# Patient Record
Sex: Female | Born: 1975 | Hispanic: Yes | Smoking: Current every day smoker
Health system: Southern US, Community
[De-identification: ages and names within clinical notes are randomized; demographics above are authoritative.]

## PROBLEM LIST (undated history)

## (undated) ENCOUNTER — Emergency Department (HOSPITAL_COMMUNITY): Admission: EM

## (undated) DIAGNOSIS — H409 Unspecified glaucoma: Secondary | ICD-10-CM

## (undated) DIAGNOSIS — F32A Depression, unspecified: Secondary | ICD-10-CM

## (undated) DIAGNOSIS — I10 Essential (primary) hypertension: Secondary | ICD-10-CM

## (undated) DIAGNOSIS — Z87442 Personal history of urinary calculi: Secondary | ICD-10-CM

## (undated) DIAGNOSIS — F319 Bipolar disorder, unspecified: Secondary | ICD-10-CM

## (undated) DIAGNOSIS — F419 Anxiety disorder, unspecified: Secondary | ICD-10-CM

## (undated) DIAGNOSIS — G43909 Migraine, unspecified, not intractable, without status migrainosus: Secondary | ICD-10-CM

## (undated) DIAGNOSIS — K219 Gastro-esophageal reflux disease without esophagitis: Secondary | ICD-10-CM

## (undated) DIAGNOSIS — R011 Cardiac murmur, unspecified: Secondary | ICD-10-CM

## (undated) DIAGNOSIS — F209 Schizophrenia, unspecified: Secondary | ICD-10-CM

## (undated) DIAGNOSIS — J302 Other seasonal allergic rhinitis: Secondary | ICD-10-CM

## (undated) HISTORY — DX: Essential (primary) hypertension: I10

## (undated) HISTORY — DX: Gastro-esophageal reflux disease without esophagitis: K21.9

## (undated) HISTORY — DX: Unspecified glaucoma: H40.9

## (undated) HISTORY — PX: TUBAL LIGATION: SHX77

## (undated) HISTORY — PX: WISDOM TOOTH EXTRACTION: SHX21

## (undated) HISTORY — DX: Depression, unspecified: F32.A

## (undated) HISTORY — PX: OTHER SURGICAL HISTORY: SHX169

## (undated) HISTORY — DX: Other seasonal allergic rhinitis: J30.2

---

## 1998-03-05 DIAGNOSIS — Z5189 Encounter for other specified aftercare: Secondary | ICD-10-CM

## 1998-03-05 HISTORY — DX: Encounter for other specified aftercare: Z51.89

## 2004-03-05 HISTORY — PX: WRIST SURGERY: SHX841

## 2005-09-27 ENCOUNTER — Ambulatory Visit: Payer: Self-pay | Admitting: Unknown Physician Specialty

## 2005-10-29 ENCOUNTER — Ambulatory Visit: Payer: Self-pay | Admitting: Emergency Medicine

## 2005-10-29 ENCOUNTER — Emergency Department: Payer: Self-pay | Admitting: Emergency Medicine

## 2006-05-15 ENCOUNTER — Emergency Department: Payer: Self-pay | Admitting: Emergency Medicine

## 2006-08-01 ENCOUNTER — Emergency Department: Payer: Self-pay | Admitting: Emergency Medicine

## 2007-03-03 ENCOUNTER — Other Ambulatory Visit: Payer: Self-pay

## 2007-03-03 ENCOUNTER — Inpatient Hospital Stay: Payer: Self-pay | Admitting: Internal Medicine

## 2007-03-04 ENCOUNTER — Inpatient Hospital Stay: Payer: Self-pay | Admitting: Unknown Physician Specialty

## 2007-09-27 ENCOUNTER — Emergency Department: Payer: Self-pay | Admitting: Emergency Medicine

## 2007-11-04 ENCOUNTER — Emergency Department: Payer: Self-pay | Admitting: Emergency Medicine

## 2007-11-24 ENCOUNTER — Emergency Department (HOSPITAL_COMMUNITY): Admission: EM | Admit: 2007-11-24 | Discharge: 2007-11-24 | Payer: Self-pay | Admitting: Emergency Medicine

## 2008-11-09 ENCOUNTER — Emergency Department: Payer: Self-pay | Admitting: Emergency Medicine

## 2010-06-03 ENCOUNTER — Emergency Department (HOSPITAL_COMMUNITY)
Admission: EM | Admit: 2010-06-03 | Discharge: 2010-06-03 | Discharge status: Against Medical Advice | Payer: Medicaid Other | Attending: Emergency Medicine | Admitting: Emergency Medicine

## 2010-06-03 DIAGNOSIS — IMO0001 Reserved for inherently not codable concepts without codable children: Secondary | ICD-10-CM | POA: Insufficient documentation

## 2010-06-03 DIAGNOSIS — R509 Fever, unspecified: Secondary | ICD-10-CM | POA: Insufficient documentation

## 2011-04-11 ENCOUNTER — Emergency Department: Payer: Self-pay | Admitting: Unknown Physician Specialty

## 2012-05-14 ENCOUNTER — Encounter (HOSPITAL_COMMUNITY): Payer: Self-pay

## 2012-05-14 ENCOUNTER — Emergency Department (HOSPITAL_COMMUNITY)
Admission: EM | Admit: 2012-05-14 | Discharge: 2012-05-14 | Disposition: A | Discharge status: Home / Self Care | Payer: Medicaid Other | Attending: Emergency Medicine | Admitting: Emergency Medicine

## 2012-05-14 DIAGNOSIS — Z8679 Personal history of other diseases of the circulatory system: Secondary | ICD-10-CM | POA: Insufficient documentation

## 2012-05-14 DIAGNOSIS — H53149 Visual discomfort, unspecified: Secondary | ICD-10-CM | POA: Insufficient documentation

## 2012-05-14 DIAGNOSIS — R51 Headache: Secondary | ICD-10-CM | POA: Insufficient documentation

## 2012-05-14 DIAGNOSIS — R110 Nausea: Secondary | ICD-10-CM | POA: Insufficient documentation

## 2012-05-14 DIAGNOSIS — Z8659 Personal history of other mental and behavioral disorders: Secondary | ICD-10-CM | POA: Insufficient documentation

## 2012-05-14 DIAGNOSIS — F17200 Nicotine dependence, unspecified, uncomplicated: Secondary | ICD-10-CM | POA: Insufficient documentation

## 2012-05-14 DIAGNOSIS — R519 Headache, unspecified: Secondary | ICD-10-CM

## 2012-05-14 HISTORY — DX: Schizophrenia, unspecified: F20.9

## 2012-05-14 HISTORY — DX: Anxiety disorder, unspecified: F41.9

## 2012-05-14 HISTORY — DX: Bipolar disorder, unspecified: F31.9

## 2012-05-14 HISTORY — DX: Migraine, unspecified, not intractable, without status migrainosus: G43.909

## 2012-05-14 MED ORDER — KETOROLAC TROMETHAMINE 30 MG/ML IJ SOLN
30.0000 mg | Freq: Once | INTRAMUSCULAR | Status: AC
  Administered 2012-05-14: 30 mg via INTRAMUSCULAR
  Filled 2012-05-14: qty 1

## 2012-05-14 MED ORDER — ONDANSETRON 8 MG PO TBDP
8.0000 mg | ORAL_TABLET | Freq: Once | ORAL | Status: AC
  Administered 2012-05-14: 8 mg via ORAL
  Filled 2012-05-14: qty 1

## 2012-05-14 MED ORDER — SUMATRIPTAN SUCCINATE 100 MG PO TABS: ORAL_TABLET | ORAL | Status: DC

## 2012-05-14 MED ORDER — OXYCODONE-ACETAMINOPHEN 5-325 MG PO TABS
1.0000 | ORAL_TABLET | Freq: Once | ORAL | Status: AC
Start: 2012-05-14 — End: 2012-05-14
  Administered 2012-05-14: 1 via ORAL
  Filled 2012-05-14: qty 1

## 2012-05-14 NOTE — ED Notes (Signed)
Pt alert & oriented x4, stable gait. Patient given discharge instructions, paperwork & prescription(s). Patient  instructed to stop at the registration desk to finish any additional paperwork. Patient verbalized understanding. Pt left department w/ no further questions. 

## 2012-05-14 NOTE — ED Provider Notes (Signed)
History     CSN: [id number]  Arrival date & time 05/14/12  1639   First MD Initiated Contact with Patient 05/14/12 1719      Chief Complaint  Patient presents with  . Headache    (Consider location/radiation/quality/duration/timing/severity/associated sxs/prior Treatment)  Patient is a 37 y.o. female presenting with headaches. The history is provided by the patient.  Headache Quality:  Stabbing Radiates to:  Does not radiate Severity currently:  8/10 Severity at highest:  8/10 Onset quality:  Gradual Timing:  Constant Progression:  Unchanged Chronicity:  New Similar to prior headaches: yes   Relieved by:  Nothing Worsened by:  Light and sound Ineffective treatments:  Aspirin Associated symptoms: nausea and photophobia   Associated symptoms: no abdominal pain, no congestion, no dizziness, no near-syncope, no neck pain and no sinus pressure    she has a history of migraine headaches and has had Imitrex in the past but doesn't have insurance now so has no medication. The headache started 3 days ago. The headache is located on the right side of the head around the right eye.    Past Medical History  Diagnosis Date  . Migraines   . Bipolar 1 disorder   . Anxiety   . Schizophrenia     Past Surgical History  Procedure Laterality Date  . Arm surgery    . Tubal ligation      No family history on file.  History  Substance Use Topics  . Smoking status: Current Every Day Smoker  . Smokeless tobacco: Not on file  . Alcohol Use: No    OB History   Grav Para Term Preterm Abortions TAB SAB Ect Mult Living                  Review of Systems  HENT: Negative for congestion, neck pain and sinus pressure.   Eyes: Positive for photophobia and visual disturbance.  Respiratory: Negative for shortness of breath.   Cardiovascular: Negative for chest pain, palpitations and near-syncope.  Gastrointestinal: Positive for nausea. Negative for abdominal pain.  Neurological:  Positive for headaches. Negative for dizziness, speech difficulty and light-headedness.    Allergies  Wellbutrin and Zoloft  Home Medications  No current outpatient prescriptions on file.  BP 117/70  Pulse 75  Temp(Src) 98.1 F (36.7 C) (Oral)  Resp 18  Ht 5\' 3"  (1.6 m)  Wt 167 lb 2 oz (75.807 kg)  BMI 29.61 kg/m2  SpO2 98%  LMP 05/05/2012  Physical Exam  Vitals reviewed. Constitutional: She is oriented to person, place, and time. She appears well-developed and well-nourished. No distress.  Patient sitting in a chair in exam room watching TV and talking with her son.  Appears comfortable.  HENT:  Head: Normocephalic and atraumatic.    Right Ear: Tympanic membrane normal.  Left Ear: Tympanic membrane normal.  Nose: Nose normal.  Mouth/Throat: Uvula is midline, oropharynx is clear and moist and mucous membranes are normal.  Headache over right eye  Eyes: Conjunctivae and EOM are normal. Pupils are equal, round, and reactive to light.  Neck: Normal range of motion. Neck supple.  Cardiovascular: Normal rate and regular rhythm.   Pulmonary/Chest: Effort normal and breath sounds normal.  Musculoskeletal: Normal range of motion.  Neurological: She is alert and oriented to person, place, and time. She has normal strength and normal reflexes. No cranial nerve deficit or sensory deficit. She displays a negative Romberg sign. Coordination and gait normal.  Skin: Skin is warm and dry.  Psychiatric: She has a normal mood and affect. Her behavior is normal. Judgment and thought content normal.   Assessment: 37 y.o. female with headache  Plan:  Percocet 5/325 mg PO   Zofran 8 mg ODT  Patient felt some better after medication headache decreased to 5/10. Will give Toradol 30 mg. IM     ED Course  Procedures  MDM 19:20 reevaluation after Toradol patient feeling much better. Headache 1/10 and she states she is ready to go home.  She would like Rx for her Imitrex. Since the patient  states this is similar to other headaches and she has no neurological deficits I will d/c her home with Rx for Imitrex tablets and encourage her to follow up with a primary care doctor. I have discussed findings with the patient and need for follow up.   [facility] Orlene Och, [place] 05/14/12 1925

## 2012-05-14 NOTE — ED Notes (Signed)
Pt c/o migraine headache and nausea x 3 days.  Reports history of migraines.

## 2012-05-14 NOTE — ED Provider Notes (Signed)
Medical screening examination/treatment/procedure(s) were performed by non-physician practitioner and as supervising physician I was immediately available for consultation/collaboration. Devoria Albe, MD, Armando Gang    Ward Givens, MD 05/14/12 [phone]

## 2012-06-27 DIAGNOSIS — R51 Headache: Secondary | ICD-10-CM | POA: Insufficient documentation

## 2012-06-27 DIAGNOSIS — F411 Generalized anxiety disorder: Secondary | ICD-10-CM | POA: Insufficient documentation

## 2012-06-27 DIAGNOSIS — F319 Bipolar disorder, unspecified: Secondary | ICD-10-CM | POA: Insufficient documentation

## 2012-06-27 DIAGNOSIS — F17200 Nicotine dependence, unspecified, uncomplicated: Secondary | ICD-10-CM | POA: Insufficient documentation

## 2012-06-27 DIAGNOSIS — F209 Schizophrenia, unspecified: Secondary | ICD-10-CM | POA: Insufficient documentation

## 2012-06-27 DIAGNOSIS — R112 Nausea with vomiting, unspecified: Secondary | ICD-10-CM | POA: Insufficient documentation

## 2012-06-27 DIAGNOSIS — Z79899 Other long term (current) drug therapy: Secondary | ICD-10-CM | POA: Insufficient documentation

## 2012-06-28 ENCOUNTER — Encounter (HOSPITAL_COMMUNITY): Payer: Self-pay

## 2012-06-28 ENCOUNTER — Emergency Department (HOSPITAL_COMMUNITY)
Admission: EM | Admit: 2012-06-28 | Discharge: 2012-06-28 | Disposition: A | Discharge status: Home / Self Care | Payer: Medicaid Other | Attending: Emergency Medicine | Admitting: Emergency Medicine

## 2012-06-28 DIAGNOSIS — R51 Headache: Secondary | ICD-10-CM

## 2012-06-28 MED ORDER — ONDANSETRON 8 MG PO TBDP
8.0000 mg | ORAL_TABLET | Freq: Once | ORAL | Status: AC
  Administered 2012-06-28: 8 mg via ORAL
  Filled 2012-06-28: qty 1

## 2012-06-28 MED ORDER — KETOROLAC TROMETHAMINE 60 MG/2ML IM SOLN
60.0000 mg | Freq: Once | INTRAMUSCULAR | Status: AC
  Administered 2012-06-28: 60 mg via INTRAMUSCULAR
  Filled 2012-06-28: qty 2

## 2012-06-28 MED ORDER — OXYCODONE-ACETAMINOPHEN 5-325 MG PO TABS
1.0000 | ORAL_TABLET | Freq: Once | ORAL | Status: AC
  Administered 2012-06-28: 1 via ORAL
  Filled 2012-06-28: qty 1

## 2012-06-28 MED ORDER — KETOROLAC TROMETHAMINE 30 MG/ML IJ SOLN: 60.0000 mg | Freq: Once | INTRAMUSCULAR | Status: DC

## 2012-06-28 NOTE — ED Notes (Signed)
Seen here last month for migraine and rx w/imitrex. Pt took same for recurrent migraine yesterday and had allergic rx to it. Has not taken any other meds since. Here now c/o of usual migraine headache

## 2012-06-28 NOTE — ED Provider Notes (Signed)
History     CSN: [id number]  Arrival date & time 06/27/12  2356   First MD Initiated Contact with Patient 06/28/12 0205      Chief Complaint  Patient presents with  . Migraine    (Consider location/radiation/quality/duration/timing/severity/associated sxs/prior treatment) HPI Stacy Wolf is a 37 y.o. female who presents to the Emergency Department complaining of headache that she has had intermittently for a month and usually treats with imitrex. She was given a prescription for imitrex and took it yesterday. It made her feel like she couldn't breathe. She has another headache now to the right side of her head and behind her right eye. It is characteristic of her headaches. She is nauseated. She denies fever, chills, difficulty swallowing or talking, stiff neck, chest pain, shortness of breath.   Past Medical History  Diagnosis Date  . Migraines   . Bipolar 1 disorder   . Anxiety   . Schizophrenia     Past Surgical History  Procedure Laterality Date  . Arm surgery    . Tubal ligation      No family history on file.  History  Substance Use Topics  . Smoking status: Current Every Day Smoker  . Smokeless tobacco: Not on file  . Alcohol Use: No    OB History   Grav Para Term Preterm Abortions TAB SAB Ect Mult Living                  Review of Systems  Constitutional: Negative for fever.       10 Systems reviewed and are negative for acute change except as noted in the HPI.  HENT: Negative for congestion.   Eyes: Negative for discharge and redness.  Respiratory: Negative for cough and shortness of breath.   Cardiovascular: Negative for chest pain.  Gastrointestinal: Positive for nausea and vomiting. Negative for abdominal pain.  Musculoskeletal: Negative for back pain.  Skin: Negative for rash.  Neurological: Positive for headaches. Negative for syncope and numbness.  Psychiatric/Behavioral:       No behavior change.    Allergies  Imitrex; Wellbutrin; and  Zoloft  Home Medications   Current Outpatient Rx  Name  Route  Sig  Dispense  Refill  . FLUoxetine (PROZAC) 10 MG capsule   Oral   Take 20 mg by mouth daily.          . SUMAtriptan (IMITREX) 100 MG tablet      Take one tablet at onset of migraine. May repeat in 2 hours if needed.   10 tablet   0   . QUEtiapine (SEROQUEL) 100 MG tablet   Oral   Take 100 mg by mouth 2 (two) times daily.           BP 108/70  Pulse 58  Temp(Src) 97 F (36.1 C) (Oral)  Resp 16  Ht 5\' 3"  (1.6 m)  Wt 165 lb (74.844 kg)  BMI 29.24 kg/m2  SpO2 100%  LMP 06/25/2012  Physical Exam  Nursing note and vitals reviewed. Constitutional: She appears well-developed and well-nourished.  Awake, alert, nontoxic appearance.  HENT:  Head: Normocephalic and atraumatic.  Right Ear: External ear normal.  Left Ear: External ear normal.  Mouth/Throat: Oropharynx is clear and moist.  Eyes: EOM are normal. Pupils are equal, round, and reactive to light.  Neck: Normal range of motion. Neck supple.  Cardiovascular: Normal rate and intact distal pulses.   Pulmonary/Chest: Effort normal and breath sounds normal. She exhibits no tenderness.  Abdominal:  Soft. Bowel sounds are normal. There is no tenderness. There is no rebound.  Musculoskeletal: She exhibits no tenderness.  Baseline ROM, no obvious new focal weakness.  Neurological:  Mental status and motor strength appears baseline for patient and situation.  Skin: No rash noted.  Psychiatric: She has a normal mood and affect.    ED Course  Procedures (including critical care time)    1. Headache    Medications  oxyCODONE-acetaminophen (PERCOCET/ROXICET) 5-325 MG per tablet 1 tablet (1 tablet Oral Given 06/28/12 0218)  ondansetron (ZOFRAN-ODT) disintegrating tablet 8 mg (8 mg Oral Given 06/28/12 0217)  ketorolac (TORADOL) injection 60 mg (60 mg Intramuscular Given 06/28/12 0218)     MDM  Patient here with headache and nausea. Given percocet,  zofran, and toradol with improvement. Patient discharged home. Pt stable in ED with no significant deterioration in condition.The patient appears reasonably screened and/or stabilized for discharge and I doubt any other medical condition or other [employer] [facility] requiring further screening, evaluation, or treatment in the ED at this time prior to discharge.  MDM Reviewed: nursing note and vitals           Nicoletta Dress. Colon Branch, MD 06/28/12 [phone]

## 2013-05-26 ENCOUNTER — Emergency Department (HOSPITAL_COMMUNITY)
Admission: EM | Admit: 2013-05-26 | Discharge: 2013-05-26 | Disposition: A | Discharge status: Home / Self Care | Payer: Medicare Other | Attending: Emergency Medicine | Admitting: Emergency Medicine

## 2013-05-26 ENCOUNTER — Encounter (HOSPITAL_COMMUNITY): Payer: Self-pay | Admitting: Emergency Medicine

## 2013-05-26 DIAGNOSIS — F319 Bipolar disorder, unspecified: Secondary | ICD-10-CM | POA: Insufficient documentation

## 2013-05-26 DIAGNOSIS — Y9389 Activity, other specified: Secondary | ICD-10-CM | POA: Insufficient documentation

## 2013-05-26 DIAGNOSIS — X500XXA Overexertion from strenuous movement or load, initial encounter: Secondary | ICD-10-CM | POA: Insufficient documentation

## 2013-05-26 DIAGNOSIS — F411 Generalized anxiety disorder: Secondary | ICD-10-CM | POA: Insufficient documentation

## 2013-05-26 DIAGNOSIS — S39012A Strain of muscle, fascia and tendon of lower back, initial encounter: Secondary | ICD-10-CM

## 2013-05-26 DIAGNOSIS — F17200 Nicotine dependence, unspecified, uncomplicated: Secondary | ICD-10-CM | POA: Insufficient documentation

## 2013-05-26 DIAGNOSIS — S336XXA Sprain of sacroiliac joint, initial encounter: Secondary | ICD-10-CM | POA: Insufficient documentation

## 2013-05-26 DIAGNOSIS — Y929 Unspecified place or not applicable: Secondary | ICD-10-CM | POA: Insufficient documentation

## 2013-05-26 DIAGNOSIS — X503XXA Overexertion from repetitive movements, initial encounter: Secondary | ICD-10-CM | POA: Insufficient documentation

## 2013-05-26 DIAGNOSIS — G43909 Migraine, unspecified, not intractable, without status migrainosus: Secondary | ICD-10-CM | POA: Insufficient documentation

## 2013-05-26 DIAGNOSIS — Z8659 Personal history of other mental and behavioral disorders: Secondary | ICD-10-CM | POA: Insufficient documentation

## 2013-05-26 DIAGNOSIS — Z79899 Other long term (current) drug therapy: Secondary | ICD-10-CM | POA: Insufficient documentation

## 2013-05-26 MED ORDER — METHOCARBAMOL 500 MG PO TABS
1000.0000 mg | ORAL_TABLET | Freq: Once | ORAL | Status: AC
  Administered 2013-05-26: 1000 mg via ORAL
  Filled 2013-05-26: qty 2

## 2013-05-26 MED ORDER — HYDROCODONE-ACETAMINOPHEN 5-325 MG PO TABS: 1.0000 | ORAL_TABLET | Freq: Four times a day (QID) | ORAL | Status: DC | PRN

## 2013-05-26 MED ORDER — PREDNISONE 50 MG PO TABS
60.0000 mg | ORAL_TABLET | Freq: Once | ORAL | Status: AC
  Administered 2013-05-26: 60 mg via ORAL
  Filled 2013-05-26 (×2): qty 1

## 2013-05-26 MED ORDER — ONDANSETRON HCL 4 MG PO TABS
4.0000 mg | ORAL_TABLET | Freq: Once | ORAL | Status: AC
  Administered 2013-05-26: 4 mg via ORAL
  Filled 2013-05-26: qty 1

## 2013-05-26 MED ORDER — DICLOFENAC SODIUM 75 MG PO TBEC: 75.0000 mg | DELAYED_RELEASE_TABLET | Freq: Two times a day (BID) | ORAL | Status: DC

## 2013-05-26 MED ORDER — METHOCARBAMOL 500 MG PO TABS
500.0000 mg | ORAL_TABLET | Freq: Three times a day (TID) | ORAL | Status: DC
Start: 2013-05-26 — End: 2013-08-25

## 2013-05-26 MED ORDER — KETOROLAC TROMETHAMINE 10 MG PO TABS
10.0000 mg | ORAL_TABLET | Freq: Once | ORAL | Status: AC
  Administered 2013-05-26: 10 mg via ORAL
  Filled 2013-05-26: qty 1

## 2013-05-26 NOTE — ED Notes (Signed)
Pt reporting sharp pain in back since Friday, no improvement with Aleve.

## 2013-05-26 NOTE — ED Provider Notes (Signed)
CSN: [id number]     Arrival date & time 05/26/13  1950 History   First MD Initiated Contact with Patient 05/26/13 2122     Chief Complaint  Patient presents with  . Back Pain     (Consider location/radiation/quality/duration/timing/severity/associated sxs/prior Treatment) HPI Comments: Patient is a 38 year old female who presents to the emergency department with back pain. The patient states she has occasional pain in her back. She states this usually will respond to Aleve. Recently she was helping her son with a project and Stacy Wolf some lifting and stretching. The next day the patient went to get out of bed and had severe pain in the lower back. The patient has been taking Aleve since Friday, March 20. Patient states the pain seems to be getting worse instead of better. She's not had any loss of bowel or bladder function. There's been no previous operations or procedures involving the back.  Patient is a 38 y.o. female presenting with back pain. The history is provided by the patient.  Back Pain Associated symptoms: headaches   Associated symptoms: no abdominal pain, no chest pain and no dysuria     Past Medical History  Diagnosis Date  . Migraines   . Bipolar 1 disorder   . Anxiety   . Schizophrenia    Past Surgical History  Procedure Laterality Date  . Arm surgery    . Tubal ligation     No family history on file. History  Substance Use Topics  . Smoking status: Current Every Day Smoker  . Smokeless tobacco: Not on file  . Alcohol Use: No   OB History   Grav Para Term Preterm Abortions TAB SAB Ect Mult Living                 Review of Systems  Constitutional: Negative for activity change.       All ROS Neg except as noted in HPI  HENT: Negative for nosebleeds.   Eyes: Negative for photophobia and discharge.  Respiratory: Negative for cough, shortness of breath and wheezing.   Cardiovascular: Negative for chest pain and palpitations.  Gastrointestinal: Negative for  abdominal pain and blood in stool.  Genitourinary: Negative for dysuria, frequency and hematuria.  Musculoskeletal: Positive for back pain. Negative for arthralgias and neck pain.  Skin: Negative.   Neurological: Positive for headaches. Negative for dizziness, seizures and speech difficulty.  Psychiatric/Behavioral: Negative for hallucinations and confusion.      Allergies  Imitrex; Wellbutrin; and Zoloft  Home Medications   Current Outpatient Rx  Name  Route  Sig  Dispense  Refill  . FLUoxetine (PROZAC) 10 MG capsule   Oral   Take 20 mg by mouth daily.          . QUEtiapine (SEROQUEL) 100 MG tablet   Oral   Take 100 mg by mouth 2 (two) times daily.         . SUMAtriptan (IMITREX) 100 MG tablet      Take one tablet at onset of migraine. May repeat in 2 hours if needed.   10 tablet   0    BP 143/81  Pulse 66  Temp(Src) 98 F (36.7 C) (Oral)  Resp 20  Ht 5\' 3"  (1.6 m)  Wt 188 lb (85.276 kg)  BMI 33.31 kg/m2  SpO2 99% Physical Exam  Nursing note and vitals reviewed. Constitutional: She is oriented to person, place, and time. She appears well-developed and well-nourished.  Non-toxic appearance.  HENT:  Head: Normocephalic.  Right  Ear: Tympanic membrane and external ear normal.  Left Ear: Tympanic membrane and external ear normal.  Eyes: EOM and lids are normal. Pupils are equal, round, and reactive to light.  Neck: Normal range of motion. Neck supple. Carotid bruit is not present.  Cardiovascular: Normal rate, regular rhythm, normal heart sounds, intact distal pulses and normal pulses.   Pulmonary/Chest: Breath sounds normal. No respiratory distress.  Abdominal: Soft. Bowel sounds are normal. There is no tenderness. There is no guarding.  Musculoskeletal: Normal range of motion.  There is lower lumbar paraspinal tenderness extending to the left buttocks cheek. The pain is aggravated by palpation and also certain ranges of motion. There no hot areas appreciated.  There is no palpable step off.  Lymphadenopathy:       Head (right side): No submandibular adenopathy present.       Head (left side): No submandibular adenopathy present.    She has no cervical adenopathy.  Neurological: She is alert and oriented to person, place, and time. She has normal strength. No cranial nerve deficit or sensory deficit.  No gross neurologic deficits appreciated.  Skin: Skin is warm and dry.  Psychiatric: She has a normal mood and affect. Her speech is normal.    ED Course  Procedures (including critical care time) Labs Review Labs Reviewed - No data to display Imaging Review No results found.   EKG Interpretation None      MDM Patient presents to the emergency department with a history of sharp pain in the lower back since March 20. There is no neurologic deficit appreciated at this time. The plan at this time is for the patient to receive a prescription for Robaxin, diclofenac, and norco.   Final diagnoses:  None    *I have reviewed nursing notes, vital signs, and all appropriate lab and imaging results for this patient.Lenox Ahr, PA-C 05/26/13 2213

## 2013-05-26 NOTE — ED Provider Notes (Signed)
Medical screening examination/treatment/procedure(s) were performed by non-physician practitioner and as supervising physician I was immediately available for consultation/collaboration.   EKG Interpretation None        Ezequiel Essex, MD 05/26/13 2337

## 2013-05-26 NOTE — Discharge Instructions (Signed)
Muscle Strain  A muscle strain (pulled muscle) happens when a muscle is stretched beyond normal length. It happens when a sudden, violent force stretches your muscle too far. Usually, a few of the fibers in your muscle are torn. Muscle strain is common in athletes. Recovery usually takes 1 2 weeks. Complete healing takes 5 6 weeks.   HOME CARE    Follow the PRICE method of treatment to help your injury get better. Do this the first 2 3 days after the injury:   Protect. Protect the muscle to keep it from getting injured again.   Rest. Limit your activity and rest the injured body part.   Ice. Put ice in a plastic bag. Place a towel between your skin and the bag. Then, apply the ice and leave it on from 15 20 minutes each hour. After the third day, switch to moist heat packs.   Compression. Use a splint or elastic bandage on the injured area for comfort. Do not put it on too tightly.   Elevate. Keep the injured body part above the level of your heart.   Only take medicine as told by your doctor.   Warm up before doing exercise to prevent future muscle strains.  GET HELP IF:    You have more pain or puffiness (swelling) in the injured area.   You feel numbness, tingling, or notice a loss of strength in the injured area.  MAKE SURE YOU:    Understand these instructions.   Will watch your condition.   Will get help right away if you are not doing well or get worse.  Document Released: 11/29/2007 Document Revised: 12/10/2012 Document Reviewed: 09/18/2012  [employer] Patient Information 2014 [employer].

## 2013-05-29 ENCOUNTER — Telehealth: Payer: Self-pay | Admitting: Orthopedic Surgery

## 2013-05-29 NOTE — Telephone Encounter (Signed)
Patient called following Emergency Room visit at [facility] on 05/26/13, for lower back pain/strain, no injury.  States has still been hurting.  No Xray was done there at that time.  She wishes to wait and call back at this point, as she is re-checking her insurance; thinks she also has Medicare, part A & B, and "regular" Medicaid.  Her ph# is [phone]

## 2013-06-23 NOTE — Telephone Encounter (Signed)
No further response from patient. °

## 2013-08-13 ENCOUNTER — Telehealth: Payer: Self-pay | Admitting: Family Medicine

## 2013-08-14 NOTE — Telephone Encounter (Signed)
Patient was seen at [facility] and they ordered her labs and told her she was borderline diabetic and elevated cholesterol. Patient was unable to tell me what medications she is on at this time and states that she will call us back with her list this afternoon.

## 2013-08-17 ENCOUNTER — Telehealth: Payer: Self-pay | Admitting: Family Medicine

## 2013-08-19 NOTE — Telephone Encounter (Signed)
appt scheduled

## 2013-08-25 ENCOUNTER — Encounter: Payer: Self-pay | Admitting: Family

## 2013-08-25 ENCOUNTER — Ambulatory Visit (INDEPENDENT_AMBULATORY_CARE_PROVIDER_SITE_OTHER): Payer: Medicare Other | Admitting: Family

## 2013-08-25 ENCOUNTER — Encounter (INDEPENDENT_AMBULATORY_CARE_PROVIDER_SITE_OTHER): Payer: Self-pay

## 2013-08-25 VITALS — BP 98/66 | HR 68 | Temp 98.8°F | Ht 63.0 in | Wt 180.0 lb

## 2013-08-25 DIAGNOSIS — Z0000 Encounter for general adult medical examination without abnormal findings: Secondary | ICD-10-CM

## 2013-08-25 DIAGNOSIS — Z23 Encounter for immunization: Secondary | ICD-10-CM

## 2013-08-25 DIAGNOSIS — Z124 Encounter for screening for malignant neoplasm of cervix: Secondary | ICD-10-CM

## 2013-08-25 DIAGNOSIS — Z01419 Encounter for gynecological examination (general) (routine) without abnormal findings: Secondary | ICD-10-CM

## 2013-08-25 DIAGNOSIS — F319 Bipolar disorder, unspecified: Secondary | ICD-10-CM

## 2013-08-25 LAB — POCT CBC
Granulocyte percent: 59.4 % (ref 37–80)
HCT, POC: 42.7 % (ref 37.7–47.9)
Hemoglobin: 13.7 g/dL (ref 12.2–16.2)
Lymph, poc: 2.6 (ref 0.6–3.4)
MCH, POC: 29.7 pg (ref 27–31.2)
MCHC: 32.2 g/dL (ref 31.8–35.4)
MCV: 92.4 fL (ref 80–97)
MPV: 7.5 fL (ref 0–99.8)
POC Granulocyte: 4 (ref 2–6.9)
POC LYMPH PERCENT: 37.6 % (ref 10–50)
Platelet Count, POC: 233 10*3/uL (ref 142–424)
RBC: 4.6 M/uL (ref 4.04–5.48)
RDW, POC: 13.5 %
WBC: 6.8 10*3/uL (ref 4.6–10.2)

## 2013-08-25 LAB — POCT URINALYSIS DIPSTICK
Bilirubin, UA: NEGATIVE
Glucose, UA: NEGATIVE
Ketones, UA: NEGATIVE
Nitrite, UA: POSITIVE
Spec Grav, UA: 1.025
Urobilinogen, UA: NEGATIVE
pH, UA: 6

## 2013-08-25 LAB — POCT UA - MICROSCOPIC ONLY
Casts, Ur, LPF, POC: NEGATIVE
Crystals, Ur, HPF, POC: NEGATIVE
MUCUS UA: NEGATIVE
RBC, URINE, MICROSCOPIC: 1.3
Yeast, UA: NEGATIVE

## 2013-08-25 NOTE — Progress Notes (Signed)
   Subjective:    Patient ID: Stacy Wolf, female    DOB: 1975-09-30, 38 y.o.   MRN: [id number]  HPI Pt presents to the office for annual physical with pap. Pt currently only takes haldol for Bipolar. Pt see's a psychiatrists in [place] who manages that. Pt had blood work drawn in the past that shown elevated blood sugar and high cholesterol. Pt has never been on medications for these in the past. Pt denies pain, SOB, or edema.    Review of Systems  Constitutional: Negative.   Respiratory: Negative.   Cardiovascular: Negative.   Gastrointestinal: Negative.   Genitourinary: Negative.   Musculoskeletal: Negative.   Neurological: Negative.   Hematological: Negative.   All other systems reviewed and are negative.      Objective:   Physical Exam  Vitals reviewed. Constitutional: She is oriented to person, place, and time. She appears well-developed and well-nourished. No distress.  HENT:  Head: Normocephalic and atraumatic.  Right Ear: External ear normal.  Mouth/Throat: Oropharynx is clear and moist.  Eyes: Pupils are equal, round, and reactive to light.  Neck: Normal range of motion. Neck supple. No thyromegaly present.  Cardiovascular: Normal rate, regular rhythm, normal heart sounds and intact distal pulses.   No murmur heard. Pulmonary/Chest: Effort normal and breath sounds normal. No respiratory distress. She has no wheezes.  Abdominal: Soft. Bowel sounds are normal. She exhibits no distension. There is no tenderness.  Musculoskeletal: Normal range of motion. She exhibits no edema and no tenderness.  Neurological: She is alert and oriented to person, place, and time. She has normal reflexes. No cranial nerve deficit.  Skin: Skin is warm and dry.  Psychiatric: She has a normal mood and affect. Her behavior is normal. Judgment and thought content normal.          Assessment & Plan:  1. Need for Tdap vaccination - Tdap vaccine greater than or equal to 7yo IM  2.  Bipolar disorder, unspecified  3. Annual physical exam - POCT CBC - CMP14+EGFR - Lipid panel - Vit D  25 hydroxy (rtn osteoporosis monitoring) - Pap IG (Image Guided) - POCT UA - Microscopic Only - POCT urinalysis dipstick   Continue all meds Labs pending Health Maintenance reviewed Diet and exercise encouraged RTO 1 year  Evelina Dun, FNP

## 2013-08-25 NOTE — Patient Instructions (Addendum)
Tetanus, Diphtheria, Pertussis (Tdap) Vaccine What You Need to Know WHY GET VACCINATED? Tetanus, diphtheria and pertussis can be very serious diseases, even for adolescents and adults. Tdap vaccine can protect [place] from these diseases. TETANUS (Lockjaw) causes painful muscle tightening and stiffness, usually all over the body.  It can lead to tightening of muscles in the head and neck so you can't open your mouth, swallow, or sometimes even breathe. Tetanus kills about 1 out of 5 people who are infected. DIPHTHERIA can cause a thick coating to form in the back of the throat.  It can lead to breathing problems, paralysis, heart failure, and death. PERTUSSIS (Whooping Cough) causes severe coughing spells, which can cause difficulty breathing, vomiting and disturbed sleep.  It can also lead to weight loss, incontinence, and rib fractures. Up to 2 in 100 adolescents and 5 in 100 adults with pertussis are hospitalized or have complications, which could include pneumonia and death. These diseases are caused by bacteria. Diphtheria and pertussis are spread from person to person through coughing or sneezing. Tetanus enters the body through cuts, scratches, or wounds. Before vaccines, the [place] saw as many as 200,000 cases a year of diphtheria and pertussis, and hundreds of cases of tetanus. Since vaccination began, tetanus and diphtheria have dropped by about 99% and pertussis by about 80%. TDAP VACCINE Tdap vaccine can protect adolescents and adults from tetanus, diphtheria, and pertussis. One dose of Tdap is routinely given at age 36 or 66. People who did not get Tdap at that age should get it as soon as possible. Tdap is especially important for health care professionals and anyone having close contact with a baby younger than 12 months. Pregnant women should get a dose of Tdap during every pregnancy, to protect the newborn from pertussis. Infants are most at risk for severe, life-threatening  complications from pertussis. A similar vaccine, called Td, protects from tetanus and diphtheria, but not pertussis. A Td booster should be given every 10 years. Tdap may be given as one of these boosters if you have not already gotten a dose. Tdap may also be given after a severe cut or burn to prevent tetanus infection. Your doctor can give you more information. Tdap may safely be given at the same time as other vaccines. SOME PEOPLE SHOULD NOT GET THIS VACCINE  If you ever had a life-threatening allergic reaction after a dose of any tetanus, diphtheria, or pertussis containing vaccine, OR if you have a severe allergy to any part of this vaccine, you should not get Tdap. Tell your doctor if you have any severe allergies.  If you had a coma, or long or multiple seizures within 7 days after a childhood dose of DTP or DTaP, you should not get Tdap, unless a cause other than the vaccine was found. You can still get Td.  Talk to your doctor if you:  have epilepsy or another nervous system problem,  had severe pain or swelling after any vaccine containing diphtheria, tetanus or pertussis,  ever had Guillain-Barr Syndrome (GBS),  aren't feeling well on the day the shot is scheduled. RISKS OF A VACCINE REACTION With any medicine, including vaccines, there is a chance of side effects. These are usually mild and go away on their own, but serious reactions are also possible. Brief fainting spells can follow a vaccination, leading to injuries from falling. Sitting or lying down for about 15 minutes can help prevent these. Tell your doctor if you feel dizzy or light-headed, or  have vision changes or ringing in the ears. Mild problems following Tdap (Did not interfere with activities)  Pain where the shot was given (about 3 in 4 adolescents or 2 in 3 adults)  Redness or swelling where the shot was given (about 1 person in 5)  Mild fever of at least 100.71F (up to about 1 in 25 adolescents or 1 in  100 adults)  Headache (about 3 or 4 people in 10)  Tiredness (about 1 person in 3 or 4)  Nausea, vomiting, diarrhea, stomach ache (up to 1 in 4 adolescents or 1 in 10 adults)  Chills, body aches, sore joints, rash, swollen glands (uncommon) Moderate problems following Tdap (Interfered with activities, but did not require medical attention)  Pain where the shot was given (about 1 in 5 adolescents or 1 in 100 adults)  Redness or swelling where the shot was given (up to about 1 in 16 adolescents or 1 in 25 adults)  Fever over 102F (about 1 in 100 adolescents or 1 in 250 adults)  Headache (about 3 in 20 adolescents or 1 in 10 adults)  Nausea, vomiting, diarrhea, stomach ache (up to 1 or 3 people in 100)  Swelling of the entire arm where the shot was given (up to about 3 in 100). Severe problems following Tdap (Unable to perform usual activities, required medical attention)  Swelling, severe pain, bleeding and redness in the arm where the shot was given (rare). A severe allergic reaction could occur after any vaccine (estimated less than 1 in a million doses). WHAT IF THERE IS A SERIOUS REACTION? What should I look for?  Look for anything that concerns you, such as signs of a severe allergic reaction, very high fever, or behavior changes. Signs of a severe allergic reaction can include hives, swelling of the face and throat, difficulty breathing, a fast heartbeat, dizziness, and weakness. These would start a few minutes to a few hours after the vaccination. What should I do?  If you think it is a severe allergic reaction or other emergency that can't wait, call 9-1-1 or get the person to the nearest hospital. Otherwise, call your doctor.  Afterward, the reaction should be reported to the "Vaccine Adverse Event Reporting System" (VAERS). Your doctor might file this report, or you can do it yourself through the VAERS web site at www.vaers.SamedayNews.es, or by calling 951-815-7115. VAERS is  only for reporting reactions. They do not give medical advice.  THE NATIONAL VACCINE INJURY COMPENSATION PROGRAM The National Vaccine Injury Compensation Program (VICP) is a federal program that was created to compensate people who may have been injured by certain vaccines. Persons who believe they may have been injured by a vaccine can learn about the program and about filing a claim by calling 805-032-2098 or visiting the [place] website at GoldCloset.com.ee. HOW CAN I LEARN MORE?  Ask your doctor.  Call your local or state health department.  Contact the Centers for Disease Control and Prevention (CDC):  Call 541-084-3575 or visit CDC's website at http://hunter.com/. CDC Tdap Vaccine VIS (07/12/11) Document Released: 08/21/2011 Document Revised: 06/16/2012 Document Reviewed: 06/11/2012 [employer] Patient Information 2015 [place], [place]. This information is not intended to replace advice given to you by your health care provider. Make sure you discuss any questions you have with your health care provider. Health Maintenance, Female A healthy lifestyle and preventative care can promote health and wellness.  Maintain regular health, dental, and eye exams.  Eat a healthy diet. Foods like vegetables, fruits, whole  grains, low-fat dairy products, and lean protein foods contain the nutrients you need without too many calories. Decrease your intake of foods high in solid fats, added sugars, and salt. Get information about a proper diet from your caregiver, if necessary.  Regular physical exercise is one of the most important things you can do for your health. Most adults should get at least 150 minutes of moderate-intensity exercise (any activity that increases your heart rate and causes you to sweat) each week. In addition, most adults need muscle-strengthening exercises on 2 or more days a week.   Maintain a healthy weight. The body mass index (BMI) is a screening tool to  identify possible weight problems. It provides an estimate of body fat based on height and weight. Your caregiver can help determine your BMI, and can help you achieve or maintain a healthy weight. For adults 20 years and older:  A BMI below 18.5 is considered underweight.  A BMI of 18.5 to 24.9 is normal.  A BMI of 25 to 29.9 is considered overweight.  A BMI of 30 and above is considered obese.  Maintain normal blood lipids and cholesterol by exercising and minimizing your intake of saturated fat. Eat a balanced diet with plenty of fruits and vegetables. Blood tests for lipids and cholesterol should begin at age 18 and be repeated every 5 years. If your lipid or cholesterol levels are high, you are over 50, or you are a high risk for heart disease, you may need your cholesterol levels checked more frequently.Ongoing high lipid and cholesterol levels should be treated with medicines if diet and exercise are not effective.  If you smoke, find out from your caregiver how to quit. If you do not use tobacco, do not start.  Lung cancer screening is recommended for adults aged 42-80 years who are at high risk for developing lung cancer because of a history of smoking. Yearly low-dose computed tomography (CT) is recommended for people who have at least a 30-pack-year history of smoking and are a current smoker or have quit within the past 15 years. A pack year of smoking is smoking an average of 1 pack of cigarettes a day for 1 year (for example: 1 pack a day for 30 years or 2 packs a day for 15 years). Yearly screening should continue until the smoker has stopped smoking for at least 15 years. Yearly screening should also be stopped for people who develop a health problem that would prevent them from having lung cancer treatment.  If you are pregnant, do not drink alcohol. If you are breastfeeding, be very cautious about drinking alcohol. If you are not pregnant and choose to drink alcohol, do not exceed  1 drink per day. One drink is considered to be 12 ounces (355 mL) of beer, 5 ounces (148 mL) of wine, or 1.5 ounces (44 mL) of liquor.  Avoid use of street drugs. Do not share needles with anyone. Ask for help if you need support or instructions about stopping the use of drugs.  High blood pressure causes heart disease and increases the risk of stroke. Blood pressure should be checked at least every 1 to 2 years. Ongoing high blood pressure should be treated with medicines, if weight loss and exercise are not effective.  If you are 1 to 39 years old, ask your caregiver if you should take aspirin to prevent strokes.  Diabetes screening involves taking a blood sample to check your fasting blood sugar level. This should be  done once every 3 years, after age 71, if you are within normal weight and without risk factors for diabetes. Testing should be considered at a younger age or be carried out more frequently if you are overweight and have at least 1 risk factor for diabetes.  Breast cancer screening is essential preventative care for women. You should practice "breast self-awareness." This means understanding the normal appearance and feel of your breasts and may include breast self-examination. Any changes detected, no matter how small, should be reported to a caregiver. Women in their 46s and 30s should have a clinical breast exam (CBE) by a caregiver as part of a regular health exam every 1 to 3 years. After age 19, women should have a CBE every year. Starting at age 65, women should consider having a mammogram (breast X-ray) every year. Women who have a family history of breast cancer should talk to their caregiver about genetic screening. Women at a high risk of breast cancer should talk to their caregiver about having an MRI and a mammogram every year.  Breast cancer gene (BRCA)-related cancer risk assessment is recommended for women who have family members with BRCA-related cancers. BRCA-related  cancers include breast, ovarian, tubal, and peritoneal cancers. Having family members with these cancers may be associated with an increased risk for harmful changes (mutations) in the breast cancer genes BRCA1 and BRCA2. Results of the assessment will determine the need for genetic counseling and BRCA1 and BRCA2 testing.  The Pap test is a screening test for cervical cancer. Women should have a Pap test starting at age 40. Between ages 41 and 67, Pap tests should be repeated every 2 years. Beginning at age 24, you should have a Pap test every 3 years as long as the past 3 Pap tests have been normal. If you had a hysterectomy for a problem that was not cancer or a condition that could lead to cancer, then you no longer need Pap tests. If you are between ages 68 and 58, and you have had normal Pap tests going back 10 years, you no longer need Pap tests. If you have had past treatment for cervical cancer or a condition that could lead to cancer, you need Pap tests and screening for cancer for at least 20 years after your treatment. If Pap tests have been discontinued, risk factors (such as a new sexual partner) need to be reassessed to determine if screening should be resumed. Some women have medical problems that increase the chance of getting cervical cancer. In these cases, your caregiver may recommend more frequent screening and Pap tests.  The human papillomavirus (HPV) test is an additional test that may be used for cervical cancer screening. The HPV test looks for the virus that can cause the cell changes on the cervix. The cells collected during the Pap test can be tested for HPV. The HPV test could be used to screen women aged 51 years and older, and should be used in women of any age who have unclear Pap test results. After the age of 66, women should have HPV testing at the same frequency as a Pap test.  Colorectal cancer can be detected and often prevented. Most routine colorectal cancer screening  begins at the age of 73 and continues through age 21. However, your caregiver may recommend screening at an earlier age if you have risk factors for colon cancer. On a yearly basis, your caregiver may provide home test kits to check for hidden blood in  the stool. Use of a small camera at the end of a tube, to directly examine the colon (sigmoidoscopy or colonoscopy), can detect the earliest forms of colorectal cancer. Talk to your caregiver about this at age 18, when routine screening begins. Direct examination of the colon should be repeated every 5 to 10 years through age 109, unless early forms of pre-cancerous polyps or small growths are found.  Hepatitis C blood testing is recommended for all people born from [birth year] through 1966 and any individual with known risks for hepatitis C.  Practice safe sex. Use condoms and avoid high-risk sexual practices to reduce the spread of sexually transmitted infections (STIs). Sexually active women aged 3 and younger should be checked for Chlamydia, which is a common sexually transmitted infection. Older women with new or multiple partners should also be tested for Chlamydia. Testing for other STIs is recommended if you are sexually active and at increased risk.  Osteoporosis is a disease in which the bones lose minerals and strength with aging. This can result in serious bone fractures. The risk of osteoporosis can be identified using a bone density scan. Women ages 32 and over and women at risk for fractures or osteoporosis should discuss screening with their caregivers. Ask your caregiver whether you should be taking a calcium supplement or vitamin D to reduce the rate of osteoporosis.  Menopause can be associated with physical symptoms and risks. Hormone replacement therapy is available to decrease symptoms and risks. You should talk to your caregiver about whether hormone replacement therapy is right for you.  Use sunscreen. Apply sunscreen liberally and  repeatedly throughout the day. You should seek shade when your shadow is shorter than you. Protect yourself by wearing long sleeves, pants, a wide-brimmed hat, and sunglasses year round, whenever you are outdoors.  Notify your caregiver of new moles or changes in moles, especially if there is a change in shape or color. Also notify your caregiver if a mole is larger than the size of a pencil eraser.  Stay current with your immunizations. Document Released: 09/04/2010 Document Revised: 06/16/2012 Document Reviewed: 01/21/2013 [facility] Patient Information 2015 [place], [place]. This information is not intended to replace advice given to you by your health care provider. Make sure you discuss any questions you have with your health care provider.

## 2013-08-26 ENCOUNTER — Other Ambulatory Visit: Payer: Self-pay | Admitting: Family

## 2013-08-26 LAB — CMP14+EGFR
ALK PHOS: 90 IU/L (ref 39–117)
ALT: 14 IU/L (ref 0–32)
AST: 16 IU/L (ref 0–40)
Albumin/Globulin Ratio: 1.8 (ref 1.1–2.5)
Albumin: 4.6 g/dL (ref 3.5–5.5)
BILIRUBIN TOTAL: 0.4 mg/dL (ref 0.0–1.2)
BUN/Creatinine Ratio: 8 (ref 8–20)
BUN: 7 mg/dL (ref 6–20)
CHLORIDE: 102 mmol/L (ref 97–108)
CO2: 23 mmol/L (ref 18–29)
Calcium: 9.7 mg/dL (ref 8.7–10.2)
Creatinine, Ser: 0.87 mg/dL (ref 0.57–1.00)
GFR calc non Af Amer: 85 mL/min/{1.73_m2} (ref >59)
GFR, EST AFRICAN AMERICAN: 98 mL/min/{1.73_m2} (ref >59)
GLUCOSE: 94 mg/dL (ref 65–99)
Globulin, Total: 2.5 g/dL (ref 1.5–4.5)
POTASSIUM: 4.4 mmol/L (ref 3.5–5.2)
SODIUM: 141 mmol/L (ref 134–144)
TOTAL PROTEIN: 7.1 g/dL (ref 6.0–8.5)

## 2013-08-26 LAB — PAP IG W/ RFLX HPV ASCU: PAP Smear Comment: 0

## 2013-08-26 LAB — LIPID PANEL
CHOLESTEROL TOTAL: 208 mg/dL — AB (ref 100–199)
Chol/HDL Ratio: 3.9 ratio units (ref 0.0–4.4)
HDL: 54 mg/dL (ref >39)
LDL Calculated: 135 mg/dL — ABNORMAL HIGH (ref 0–99)
Triglycerides: 96 mg/dL (ref 0–149)
VLDL CHOLESTEROL CAL: 19 mg/dL (ref 5–40)

## 2013-08-26 LAB — VITAMIN D 25 HYDROXY (VIT D DEFICIENCY, FRACTURES): VIT D 25 HYDROXY: 20.1 ng/mL — AB (ref 30.0–100.0)

## 2013-08-26 MED ORDER — SULFAMETHOXAZOLE-TMP DS 800-160 MG PO TABS: 1.0000 | ORAL_TABLET | Freq: Two times a day (BID) | ORAL | Status: DC

## 2013-08-26 MED ORDER — SIMVASTATIN 20 MG PO TABS: 20.0000 mg | ORAL_TABLET | Freq: Every day | ORAL | Status: DC

## 2013-09-11 ENCOUNTER — Telehealth: Payer: Self-pay | Admitting: Family

## 2013-09-11 NOTE — Telephone Encounter (Signed)
Has to be seen for referral

## 2013-09-11 NOTE — Telephone Encounter (Signed)
Appointment made monday

## 2013-09-14 ENCOUNTER — Ambulatory Visit (INDEPENDENT_AMBULATORY_CARE_PROVIDER_SITE_OTHER): Payer: Medicare Other | Admitting: Family

## 2013-09-14 ENCOUNTER — Encounter: Payer: Self-pay | Admitting: Family

## 2013-09-14 VITALS — BP 110/77 | HR 72 | Temp 98.0°F | Ht 63.0 in | Wt 180.8 lb

## 2013-09-14 DIAGNOSIS — E785 Hyperlipidemia, unspecified: Secondary | ICD-10-CM | POA: Insufficient documentation

## 2013-09-14 DIAGNOSIS — N63 Unspecified lump in unspecified breast: Secondary | ICD-10-CM

## 2013-09-14 DIAGNOSIS — N6310 Unspecified lump in the right breast, unspecified quadrant: Secondary | ICD-10-CM

## 2013-09-14 NOTE — Patient Instructions (Signed)

## 2013-09-14 NOTE — Progress Notes (Signed)
   Subjective:    Patient ID: Stacy Wolf, female    DOB: 1976/01/10, 38 y.o.   MRN: [id number]  HPI Pt presents to the office for a lump in her right breast. Pt states she noticed it last week. States she was giving herself a breast exam and noticed two different nontender lumps in her right breast. Pt states she was on her period. Pt denies any redness, recent infections, or fevers.   Review of Systems  Constitutional: Negative.   HENT: Negative.   Eyes: Negative.   Respiratory: Negative.  Negative for shortness of breath.   Cardiovascular: Negative.  Negative for palpitations.  Gastrointestinal: Negative.   Endocrine: Negative.   Genitourinary: Negative.   Musculoskeletal: Negative.   Neurological: Negative.  Negative for headaches.  Hematological: Negative.   Psychiatric/Behavioral: Negative.   All other systems reviewed and are negative.      Objective:   Physical Exam  Vitals reviewed. Constitutional: She is oriented to person, place, and time. She appears well-developed and well-nourished. No distress.  Eyes: Pupils are equal, round, and reactive to light.  Neck: Normal range of motion. Neck supple. No thyromegaly present.  Cardiovascular: Normal rate, regular rhythm, normal heart sounds and intact distal pulses.   No murmur heard. Pulmonary/Chest: Effort normal and breath sounds normal. No respiratory distress. She has no wheezes. Right breast exhibits tenderness. Right breast exhibits no inverted nipple, no mass, no nipple discharge and no skin change. Left breast exhibits no inverted nipple, no mass, no nipple discharge, no skin change and no tenderness. Breasts are symmetrical.  Abdominal: Soft. Bowel sounds are normal. She exhibits no distension. There is no tenderness.  Musculoskeletal: Normal range of motion. She exhibits no edema and no tenderness.  Neurological: She is alert and oriented to person, place, and time. She has normal reflexes. No cranial nerve  deficit.  Skin: Skin is warm and dry.  Psychiatric: She has a normal mood and affect. Her behavior is normal. Judgment and thought content normal.    BP 110/77  Pulse 72  Temp(Src) 98 F (36.7 C) (Oral)  Ht 5\' 3"  (1.6 m)  Wt 180 lb 12.8 oz (82.01 kg)  BMI 32.04 kg/m2  LMP 09/04/2013       Assessment & Plan:  1. Lump of breast, right -No lumps felt on exam- But refer for mamogram to r/o anything -Monthly Self breast information given - Ambulatory referral to [facility] prn  Evelina Dun, FNP

## 2013-09-15 ENCOUNTER — Other Ambulatory Visit: Payer: Self-pay | Admitting: Family

## 2013-09-15 DIAGNOSIS — N6310 Unspecified lump in the right breast, unspecified quadrant: Secondary | ICD-10-CM

## 2013-09-18 ENCOUNTER — Other Ambulatory Visit: Payer: Self-pay | Admitting: Family

## 2013-09-18 DIAGNOSIS — N6310 Unspecified lump in the right breast, unspecified quadrant: Secondary | ICD-10-CM

## 2013-09-24 ENCOUNTER — Ambulatory Visit
Admission: RE | Admit: 2013-09-24 | Discharge: 2013-09-24 | Disposition: A | Discharge status: Home / Self Care | Payer: Medicare Other | Source: Ambulatory Visit | Attending: Family | Admitting: Family

## 2013-09-24 DIAGNOSIS — N6310 Unspecified lump in the right breast, unspecified quadrant: Secondary | ICD-10-CM

## 2013-11-04 ENCOUNTER — Telehealth: Payer: Self-pay | Admitting: Family Medicine

## 2013-11-04 NOTE — Telephone Encounter (Signed)
appt given for tomorrow with [facility]

## 2013-11-05 ENCOUNTER — Encounter: Payer: Self-pay | Admitting: Nurse Practitioner

## 2013-11-05 ENCOUNTER — Ambulatory Visit (INDEPENDENT_AMBULATORY_CARE_PROVIDER_SITE_OTHER): Payer: Medicare Other | Admitting: Nurse Practitioner

## 2013-11-05 VITALS — BP 113/82 | HR 70 | Temp 97.5°F | Ht 63.0 in | Wt 173.6 lb

## 2013-11-05 DIAGNOSIS — G43001 Migraine without aura, not intractable, with status migrainosus: Secondary | ICD-10-CM

## 2013-11-05 MED ORDER — SUMATRIPTAN SUCCINATE 6 MG/0.5ML ~~LOC~~ SOAJ: 6.0000 mg | SUBCUTANEOUS | Status: DC | PRN

## 2013-11-05 MED ORDER — KETOROLAC TROMETHAMINE 60 MG/2ML IM SOLN
60.0000 mg | Freq: Once | INTRAMUSCULAR | Status: AC
  Administered 2013-11-05: 60 mg via INTRAMUSCULAR

## 2013-11-05 NOTE — Patient Instructions (Signed)

## 2013-11-05 NOTE — Progress Notes (Signed)
   Subjective:    Patient ID: Stacy Wolf, female    DOB: [birth year], 38 y.o.   MRN: [id number]  HPI Has had a migraine for the past 3 days with dizziness.  Denies nausea and vomiting.  Uses Imitrex injections in the past but was out of refills.  Took Motrin for the headache without relief.  Has left leg numbness and weakness at times after sitting and getting in a standing position for the past two months.  Having urinary frequency for the past two weeks.  States she has a migraine every week lasting usually 1 to 3 days. Rates pain 9/10- says one of the worst migrianes she has ever had.    Review of Systems  Constitutional: Negative.   Eyes: Negative for photophobia and visual disturbance.  Respiratory: Negative.   Cardiovascular: Negative.   Skin: Negative.   Neurological: Positive for numbness and headaches.       Headache on left frontal and temporal  Numbness in left leg from hip to foot  Psychiatric/Behavioral: Negative.        Objective:   Physical Exam  Constitutional: She is oriented to person, place, and time. She appears well-developed and well-nourished.  Eyes: Pupils are equal, round, and reactive to light.  Neck: Normal range of motion.  Cardiovascular: Normal rate, regular rhythm and normal heart sounds.   Pulmonary/Chest: Effort normal and breath sounds normal.  Abdominal: Soft. Bowel sounds are normal.  Musculoskeletal:       Left hip: She exhibits normal range of motion, normal strength and no tenderness.       Lumbar back: She exhibits tenderness. She exhibits normal range of motion.  With palpation in upper lumbar region  Neurological: She is alert and oriented to person, place, and time. She has normal reflexes.  Skin: Skin is warm and dry.  Psychiatric: She has a normal mood and affect. Her behavior is normal. Judgment and thought content normal.   BP 113/82  Pulse 70  Temp(Src) 97.5 F (36.4 C) (Oral)  Ht 5\' 3"  (1.6 m)  Wt 173 lb 9.6 oz (78.744 kg)   BMI 30.76 kg/m2  LMP 10/27/2013        Assessment & Plan:   1. Migraine without aura and with status migrainosus, not intractable    Meds ordered this encounter  Medications  . benztropine (COGENTIN) 0.5 MG tablet    Sig: Take 2 tablets by mouth at bedtime.  Marland Kitchen ketorolac (TORADOL) injection 60 mg    Sig:   . SUMAtriptan 6 MG/0.5ML SOAJ    Sig: Inject 6 mg into the skin as needed.    Dispense:  2 Syringe    Refill:  11    Order Specific Question:  Supervising Provider    Answer:  Chipper Herb [[id number]]   Force fluids Rest Avoid all caffeine RTOprn  Mary-Margaret Hassell Done, FNP

## 2013-11-10 ENCOUNTER — Ambulatory Visit (HOSPITAL_COMMUNITY)
Admission: RE | Admit: 2013-11-10 | Discharge: 2013-11-10 | Disposition: A | Discharge status: Home / Self Care | Payer: Medicare Other | Source: Ambulatory Visit | Attending: Nurse Practitioner | Admitting: Nurse Practitioner

## 2013-11-10 DIAGNOSIS — G43001 Migraine without aura, not intractable, with status migrainosus: Secondary | ICD-10-CM

## 2013-11-10 DIAGNOSIS — G43909 Migraine, unspecified, not intractable, without status migrainosus: Secondary | ICD-10-CM | POA: Diagnosis not present

## 2014-02-01 ENCOUNTER — Encounter (HOSPITAL_COMMUNITY): Payer: Self-pay | Admitting: *Deleted

## 2014-02-01 ENCOUNTER — Emergency Department (HOSPITAL_COMMUNITY)
Admission: EM | Admit: 2014-02-01 | Discharge: 2014-02-01 | Disposition: A | Discharge status: Home / Self Care | Payer: Medicare Other | Attending: Emergency Medicine | Admitting: Emergency Medicine

## 2014-02-01 ENCOUNTER — Emergency Department (HOSPITAL_COMMUNITY): Payer: Medicare Other

## 2014-02-01 DIAGNOSIS — G43909 Migraine, unspecified, not intractable, without status migrainosus: Secondary | ICD-10-CM | POA: Diagnosis not present

## 2014-02-01 DIAGNOSIS — Z720 Tobacco use: Secondary | ICD-10-CM | POA: Insufficient documentation

## 2014-02-01 DIAGNOSIS — L02411 Cutaneous abscess of right axilla: Secondary | ICD-10-CM | POA: Insufficient documentation

## 2014-02-01 DIAGNOSIS — Z79899 Other long term (current) drug therapy: Secondary | ICD-10-CM | POA: Insufficient documentation

## 2014-02-01 DIAGNOSIS — Z8659 Personal history of other mental and behavioral disorders: Secondary | ICD-10-CM | POA: Diagnosis not present

## 2014-02-01 DIAGNOSIS — R05 Cough: Secondary | ICD-10-CM

## 2014-02-01 DIAGNOSIS — Z792 Long term (current) use of antibiotics: Secondary | ICD-10-CM | POA: Diagnosis not present

## 2014-02-01 DIAGNOSIS — R059 Cough, unspecified: Secondary | ICD-10-CM

## 2014-02-01 MED ORDER — ALBUTEROL SULFATE HFA 108 (90 BASE) MCG/ACT IN AERS
2.0000 | INHALATION_SPRAY | Freq: Once | RESPIRATORY_TRACT | Status: AC
  Administered 2014-02-01: 2 via RESPIRATORY_TRACT
  Filled 2014-02-01: qty 6.7

## 2014-02-01 MED ORDER — GUAIFENESIN-CODEINE 100-10 MG/5ML PO SOLN
10.0000 mL | Freq: Once | ORAL | Status: AC
Start: 2014-02-01 — End: 2014-02-01
  Administered 2014-02-01: 10 mL via ORAL
  Filled 2014-02-01: qty 10

## 2014-02-01 MED ORDER — GUAIFENESIN-CODEINE 100-10 MG/5ML PO SYRP: 10.0000 mL | ORAL_SOLUTION | Freq: Three times a day (TID) | ORAL | Status: DC | PRN

## 2014-02-01 MED ORDER — GUAIFENESIN-CODEINE 100-10 MG/5ML PO SOLN
ORAL | Status: AC
  Filled 2014-02-01: qty 5

## 2014-02-01 MED ORDER — SULFAMETHOXAZOLE-TRIMETHOPRIM 800-160 MG PO TABS: 1.0000 | ORAL_TABLET | Freq: Two times a day (BID) | ORAL | Status: DC

## 2014-02-01 NOTE — ED Notes (Signed)
Cough /cold sx for  1 week. Green sputum.  Has a "boil " rt axilla, says she has been putting fat back on it for 3 days

## 2014-02-01 NOTE — ED Notes (Signed)
Pt states cough symptoms x 1 week, productive and green in color. States "bump, people been telling me it's a boil" under right arm x 1 week. NAD

## 2014-02-01 NOTE — Discharge Instructions (Signed)
Abscess An abscess (boil or furuncle) is an infected area on or under the skin. This area is filled with yellowish-white fluid (pus) and other material (debris). HOME CARE   Only take medicines as told by your doctor.  If you were given antibiotic medicine, take it as directed. Finish the medicine even if you start to feel better.  If gauze is used, follow your doctor's directions for changing the gauze.  To avoid spreading the infection:  Keep your abscess covered with a bandage.  Wash your hands well.  Do not share personal care items, towels, or whirlpools with others.  Avoid skin contact with others.  Keep your skin and clothes clean around the abscess.  Keep all doctor visits as told. GET HELP RIGHT AWAY IF:   You have more pain, puffiness (swelling), or redness in the wound site.  You have more fluid or blood coming from the wound site.  You have muscle aches, chills, or you feel sick.  You have a fever. MAKE SURE YOU:   Understand these instructions.  Will watch your condition.  Will get help right away if you are not doing well or get worse. Document Released: 08/08/2007 Document Revised: 08/21/2011 Document Reviewed: 05/04/2011 [facility] Patient Information 2015 [place], [place]. This information is not intended to replace advice given to you by your health care provider. Make sure you discuss any questions you have with your health care provider.  Cough, Adult  A cough is a reflex. It helps you clear your throat and airways. A cough can help heal your body. A cough can last 2 or 3 weeks (acute) or may last more than 8 weeks (chronic). Some common causes of a cough can include an infection, allergy, or a cold. HOME CARE  Only take medicine as told by your doctor.  If given, take your medicines (antibiotics) as told. Finish them even if you start to feel better.  Use a cold steam vaporizer or humidifier in your home. This can help loosen thick spit  (secretions).  Sleep so you are almost sitting up (semi-upright). Use pillows to do this. This helps reduce coughing.  Rest as needed.  Stop smoking if you smoke. GET HELP RIGHT AWAY IF:  You have yellowish-white fluid (pus) in your thick spit.  Your cough gets worse.  Your medicine does not reduce coughing, and you are losing sleep.  You cough up blood.  You have trouble breathing.  Your pain gets worse and medicine does not help.  You have a fever. MAKE SURE YOU:   Understand these instructions.  Will watch your condition.  Will get help right away if you are not doing well or get worse. Document Released: 11/02/2010 Document Revised: 07/06/2013 Document Reviewed: 11/02/2010 [facility] Patient Information 2015 [place], [place]. This information is not intended to replace advice given to you by your health care provider. Make sure you discuss any questions you have with your health care provider.

## 2014-02-03 NOTE — ED Provider Notes (Signed)
CSN: [id number]     Arrival date & time 02/01/14  1844 History   First MD Initiated Contact with Patient 02/01/14 2004     Chief Complaint  Patient presents with  . Cough     (Consider location/radiation/quality/duration/timing/severity/associated sxs/prior Treatment) HPI  Stacy Wolf is a 38 y.o. female who presents to the Emergency Department complaining of cough and cold symptoms for 1 week.  She reports a productive cough of green sputum with nasal congestion and runny nose.  She has been taking OTC cough and cold medication without relief.  She also reports having a "boil" to her right armpit for one week.  She reports pain to the area with arm movement and palpation.  She has been applying "fatback" to the area without relief.  She denies fever, shortness of breath, chest pain, redness of the axilla, or drainage.     Past Medical History  Diagnosis Date  . Migraines   . Bipolar 1 disorder   . Anxiety   . Schizophrenia    Past Surgical History  Procedure Laterality Date  . Arm surgery    . Tubal ligation     Family History  Problem Relation Age of Onset  . Cancer Mother 29    breast CA at 54 and uterine CA at 28  . Cancer Sister 62    uterine  . Cancer Sister 42    uterine   History  Substance Use Topics  . Smoking status: Current Every Day Smoker -- 0.75 packs/day  . Smokeless tobacco: Never Used  . Alcohol Use: No   OB History    No data available     Review of Systems  Constitutional: Negative for fever, chills and appetite change.  HENT: Positive for congestion and rhinorrhea. Negative for sore throat and trouble swallowing.   Respiratory: Positive for cough. Negative for chest tightness, shortness of breath and wheezing.   Cardiovascular: Negative for chest pain.  Gastrointestinal: Negative for nausea, vomiting and abdominal pain.  Genitourinary: Negative for dysuria.  Musculoskeletal: Negative for joint swelling and arthralgias.  Skin: Positive for  color change. Negative for rash.       Abscess   Neurological: Negative for dizziness, weakness and numbness.  Hematological: Negative for adenopathy.  All other systems reviewed and are negative.     Allergies  Haloperidol and related; Imitrex; Latuda; Wellbutrin; and Zoloft  Home Medications   Prior to Admission medications   Medication Sig Start Date End Date Taking? Authorizing Provider  benztropine (COGENTIN) 0.5 MG tablet Take 2 tablets by mouth at bedtime. 10/08/13   Historical Provider, MD  guaiFENesin-codeine (ROBITUSSIN AC) 100-10 MG/5ML syrup Take 10 mLs by mouth 3 (three) times daily as needed. 02/01/14   Madelyn Tlatelpa L. Kennley Schwandt, PA-C  haloperidol (HALDOL) 0.5 MG tablet Take 0.5 mg by mouth at bedtime as needed for agitation.    Historical Provider, MD  simvastatin (ZOCOR) 20 MG tablet Take 1 tablet (20 mg total) by mouth at bedtime. 08/26/13   Sharion Balloon, FNP  sulfamethoxazole-trimethoprim (SEPTRA DS) 800-160 MG per tablet Take 1 tablet by mouth 2 (two) times daily. 02/01/14   Alysabeth Scalia L. Willie Loy, PA-C  SUMAtriptan 6 MG/0.5ML SOAJ Inject 6 mg into the skin as needed. 11/05/13   Mary-Margaret Hassell Done, FNP   BP 142/98 mmHg  Pulse 61  Temp(Src) 98.5 F (36.9 C) (Oral)  Resp 16  Ht 5\' 3"  (1.6 m)  Wt 171 lb (77.565 kg)  BMI 30.30 kg/m2  SpO2 99%  LMP 01/07/2014 Physical Exam  Constitutional: She is oriented to person, place, and time. She appears well-developed and well-nourished. No distress.  HENT:  Head: Normocephalic and atraumatic.  Neck: Normal range of motion. Neck supple.  Cardiovascular: Normal rate, regular rhythm and normal heart sounds.   No murmur heard. Pulmonary/Chest: Effort normal and breath sounds normal. No respiratory distress. She has no wheezes. She has no rales. She exhibits no tenderness.  Musculoskeletal: Normal range of motion.  Lymphadenopathy:    She has no cervical adenopathy.  Neurological: She is alert and oriented to person, place, and time.  She exhibits normal muscle tone. Coordination normal.  Skin: Skin is warm and dry. There is erythema.  Localized induration of the right axilla.  No erythema or drainage noted.    Nursing note and vitals reviewed.   ED Course  Procedures (including critical care time) Labs Review Labs Reviewed - No data to display  Imaging Review Dg Chest 2 View  02/01/2014   CLINICAL DATA:  Productive cough for 1 week.  EXAM: CHEST  2 VIEW  COMPARISON:  None.  FINDINGS: Normal cardiac silhouette and mediastinal contours. No focal parenchymal opacities. No pleural effusion or pneumothorax. No evidence of edema. No acute osseus abnormalities.  IMPRESSION: No acute cardiopulmonary disease. Specifically, no evidence of pneumonia.   Electronically Signed   By: Sandi Mariscal M.D.   On: 02/01/2014 19:32     EKG Interpretation None      MDM   Final diagnoses:  Abscess of axilla, right    Pt is well appearing.  VSS.  Lung sounds are CTA.  Cough/URI sx's likely viral.  Probable early developing abscess of the right axilla.  I&D not indicated at present.  Pt agrees to warm compresses , rx for robitussin AC, and septra     Eberardo Demello L. Vanessa [place], PA-C 02/03/14 [place], MD 02/03/14 1525

## 2014-07-01 ENCOUNTER — Ambulatory Visit (INDEPENDENT_AMBULATORY_CARE_PROVIDER_SITE_OTHER): Payer: Medicare Other | Admitting: Nurse Practitioner

## 2014-07-01 ENCOUNTER — Encounter: Payer: Self-pay | Admitting: Nurse Practitioner

## 2014-07-01 VITALS — BP 110/77 | HR 74 | Temp 97.8°F | Ht 63.0 in | Wt 167.2 lb

## 2014-07-01 DIAGNOSIS — K254 Chronic or unspecified gastric ulcer with hemorrhage: Secondary | ICD-10-CM

## 2014-07-01 LAB — POCT HEMOGLOBIN: HEMOGLOBIN: 13.3 g/dL (ref 12.2–16.2)

## 2014-07-01 MED ORDER — LANSOPRAZOLE 30 MG PO CPDR: 30.0000 mg | DELAYED_RELEASE_CAPSULE | Freq: Two times a day (BID) | ORAL | Status: DC

## 2014-07-01 NOTE — Progress Notes (Signed)
   Subjective:    Patient ID: Stacy Wolf, female    DOB: 1975/08/07, 39 y.o.   MRN: [id number]  HPI Patient said she ate some pizza Monday night and when she went o bed she started getting nauseated and felt like she had to go to restroom. Went on for the last 3 days. SHe said last she started vomiting and it was black with abdominal pain. When she eats it feels like food is not digesting.    Review of Systems  Constitutional: Negative.   HENT: Negative.   Respiratory: Negative.   Cardiovascular: Negative.   Genitourinary: Negative.   Neurological: Negative.   Psychiatric/Behavioral: Negative.   All other systems reviewed and are negative.      Objective:   Physical Exam  Constitutional: She is oriented to person, place, and time. She appears well-developed and well-nourished.  Cardiovascular: Normal rate, regular rhythm and normal heart sounds.   Pulmonary/Chest: Effort normal and breath sounds normal.  Neurological: She is alert and oriented to person, place, and time.  Skin: Skin is warm.  Psychiatric: She has a normal mood and affect. Her behavior is normal. Judgment and thought content normal.   BP 110/77 mmHg  Pulse 74  Temp(Src) 97.8 F (36.6 C) (Oral)  Ht 5\' 3"  (1.6 m)  Wt 167 lb 3.2 oz (75.841 kg)  BMI 29.63 kg/m2   Results for orders placed or performed in visit on 07/01/14  POCT hemoglobin  Result Value Ref Range   Hemoglobin 13.3 12.2 - 16.2 g/dL        Assessment & Plan:   1. Gastrointestinal hemorrhage associated with gastric ulcer    Meds ordered this encounter  Medications  . lansoprazole (PREVACID) 30 MG capsule    Sig: Take 1 capsule (30 mg total) by mouth 2 (two) times daily before a meal.    Dispense:  60 capsule    Refill:  1    Order Specific Question:  Supervising Provider    Answer:  Joycelyn Man   Avoid spicy and fatty foods If vomiting continues needs to go to [place], FNP

## 2014-07-01 NOTE — Patient Instructions (Signed)
Gastrointestinal Bleeding °Gastrointestinal bleeding is bleeding somewhere along the path that food travels through the body (digestive tract). This path is anywhere between the mouth and the opening of the butt (anus). You may have blood in your throw up (vomit) or in your poop (stools). If there is a lot of bleeding, you may need to stay in the hospital. °HOME CARE °· Only take medicine as told by your doctor. °· Eat foods with fiber such as whole grains, fruits, and vegetables. You can also try eating 1 to 3 prunes a day. °· Drink enough fluids to keep your pee (urine) clear or pale yellow. °GET HELP RIGHT AWAY IF:  °· Your bleeding gets worse. °· You feel dizzy, weak, or you pass out (faint). °· You have bad cramps in your back or belly (abdomen). °· You have large blood clumps (clots) in your poop. °· Your problems are getting worse. °MAKE SURE YOU:  °· Understand these instructions. °· Will watch your condition. °· Will get help right away if you are not doing well or get worse. °Document Released: 11/29/2007 Document Revised: 02/06/2012 Document Reviewed: 01/29/2011 °[employer]® Patient Information ©2015 [employer]. This information is not intended to replace advice given to you by your health care provider. Make sure you discuss any questions you have with your health care provider. ° ° °

## 2014-08-24 ENCOUNTER — Ambulatory Visit (INDEPENDENT_AMBULATORY_CARE_PROVIDER_SITE_OTHER): Payer: Medicare Other | Admitting: Nurse Practitioner

## 2014-08-24 ENCOUNTER — Encounter: Payer: Self-pay | Admitting: Nurse Practitioner

## 2014-08-24 VITALS — BP 121/81 | HR 72 | Temp 98.6°F | Ht 63.0 in | Wt 166.4 lb

## 2014-08-24 DIAGNOSIS — Z01419 Encounter for gynecological examination (general) (routine) without abnormal findings: Secondary | ICD-10-CM

## 2014-08-24 DIAGNOSIS — K219 Gastro-esophageal reflux disease without esophagitis: Secondary | ICD-10-CM | POA: Diagnosis not present

## 2014-08-24 DIAGNOSIS — K254 Chronic or unspecified gastric ulcer with hemorrhage: Secondary | ICD-10-CM

## 2014-08-24 DIAGNOSIS — Z0000 Encounter for general adult medical examination without abnormal findings: Secondary | ICD-10-CM | POA: Diagnosis not present

## 2014-08-24 DIAGNOSIS — F3170 Bipolar disorder, currently in remission, most recent episode unspecified: Secondary | ICD-10-CM

## 2014-08-24 DIAGNOSIS — E785 Hyperlipidemia, unspecified: Secondary | ICD-10-CM | POA: Diagnosis not present

## 2014-08-24 LAB — POCT URINALYSIS DIPSTICK
Bilirubin, UA: NEGATIVE
Glucose, UA: NEGATIVE
Ketones, UA: NEGATIVE
Leukocytes, UA: NEGATIVE
Nitrite, UA: NEGATIVE
PH UA: 6
Protein, UA: NEGATIVE
Spec Grav, UA: 1.015
Urobilinogen, UA: NEGATIVE

## 2014-08-24 LAB — POCT UA - MICROSCOPIC ONLY
CASTS, UR, LPF, POC: NEGATIVE
CRYSTALS, UR, HPF, POC: NEGATIVE
MUCUS UA: NEGATIVE
YEAST UA: NEGATIVE

## 2014-08-24 MED ORDER — LANSOPRAZOLE 30 MG PO CPDR: 30.0000 mg | DELAYED_RELEASE_CAPSULE | Freq: Two times a day (BID) | ORAL | Status: DC

## 2014-08-24 NOTE — Patient Instructions (Signed)
Fat and Cholesterol Control Diet Fat and cholesterol levels in your blood and organs are influenced by your diet. High levels of fat and cholesterol may lead to diseases of the heart, small and large blood vessels, gallbladder, liver, and pancreas. CONTROLLING FAT AND CHOLESTEROL WITH DIET Although exercise and lifestyle factors are important, your diet is key. That is because certain foods are known to raise cholesterol and others to lower it. The goal is to balance foods for their effect on cholesterol and more importantly, to replace saturated and trans fat with other types of fat, such as monounsaturated fat, polyunsaturated fat, and omega-3 fatty acids. On average, a person should consume no more than 15 to 17 g of saturated fat daily. Saturated and trans fats are considered "bad" fats, and they will raise LDL cholesterol. Saturated fats are primarily found in animal products such as meats, butter, and cream. However, that does not mean you need to give up all your favorite foods. Today, there are good tasting, low-fat, low-cholesterol substitutes for most of the things you like to eat. Choose low-fat or nonfat alternatives. Choose round or loin cuts of red meat. These types of cuts are lowest in fat and cholesterol. Chicken (without the skin), fish, veal, and ground turkey breast are great choices. Eliminate fatty meats, such as hot dogs and salami. Even shellfish have little or no saturated fat. Have a 3 oz (85 g) portion when you eat lean meat, poultry, or fish. Trans fats are also called "partially hydrogenated oils." They are oils that have been scientifically manipulated so that they are solid at room temperature resulting in a longer shelf life and improved taste and texture of foods in which they are added. Trans fats are found in stick margarine, some tub margarines, cookies, crackers, and baked goods.  When baking and cooking, oils are a great substitute for butter. The monounsaturated oils are  especially beneficial since it is believed they lower LDL and raise HDL. The oils you should avoid entirely are saturated tropical oils, such as coconut and palm.  Remember to eat a lot from food groups that are naturally free of saturated and trans fat, including fish, fruit, vegetables, beans, grains (barley, rice, couscous, bulgur wheat), and pasta (without cream sauces).  IDENTIFYING FOODS THAT LOWER FAT AND CHOLESTEROL  Soluble fiber may lower your cholesterol. This type of fiber is found in fruits such as apples, vegetables such as broccoli, potatoes, and carrots, legumes such as beans, peas, and lentils, and grains such as barley. Foods fortified with plant sterols (phytosterol) may also lower cholesterol. You should eat at least 2 g per day of these foods for a cholesterol lowering effect.  Read package labels to identify low-saturated fats, trans fat free, and low-fat foods at the supermarket. Select cheeses that have only 2 to 3 g saturated fat per ounce. Use a heart-healthy tub margarine that is free of trans fats or partially hydrogenated oil. When buying baked goods (cookies, crackers), avoid partially hydrogenated oils. Breads and muffins should be made from whole grains (whole-wheat or whole oat flour, instead of "flour" or "enriched flour"). Buy non-creamy canned soups with reduced salt and no added fats.  FOOD PREPARATION TECHNIQUES  Never deep-fry. If you must fry, either stir-fry, which uses very little fat, or use non-stick cooking sprays. When possible, broil, bake, or roast meats, and steam vegetables. Instead of putting butter or margarine on vegetables, use lemon and herbs, applesauce, and cinnamon (for squash and sweet potatoes). Use nonfat   yogurt, salsa, and low-fat dressings for salads.  LOW-SATURATED FAT / LOW-FAT FOOD SUBSTITUTES Meats / Saturated Fat (g)  Avoid: Steak, marbled (3 oz/85 g) / 11 g  Choose: Steak, lean (3 oz/85 g) / 4 g  Avoid: Hamburger (3 oz/85 g) / 7  g  Choose: Hamburger, lean (3 oz/85 g) / 5 g  Avoid: Ham (3 oz/85 g) / 6 g  Choose: Ham, lean cut (3 oz/85 g) / 2.4 g  Avoid: Chicken, with skin, dark meat (3 oz/85 g) / 4 g  Choose: Chicken, skin removed, dark meat (3 oz/85 g) / 2 g  Avoid: Chicken, with skin, light meat (3 oz/85 g) / 2.5 g  Choose: Chicken, skin removed, light meat (3 oz/85 g) / 1 g Dairy / Saturated Fat (g)  Avoid: Whole milk (1 cup) / 5 g  Choose: Low-fat milk, 2% (1 cup) / 3 g  Choose: Low-fat milk, 1% (1 cup) / 1.5 g  Choose: Skim milk (1 cup) / 0.3 g  Avoid: Hard cheese (1 oz/28 g) / 6 g  Choose: Skim milk cheese (1 oz/28 g) / 2 to 3 g  Avoid: Cottage cheese, 4% fat (1 cup) / 6.5 g  Choose: Low-fat cottage cheese, 1% fat (1 cup) / 1.5 g  Avoid: Ice cream (1 cup) / 9 g  Choose: Sherbet (1 cup) / 2.5 g  Choose: Nonfat frozen yogurt (1 cup) / 0.3 g  Choose: Frozen fruit bar / trace  Avoid: Whipped cream (1 tbs) / 3.5 g  Choose: Nondairy whipped topping (1 tbs) / 1 g Condiments / Saturated Fat (g)  Avoid: Mayonnaise (1 tbs) / 2 g  Choose: Low-fat mayonnaise (1 tbs) / 1 g  Avoid: Butter (1 tbs) / 7 g  Choose: Extra light margarine (1 tbs) / 1 g  Avoid: Coconut oil (1 tbs) / 11.8 g  Choose: Olive oil (1 tbs) / 1.8 g  Choose: Corn oil (1 tbs) / 1.7 g  Choose: Safflower oil (1 tbs) / 1.2 g  Choose: Sunflower oil (1 tbs) / 1.4 g  Choose: Soybean oil (1 tbs) / 2.4 g  Choose: Canola oil (1 tbs) / 1 g Document Released: 02/19/2005 Document Revised: 06/16/2012 Document Reviewed: 05/20/2013 [employer] Patient Information 2015 [employer]. This information is not intended to replace advice given to you by your health care provider. Make sure you discuss any questions you have with your health care provider.  

## 2014-08-24 NOTE — Addendum Note (Signed)
Addended by: Earlene Plater on: 08/24/2014 03:32 PM   Modules accepted: Miquel Dunn

## 2014-08-24 NOTE — Progress Notes (Signed)
Subjective:    Patient ID: Stacy Wolf, female    DOB: 1976/01/23, 39 y.o.   MRN: [id number]  HPI Patient here today for annual physical exam and PAP. DOing welll today without complaints. Current medical problems include: -hyperlipidemia - she is currently not taking anything for cholesterol- Was on simvastatin which she said made her feel lazy- she stopped taking and has been trying to watch diet and exercise. - GERD- prevacid daily- keeps symptoms under control. -Bipolar- cogentin and haldol - combination working well- is currently under control- sees [facility] for counseling and medication.  Review of Systems  Constitutional: Negative.   HENT: Negative.   Respiratory: Negative.   Cardiovascular: Negative.   Gastrointestinal: Negative.   Genitourinary: Negative.   Neurological: Negative.   Psychiatric/Behavioral: Negative.   All other systems reviewed and are negative.      Objective:   Physical Exam  Constitutional: She is oriented to person, place, and time. She appears well-developed and well-nourished.  HENT:  Head: Normocephalic.  Right Ear: Hearing, tympanic membrane, external ear and ear canal normal.  Left Ear: Hearing, tympanic membrane, external ear and ear canal normal.  Nose: Nose normal.  Mouth/Throat: Uvula is midline and oropharynx is clear and moist.  Eyes: Conjunctivae and EOM are normal. Pupils are equal, round, and reactive to light.  Neck: Normal range of motion and full passive range of motion without pain. Neck supple. No JVD present. Carotid bruit is not present. No thyroid mass and no thyromegaly present.  Cardiovascular: Normal rate, normal heart sounds and intact distal pulses.   No murmur heard. Pulmonary/Chest: Effort normal and breath sounds normal. Right breast exhibits no inverted nipple, no mass, no nipple discharge, no skin change and no tenderness. Left breast exhibits no inverted nipple, no mass, no nipple discharge, no skin change and no  tenderness.  Abdominal: Soft. Bowel sounds are normal. She exhibits no mass. There is no tenderness.  Genitourinary: Vagina normal and uterus normal. No breast swelling, tenderness, discharge or bleeding.  bimanual exam-No adnexal masses or tenderness. Cervix parous and pink No vaginal discharge  Musculoskeletal: Normal range of motion.  Lymphadenopathy:    She has no cervical adenopathy.  Neurological: She is alert and oriented to person, place, and time.  Skin: Skin is warm and dry.  Psychiatric: She has a normal mood and affect. Her behavior is normal. Judgment and thought content normal.   BP 121/81 mmHg  Pulse 72  Temp(Src) 98.6 F (37 C) (Oral)  Ht '5\' 3"'  (1.6 m)  Wt 166 lb 6.4 oz (75.479 kg)  BMI 29.48 kg/m2  LMP 08/04/2014  Results for orders placed or performed in visit on 08/24/14  POCT UA - Microscopic Only  Result Value Ref Range   WBC, Ur, HPF, POC rare    RBC, urine, microscopic 1-5    Bacteria, U Microscopic rare    Mucus, UA neg    Epithelial cells, urine per micros occ    Crystals, Ur, HPF, POC neg    Casts, Ur, LPF, POC neg    Yeast, UA neg   POCT urinalysis dipstick  Result Value Ref Range   Color, UA yellow    Clarity, UA clear    Glucose, UA neg    Bilirubin, UA neg    Ketones, UA neg    Spec Grav, UA 1.015    Blood, UA trace    pH, UA 6.0    Protein, UA neg    Urobilinogen, UA negative  Nitrite, UA neg    Leukocytes, UA Negative Negative          Assessment & Plan:  1. Visit for gynecologic examination - POCT UA - Microscopic Only - POCT urinalysis dipstick - Pap IG w/ reflex to HPV when ASC-U  2. Annual physical exam - Thyroid Panel With TSH  3. Hyperlipidemia LDL goal <100 Low fat diet  Will decide on cholesterol meds once labs are  back - CMP14+EGFR - Lipid panel  4. Bipolar affective disorder in remission Keep appointments at [facility] for follow up  5. Gastroesophageal reflux disease without esophagitis Avoid spicy  foods Do not eat 2 hours prior to bedtime   6. Gastrointestinal hemorrhage associated with gastric ulcer Avoid spicy foods and NSAIDS - lansoprazole (PREVACID) 30 MG capsule; Take 1 capsule (30 mg total) by mouth 2 (two) times daily before a meal.  Dispense: 60 capsule; Refill: 5    Labs pending Health maintenance reviewed Diet and exercise encouraged Continue all meds Follow up  In 6 months   [place], FNP

## 2014-08-25 LAB — CMP14+EGFR
ALBUMIN: 4.4 g/dL (ref 3.5–5.5)
ALT: 9 IU/L (ref 0–32)
AST: 15 IU/L (ref 0–40)
Albumin/Globulin Ratio: 1.8 (ref 1.1–2.5)
Alkaline Phosphatase: 80 IU/L (ref 39–117)
BUN/Creatinine Ratio: 12 (ref 8–20)
BUN: 9 mg/dL (ref 6–20)
Bilirubin Total: 0.4 mg/dL (ref 0.0–1.2)
CALCIUM: 9.2 mg/dL (ref 8.7–10.2)
CO2: 25 mmol/L (ref 18–29)
Chloride: 105 mmol/L (ref 97–108)
Creatinine, Ser: 0.74 mg/dL (ref 0.57–1.00)
GFR calc Af Amer: 118 mL/min/{1.73_m2} (ref >59)
GFR calc non Af Amer: 102 mL/min/{1.73_m2} (ref >59)
GLUCOSE: 93 mg/dL (ref 65–99)
Globulin, Total: 2.5 g/dL (ref 1.5–4.5)
POTASSIUM: 4 mmol/L (ref 3.5–5.2)
Sodium: 142 mmol/L (ref 134–144)
TOTAL PROTEIN: 6.9 g/dL (ref 6.0–8.5)

## 2014-08-25 LAB — LIPID PANEL
Chol/HDL Ratio: 4.1 ratio units (ref 0.0–4.4)
Cholesterol, Total: 188 mg/dL (ref 100–199)
HDL: 46 mg/dL (ref >39)
LDL CALC: 121 mg/dL — AB (ref 0–99)
TRIGLYCERIDES: 105 mg/dL (ref 0–149)
VLDL CHOLESTEROL CAL: 21 mg/dL (ref 5–40)

## 2014-08-25 LAB — THYROID PANEL WITH TSH
Free Thyroxine Index: 1.4 (ref 1.2–4.9)
T3 Uptake Ratio: 26 % (ref 24–39)
T4, Total: 5.3 ug/dL (ref 4.5–12.0)
TSH: 0.626 u[IU]/mL (ref 0.450–4.500)

## 2014-08-25 LAB — PAP IG W/ RFLX HPV ASCU: PAP SMEAR COMMENT: 0

## 2014-09-30 DIAGNOSIS — F609 Personality disorder, unspecified: Secondary | ICD-10-CM | POA: Diagnosis not present

## 2014-11-26 ENCOUNTER — Encounter: Payer: Self-pay | Admitting: Pediatrics

## 2014-11-26 ENCOUNTER — Ambulatory Visit (INDEPENDENT_AMBULATORY_CARE_PROVIDER_SITE_OTHER): Payer: Medicare Other | Admitting: Pediatrics

## 2014-11-26 VITALS — BP 129/87 | HR 67 | Temp 97.7°F | Ht 63.0 in | Wt 170.0 lb

## 2014-11-26 DIAGNOSIS — G43109 Migraine with aura, not intractable, without status migrainosus: Secondary | ICD-10-CM

## 2014-11-26 DIAGNOSIS — Z113 Encounter for screening for infections with a predominantly sexual mode of transmission: Secondary | ICD-10-CM | POA: Diagnosis not present

## 2014-11-26 DIAGNOSIS — R3915 Urgency of urination: Secondary | ICD-10-CM

## 2014-11-26 DIAGNOSIS — Z0000 Encounter for general adult medical examination without abnormal findings: Secondary | ICD-10-CM | POA: Diagnosis not present

## 2014-11-26 DIAGNOSIS — H539 Unspecified visual disturbance: Secondary | ICD-10-CM | POA: Diagnosis not present

## 2014-11-26 LAB — POCT URINALYSIS DIPSTICK
Bilirubin, UA: NEGATIVE
GLUCOSE UA: NEGATIVE
Ketones, UA: NEGATIVE
Leukocytes, UA: NEGATIVE
NITRITE UA: NEGATIVE
PROTEIN UA: NEGATIVE
RBC UA: NEGATIVE
Spec Grav, UA: 1.02
Urobilinogen, UA: NEGATIVE
pH, UA: 6

## 2014-11-26 MED ORDER — RIZATRIPTAN BENZOATE 10 MG PO TABS: 10.0000 mg | ORAL_TABLET | ORAL | Status: DC | PRN

## 2014-11-26 NOTE — Progress Notes (Addendum)
Subjective:   Stacy Wolf is a 39 y.o. female who presents for a Medicare Annual Wellness Visit.  Pain in lower back, no dysuria, urinary frequency and urgency, some small amount of incontinence with coughing or laughing  No fevers, normal appetite. Eats once a day. Eats rice and chicken, some vegetables and fruits.  Started smoking at age 42yo, now smoking almost a ppd.  Migraines twice a week, taking excedrin.  Sees a therapist for bipolar disorder, feels like mood has been stable.   Mom had history of breast cancer in her 47s. Pt has phone number for mammograms  Review of Systems  All systems negative other than what is in HPI.  Current Medications (verified) Outpatient Encounter Prescriptions as of 11/26/2014  Medication Sig  . haloperidol (HALDOL) 0.5 MG tablet Take 0.5 mg by mouth at bedtime as needed for agitation.  . lansoprazole (PREVACID) 30 MG capsule Take 1 capsule (30 mg total) by mouth 2 (two) times daily before a meal.  . rizatriptan (MAXALT) 10 MG tablet Take 1 tablet (10 mg total) by mouth as needed for migraine. May repeat in 2 hours if needed  . [DISCONTINUED] benztropine (COGENTIN) 0.5 MG tablet Take 2 tablets by mouth at bedtime.  . [DISCONTINUED] SUMAtriptan 6 MG/0.5ML SOAJ Inject 6 mg into the skin as needed. (Patient not taking: Reported on 11/26/2014)   No facility-administered encounter medications on file as of 11/26/2014.    Allergies (verified) Haloperidol and related; Imitrex; Latuda; Wellbutrin; and Zoloft   History: Past Medical History  Diagnosis Date  . Migraines   . Bipolar 1 disorder   . Anxiety   . Schizophrenia    Past Surgical History  Procedure Laterality Date  . Arm surgery    . Tubal ligation     Family History  Problem Relation Age of Onset  . Cancer Mother 66    breast CA at 47 and uterine CA at 72  . Cancer Sister 58    uterine  . Cancer Sister 56    uterine   Social History   Occupational History  . Not on  file.   Social History Main Topics  . Smoking status: Current Every Day Smoker -- 1.00 packs/day    Types: Cigarettes  . Smokeless tobacco: Never Used  . Alcohol Use: No  . Drug Use: No  . Sexual Activity: Not on file    Do you feel safe at home?  Yes, 53yo son lives with her.  Dietary issues and exercise activities: Current Exercise Habits:: The patient does not participate in regular exercise at present  Current Dietary habits:  Eating one meal a day, chicken and rice   Objective:    Today's Vitals   11/26/14 0807  BP: 129/87  Pulse: 67  Temp: 97.7 F (36.5 C)  TempSrc: Oral  Height: 5\' 3"  (1.6 m)  Weight: 170 lb (77.111 kg)   Body mass index is 30.12 kg/(m^2).   Gen: NAD, alert, cooperative with exam, NCAT EYES: EOMI, no scleral injection or icterus CV: NRRR, normal S1/S2, no murmur, DP pulses 2+ b/l Resp: CTABL, no wheezes, normal WOB Abd: +BS, soft, mild tenderness suprapubic area, no guarding or organomegaly Ext: No edema, warm Neuro: Alert and oriented, strength equal b/l UE and LE, coordination grossly normal MSK: normal muscle bulk   Activities of Daily Living In your present state of health, do you have any difficulty performing the following activities: 11/26/2014  Hearing? N  Vision? Y  Difficulty concentrating  or making decisions? N  Walking or climbing stairs? N  Dressing or bathing? N  Doing errands, shopping? N  Preparing Food and eating ? N  Using the Toilet? N  In the past six months, have you accidently leaked urine? Y  Do you have problems with loss of bowel control? N  Managing your Medications? N  Managing your Finances? N  Housekeeping or managing your Housekeeping? N    Are there smokers in your home (other than you)? No, pt smokes, trying to quit   Cardiac Risk Factors include: sedentary lifestyle  Depression Screen PHQ 2/9 Scores 11/26/2014 11/05/2013  PHQ - 2 Score 3 0  PHQ- 9 Score 14 -    Depression screen [facility] 2/9 11/26/2014  11/05/2013  Decreased Interest 1 0  Down, Depressed, Hopeless 2 0  PHQ - 2 Score 3 0  Altered sleeping 3 -  Tired, decreased energy 3 -  Change in appetite 3 -  Feeling bad or failure about yourself  1 -  Trouble concentrating 0 -  Moving slowly or fidgety/restless 1 -  Suicidal thoughts 0 -  PHQ-9 Score 14 -    Has support from ex-boyfriend, his mother,   Fall Risk Fall Risk  11/26/2014 11/05/2013  Falls in the past year? No No    Cognitive Function: MMSE - Mini Mental State Exam 11/26/2014  Orientation to time 5  Orientation to Place 5  Registration 3  Attention/ Calculation 2  Recall 2  Language- name 2 objects 2  Language- repeat 1  Language- follow 3 step command 3  Language- read & follow direction 1  Write a sentence 1  Copy design 1  Total score 26    Immunizations and Health Maintenance Immunization History  Administered Date(s) Administered  . Tdap 08/25/2013   Health Maintenance Due  Topic Date Due  . HIV Screening  09/22/1989  . INFLUENZA VACCINE  10/04/2014    Patient Care Team: Chevis Pretty, FNP as PCP - General (Nurse Practitioner)  Indicate any recent Medical Services you may have received from other than [facility] providers in the past year (date may be approximate).    Assessment:    Annual Wellness Visit    Screening Tests Health Maintenance  Topic Date Due  . HIV Screening  09/22/1989  . INFLUENZA VACCINE  10/04/2014  . PAP SMEAR  08/23/2017  . TETANUS/TDAP  08/26/2023        Plan:   During the course of the visit [place] was educated and counseled about the following appropriate screening and preventive services:   Vaccines to include influenza when available.   Cardiovascular disease screening--no symptoms, no family history, lipid panel done 2016  Diabetes screening--negative 2016  Mammogram--pt to call to set up next mammogram  PAP--UTD 2016  Glaucoma screening -- ophthalmology referral, decreased night vision,  increasingly blurry vision. Discussed not driving until eyes are evaluated.  Nutrition counseling   Goals    . Weight < 150 lb (68.04 kg)      Urinary urgency: POCT UA  Bipolar disorder: call to make app with psychiatrist. Gave list of therapists in the area that take medicaid. Pt denies thoughts of self harm or harm toward others.  STI screen: pt will go to health department as co-pay is less  Tobacco abuse: Smoking cessation counseling--completed today, pt trying gum, distraction, lollipops  Patient Instructions (the written plan) were given to the patient.   Eustaquio Maize, MD   11/26/2014

## 2014-11-26 NOTE — Patient Instructions (Addendum)
1-800-QUIT NOW  24 hour a day CRISIS NUMBER: [phone]   List of local counseling services:   [facility] [address] [place], [place] Does see children Does accept medicaid Will assess for Autism but not treat  [facility] [place]. [address] [place] [phone] Does see children  Does accept Medicaid Medication management- substance abuse- bipolar- grief- family-marriage- OCD- Anxiety- PTSD  [facility] Does see children Does accept medicaid They do perform psychological testing  [place] [address] [place] Schedule through [place]. [phone] Patient must call and make own appointment Does se children Does accept Medicaid  [facility] [place], [place] 7-10 accompanied by an adult, 11 and up by themselves Does accept Medicaid Will see patients with- substance abuse-ADHD-ADD-Bipolar-Domestic violence-Marriage counseling- Family Counseling and sexual abuse  [place] [facility] [address] [place] [phone] Does see children Does accept [facility] [place] [phone]  Dr. Sabra Heck-  Psychiatrist [address] [place], [place] Specializes in ADHD and addictions They do ADHD testing Suboxone clinic  [facility] [address] [place] Does Child psychological testing  [facility] [address] [place] [phone] Does Accept Medicaid Evaluates for [place] [address], [place] [address] [phone] Takes Medicaid WIll see children as young as 12

## 2014-12-23 DIAGNOSIS — F4310 Post-traumatic stress disorder, unspecified: Secondary | ICD-10-CM | POA: Diagnosis not present

## 2015-02-02 ENCOUNTER — Encounter: Payer: Self-pay | Admitting: Pediatrics

## 2015-02-02 ENCOUNTER — Ambulatory Visit (INDEPENDENT_AMBULATORY_CARE_PROVIDER_SITE_OTHER): Payer: Medicare Other | Admitting: Pediatrics

## 2015-02-02 VITALS — BP 113/71 | HR 66 | Temp 98.1°F | Ht 63.0 in | Wt 171.0 lb

## 2015-02-02 DIAGNOSIS — F3170 Bipolar disorder, currently in remission, most recent episode unspecified: Secondary | ICD-10-CM | POA: Diagnosis not present

## 2015-02-02 DIAGNOSIS — N3000 Acute cystitis without hematuria: Secondary | ICD-10-CM | POA: Diagnosis not present

## 2015-02-02 DIAGNOSIS — Z23 Encounter for immunization: Secondary | ICD-10-CM

## 2015-02-02 DIAGNOSIS — Z720 Tobacco use: Secondary | ICD-10-CM | POA: Insufficient documentation

## 2015-02-02 DIAGNOSIS — R300 Dysuria: Secondary | ICD-10-CM

## 2015-02-02 DIAGNOSIS — Z113 Encounter for screening for infections with a predominantly sexual mode of transmission: Secondary | ICD-10-CM

## 2015-02-02 DIAGNOSIS — K5900 Constipation, unspecified: Secondary | ICD-10-CM | POA: Insufficient documentation

## 2015-02-02 LAB — POCT URINALYSIS DIPSTICK
BILIRUBIN UA: NEGATIVE
Blood, UA: NEGATIVE
GLUCOSE UA: NEGATIVE
KETONES UA: NEGATIVE
LEUKOCYTES UA: NEGATIVE
NITRITE UA: NEGATIVE
Protein, UA: NEGATIVE
Spec Grav, UA: 1.01
Urobilinogen, UA: NEGATIVE
pH, UA: 6

## 2015-02-02 LAB — POCT UA - MICROSCOPIC ONLY
CASTS, UR, LPF, POC: NEGATIVE
CRYSTALS, UR, HPF, POC: NEGATIVE
Mucus, UA: NEGATIVE
RBC, urine, microscopic: NEGATIVE
Yeast, UA: NEGATIVE

## 2015-02-02 NOTE — Progress Notes (Signed)
Subjective:    Patient ID: Stacy Wolf, female    DOB: 18-Oct-1975, 39 y.o.   MRN: [id number]  CC: Burning Urination   HPI: Stacy Wolf is a 39 y.o. female presenting for Burning Urination  Burning is occasional, not every time she uses the bathroom  Drank lots of water last time and helped with symptoms Drinking sweet tea No fevers Burning going on for two days Some urgency No vaginal discharge No pain with intercourse Burning does not start after intercourse Has not gone to health department yet for testing, OK to get some done today Has been seeing her psychiatrist regularly, stopped haldol and started hydroxyzine Smokes about 10 cigarettes a day Family wants her to stop smoking, they placed a bet on it Having some constipation Mood has been stable    Depression screen [facility] 2/9 02/02/2015 11/26/2014 11/05/2013  Decreased Interest 0 1 0  Down, Depressed, Hopeless 0 2 0  PHQ - 2 Score 0 3 0  Altered sleeping - 3 -  Tired, decreased energy - 3 -  Change in appetite - 3 -  Feeling bad or failure about yourself  - 1 -  Trouble concentrating - 0 -  Moving slowly or fidgety/restless - 1 -  Suicidal thoughts - 0 -  PHQ-9 Score - 14 -     Relevant past medical, surgical, family and social history reviewed and updated as indicated. Interim medical history since our last visit reviewed. Allergies and medications reviewed and updated.    ROS: Per HPI unless specifically indicated above  Past Medical History Patient Active Problem List   Diagnosis Date Noted  . Tobacco abuse 02/02/2015  . Constipation 02/02/2015  . Gastroesophageal reflux disease without esophagitis 08/24/2014  . Hyperlipidemia LDL goal <100 09/14/2013  . Bipolar disorder ([place]) 08/25/2013    Current Outpatient Prescriptions  Medication Sig Dispense Refill  . lansoprazole (PREVACID) 30 MG capsule Take 1 capsule (30 mg total) by mouth 2 (two) times daily before a meal. 60 capsule 5  .  rizatriptan (MAXALT) 10 MG tablet Take 1 tablet (10 mg total) by mouth as needed for migraine. May repeat in 2 hours if needed 15 tablet 0  . hydrOXYzine (ATARAX/VISTARIL) 50 MG tablet 25 mg.  3   No current facility-administered medications for this visit.       Objective:    BP 113/71 mmHg  Pulse 66  Temp(Src) 98.1 F (36.7 C) (Oral)  Ht 5\' 3"  (1.6 m)  Wt 171 lb (77.565 kg)  BMI 30.30 kg/m2  Wt Readings from Last 3 Encounters:  02/02/15 171 lb (77.565 kg)  11/26/14 170 lb (77.111 kg)  08/24/14 166 lb 6.4 oz (75.479 kg)    Gen: NAD, alert, cooperative with exam, NCAT EYES: EOMI, no scleral injection or icterus ENT:   OP without erythema LYMPH: no cervical LAD CV: NRRR, normal S1/S2, no murmur Resp: CTABL, no wheezes, normal WOB Abd: +BS, soft, NTND. no guarding or organomegaly, no CVA tenderness Ext: No edema, warm Neuro: Alert and oriented, strength equal b/l UE and LE, coordination grossly normal MSK: normal muscle bulk Psych: full affect, normal speech     Assessment & Plan:    Stacy Wolf was seen today for burning urination.  Diagnoses and all orders for this visit:  Burning with urination -     POCT urinalysis dipstick -     POCT UA - Microscopic Only -     Urine culture  Routine screening for STI (  sexually transmitted infection) From last visit hadnt made it to health dept for screening. Will send below, pt wants to do HIV/RPR at [facility] because she thinks it will be cheaper. -     GC/Chlamydia Probe Amp  Constipation, unspecified constipation type Start trial of OTC meds, see pt instructions  Tobacco abuse more than 3 minutes spent on tobacco cessation counseling. Discussed strategies, alternate habits to form.  Bipolar affective disorder in remission [facility]) pt has gone back to her psychiatrist, stopped haldol and started hydroxyzine. Pt feels symptoms are well controlled.   Other orders -     Flu Vaccine QUAD 36+ mos IM   Follow up plan: Return in about 6  months (around 08/02/2015).  Stacy Found, MD [facility] 02/02/2015, 12:55 PM

## 2015-02-02 NOTE — Patient Instructions (Addendum)
kegel exercises three times a day to strengthen pelvic floor  Docusate 100mg  twice a day for constipation stool softener  Miralax every hour until having bowel movements

## 2015-02-04 LAB — GC/CHLAMYDIA PROBE AMP
Chlamydia trachomatis, NAA: NEGATIVE
Neisseria gonorrhoeae by PCR: NEGATIVE

## 2015-02-04 LAB — URINE CULTURE

## 2015-02-04 MED ORDER — NITROFURANTOIN MONOHYD MACRO 100 MG PO CAPS: 100.0000 mg | ORAL_CAPSULE | Freq: Two times a day (BID) | ORAL | Status: DC

## 2015-03-24 DIAGNOSIS — F4310 Post-traumatic stress disorder, unspecified: Secondary | ICD-10-CM | POA: Diagnosis not present

## 2015-04-18 ENCOUNTER — Ambulatory Visit (INDEPENDENT_AMBULATORY_CARE_PROVIDER_SITE_OTHER): Payer: Medicare Other | Admitting: Pharmacist

## 2015-04-18 ENCOUNTER — Encounter: Payer: Self-pay | Admitting: Pharmacist

## 2015-04-18 VITALS — BP 102/70 | HR 74 | Ht 63.5 in | Wt 169.0 lb

## 2015-04-18 DIAGNOSIS — Z1239 Encounter for other screening for malignant neoplasm of breast: Secondary | ICD-10-CM

## 2015-04-18 DIAGNOSIS — Z87898 Personal history of other specified conditions: Secondary | ICD-10-CM

## 2015-04-18 DIAGNOSIS — Z803 Family history of malignant neoplasm of breast: Secondary | ICD-10-CM

## 2015-04-18 DIAGNOSIS — G43909 Migraine, unspecified, not intractable, without status migrainosus: Secondary | ICD-10-CM | POA: Insufficient documentation

## 2015-04-18 DIAGNOSIS — Z0000 Encounter for general adult medical examination without abnormal findings: Secondary | ICD-10-CM

## 2015-04-18 NOTE — Patient Instructions (Addendum)
Stacy Wolf , Thank you for taking time to come for your Medicare Wellness Visit. I appreciate your ongoing commitment to your health goals. Please review the following plan we discussed and let me know if I can assist you in the future.   These are the goals we discussed: Eye exam recommended due to headaches  Dental Exam recommended - will check into area dentist that might take Medicaid Mammogram order sent Try Senokot-S for constipation (stool softner and vegetable laxative) take up to 2 tablets twice a day for 24 hours.  If not helping can try Miralax daily as needed.  If this does not help after 24 hours then call office. Try Gas X or simethicone for gas / bloating as needed Decrease amount of sugar used in sweet tea.   Increase non-starchy vegetables - carrots, green bean, squash, zucchini, tomatoes, onions, peppers, spinach and other green leafy vegetables, cabbage, lettuce, cucumbers, asparagus, okra (not fried), eggplant limit sugar and processed foods (cakes, cookies, ice cream, crackers and chips) Increase fresh fruit but limit serving sizes 1/2 cup or about the size of tennis or baseball limit red meat to no more than 1-2 times per week (serving size about the size of your palm) Choose whole grains / lean proteins - whole wheat bread, quinoa, whole grain rice (1/2 cup), fish, chicken, [place]  Increase exercise - walking is great.     This is a list of the screening recommended for you and due dates:  Health Maintenance  Topic Date Due  . HIV Screening  09/22/1989  . Mammogram  09/25/2014  . Flu Shot  10/04/2015  . Pap Smear  08/23/2017  . Tetanus Vaccine  08/26/2023    Health Maintenance, Female Adopting a healthy lifestyle and getting preventive care can go a long way to promote health and wellness. Talk with your health care provider about what schedule of regular examinations is right for you. This is a good chance for you to check in with your provider about disease  prevention and staying healthy. In between checkups, there are plenty of things you can do on your own. Experts have done a lot of research about which lifestyle changes and preventive measures are most likely to keep you healthy. Ask your health care provider for more information. WEIGHT AND DIET  Eat a healthy diet  Be sure to include plenty of vegetables, fruits, low-fat dairy products, and lean protein.  Do not eat a lot of foods high in solid fats, added sugars, or salt.  Get regular exercise. This is one of the most important things you can do for your health.  Most adults should exercise for at least 150 minutes each week. The exercise should increase your heart rate and make you sweat (moderate-intensity exercise).  Most adults should also do strengthening exercises at least twice a week. This is in addition to the moderate-intensity exercise.  Maintain a healthy weight  Body mass index (BMI) is a measurement that can be used to identify possible weight problems. It estimates body fat based on height and weight. Your health care provider can help determine your BMI and help you achieve or maintain a healthy weight.  For females 19 years of age and older:   A BMI below 18.5 is considered underweight.  A BMI of 18.5 to 24.9 is normal.  A BMI of 25 to 29.9 is considered overweight.  A BMI of 30 and above is considered obese.  Watch levels of cholesterol and blood lipids  You should start having your blood tested for lipids and cholesterol at 41 years of age, then have this test every 5 years.  You may need to have your cholesterol levels checked more often if:  Your lipid or cholesterol levels are high.  You are older than 41 years of age.  You are at high risk for heart disease.  CANCER SCREENING   Lung Cancer  Lung cancer screening is recommended for adults 4-27 years old who are at high risk for lung cancer because of a history of smoking.  A yearly low-dose  CT scan of the lungs is recommended for people who:  Currently smoke.  Have quit within the past 15 years.  Have at least a 30-pack-year history of smoking. A pack year is smoking an average of one pack of cigarettes a day for 1 year.  Yearly screening should continue until it has been 15 years since you quit.  Yearly screening should stop if you develop a health problem that would prevent you from having lung cancer treatment.  Breast Cancer  Practice breast self-awareness. This means understanding how your breasts normally appear and feel.  It also means doing regular breast self-exams. Let your health care provider know about any changes, no matter how small.  If you are in your 20s or 30s, you should have a clinical breast exam (CBE) by a health care provider every 1-3 years as part of a regular health exam.  If you are 47 or older, have a CBE every year. Also consider having a breast X-ray (mammogram) every year.  If you have a family history of breast cancer, talk to your health care provider about genetic screening.  If you are at high risk for breast cancer, talk to your health care provider about having an MRI and a mammogram every year.  Breast cancer gene (BRCA) assessment is recommended for women who have family members with BRCA-related cancers. BRCA-related cancers include:  Breast.  Ovarian.  Tubal.  Peritoneal cancers.  Results of the assessment will determine the need for genetic counseling and BRCA1 and BRCA2 testing. Cervical Cancer Your health care provider may recommend that you be screened regularly for cancer of the pelvic organs (ovaries, uterus, and vagina). This screening involves a pelvic examination, including checking for microscopic changes to the surface of your cervix (Pap test). You may be encouraged to have this screening done every 3 years, beginning at age 78.  For women ages 19-65, health care providers may recommend pelvic exams and Pap  testing every 3 years, or they may recommend the Pap and pelvic exam, combined with testing for human papilloma virus (HPV), every 5 years. Some types of HPV increase your risk of cervical cancer. Testing for HPV may also be done on women of any age with unclear Pap test results.  Other health care providers may not recommend any screening for nonpregnant women who are considered low risk for pelvic cancer and who do not have symptoms. Ask your health care provider if a screening pelvic exam is right for you.  If you have had past treatment for cervical cancer or a condition that could lead to cancer, you need Pap tests and screening for cancer for at least 20 years after your treatment. If Pap tests have been discontinued, your risk factors (such as having a new sexual partner) need to be reassessed to determine if screening should resume. Some women have medical problems that increase the chance of getting cervical cancer. In  these cases, your health care provider may recommend more frequent screening and Pap tests. Colorectal Cancer  This type of cancer can be detected and often prevented.  Routine colorectal cancer screening usually begins at 41 years of age and continues through 41 years of age.  Your health care provider may recommend screening at an earlier age if you have risk factors for colon cancer.  Your health care provider may also recommend using home test kits to check for hidden blood in the stool.  A small camera at the end of a tube can be used to examine your colon directly (sigmoidoscopy or colonoscopy). This is done to check for the earliest forms of colorectal cancer.  Routine screening usually begins at age 90.  Direct examination of the colon should be repeated every 5-10 years through 41 years of age. However, you may need to be screened more often if early forms of precancerous polyps or small growths are found. Skin Cancer  Check your skin from head to toe  regularly.  Tell your health care provider about any new moles or changes in moles, especially if there is a change in a mole's shape or color.  Also tell your health care provider if you have a mole that is larger than the size of a pencil eraser.  Always use sunscreen. Apply sunscreen liberally and repeatedly throughout the day.  Protect yourself by wearing long sleeves, pants, a wide-brimmed hat, and sunglasses whenever you are outside. HEART DISEASE, DIABETES, AND HIGH BLOOD PRESSURE   High blood pressure causes heart disease and increases the risk of stroke. High blood pressure is more likely to develop in:  People who have blood pressure in the high end of the normal range (130-139/85-89 mm Hg).  People who are overweight or obese.  People who are African American.  If you are 69-104 years of age, have your blood pressure checked every 3-5 years. If you are 32 years of age or older, have your blood pressure checked every year. You should have your blood pressure measured twice--once when you are at a hospital or clinic, and once when you are not at a hospital or clinic. Record the average of the two measurements. To check your blood pressure when you are not at a hospital or clinic, you can use:  An automated blood pressure machine at a pharmacy.  A home blood pressure monitor.  If you are between 35 years and 29 years old, ask your health care provider if you should take aspirin to prevent strokes.  Have regular diabetes screenings. This involves taking a blood sample to check your fasting blood sugar level.  If you are at a normal weight and have a low risk for diabetes, have this test once every three years after 41 years of age.  If you are overweight and have a high risk for diabetes, consider being tested at a younger age or more often. PREVENTING INFECTION  Hepatitis B  If you have a higher risk for hepatitis B, you should be screened for this virus. You are considered  at high risk for hepatitis B if:  You were born in a country where hepatitis B is common. Ask your health care provider which countries are considered high risk.  Your parents were born in a high-risk country, and you have not been immunized against hepatitis B (hepatitis B vaccine).  You have HIV or AIDS.  You use needles to inject street drugs.  You live with someone who has  hepatitis B.  You have had sex with someone who has hepatitis B.  You get hemodialysis treatment.  You take certain medicines for conditions, including cancer, organ transplantation, and autoimmune conditions. Hepatitis C  Blood testing is recommended for:  Everyone born from [birth year] through 1966.  Anyone with known risk factors for hepatitis C. Sexually transmitted infections (STIs)  You should be screened for sexually transmitted infections (STIs) including gonorrhea and chlamydia if:  You are sexually active and are younger than 41 years of age.  You are older than 41 years of age and your health care provider tells you that you are at risk for this type of infection.  Your sexual activity has changed since you were last screened and you are at an increased risk for chlamydia or gonorrhea. Ask your health care provider if you are at risk.  If you do not have HIV, but are at risk, it may be recommended that you take a prescription medicine daily to prevent HIV infection. This is called pre-exposure prophylaxis (PrEP). You are considered at risk if:  You are sexually active and do not regularly use condoms or know the HIV status of your partner(s).  You take drugs by injection.  You are sexually active with a partner who has HIV. Talk with your health care provider about whether you are at high risk of being infected with HIV. If you choose to begin PrEP, you should first be tested for HIV. You should then be tested every 3 months for as long as you are taking PrEP.  PREGNANCY   If you are  premenopausal and you may become pregnant, ask your health care provider about preconception counseling.  If you may become pregnant, take 400 to 800 micrograms (mcg) of folic acid every day.  If you want to prevent pregnancy, talk to your health care provider about birth control (contraception). OSTEOPOROSIS AND MENOPAUSE   Osteoporosis is a disease in which the bones lose minerals and strength with aging. This can result in serious bone fractures. Your risk for osteoporosis can be identified using a bone density scan.  If you are 47 years of age or older, or if you are at risk for osteoporosis and fractures, ask your health care provider if you should be screened.  Ask your health care provider whether you should take a calcium or vitamin D supplement to lower your risk for osteoporosis.  Menopause may have certain physical symptoms and risks.  Hormone replacement therapy may reduce some of these symptoms and risks. Talk to your health care provider about whether hormone replacement therapy is right for you.  HOME CARE INSTRUCTIONS   Schedule regular health, dental, and eye exams.  Stay current with your immunizations.   Do not use any tobacco products including cigarettes, chewing tobacco, or electronic cigarettes.  If you are pregnant, do not drink alcohol.  If you are breastfeeding, limit how much and how often you drink alcohol.  Limit alcohol intake to no more than 1 drink per day for nonpregnant women. One drink equals 12 ounces of beer, 5 ounces of wine, or 1 ounces of hard liquor.  Do not use street drugs.  Do not share needles.  Ask your health care provider for help if you need support or information about quitting drugs.  Tell your health care provider if you often feel depressed.  Tell your health care provider if you have ever been abused or do not feel safe at home.   This information  is not intended to replace advice given to you by your health care  provider. Make sure you discuss any questions you have with your health care provider.   Document Released: 09/04/2010 Document Revised: 03/12/2014 Document Reviewed: 01/21/2013 Elsevier Interactive Patient Education [employer].

## 2015-04-18 NOTE — Progress Notes (Signed)
Patient ID: [name], female   DOB: 22-Feb-1976, 40 y.o.   MRN: [id number]    Subjective:   [name] is a black 40 y.o. female who presents for a subsequent Medicare Annual Wellness Visit. Ms. Clarey is WDWN and is in NAD.   She is disabled.  She is not married but has the same significant other for the last 6 years.  2 of her 3 children live with her (ages 47 and 44 yo)  Review of Systems  Review of Systems  Constitutional: Negative.   Eyes: Positive for blurred vision.  Respiratory: Negative.   Cardiovascular: Negative.   Gastrointestinal: Positive for constipation.  Genitourinary: Negative.   Musculoskeletal: Negative.   Skin: Negative.   Neurological: Positive for headaches.  Endo/Heme/Allergies: Negative.   Psychiatric/Behavioral: Negative.     Current Medications (verified) Outpatient Encounter Prescriptions as of 04/18/2015  Medication Sig  . lansoprazole (PREVACID) 30 MG capsule Take 1 capsule (30 mg total) by mouth 2 (two) times daily before a meal.  . rizatriptan (MAXALT) 10 MG tablet Take 1 tablet (10 mg total) by mouth as needed for migraine. May repeat in 2 hours if needed  . [DISCONTINUED] hydrOXYzine (ATARAX/VISTARIL) 50 MG tablet 25 mg. Reported on 04/18/2015  . [DISCONTINUED] nitrofurantoin, macrocrystal-monohydrate, (MACROBID) 100 MG capsule Take 1 capsule (100 mg total) by mouth 2 (two) times daily. 1 po BId   No facility-administered encounter medications on file as of 04/18/2015.    Allergies (verified) Wellbutrin; Zoloft; Haloperidol and related; Imitrex; and Latuda   History: Past Medical History  Diagnosis Date  . Migraines   . Bipolar 1 disorder ([place])   . Anxiety   . Schizophrenia [facility])    Past Surgical History  Procedure Laterality Date  . Arm surgery      tendonitis  . Tubal ligation     Family History  Problem Relation Age of Onset  . Cancer Mother 37    breast CA at 18 and uterine CA at 54  . Hypertension Mother   . Kidney  disease Mother     kidney transplant  . Cancer Sister 47    uterine  . Cancer Sister 21    uterine  . Diabetes Father   . Cancer Maternal Aunt     breast   Social History   Occupational History  . Not on file.   Social History Main Topics  . Smoking status: Current Every Day Smoker -- 0.25 packs/day    Types: Cigarettes  . Smokeless tobacco: Never Used  . Alcohol Use: No  . Drug Use: No  . Sexual Activity: Yes    Do you feel safe at home?  Yes  Dietary issues and exercise activities: Current Exercise Habits:: The patient does not participate in regular exercise at present, Limited by:: Other - see comments (weather)  Current Dietary habits:  Eats oatmeal or cereal for breakfast with whole milk;  Tries to limit bread;  Drinks sweet tea (use about 1 and 1/2 cups per gallon); rice and beans ; no pork  Objective:    Today's Vitals   04/18/15 0918  BP: 102/70  Pulse: 74  Height: 5' 3.5" (1.613 m)  Weight: 169 lb (76.658 kg)  PainSc: 0-No pain   Body mass index is 29.46 kg/(m^2).  Activities of Daily Living In your present state of health, do you have any difficulty performing the following activities: 04/18/2015 11/26/2014  Hearing? N N  Vision? Y Y  Difficulty concentrating or making  decisions? N N  Walking or climbing stairs? N N  Dressing or bathing? N N  Doing errands, shopping? N N  Preparing Food and eating ? N N  Using the Toilet? N N  In the past six months, have you accidently leaked urine? N Y  Do you have problems with loss of bowel control? N N  Managing your Medications? N N  Managing your Finances? N N  Housekeeping or managing your Housekeeping? N N    Are there smokers in your home (other than you)? Yes   Cardiac Risk Factors include: dyslipidemia;sedentary lifestyle;smoking/ tobacco exposure  Depression Screen PHQ 2/9 Scores 04/18/2015 02/02/2015 11/26/2014 11/05/2013  PHQ - 2 Score 6 0 3 0  PHQ- 9 Score 22 - 14 -    Fall Risk Fall Risk   04/18/2015 02/02/2015 11/26/2014 11/05/2013  Falls in the past year? No No No No    Cognitive Function: MMSE - Mini Mental State Exam 04/18/2015 11/26/2014  Orientation to time 5 5  Orientation to Place 5 5  Registration 3 3  Attention/ Calculation 5 2  Recall 3 2  Language- name 2 objects 2 2  Language- repeat 1 1  Language- follow 3 step command 3 3  Language- read & follow direction 1 1  Write a sentence 1 1  Copy design 1 1  Total score 30 26    Immunizations and Health Maintenance Immunization History  Administered Date(s) Administered  . Influenza,inj,Quad PF,36+ Mos 02/02/2015  . Tdap 08/25/2013   Health Maintenance Due  Topic Date Due  . HIV Screening  09/22/1989  . MAMMOGRAM  09/25/2014    Patient Care Team: Chevis Pretty, FNP as PCP - General (Nurse Practitioner)  Patient sees psychologist and psychiatrist at [facility] any recent [place] you may have received from other than [facility] providers in the past year (date may be approximate).    Assessment:    Annual Wellness Visit  Constipation Headache - patient in need of eye exam but cannot afford   Screening Tests Health Maintenance  Topic Date Due  . HIV Screening  09/22/1989  . MAMMOGRAM  09/25/2014  . INFLUENZA VACCINE  10/04/2015  . PAP SMEAR  08/23/2017  . TETANUS/TDAP  08/26/2023        Plan:   During the course of the visit Ricquel was educated and counseled about the following appropriate screening and preventive services:   Vaccines to include Pneumoccal, Influenza, Hepatitis B, Td, Zostavax - UTD with all required vaccinations  Colorectal cancer screening - not required currently but if constipation continues recommend colonoscopy  Try Senokot-S for constipation (stool softner and vegetable laxative) take up to 2 tablets twice a day for 24 hours.  If not helping can try Miralax daily as needed.  If this does not help after 24 hours then call office.  Try Gas X or  simethicone for gas / bloating as needed  Cardiovascular disease screening - not currently indicated  Diabetes screening - Last FBG was WNL  Bone Denisty / Osteoporosis Screening - No currently indicated  Mammogram - with family history of breast cancer - repeat mammogram recommended yearly.  Order sent.  PAP - UTD   Eye Exam - we are looking into resources for patient assistance in paying for eye exam (currenly cost per patient for exam with Dr Marin Comment or Dr Rona Ravens would be $175 and she cannot afford)  Dental Exam - patient given number for [facility] - [phone]  Nutrition  counseling - discussed limiting high fat foods and sugar in diet.  Specifically recommended she decrease amount of sugar in tea or switch to unsweetened tea.  Also encouraged to increase vegetable and fruit intake.   Smoking cessation counseling - patient has reduced amount she is smoking per day. Encouraged her to continue to decreased.  At this time Chantix, Zyban and nicotine patches are not appropriated due to [place].  Discussed trigger avoidance.  Advanced Directives - information given  Increase physical activity - goal is 150 minutes per week but start with just 10 - 15  minutes daily and increase as able.      Patient Instructions (the written plan) were given to the patient.   Cherre Robins, [facility]   04/18/2015

## 2015-06-15 ENCOUNTER — Ambulatory Visit (INDEPENDENT_AMBULATORY_CARE_PROVIDER_SITE_OTHER): Payer: Medicare Other | Admitting: Nurse Practitioner

## 2015-06-15 ENCOUNTER — Encounter: Payer: Self-pay | Admitting: Nurse Practitioner

## 2015-06-15 VITALS — BP 120/81 | HR 75 | Temp 98.6°F | Ht 63.5 in | Wt 173.6 lb

## 2015-06-15 DIAGNOSIS — L732 Hidradenitis suppurativa: Secondary | ICD-10-CM | POA: Diagnosis not present

## 2015-06-15 MED ORDER — SULFAMETHOXAZOLE-TRIMETHOPRIM 800-160 MG PO TABS: 1.0000 | ORAL_TABLET | Freq: Two times a day (BID) | ORAL | Status: DC

## 2015-06-15 MED ORDER — FLUCONAZOLE 150 MG PO TABS: 150.0000 mg | ORAL_TABLET | Freq: Once | ORAL | Status: DC

## 2015-06-15 NOTE — Patient Instructions (Signed)

## 2015-06-15 NOTE — Progress Notes (Signed)
   Subjective:    Patient ID: Stacy Wolf, female    DOB: Nov 10, 1975, 40 y.o.   MRN: [id number]  HPI  Patient in c/o a boil in left axillia- hurts to touch- no drainage as of yet- had a similar lesion in groin last month but had no antibiotic.   Review of Systems  Constitutional: Negative.   HENT: Negative.   Eyes: Positive for pain.  Respiratory: Negative.   Cardiovascular: Negative.   Genitourinary: Negative.   Neurological: Negative.   Psychiatric/Behavioral: Negative.   All other systems reviewed and are negative.      Objective:   Physical Exam  Constitutional: She is oriented to person, place, and time. She appears well-developed and well-nourished. No distress.  Cardiovascular: Normal rate, regular rhythm and normal heart sounds.   Pulmonary/Chest: Effort normal and breath sounds normal.  Neurological: She is alert and oriented to person, place, and time.  Skin: Skin is warm.  Erythematous superficial nodule right axillary area  Psychiatric: She has a normal mood and affect. Her behavior is normal. Judgment and thought content normal.   BP 120/81 mmHg  Pulse 75  Temp(Src) 98.6 F (37 C) (Oral)  Ht 5' 3.5" (1.613 m)  Wt 173 lb 9.6 oz (78.744 kg)  BMI 30.27 kg/m2        Assessment & Plan:   1. Hidradenitis    Meds ordered this encounter  Medications  . sulfamethoxazole-trimethoprim (BACTRIM DS) 800-160 MG tablet    Sig: Take 1 tablet by mouth 2 (two) times daily.    Dispense:  20 tablet    Refill:  0    Order Specific Question:  Supervising Provider    Answer:  Chipper Herb [[id number]]  . fluconazole (DIFLUCAN) 150 MG tablet    Sig: Take 1 tablet (150 mg total) by mouth once.    Dispense:  1 tablet    Refill:  0    Order Specific Question:  Supervising Provider    Answer:  Chipper Herb [[id number]]   Warm compresses RTO if not improving  Mary-Margaret Hassell Done, FNP

## 2015-07-14 DIAGNOSIS — F4310 Post-traumatic stress disorder, unspecified: Secondary | ICD-10-CM | POA: Diagnosis not present

## 2015-09-19 ENCOUNTER — Telehealth: Payer: Self-pay | Admitting: Nurse Practitioner

## 2015-09-19 NOTE — Telephone Encounter (Signed)
Scheduled

## 2015-11-28 ENCOUNTER — Encounter: Payer: Medicare Other | Admitting: *Deleted

## 2015-12-09 ENCOUNTER — Ambulatory Visit (INDEPENDENT_AMBULATORY_CARE_PROVIDER_SITE_OTHER): Payer: Medicare Other | Admitting: Family Medicine

## 2015-12-09 ENCOUNTER — Encounter: Payer: Self-pay | Admitting: Family Medicine

## 2015-12-09 VITALS — BP 124/85 | HR 65 | Temp 98.2°F | Ht 63.5 in | Wt 175.0 lb

## 2015-12-09 DIAGNOSIS — G43009 Migraine without aura, not intractable, without status migrainosus: Secondary | ICD-10-CM

## 2015-12-09 MED ORDER — PROPRANOLOL HCL ER 80 MG PO CP24: 80.0000 mg | ORAL_CAPSULE | Freq: Every day | ORAL | 2 refills | Status: DC

## 2015-12-09 NOTE — Progress Notes (Signed)
BP 124/85   Pulse 65   Temp 98.2 F (36.8 C) (Oral)   Ht 5' 3.5" (1.613 m)   Wt 175 lb (79.4 kg)   LMP 12/03/2015 (Exact Date)   BMI 30.51 kg/m    Subjective:    Patient ID: Stacy Wolf, female    DOB: 20-Oct-1975, 40 y.o.   MRN: [id number]  HPI: Stacy Wolf is a 40 y.o. female presenting on 12/09/2015 for Hypertension (pt here today c/o headaches and had 2 elevated BP readings at [facility] 132/96 and the other one was 136/92.)   HPI Elevated blood pressure and headache Patient comes in today for elevated blood pressure and headaches. She has been having recurrent headaches almost daily or every other day on the left frontal region and going through her left temple region. She describes the pain as a pulsating and pressure. She does get migraines that are more severe and the migraines typically lasts 4-8 hours but she uses Excedrin to help stop them. She has never been on any kind of prophylaxis medication for blood pressure. 2 times in the past day she has checked her blood pressure at home and it has been in the 130s over 90s while she is having these headaches. She was concerned that the blood pressure was causing her headaches versus the headaches causing the blood pressure go up. She denies any visual disturbances but does have photophobia.  Relevant past medical, surgical, family and social history reviewed and updated as indicated. Interim medical history since our last visit reviewed. Allergies and medications reviewed and updated.  Review of Systems  Constitutional: Negative for chills and fever.  HENT: Negative for congestion, ear discharge and ear pain.   Eyes: Negative for redness and visual disturbance.  Respiratory: Negative for chest tightness and shortness of breath.   Cardiovascular: Negative for chest pain and leg swelling.  Genitourinary: Negative for difficulty urinating and dysuria.  Musculoskeletal: Negative for back pain and gait problem.  Skin: Negative  for rash.  Neurological: Positive for headaches. Negative for dizziness and light-headedness.  Psychiatric/Behavioral: Negative for agitation and behavioral problems.  All other systems reviewed and are negative.   Per HPI unless specifically indicated above     Medication List       Accurate as of 12/09/15 11:19 AM. Always use your most recent med list.          lansoprazole 30 MG capsule Commonly known as:  PREVACID Take 1 capsule (30 mg total) by mouth 2 (two) times daily before a meal.   propranolol ER 80 MG 24 hr capsule Commonly known as:  INDERAL LA Take 1 capsule (80 mg total) by mouth daily.          Objective:    BP 124/85   Pulse 65   Temp 98.2 F (36.8 C) (Oral)   Ht 5' 3.5" (1.613 m)   Wt 175 lb (79.4 kg)   LMP 12/03/2015 (Exact Date)   BMI 30.51 kg/m   Wt Readings from Last 3 Encounters:  12/09/15 175 lb (79.4 kg)  06/15/15 173 lb 9.6 oz (78.7 kg)  04/18/15 169 lb (76.7 kg)    Physical Exam  Constitutional: She is oriented to person, place, and time. She appears well-developed and well-nourished. No distress.  HENT:  Right Ear: External ear normal.  Left Ear: External ear normal.  Nose: Nose normal.  Mouth/Throat: Oropharynx is clear and moist. No oropharyngeal exudate.  Eyes: Conjunctivae are normal.  Neck: Neck supple.  No thyromegaly present.  Cardiovascular: Normal rate, regular rhythm, normal heart sounds and intact distal pulses.   No murmur heard. Pulmonary/Chest: Effort normal and breath sounds normal. No respiratory distress. She has no wheezes.  Musculoskeletal: Normal range of motion. She exhibits no edema or tenderness.  Lymphadenopathy:    She has no cervical adenopathy.  Neurological: She is alert and oriented to person, place, and time. She displays normal reflexes. No cranial nerve deficit. She exhibits normal muscle tone. Coordination normal.  Skin: Skin is warm and dry. No rash noted. She is not diaphoretic.  Psychiatric:  She has a normal mood and affect. Her behavior is normal.  Nursing note and vitals reviewed.     Assessment & Plan:   Problem List Items Addressed This Visit      Cardiovascular and Mediastinum   Migraine headache - Primary   Relevant Medications   propranolol ER (INDERAL LA) 80 MG 24 hr capsule    Other Visit Diagnoses   None.      Follow up plan: Return in about 4 weeks (around 01/06/2016), or if symptoms worsen or fail to improve, for Well woman exam and Pap and recheck migraines.  Counseling provided for all of the vaccine components No orders of the defined types were placed in this encounter.   Caryl Pina, MD [facility] 12/09/2015, 11:19 AM

## 2016-01-16 ENCOUNTER — Ambulatory Visit (INDEPENDENT_AMBULATORY_CARE_PROVIDER_SITE_OTHER): Payer: Medicare Other | Admitting: Family Medicine

## 2016-01-16 ENCOUNTER — Encounter: Payer: Self-pay | Admitting: Family Medicine

## 2016-01-16 VITALS — BP 121/81 | HR 66 | Temp 98.2°F | Ht 63.5 in | Wt 176.0 lb

## 2016-01-16 DIAGNOSIS — I10 Essential (primary) hypertension: Secondary | ICD-10-CM | POA: Insufficient documentation

## 2016-01-16 DIAGNOSIS — E785 Hyperlipidemia, unspecified: Secondary | ICD-10-CM

## 2016-01-16 DIAGNOSIS — N921 Excessive and frequent menstruation with irregular cycle: Secondary | ICD-10-CM | POA: Diagnosis not present

## 2016-01-16 DIAGNOSIS — Z01419 Encounter for gynecological examination (general) (routine) without abnormal findings: Secondary | ICD-10-CM

## 2016-01-16 LAB — CBC WITH DIFFERENTIAL/PLATELET
Basophils Absolute: 0 10*3/uL (ref 0.0–0.2)
Basos: 0 %
EOS (ABSOLUTE): 0.2 10*3/uL (ref 0.0–0.4)
Eos: 3 %
HEMOGLOBIN: 14 g/dL (ref 11.1–15.9)
Hematocrit: 41 % (ref 34.0–46.6)
IMMATURE GRANS (ABS): 0 10*3/uL (ref 0.0–0.1)
IMMATURE GRANULOCYTES: 0 %
LYMPHS: 38 %
Lymphocytes Absolute: 2.8 10*3/uL (ref 0.7–3.1)
MCH: 31 pg (ref 26.6–33.0)
MCHC: 34.1 g/dL (ref 31.5–35.7)
MCV: 91 fL (ref 79–97)
MONOCYTES: 9 %
Monocytes Absolute: 0.6 10*3/uL (ref 0.1–0.9)
NEUTROS ABS: 3.6 10*3/uL (ref 1.4–7.0)
NEUTROS PCT: 50 %
PLATELETS: 245 10*3/uL (ref 150–379)
RBC: 4.51 x10E6/uL (ref 3.77–5.28)
RDW: 14.2 % (ref 12.3–15.4)
WBC: 7.2 10*3/uL (ref 3.4–10.8)

## 2016-01-16 LAB — CMP14+EGFR
ALBUMIN: 4.6 g/dL (ref 3.5–5.5)
ALT: 5 IU/L (ref 0–32)
AST: 12 IU/L (ref 0–40)
Albumin/Globulin Ratio: 1.6 (ref 1.2–2.2)
Alkaline Phosphatase: 81 IU/L (ref 39–117)
BUN/Creatinine Ratio: 12 (ref 9–23)
BUN: 11 mg/dL (ref 6–24)
Bilirubin Total: 0.6 mg/dL (ref 0.0–1.2)
CALCIUM: 9.8 mg/dL (ref 8.7–10.2)
CHLORIDE: 101 mmol/L (ref 96–106)
CO2: 23 mmol/L (ref 18–29)
Creatinine, Ser: 0.91 mg/dL (ref 0.57–1.00)
GFR calc non Af Amer: 79 mL/min/{1.73_m2} (ref >59)
GFR, EST AFRICAN AMERICAN: 91 mL/min/{1.73_m2} (ref >59)
GLUCOSE: 95 mg/dL (ref 65–99)
Globulin, Total: 2.8 g/dL (ref 1.5–4.5)
Potassium: 4.1 mmol/L (ref 3.5–5.2)
Sodium: 141 mmol/L (ref 134–144)
TOTAL PROTEIN: 7.4 g/dL (ref 6.0–8.5)

## 2016-01-16 LAB — LIPID PANEL
CHOL/HDL RATIO: 4.6 ratio — AB (ref 0.0–4.4)
Cholesterol, Total: 226 mg/dL — ABNORMAL HIGH (ref 100–199)
HDL: 49 mg/dL (ref >39)
LDL Calculated: 149 mg/dL — ABNORMAL HIGH (ref 0–99)
Triglycerides: 142 mg/dL (ref 0–149)
VLDL Cholesterol Cal: 28 mg/dL (ref 5–40)

## 2016-01-16 MED ORDER — MEDROXYPROGESTERONE ACETATE 150 MG/ML IM SUSP: 150.0000 mg | INTRAMUSCULAR | 3 refills | Status: DC

## 2016-01-16 MED ORDER — MEDROXYPROGESTERONE ACETATE 150 MG/ML IM SUSP
150.0000 mg | Freq: Once | INTRAMUSCULAR | Status: AC
  Administered 2016-01-16: 150 mg via INTRAMUSCULAR

## 2016-01-16 NOTE — Progress Notes (Addendum)
BP 121/81   Pulse 66   Temp 98.2 F (36.8 C) (Oral)   Ht 5' 3.5" (1.613 m)   Wt 176 lb (79.8 kg)   LMP 12/26/2015 (Approximate)   BMI 30.69 kg/m    Subjective:    Patient ID: Stacy Wolf, female    DOB: 1976/02/13, 40 y.o.   MRN: [id number]  HPI: Stacy Wolf is a 40 y.o. female presenting on 01/16/2016 for Gynecologic Exam (pt here today for pap and also following up on her BP after starting Propanolol)   HPI Pelvic and breast exam and Pap. Patient is coming in for routine well woman exam. She is having periods but they are quite frequent now and are heavier. She says over the past year they have started to come sporadically and sometimes every 2 weeks. She says that her mother was 24 or 26 when she went through menopause and so she thinks she may be starting the head through menopause herself. She denies any vaginal discharge or bleeding currently today. She denies any abdominal pain or pelvic pain.   Hyperlipidemia and hypertension She is also coming in for fasting labs today for both hypertension and cholesterol. Her blood pressure today is 121/81. She is currently on propranolol for both headaches and blood pressure.  Relevant past medical, surgical, family and social history reviewed and updated as indicated. Interim medical history since our last visit reviewed. Allergies and medications reviewed and updated.  Review of Systems  Constitutional: Negative for chills and fever.  HENT: Negative for congestion, ear discharge and ear pain.   Eyes: Negative for redness and visual disturbance.  Respiratory: Negative for chest tightness and shortness of breath.   Cardiovascular: Negative for chest pain and leg swelling.  Gastrointestinal: Negative for abdominal pain, diarrhea, nausea and vomiting.  Genitourinary: Positive for menstrual problem. Negative for difficulty urinating, dysuria, flank pain, frequency, pelvic pain, vaginal discharge and vaginal pain.  Musculoskeletal:  Negative for back pain and gait problem.  Skin: Negative for rash.  Neurological: Negative for dizziness, light-headedness and headaches.  Psychiatric/Behavioral: Negative for agitation and behavioral problems.  All other systems reviewed and are negative.   Per HPI unless specifically indicated above     Medication List       Accurate as of 01/16/16 12:04 PM. Always use your most recent med list.          lansoprazole 30 MG capsule Commonly known as:  PREVACID Take 1 capsule (30 mg total) by mouth 2 (two) times daily before a meal.   propranolol ER 80 MG 24 hr capsule Commonly known as:  INDERAL LA Take 1 capsule (80 mg total) by mouth daily.          Objective:    BP 121/81   Pulse 66   Temp 98.2 F (36.8 C) (Oral)   Ht 5' 3.5" (1.613 m)   Wt 176 lb (79.8 kg)   LMP 12/26/2015 (Approximate)   BMI 30.69 kg/m   Wt Readings from Last 3 Encounters:  01/16/16 176 lb (79.8 kg)  12/09/15 175 lb (79.4 kg)  06/15/15 173 lb 9.6 oz (78.7 kg)    Physical Exam  Constitutional: She is oriented to person, place, and time. She appears well-developed and well-nourished. No distress.  Eyes: Conjunctivae are normal.  Neck: Neck supple. No thyromegaly present.  Cardiovascular: Normal rate, regular rhythm, normal heart sounds and intact distal pulses.   No murmur heard. Pulmonary/Chest: Effort normal and breath sounds normal. No respiratory  distress. She has no wheezes. She has no rales. Right breast exhibits no inverted nipple, no mass, no nipple discharge, no skin change and no tenderness. Left breast exhibits no inverted nipple, no mass, no nipple discharge, no skin change and no tenderness. Breasts are symmetrical.  Abdominal: Soft. Bowel sounds are normal. She exhibits no distension. There is no tenderness. There is no rebound and no guarding.  Genitourinary: Vagina normal and uterus normal. No breast swelling, tenderness, discharge or bleeding. Pelvic exam was performed with  patient supine. There is no rash or lesion on the right labia. There is no rash or lesion on the left labia. Uterus is not deviated, not enlarged, not fixed and not tender. Cervix exhibits no motion tenderness, no discharge and no friability. Right adnexum displays no mass and no tenderness. Left adnexum displays no mass and no tenderness.  Musculoskeletal: Normal range of motion. She exhibits no edema or tenderness.  Lymphadenopathy:    She has no cervical adenopathy.    She has no axillary adenopathy.  Neurological: She is alert and oriented to person, place, and time. Coordination normal.  Skin: Skin is warm and dry. No rash noted. She is not diaphoretic.  Psychiatric: She has a normal mood and affect. Her behavior is normal.  Nursing note and vitals reviewed.     Assessment & Plan:   Problem List Items Addressed This Visit      Cardiovascular and Mediastinum   Essential hypertension, benign   Relevant Orders   CMP14+EGFR (Completed)     Other   Hyperlipidemia LDL goal <100 - Primary   Relevant Orders   Lipid panel (Completed)    Other Visit Diagnoses    Well woman exam with routine gynecological exam       Relevant Medications   medroxyPROGESTERone (DEPO-PROVERA) injection 150 mg (Completed)   Other Relevant Orders   CBC with Differential/Platelet (Completed)   Pap IG, rfx HPV all pth (Completed)   Menorrhagia with irregular cycle       Patient may be starting early menopause, will do Depo-Provera shots to help with excess bleeding and cycles for a period of time until she gets through menopaus   Relevant Medications   medroxyPROGESTERone (DEPO-PROVERA) 150 MG/ML injection   medroxyPROGESTERone (DEPO-PROVERA) injection 150 mg (Completed)       Follow up plan: Return in about 6 months (around 07/15/2016), or if symptoms worsen or fail to improve, for Hypertension recheck.  Counseling provided for all of the vaccine components Orders Placed This Encounter  Procedures  .  CBC with Differential/Platelet  . CMP14+EGFR  . Lipid panel    Caryl Pina, MD [facility] 01/16/2016, 12:04 PM

## 2016-01-18 ENCOUNTER — Encounter: Payer: Self-pay | Admitting: Family Medicine

## 2016-01-18 LAB — PAP IG, RFX HPV ALL PTH: PAP Smear Comment: 0

## 2016-02-14 ENCOUNTER — Encounter (HOSPITAL_COMMUNITY): Payer: Self-pay | Admitting: Emergency Medicine

## 2016-02-14 ENCOUNTER — Emergency Department (HOSPITAL_COMMUNITY)
Admission: EM | Admit: 2016-02-14 | Discharge: 2016-02-14 | Disposition: A | Discharge status: Home / Self Care | Payer: No Typology Code available for payment source | Attending: Emergency Medicine | Admitting: Emergency Medicine

## 2016-02-14 ENCOUNTER — Emergency Department (HOSPITAL_COMMUNITY): Payer: No Typology Code available for payment source

## 2016-02-14 DIAGNOSIS — Z79899 Other long term (current) drug therapy: Secondary | ICD-10-CM | POA: Diagnosis not present

## 2016-02-14 DIAGNOSIS — M549 Dorsalgia, unspecified: Secondary | ICD-10-CM | POA: Insufficient documentation

## 2016-02-14 DIAGNOSIS — F17210 Nicotine dependence, cigarettes, uncomplicated: Secondary | ICD-10-CM | POA: Diagnosis not present

## 2016-02-14 DIAGNOSIS — Y999 Unspecified external cause status: Secondary | ICD-10-CM | POA: Insufficient documentation

## 2016-02-14 DIAGNOSIS — Y92410 Unspecified street and highway as the place of occurrence of the external cause: Secondary | ICD-10-CM | POA: Diagnosis not present

## 2016-02-14 DIAGNOSIS — I10 Essential (primary) hypertension: Secondary | ICD-10-CM | POA: Insufficient documentation

## 2016-02-14 DIAGNOSIS — R079 Chest pain, unspecified: Secondary | ICD-10-CM | POA: Diagnosis not present

## 2016-02-14 DIAGNOSIS — S161XXA Strain of muscle, fascia and tendon at neck level, initial encounter: Secondary | ICD-10-CM | POA: Diagnosis not present

## 2016-02-14 DIAGNOSIS — Y939 Activity, unspecified: Secondary | ICD-10-CM | POA: Insufficient documentation

## 2016-02-14 DIAGNOSIS — S20211A Contusion of right front wall of thorax, initial encounter: Secondary | ICD-10-CM

## 2016-02-14 DIAGNOSIS — S299XXA Unspecified injury of thorax, initial encounter: Secondary | ICD-10-CM | POA: Diagnosis not present

## 2016-02-14 DIAGNOSIS — S3992XA Unspecified injury of lower back, initial encounter: Secondary | ICD-10-CM | POA: Diagnosis not present

## 2016-02-14 DIAGNOSIS — S199XXA Unspecified injury of neck, initial encounter: Secondary | ICD-10-CM | POA: Diagnosis present

## 2016-02-14 DIAGNOSIS — M545 Low back pain: Secondary | ICD-10-CM | POA: Diagnosis not present

## 2016-02-14 DIAGNOSIS — M542 Cervicalgia: Secondary | ICD-10-CM | POA: Diagnosis not present

## 2016-02-14 LAB — POC URINE PREG, ED: PREG TEST UR: NEGATIVE

## 2016-02-14 MED ORDER — HYDROCODONE-ACETAMINOPHEN 5-325 MG PO TABS: ORAL_TABLET | ORAL | 0 refills | Status: DC

## 2016-02-14 MED ORDER — CYCLOBENZAPRINE HCL 10 MG PO TABS: 10.0000 mg | ORAL_TABLET | Freq: Three times a day (TID) | ORAL | 0 refills | Status: DC | PRN

## 2016-02-14 MED ORDER — IBUPROFEN 600 MG PO TABS: 600.0000 mg | ORAL_TABLET | Freq: Four times a day (QID) | ORAL | 0 refills | Status: DC | PRN

## 2016-02-14 NOTE — Discharge Instructions (Signed)
Apply ice packs on/off to your neck and back.  Follow-up with your doctor or with Dr. Harrison;'s office in a few days if the pain is not improving

## 2016-02-14 NOTE — ED Notes (Signed)
pt taken to xray 
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
## 2016-02-16 ENCOUNTER — Encounter: Payer: Self-pay | Admitting: Family Medicine

## 2016-02-16 ENCOUNTER — Ambulatory Visit (INDEPENDENT_AMBULATORY_CARE_PROVIDER_SITE_OTHER): Payer: Medicare Other | Admitting: Family Medicine

## 2016-02-16 DIAGNOSIS — S161XXA Strain of muscle, fascia and tendon at neck level, initial encounter: Secondary | ICD-10-CM

## 2016-02-16 DIAGNOSIS — R0789 Other chest pain: Secondary | ICD-10-CM

## 2016-02-16 DIAGNOSIS — S161XXS Strain of muscle, fascia and tendon at neck level, sequela: Secondary | ICD-10-CM

## 2016-02-16 MED ORDER — PREDNISONE 20 MG PO TABS: ORAL_TABLET | ORAL | 0 refills | Status: DC
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
## 2016-03-01 ENCOUNTER — Encounter: Payer: Self-pay | Admitting: Family Medicine

## 2016-03-01 ENCOUNTER — Ambulatory Visit (INDEPENDENT_AMBULATORY_CARE_PROVIDER_SITE_OTHER): Payer: Medicare Other | Admitting: Family Medicine

## 2016-03-01 VITALS — BP 119/83 | HR 91 | Temp 98.6°F | Ht 66.0 in | Wt 188.4 lb

## 2016-03-01 DIAGNOSIS — N921 Excessive and frequent menstruation with irregular cycle: Secondary | ICD-10-CM

## 2016-03-01 DIAGNOSIS — F17200 Nicotine dependence, unspecified, uncomplicated: Secondary | ICD-10-CM

## 2016-03-01 DIAGNOSIS — S161XXS Strain of muscle, fascia and tendon at neck level, sequela: Secondary | ICD-10-CM

## 2016-03-01 MED ORDER — VARENICLINE TARTRATE 0.5 MG X 11 & 1 MG X 42 PO MISC: ORAL | 0 refills | Status: DC

## 2016-03-01 MED ORDER — ESTROGENS CONJUGATED 0.625 MG PO TABS: 0.6250 mg | ORAL_TABLET | Freq: Every day | ORAL | 0 refills | Status: DC

## 2016-03-01 MED ORDER — VARENICLINE TARTRATE 1 MG PO TABS: 1.0000 mg | ORAL_TABLET | Freq: Two times a day (BID) | ORAL | 1 refills | Status: DC
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
## 2016-03-05 ENCOUNTER — Encounter (HOSPITAL_COMMUNITY): Payer: Self-pay | Admitting: Emergency Medicine

## 2016-03-05 ENCOUNTER — Emergency Department (HOSPITAL_COMMUNITY): Payer: Medicare Other

## 2016-03-05 ENCOUNTER — Emergency Department (HOSPITAL_COMMUNITY)
Admission: EM | Admit: 2016-03-05 | Discharge: 2016-03-05 | Disposition: A | Discharge status: Home / Self Care | Payer: Medicare Other | Attending: Emergency Medicine | Admitting: Emergency Medicine

## 2016-03-05 DIAGNOSIS — N939 Abnormal uterine and vaginal bleeding, unspecified: Secondary | ICD-10-CM | POA: Diagnosis not present

## 2016-03-05 DIAGNOSIS — Z79899 Other long term (current) drug therapy: Secondary | ICD-10-CM | POA: Diagnosis not present

## 2016-03-05 DIAGNOSIS — J209 Acute bronchitis, unspecified: Secondary | ICD-10-CM

## 2016-03-05 DIAGNOSIS — I10 Essential (primary) hypertension: Secondary | ICD-10-CM | POA: Insufficient documentation

## 2016-03-05 DIAGNOSIS — F17210 Nicotine dependence, cigarettes, uncomplicated: Secondary | ICD-10-CM | POA: Diagnosis not present

## 2016-03-05 DIAGNOSIS — R05 Cough: Secondary | ICD-10-CM | POA: Diagnosis present

## 2016-03-05 LAB — CBC WITH DIFFERENTIAL/PLATELET
Basophils Absolute: 0 K/uL (ref 0.0–0.1)
Basophils Relative: 0 %
Eosinophils Absolute: 0.1 K/uL (ref 0.0–0.7)
Eosinophils Relative: 1 %
HCT: 39.3 % (ref 36.0–46.0)
Hemoglobin: 13.2 g/dL (ref 12.0–15.0)
Lymphocytes Relative: 5 %
Lymphs Abs: 0.4 K/uL — ABNORMAL LOW (ref 0.7–4.0)
MCH: 31.7 pg (ref 26.0–34.0)
MCHC: 33.6 g/dL (ref 30.0–36.0)
MCV: 94.5 fL (ref 78.0–100.0)
Monocytes Absolute: 0.6 K/uL (ref 0.1–1.0)
Monocytes Relative: 8 %
Neutro Abs: 6.6 K/uL (ref 1.7–7.7)
Neutrophils Relative %: 86 %
Platelets: 248 K/uL (ref 150–400)
RBC: 4.16 MIL/uL (ref 3.87–5.11)
RDW: 13.5 % (ref 11.5–15.5)
WBC: 7.7 K/uL (ref 4.0–10.5)

## 2016-03-05 LAB — BASIC METABOLIC PANEL
Anion gap: 7 (ref 5–15)
BUN: 8 mg/dL (ref 6–20)
CO2: 24 mmol/L (ref 22–32)
Calcium: 9.5 mg/dL (ref 8.9–10.3)
Chloride: 105 mmol/L (ref 101–111)
Creatinine, Ser: 0.89 mg/dL (ref 0.44–1.00)
GFR calc Af Amer: >60 mL/min (ref >60)
GFR calc non Af Amer: >60 mL/min (ref >60)
Glucose, Bld: 102 mg/dL — ABNORMAL HIGH (ref 65–99)
Potassium: 3.5 mmol/L (ref 3.5–5.1)
Sodium: 136 mmol/L (ref 135–145)

## 2016-03-05 MED ORDER — PREDNISONE 20 MG PO TABS: 40.0000 mg | ORAL_TABLET | Freq: Every day | ORAL | 0 refills | Status: DC

## 2016-03-05 MED ORDER — IBUPROFEN 800 MG PO TABS
800.0000 mg | ORAL_TABLET | Freq: Once | ORAL | Status: AC
  Administered 2016-03-05: 800 mg via ORAL
  Filled 2016-03-05: qty 1

## 2016-03-05 MED ORDER — MEDROXYPROGESTERONE ACETATE 10 MG PO TABS: 10.0000 mg | ORAL_TABLET | Freq: Every day | ORAL | 0 refills | Status: DC

## 2016-03-05 MED ORDER — PREDNISONE 20 MG PO TABS
40.0000 mg | ORAL_TABLET | Freq: Once | ORAL | Status: AC
  Administered 2016-03-05: 40 mg via ORAL
  Filled 2016-03-05: qty 2

## 2016-03-05 MED ORDER — PREDNISONE 20 MG PO TABS
ORAL_TABLET | ORAL | Status: AC
  Filled 2016-03-05: qty 1

## 2016-03-05 NOTE — ED Triage Notes (Signed)
Pt states she is coughing up green stuff beginning yesterday and has been having vaginal bleeding since November 13th.  States it is very heavy.

## 2016-03-07 ENCOUNTER — Ambulatory Visit: Payer: No Typology Code available for payment source | Attending: Family Medicine | Admitting: Physical Therapy

## 2016-03-07 DIAGNOSIS — M542 Cervicalgia: Secondary | ICD-10-CM | POA: Insufficient documentation

## 2016-03-07 DIAGNOSIS — R293 Abnormal posture: Secondary | ICD-10-CM
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
## 2016-03-08 ENCOUNTER — Other Ambulatory Visit: Payer: Self-pay | Admitting: Family Medicine

## 2016-03-08 MED ORDER — ESTRADIOL 0.5 MG PO TABS: 0.5000 mg | ORAL_TABLET | Freq: Every day | ORAL | 0 refills | Status: DC

## 2016-03-08 NOTE — Progress Notes (Signed)
Left message for patient to call.

## 2016-03-12 ENCOUNTER — Encounter: Payer: Self-pay | Admitting: Orthopedic Surgery

## 2016-03-12 ENCOUNTER — Encounter: Payer: Self-pay | Admitting: Physical Therapy

## 2016-03-12 ENCOUNTER — Ambulatory Visit: Payer: No Typology Code available for payment source | Admitting: Physical Therapy

## 2016-03-12 ENCOUNTER — Ambulatory Visit (INDEPENDENT_AMBULATORY_CARE_PROVIDER_SITE_OTHER): Payer: Medicare Other | Admitting: Orthopedic Surgery

## 2016-03-12 DIAGNOSIS — M542 Cervicalgia: Secondary | ICD-10-CM | POA: Diagnosis not present

## 2016-03-12 DIAGNOSIS — M545 Low back pain, unspecified: Secondary | ICD-10-CM

## 2016-03-12 DIAGNOSIS — R293 Abnormal posture: Secondary | ICD-10-CM
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
## 2016-03-12 NOTE — Patient Instructions (Addendum)
Continue therapy   Fu with DR Warrick Parisian FOR BACK   ARRANGE APPT FOR CARPAL TUNNEL SYNDROME

## 2016-03-12 NOTE — Therapy (Signed)
[facility] [place], [place], [address] Phone: [phone]   Fax:  [phone]  Physical Therapy Treatment  Patient Details  Name: CHRISTIONNA HLINKA MRN: [id number] Date of Birth: 24-Oct-1975 Referring Provider: Vonna Kotyk Dettinger MD.  Encounter Date: 03/12/2016      PT End of Session - 03/12/16 1247    Visit Number 2   Number of Visits 12   Date for PT Re-Evaluation 04/18/16   PT Start Time 1229   PT Stop Time [id number]   PT Time Calculation (min) 48 min   Activity Tolerance Patient tolerated treatment well   Behavior During Therapy [facility] for tasks assessed/performed      Past Medical History:  Diagnosis Date  . Anxiety   . Bipolar 1 disorder ([place])   . Migraines   . Schizophrenia [facility])     Past Surgical History:  Procedure Laterality Date  . arm surgery     tendonitis  . TUBAL LIGATION      There were no vitals filed for this visit.      Subjective Assessment - 03/12/16 1233    Subjective Patient reported ongoing pain today   Patient Stated Goals Get out of pain.   Currently in Pain? Yes   Pain Score 10-Worst pain ever   Pain Location Neck   Pain Orientation Right   Pain Descriptors / Indicators Aching   Pain Type Acute pain   Pain Onset 1 to 4 weeks ago   Aggravating Factors  movement   Pain Relieving Factors at rest                         [facility] Adult PT Treatment/Exercise - 03/12/16 0001      Modalities   Modalities Electrical Stimulation;Moist Heat;Ultrasound     Moist Heat Therapy   Number Minutes Moist Heat 15 Minutes   Moist Heat Location Cervical     Electrical Stimulation   Electrical Stimulation Location right cervical   Electrical Stimulation Action premod 80-150 x70min   Electrical Stimulation Goals Pain     Ultrasound   Ultrasound Location right cervical paraspinals/ut   Ultrasound Parameters 1.5w/cm/50%/17mhz x10 min   Ultrasound Goals Pain     Manual Therapy    Manual Therapy Soft tissue mobilization   Soft tissue mobilization manual STW to cervical paraspinals and UT                PT Education - 03/12/16 1307    Education provided Yes   Education Details HEP   Person(s) Educated Patient;Spouse   Methods Explanation;Demonstration;Handout   Comprehension Verbalized understanding;Returned demonstration             PT Long Term Goals - 03/12/16 1247      PT LONG TERM GOAL #1   Title Independent with a HEP.   Time 6   Period Weeks   Status On-going     PT LONG TERM GOAL #2   Title Increase active cervical rotation to 75 degrees+ so patient can turn head more easily while driving.   Time 6   Period Weeks   Status On-going     PT LONG TERM GOAL #3   Title Eliminate headaches.   Time 6   Period Weeks   Status On-going               Plan - 03/12/16 1307    Clinical Impression Statement Patient tolerated treatment well today and felt better  after treatment. Patient had moderate plus palpable pain in right cervical area and UT area. Patient has more pain due to not able to take pain medication. Patient was given HEP today and independent with. Patient current goals ongoing due to pain deficts.   Rehab Potential Good   PT Frequency 2x / week   PT Duration 6 weeks   PT Treatment/Interventions ADLs/Self Care Home Management;Cryotherapy;Electrical Stimulation;Moist Heat;Traction;Ultrasound;Patient/family education;Therapeutic exercise;Therapeutic activities;Manual techniques;Passive range of motion;Dry needling   PT Next Visit Plan cont with POC for Modalites and STW/M to right cervical musculature; AROM to include chin tucks and extension.   Consulted and Agree with Plan of Care Patient      Patient will benefit from skilled therapeutic intervention in order to improve the following deficits and impairments:  Pain, Decreased activity tolerance, Decreased range of motion, Postural dysfunction  Visit  Diagnosis: Cervicalgia  Abnormal posture     Problem List Patient Active Problem List   Diagnosis Date Noted  . Essential hypertension, benign 01/16/2016  . Migraine headache 04/18/2015  . Tobacco abuse 02/02/2015  . Constipation 02/02/2015  . Gastroesophageal reflux disease without esophagitis 08/24/2014  . Hyperlipidemia LDL goal <100 09/14/2013  . Bipolar disorder ([place]) 08/25/2013    Delando Satter P, PTA 03/12/2016, 1:18 PM  [facility] [place], [place], [address] Phone: [phone]   Fax:  [phone]  Name: MACHALA LOCKWOOD MRN: [id number] Date of Birth: [birth year]

## 2016-03-12 NOTE — Patient Instructions (Signed)
AROM: Neck Rotation   Turn head slowly to look over one shoulder, then the other. Hold each position _10___ seconds. Repeat _5___ times per set. Do __2__ sets per session. Do _2-3___ sessions per day.  http://orth.exer.us/294   Copyright  VHI. All rights reserved.  AROM: Lateral Neck Flexion   Slowly tilt head toward one shoulder, then the other. Hold each position _10___ seconds. Repeat __5__ times per set. Do __2__ sets per session. Do __2-3__ sessions per day.  http://orth.exer.us/296   Copyright  VHI. All rights reserved.  Stretch Break - Chin Tuck   Looking straight forward, tuck chin and hold __10__ seconds. Relax and return to starting position. Repeat __5-10__ times every _3-4___ hours.  Copyright  VHI. All rights reserved.  Stretch Break - Chest and Shoulder Stretch   Maintaining erect posture, draw shoulders back while bringing elbows back and inward. Return to starting position. Repeat __10-20__ times every _3-4___ hours.  Copyright  VHI. All rights reserved.    
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
## 2016-03-14 ENCOUNTER — Encounter: Payer: Medicare Other | Admitting: Physical Therapy

## 2016-03-20 ENCOUNTER — Ambulatory Visit: Payer: Medicare Other | Admitting: Orthopedic Surgery

## 2016-03-20 ENCOUNTER — Other Ambulatory Visit: Payer: Self-pay | Admitting: Family Medicine

## 2016-03-20 DIAGNOSIS — G43009 Migraine without aura, not intractable, without status migrainosus: Secondary | ICD-10-CM

## 2016-03-22 ENCOUNTER — Encounter: Payer: Medicare Other | Admitting: Physical Therapy

## 2016-03-27 ENCOUNTER — Encounter: Payer: Self-pay | Admitting: Orthopedic Surgery

## 2016-03-27 ENCOUNTER — Ambulatory Visit (INDEPENDENT_AMBULATORY_CARE_PROVIDER_SITE_OTHER): Payer: Medicare Other | Admitting: Orthopedic Surgery

## 2016-03-27 ENCOUNTER — Telehealth: Payer: Self-pay | Admitting: Orthopedic Surgery

## 2016-03-27 VITALS — BP 136/78 | HR 91 | Wt 182.0 lb

## 2016-03-27 DIAGNOSIS — G5602 Carpal tunnel syndrome, left upper limb: Secondary | ICD-10-CM | POA: Diagnosis not present

## 2016-03-27 NOTE — Telephone Encounter (Signed)
What would the referral be for and do we have documentation to support this?

## 2016-03-27 NOTE — Telephone Encounter (Signed)
SHE WANTS TO BE SEEN FOR BACK PAIN

## 2016-03-27 NOTE — Progress Notes (Signed)
Patient ID: Stacy Wolf, female   DOB: [birth year], 42 y.o.   MRN: [id number]  Chief Complaint  Patient presents with  . Carpal Tunnel    bilateral carpal tunnel    HPI Stacy Wolf is a 41 y.o. female.  41 year old female with bilateral carpal tunnel syndrome prior treatment included bilateral wrist splints and carpal tunnel injections every 6 months for 2 years. The patient was advised to have surgery but didn't want to have the surgery because she had a difficult time with a de Quervain's release on the right  She has bilateral symptoms of pain paresthesias night pain numbness and tingling in the thumb index long and ring fingers on both hands  Review of Systems Review of Systems  Constitutional: Negative for fever.  Respiratory: Negative for shortness of breath.   Cardiovascular: Negative for chest pain.  Musculoskeletal: Positive for back pain.  Neurological: Positive for numbness. Negative for weakness.    Past Medical History:  Diagnosis Date  . Anxiety   . Bipolar 1 disorder ([place])   . Migraines   . Schizophrenia [facility])     Past Surgical History:  Procedure Laterality Date  . arm surgery     tendonitis  . TUBAL LIGATION      Family History  Problem Relation Age of Onset  . Cancer Mother 54    breast CA at 64 and uterine CA at 51  . Hypertension Mother   . Kidney disease Mother     kidney transplant  . Cancer Sister 61    uterine  . Cancer Sister 67    uterine  . Diabetes Father   . Cancer Maternal Aunt     breast    Social History Social History  Substance Use Topics  . Smoking status: Current Every Day Smoker    Packs/day: 0.25    Types: Cigarettes  . Smokeless tobacco: Never Used  . Alcohol use No    Allergies  Allergen Reactions  . Wellbutrin [Bupropion] Other (See Comments)    Seizures / lowered seizure threshold  . Zoloft [Sertraline Hcl] Hives  . Haloperidol And Related     Leg cramps  . Imitrex [Sumatriptan]     Could not swallow  after taking med  . Latuda [Lurasidone Hcl]     Suicidal thoughts   . Nicoderm [Nicotine] Dermatitis    Current Outpatient Prescriptions  Medication Sig Dispense Refill  . cyclobenzaprine (FLEXERIL) 10 MG tablet Take 1 tablet (10 mg total) by mouth 3 (three) times daily as needed. 21 tablet 0  . estradiol (ESTRACE) 0.5 MG tablet Take 1 tablet (0.5 mg total) by mouth daily. 5 tablet 0  . estrogens, conjugated, (PREMARIN) 0.625 MG tablet Take 1 tablet (0.625 mg total) by mouth daily. Take daily for 21 days then do not take for 7 days. 5 tablet 0  . ibuprofen (ADVIL,MOTRIN) 600 MG tablet Take 1 tablet (600 mg total) by mouth every 6 (six) hours as needed. 30 tablet 0  . medroxyPROGESTERone (DEPO-PROVERA) 150 MG/ML injection Inject 1 mL (150 mg total) into the muscle every 3 (three) months. 1 mL 3  . medroxyPROGESTERone (PROVERA) 10 MG tablet Take 1 tablet (10 mg total) by mouth daily. 5 tablet 0  . predniSONE (DELTASONE) 20 MG tablet Take 2 tablets (40 mg total) by mouth daily. 10 tablet 0  . propranolol ER (INDERAL LA) 80 MG 24 hr capsule TAKE 1 CAPSULE (80 MG TOTAL) BY MOUTH DAILY. 30 capsule 4  . varenicline (  CHANTIX CONTINUING MONTH PAK) 1 MG tablet Take 1 tablet (1 mg total) by mouth 2 (two) times daily. 60 tablet 1  . varenicline (CHANTIX STARTING MONTH PAK) 0.5 MG X 11 & 1 MG X 42 tablet Take one 0.5 mg tablet by mouth daily for 3 days, then one 0.5 mg tablet twice daily for 4 days, then one 1 mg tablet twice daily. 53 tablet 0   No current facility-administered medications for this visit.      Physical Exam There were no vitals taken for this visit. Physical Exam The patient is well developed well nourished and well groomed.  Orientation to person place and time is normal  Mood is pleasant.  Ambulatory status IS NORMAL   BILATERAL Upper extremity examination reveals the following:  Inspection reveals no swelling. There is tenderness over the carpal tunnel.  Range of motion  of the wrist and elbow are normal  Motor exam shows mild weakness with grip strength.  Wrist joint is stable  Provocative tests for carpal tunnel Phalen's test  POSITIVE AT 5 SECONDS LEFT AND 10 SEC RIGHT  Carpal tunnel compression test POSITIVE IN BOTH   Pulses are normal in the radial and ulnar artery with a normal Mccleese's test.  Decreased sensation is noted in the median nerve distribution. Soft touch is normal.  Opposite extremity  Data Reviewed  independent image interpretation :  NO IMAGING   Assessment    BILATERAL  CTS    Plan    SUR[facility] This procedure has been fully reviewed with the patient and written informed consent has been obtained.\

## 2016-03-27 NOTE — Patient Instructions (Addendum)
Carpal Tunnel Syndrome Introduction Carpal tunnel syndrome is a condition that causes pain in your hand and arm. The carpal tunnel is a narrow area that is on the palm side of your wrist. Repeated wrist motion or certain diseases may cause swelling in the tunnel. This swelling can pinch the main nerve in the wrist (median nerve). Follow these instructions at home: If you have a splint:  Wear it as told by your doctor. Remove it only as told by your doctor.  Loosen the splint if your fingers:  Become numb and tingle.  Turn blue and cold.  Keep the splint clean and dry. General instructions  Take over-the-counter and prescription medicines only as told by your doctor.  Rest your wrist from any activity that may be causing your pain. If needed, talk to your employer about changes that can be made in your work, such as getting a wrist pad to use while typing.  If directed, apply ice to the painful area:  Put ice in a plastic bag.  Place a towel between your skin and the bag.  Leave the ice on for 20 minutes, 2-3 times per day.  Keep all follow-up visits as told by your doctor. This is important.  Do any exercises as told by your doctor, physical therapist, or occupational therapist. Contact a doctor if:  You have new symptoms.  Medicine does not help your pain.  Your symptoms get worse. This information is not intended to replace advice given to you by your health care provider. Make sure you discuss any questions you have with your health care provider. Document Released: 02/08/2011 Document Revised: 07/28/2015 Document Reviewed: 07/07/2014  2017 Elsevier  Carpal Tunnel Release Carpal tunnel release is a surgical procedure to relieve numbness and pain in your hand that are caused by carpal tunnel syndrome. Your carpal tunnel is a narrow, hollow space in your wrist. It passes between your wrist bones and a band of connective tissue (transverse carpal ligament). The nerve that  supplies most of your hand (median nerve) passes through this space, and so do the connections between your fingers and the muscles of your arm (tendons). Carpal tunnel syndrome makes this space swell and become narrow, and this causes pain and numbness. In carpal tunnel release surgery, a surgeon cuts through the transverse carpal ligament to make more room in the carpal tunnel space. You may have this surgery if other types of treatment have not worked. Tell a health care provider about:  Any allergies you have.  All medicines you are taking, including vitamins, herbs, eye drops, creams, and over-the-counter medicines.  Any problems you or family members have had with anesthetic medicines.  Any blood disorders you have.  Any surgeries you have had.  Any medical conditions you have. What are the risks? Generally, this is a safe procedure. However, problems may occur, including:  Bleeding.  Infection.  Injury to the median nerve.  Need for additional surgery. What happens before the procedure?  Ask your health care provider about:  Changing or stopping your regular medicines. This is especially important if you are taking diabetes medicines or blood thinners.  Taking medicines such as aspirin and ibuprofen. These medicines can thin your blood. Do not take these medicines before your procedure if your health care provider instructs you not to.  Do not eat or drink anything after midnight on the night before the procedure or as directed by your health care provider.  Plan to have someone take you home  after the procedure. What happens during the procedure?  An IV tube may be inserted into a vein.  You will be given one of the following:  A medicine that numbs the wrist area (local anesthetic). You may also be given a medicine to make you relax (sedative).  A medicine that makes you go to sleep (general anesthetic).  Your arm, hand, and wrist will be cleaned with a  germ-killing solution (antiseptic).  Your surgeon will make a surgical cut (incision) over the palm side of your wrist. The surgeon will pull aside the skin of your wrist to expose the carpal tunnel space.  The surgeon will cut the transverse carpal ligament.  The edges of the incision will be closed with stitches (sutures) or staples.  A bandage (dressing) will be placed over your wrist and wrapped around your hand and wrist. What happens after the procedure?  You may spend some time in a recovery area.  Your blood pressure, heart rate, breathing rate, and blood oxygen level will be monitored often until the medicines you were given have worn off.  You will likely have some pain. You will be given pain medicine.  You may need to wear a splint or a wrist brace over your dressing. This information is not intended to replace advice given to you by your health care provider. Make sure you discuss any questions you have with your health care provider. Document Released: 05/12/2003 Document Revised: 07/28/2015 Document Reviewed: 10/07/2013 Elsevier Interactive Patient Education  2017 [employer].

## 2016-03-27 NOTE — Telephone Encounter (Signed)
Patient stopped back by office - states spine specialist office (ph# [phone]) advised referral from our office needed.  Please advise patient - [phone]

## 2016-03-29 ENCOUNTER — Other Ambulatory Visit: Payer: Self-pay | Admitting: *Deleted

## 2016-03-29 NOTE — Telephone Encounter (Signed)
REQUEST SUBMITTED VIA THEIR ONLINE PORTAL  Thank you for your submission. Submission ID: [id number] Date Submitted: Mar 29, 2016 11:48 AM IP Address: [phone]

## 2016-03-30 ENCOUNTER — Telehealth: Payer: Self-pay | Admitting: Orthopedic Surgery

## 2016-03-30 NOTE — Telephone Encounter (Signed)
Martiza from [facility] called asking for office notes, demographic and insurance information on this patient for the referral you submitted online.  Please advise  [phone] ext# [phone]  Fax # [phone]

## 2016-04-02 NOTE — Telephone Encounter (Signed)
INFORMATION FAXED.

## 2016-04-05 NOTE — Patient Instructions (Signed)
Stacy Wolf  04/05/2016     @PREFPERIOPPHARMACY @   Your procedure is scheduled on  04/11/2016   Report to [facility] at  31  A.M.  Call this number if you have problems the morning of surgery:  [phone]   Remember:  Do not eat food or drink liquids after midnight.  Take these medicines the morning of surgery with A SIP OF WATER  Flexaril, provera, deltazone,inderal.   Do not wear jewelry, make-up or nail polish.  Do not wear lotions, powders, or perfumes, or deoderant.  Do not shave 48 hours prior to surgery.  Men may shave face and neck.  Do not bring valuables to the hospital.  [facility] is not responsible for any belongings or valuables.  Contacts, dentures or bridgework may not be worn into surgery.  Leave your suitcase in the car.  After surgery it may be brought to your room.  For patients admitted to the hospital, discharge time will be determined by your treatment team.  Patients discharged the day of surgery will not be allowed to drive home.   Name and phone number of your driver:   family Special instructions:  None  Please read over the following fact sheets that you were given. Anesthesia Post-op Instructions and Care and Recovery After Surgery       Carpal Tunnel Release Carpal tunnel release is a surgical procedure to relieve numbness and pain in your hand that are caused by carpal tunnel syndrome. Your carpal tunnel is a narrow, hollow space in your wrist. It passes between your wrist bones and a band of connective tissue (transverse carpal ligament). The nerve that supplies most of your hand (median nerve) passes through this space, and so do the connections between your fingers and the muscles of your arm (tendons). Carpal tunnel syndrome makes this space swell and become narrow, and this causes pain and numbness. In carpal tunnel release surgery, a surgeon cuts through the transverse carpal ligament to make more room in the carpal tunnel  space. You may have this surgery if other types of treatment have not worked. Tell a health care provider about:  Any allergies you have.  All medicines you are taking, including vitamins, herbs, eye drops, creams, and over-the-counter medicines.  Any problems you or family members have had with anesthetic medicines.  Any blood disorders you have.  Any surgeries you have had.  Any medical conditions you have. What are the risks? Generally, this is a safe procedure. However, problems may occur, including:  Bleeding.  Infection.  Injury to the median nerve.  Need for additional surgery. What happens before the procedure?  Ask your health care provider about:  Changing or stopping your regular medicines. This is especially important if you are taking diabetes medicines or blood thinners.  Taking medicines such as aspirin and ibuprofen. These medicines can thin your blood. Do not take these medicines before your procedure if your health care provider instructs you not to.  Do not eat or drink anything after midnight on the night before the procedure or as directed by your health care provider.  Plan to have someone take you home after the procedure. What happens during the procedure?  An IV tube may be inserted into a vein.  You will be given one of the following:  A medicine that numbs the wrist area (local anesthetic). You may also be given a medicine to make you relax (sedative).  A medicine that makes you go to sleep (general anesthetic).  Your arm, hand, and wrist will be cleaned with a germ-killing solution (antiseptic).  Your surgeon will make a surgical cut (incision) over the palm side of your wrist. The surgeon will pull aside the skin of your wrist to expose the carpal tunnel space.  The surgeon will cut the transverse carpal ligament.  The edges of the incision will be closed with stitches (sutures) or staples.  A bandage (dressing) will be placed over  your wrist and wrapped around your hand and wrist. What happens after the procedure?  You may spend some time in a recovery area.  Your blood pressure, heart rate, breathing rate, and blood oxygen level will be monitored often until the medicines you were given have worn off.  You will likely have some pain. You will be given pain medicine.  You may need to wear a splint or a wrist brace over your dressing. This information is not intended to replace advice given to you by your health care provider. Make sure you discuss any questions you have with your health care provider. Document Released: 05/12/2003 Document Revised: 07/28/2015 Document Reviewed: 10/07/2013 Elsevier Interactive Patient Education  2017 [place] After Refer to this sheet in the next few weeks. These instructions provide you with information about caring for yourself after your procedure. Your health care provider may also give you more specific instructions. Your treatment has been planned according to current medical practices, but problems sometimes occur. Call your health care provider if you have any problems or questions after your procedure. What can I expect after the procedure? After your procedure, it is typical to have the following:  Pain.  Numbness.  Tingling.  Swelling.  Stiffness.  Bruising. Follow these instructions at home:  Take medicines only as directed by your health care provider.  There are many different ways to close and cover an incision, including stitches (sutures), skin glue, and adhesive strips. Follow your health care provider's instructions about:  Incision care.  Bandage (dressing) changes and removal.  Incision closure removal.  Wear a splint or a brace as directed by your surgeon. You may need to do this for 2-3 weeks.  Keep your hand raised (elevated) above the level of your heart while you are resting. Move your fingers often.  Avoid  activities that cause hand pain.  Ask your surgeon when you can start to do all of your usual activities again, such as:  Driving.  Returning to work.  Bathing and swimming.  Keep all follow-up visits as directed by your health care provider. This is important. You may need physical therapy for several months to speed healing and regain movement. Contact a health care provider if:  You have drainage, redness, swelling, or pain at your incision site.  You have a fever.  You have chills.  Your pain medicine is not working.  Your symptoms do not go away after 2 months.  Your symptoms go away and then return. Get help right away if:  You have pain or numbness that is getting worse.  Your fingers change color.  You are not able to move your fingers. This information is not intended to replace advice given to you by your health care provider. Make sure you discuss any questions you have with your health care provider. Document Released: 09/08/2004 Document Revised: 07/28/2015 Document Reviewed: 10/07/2013 Elsevier Interactive Patient Education  2017 [place] INSTRUCTIONS POST-ANESTHESIA  IMMEDIATELY FOLLOWING SURGERY:  Do not drive or operate machinery for the first twenty four hours after surgery.  Do not make any important decisions for twenty four hours after surgery or while taking narcotic pain medications or sedatives.  If you develop intractable nausea and vomiting or a severe headache please notify your doctor immediately.  FOLLOW-UP:  Please make an appointment with your surgeon as instructed. You do not need to follow up with anesthesia unless specifically instructed to do so.  WOUND CARE INSTRUCTIONS (if applicable):  Keep a dry clean dressing on the anesthesia/puncture wound site if there is drainage.  Once the wound has quit draining you may leave it open to air.  Generally you should leave the bandage intact for twenty four hours unless there is  drainage.  If the epidural site drains for more than 36-48 hours please call the anesthesia department.  QUESTIONS?:  Please feel free to call your physician or the hospital operator if you have any questions, and they will be happy to assist you.

## 2016-04-06 ENCOUNTER — Other Ambulatory Visit: Payer: Self-pay

## 2016-04-06 ENCOUNTER — Encounter (HOSPITAL_COMMUNITY): Payer: Self-pay

## 2016-04-06 ENCOUNTER — Encounter (HOSPITAL_COMMUNITY)
Admission: RE | Admit: 2016-04-06 | Discharge: 2016-04-06 | Disposition: A | Discharge status: Home / Self Care | Payer: Medicare Other | Source: Ambulatory Visit | Attending: Orthopedic Surgery | Admitting: Orthopedic Surgery

## 2016-04-06 DIAGNOSIS — Z01818 Encounter for other preprocedural examination: Secondary | ICD-10-CM | POA: Insufficient documentation

## 2016-04-06 HISTORY — DX: Cardiac murmur, unspecified: R01.1

## 2016-04-06 LAB — HCG, SERUM, QUALITATIVE: PREG SERUM: NEGATIVE

## 2016-04-10 NOTE — H&P (Signed)
Stacy Civil, MD  Orthopedics   HISTORY AND PHYSICAL FOR SURGERY   Hide copied text Hover for attribution information Patient ID: [id number] Stacy Wolf, female   DOB: 1975-08-31, 41 y.o.   MRN: [id number]       Chief Complaint  Patient presents with  . Carpal Tunnel      bilateral carpal tunnel      HPI Stacy Wolf is a 41 y.o. female.  41 year old female with bilateral carpal tunnel syndrome prior treatment included bilateral wrist splints and carpal tunnel injections every 6 months for 2 years. The patient was advised to have surgery but didn't want to have the surgery because she had a difficult time with a de Quervain's release on the right   She has bilateral symptoms of pain paresthesias night pain numbness and tingling in the thumb index long and ring fingers on both hands   Review of Systems Review of Systems  Constitutional: Negative for fever.  Respiratory: Negative for shortness of breath.   Cardiovascular: Negative for chest pain.  Musculoskeletal: Positive for back pain.  Neurological: Positive for numbness. Negative for weakness.          Past Medical History:  Diagnosis Date  . Anxiety    . Bipolar 1 disorder ([place])    . Migraines    . Schizophrenia [facility])             Past Surgical History:  Procedure Laterality Date  . arm surgery        tendonitis  . TUBAL LIGATION                Family History  Problem Relation Age of Onset  . Cancer Mother 110      breast CA at 26 and uterine CA at 69  . Hypertension Mother    . Kidney disease Mother        kidney transplant  . Cancer Sister 51      uterine  . Cancer Sister 42      uterine  . Diabetes Father    . Cancer Maternal Aunt        breast      Social History      Social History  Substance Use Topics  . Smoking status: Current Every Day Smoker      Packs/day: 0.25      Types: Cigarettes  . Smokeless tobacco: Never Used  . Alcohol use No           Allergies  Allergen Reactions  .  Wellbutrin [Bupropion] Other (See Comments)      Seizures / lowered seizure threshold  . Zoloft [Sertraline Hcl] Hives  . Haloperidol And Related        Leg cramps  . Imitrex [Sumatriptan]        Could not swallow after taking med  . Latuda [Lurasidone Hcl]        Suicidal thoughts    . Nicoderm [Nicotine] Dermatitis            Current Outpatient Prescriptions  Medication Sig Dispense Refill  . cyclobenzaprine (FLEXERIL) 10 MG tablet Take 1 tablet (10 mg total) by mouth 3 (three) times daily as needed. 21 tablet 0  . estradiol (ESTRACE) 0.5 MG tablet Take 1 tablet (0.5 mg total) by mouth daily. 5 tablet 0  . estrogens, conjugated, (PREMARIN) 0.625 MG tablet Take 1 tablet (0.625 mg total) by mouth daily. Take daily for 21 days then do not take for 7  days. 5 tablet 0  . ibuprofen (ADVIL,MOTRIN) 600 MG tablet Take 1 tablet (600 mg total) by mouth every 6 (six) hours as needed. 30 tablet 0  . medroxyPROGESTERone (DEPO-PROVERA) 150 MG/ML injection Inject 1 mL (150 mg total) into the muscle every 3 (three) months. 1 mL 3  . medroxyPROGESTERone (PROVERA) 10 MG tablet Take 1 tablet (10 mg total) by mouth daily. 5 tablet 0  . predniSONE (DELTASONE) 20 MG tablet Take 2 tablets (40 mg total) by mouth daily. 10 tablet 0  . propranolol ER (INDERAL LA) 80 MG 24 hr capsule TAKE 1 CAPSULE (80 MG TOTAL) BY MOUTH DAILY. 30 capsule 4  . varenicline (CHANTIX CONTINUING MONTH PAK) 1 MG tablet Take 1 tablet (1 mg total) by mouth 2 (two) times daily. 60 tablet 1  . varenicline (CHANTIX STARTING MONTH PAK) 0.5 MG X 11 & 1 MG X 42 tablet Take one 0.5 mg tablet by mouth daily for 3 days, then one 0.5 mg tablet twice daily for 4 days, then one 1 mg tablet twice daily. 53 tablet 0    No current facility-administered medications for this visit.         Physical Exam There were no vitals taken for this visit. Physical Exam The patient is well developed well nourished and well groomed.  Orientation to person  place and time is normal  Mood is pleasant.   Ambulatory status IS NORMAL    BILATERAL Upper extremity examination reveals the following:   Inspection reveals no swelling. There is tenderness over the carpal tunnel.   Range of motion of the wrist and elbow are normal   Motor exam shows mild weakness with grip strength.   Wrist joint is stable   Provocative tests for carpal tunnel Phalen's test  POSITIVE AT 5 SECONDS LEFT AND 10 SEC RIGHT  Carpal tunnel compression test POSITIVE IN BOTH    Pulses are normal in the radial and ulnar artery with a normal Muscarella's test.   Decreased sensation is noted in the median nerve distribution. Soft touch is normal.   Opposite extremity   Data Reviewed  independent image interpretation :   NO IMAGING    Assessment    BILATERAL  CTS     Plan    Planned surgery left carpal tunnel release  This procedure has been fully reviewed with the patient and Informed consent was given

## 2016-04-11 ENCOUNTER — Ambulatory Visit (HOSPITAL_COMMUNITY): Payer: Medicare Other | Admitting: Anesthesiology

## 2016-04-11 ENCOUNTER — Encounter (HOSPITAL_COMMUNITY)
Admission: RE | Disposition: A | Discharge status: Home / Self Care | Payer: Self-pay | Source: Ambulatory Visit | Attending: Orthopedic Surgery

## 2016-04-11 ENCOUNTER — Ambulatory Visit (HOSPITAL_COMMUNITY)
Admission: RE | Admit: 2016-04-11 | Discharge: 2016-04-11 | Disposition: A | Discharge status: Home / Self Care | Payer: Medicare Other | Source: Ambulatory Visit | Attending: Orthopedic Surgery | Admitting: Orthopedic Surgery

## 2016-04-11 DIAGNOSIS — F17210 Nicotine dependence, cigarettes, uncomplicated: Secondary | ICD-10-CM | POA: Insufficient documentation

## 2016-04-11 DIAGNOSIS — I10 Essential (primary) hypertension: Secondary | ICD-10-CM | POA: Diagnosis not present

## 2016-04-11 DIAGNOSIS — G5602 Carpal tunnel syndrome, left upper limb: Secondary | ICD-10-CM

## 2016-04-11 DIAGNOSIS — Z79890 Hormone replacement therapy (postmenopausal): Secondary | ICD-10-CM | POA: Diagnosis not present

## 2016-04-11 DIAGNOSIS — Z79899 Other long term (current) drug therapy: Secondary | ICD-10-CM | POA: Insufficient documentation

## 2016-04-11 DIAGNOSIS — G5603 Carpal tunnel syndrome, bilateral upper limbs: Secondary | ICD-10-CM | POA: Insufficient documentation

## 2016-04-11 DIAGNOSIS — Z7952 Long term (current) use of systemic steroids: Secondary | ICD-10-CM | POA: Diagnosis not present

## 2016-04-11 HISTORY — PX: CARPAL TUNNEL RELEASE: SHX101

## 2016-04-11 SURGERY — CARPAL TUNNEL RELEASE
Anesthesia: Regional | Site: Hand | Laterality: Left

## 2016-04-11 MED ORDER — BUPIVACAINE IN DEXTROSE 0.75-8.25 % IT SOLN
INTRATHECAL | Status: AC
  Filled 2016-04-11: qty 2

## 2016-04-11 MED ORDER — HYDROCODONE-ACETAMINOPHEN 7.5-325 MG PO TABS: 1.0000 | ORAL_TABLET | ORAL | 0 refills | Status: DC | PRN

## 2016-04-11 MED ORDER — FENTANYL CITRATE (PF) 100 MCG/2ML IJ SOLN: 25.0000 ug | INTRAMUSCULAR | Status: DC | PRN

## 2016-04-11 MED ORDER — MIDAZOLAM HCL 2 MG/2ML IJ SOLN
INTRAMUSCULAR | Status: AC
  Filled 2016-04-11: qty 2

## 2016-04-11 MED ORDER — FENTANYL CITRATE (PF) 100 MCG/2ML IJ SOLN
INTRAMUSCULAR | Status: AC
  Filled 2016-04-11: qty 2

## 2016-04-11 MED ORDER — LIDOCAINE HCL (PF) 1 % IJ SOLN
INTRAMUSCULAR | Status: AC
  Filled 2016-04-11: qty 5

## 2016-04-11 MED ORDER — LIDOCAINE HCL (PF) 0.5 % IJ SOLN
INTRAMUSCULAR | Status: AC
  Filled 2016-04-11: qty 50

## 2016-04-11 MED ORDER — PROPOFOL 500 MG/50ML IV EMUL
INTRAVENOUS | Status: DC | PRN
  Administered 2016-04-11: 30 ug/kg/min via INTRAVENOUS

## 2016-04-11 MED ORDER — LACTATED RINGERS IV SOLN
INTRAVENOUS | Status: DC
  Administered 2016-04-11: 08:00:00 via INTRAVENOUS

## 2016-04-11 MED ORDER — MIDAZOLAM HCL 5 MG/5ML IJ SOLN
INTRAMUSCULAR | Status: DC | PRN
  Administered 2016-04-11: 2 mg via INTRAVENOUS

## 2016-04-11 MED ORDER — 0.9 % SODIUM CHLORIDE (POUR BTL) OPTIME
TOPICAL | Status: DC | PRN
  Administered 2016-04-11: 1000 mL

## 2016-04-11 MED ORDER — PROPOFOL 10 MG/ML IV BOLUS
INTRAVENOUS | Status: AC
  Filled 2016-04-11: qty 20

## 2016-04-11 MED ORDER — BUPIVACAINE HCL (PF) 0.5 % IJ SOLN
INTRAMUSCULAR | Status: DC | PRN
  Administered 2016-04-11: 10 mL

## 2016-04-11 MED ORDER — FENTANYL CITRATE (PF) 100 MCG/2ML IJ SOLN
25.0000 ug | INTRAMUSCULAR | Status: AC
  Administered 2016-04-11 (×2): 25 ug via INTRAVENOUS

## 2016-04-11 MED ORDER — MIDAZOLAM HCL 2 MG/2ML IJ SOLN
1.0000 mg | INTRAMUSCULAR | Status: AC
  Administered 2016-04-11 (×2): 2 mg via INTRAVENOUS
  Filled 2016-04-11: qty 2

## 2016-04-11 MED ORDER — LIDOCAINE HCL (PF) 0.5 % IJ SOLN
INTRAMUSCULAR | Status: DC | PRN
  Administered 2016-04-11: 250 mL via INTRAVENOUS

## 2016-04-11 MED ORDER — BUPIVACAINE HCL (PF) 0.5 % IJ SOLN
INTRAMUSCULAR | Status: AC
  Filled 2016-04-11: qty 30

## 2016-04-11 MED ORDER — FENTANYL CITRATE (PF) 100 MCG/2ML IJ SOLN
INTRAMUSCULAR | Status: DC | PRN
  Administered 2016-04-11 (×2): 25 ug via INTRAVENOUS

## 2016-04-11 MED ORDER — CEFAZOLIN SODIUM-DEXTROSE 2-4 GM/100ML-% IV SOLN
INTRAVENOUS | Status: AC
  Filled 2016-04-11: qty 100

## 2016-04-11 MED ORDER — CHLORHEXIDINE GLUCONATE 4 % EX LIQD: 60.0000 mL | Freq: Once | CUTANEOUS | Status: DC

## 2016-04-11 MED ORDER — CEFAZOLIN SODIUM-DEXTROSE 2-4 GM/100ML-% IV SOLN
2.0000 g | INTRAVENOUS | Status: AC
  Administered 2016-04-11: 2 g via INTRAVENOUS

## 2016-04-11 SURGICAL SUPPLY — 38 items
BAG HAMPER (MISCELLANEOUS) ×2 IMPLANT
BANDAGE ELASTIC 3 LF NS (GAUZE/BANDAGES/DRESSINGS) ×2 IMPLANT
BANDAGE ESMARK 4X12 BL STRL LF (DISPOSABLE) ×1 IMPLANT
BLADE SURG 15 STRL LF DISP TIS (BLADE) ×1 IMPLANT
BLADE SURG 15 STRL SS (BLADE) ×1
BNDG COHESIVE 4X5 TAN STRL (GAUZE/BANDAGES/DRESSINGS) ×2 IMPLANT
BNDG ESMARK 4X12 BLUE STRL LF (DISPOSABLE) ×2
BNDG GAUZE ELAST 4 BULKY (GAUZE/BANDAGES/DRESSINGS) ×2 IMPLANT
CHLORAPREP W/TINT 26ML (MISCELLANEOUS) ×2 IMPLANT
CLOTH BEACON ORANGE TIMEOUT ST (SAFETY) ×2 IMPLANT
COVER LIGHT HANDLE STERIS (MISCELLANEOUS) ×4 IMPLANT
CUFF TOURNIQUET SINGLE 18IN (TOURNIQUET CUFF) ×2 IMPLANT
DECANTER SPIKE VIAL GLASS SM (MISCELLANEOUS) ×2 IMPLANT
DRAPE PROXIMA HALF (DRAPES) ×2 IMPLANT
DRSG XEROFORM 1X8 (GAUZE/BANDAGES/DRESSINGS) ×2 IMPLANT
ELECT NEEDLE TIP 2.8 STRL (NEEDLE) ×2 IMPLANT
ELECT REM PT RETURN 9FT ADLT (ELECTROSURGICAL) ×2
ELECTRODE REM PT RTRN 9FT ADLT (ELECTROSURGICAL) ×1 IMPLANT
GAUZE SPONGE 4X4 12PLY STRL (GAUZE/BANDAGES/DRESSINGS) ×2 IMPLANT
GLOVE BIOGEL PI IND STRL 7.0 (GLOVE) ×1 IMPLANT
GLOVE BIOGEL PI INDICATOR 7.0 (GLOVE) ×1
GLOVE EXAM NITRILE MD LF STRL (GLOVE) ×2 IMPLANT
GLOVE SKINSENSE NS SZ8.0 LF (GLOVE) ×1
GLOVE SKINSENSE STRL SZ8.0 LF (GLOVE) ×1 IMPLANT
GLOVE SS N UNI LF 8.5 STRL (GLOVE) ×2 IMPLANT
GOWN STRL REUS W/ TWL LRG LVL3 (GOWN DISPOSABLE) ×1 IMPLANT
GOWN STRL REUS W/TWL LRG LVL3 (GOWN DISPOSABLE) ×1
GOWN STRL REUS W/TWL XL LVL3 (GOWN DISPOSABLE) ×2 IMPLANT
HAND ALUMI XLG (SOFTGOODS) ×2 IMPLANT
KIT ROOM TURNOVER APOR (KITS) ×2 IMPLANT
MANIFOLD NEPTUNE II (INSTRUMENTS) ×2 IMPLANT
NEEDLE HYPO 21X1.5 SAFETY (NEEDLE) ×2 IMPLANT
NS IRRIG 1000ML POUR BTL (IV SOLUTION) ×2 IMPLANT
PACK BASIC LIMB (CUSTOM PROCEDURE TRAY) ×2 IMPLANT
PAD ARMBOARD 7.5X6 YLW CONV (MISCELLANEOUS) ×2 IMPLANT
SET BASIN LINEN APH (SET/KITS/TRAYS/PACK) ×2 IMPLANT
SUT ETHILON 3 0 FSL (SUTURE) ×2 IMPLANT
SYR CONTROL 10ML LL (SYRINGE) ×2 IMPLANT

## 2016-04-11 NOTE — Interval H&P Note (Signed)
History and Physical Interval Note:  04/11/2016 8:42 AM  BP 112/69   Pulse 62   Temp 98.3 F (36.8 C) (Oral)   Resp 16   LMP 04/04/2016 (Exact Date)   SpO2 99%    Stacy Wolf  has presented today for surgery, with the diagnosis of left carpal tunnel syndrome  The various methods of treatment have been discussed with the patient and family. After consideration of risks, benefits and other options for treatment, the patient has consented to  Procedure(s): CARPAL TUNNEL RELEASE (Left) as a surgical intervention .  The patient's history has been reviewed, patient examined, no change in status, stable for surgery.  I have reviewed the patient's chart and labs.  Questions were answered to the patient's satisfaction.     Arther Abbott

## 2016-04-11 NOTE — Anesthesia Postprocedure Evaluation (Signed)
Anesthesia Post Note  Patient: Stacy Wolf  Procedure(s) Performed: Procedure(s) (LRB): CARPAL TUNNEL RELEASE (Left)  Patient location during evaluation: PACU Anesthesia Type: Bier Block Level of consciousness: awake and alert, oriented and patient cooperative Pain management: pain level controlled Vital Signs Assessment: post-procedure vital signs reviewed and stable Respiratory status: spontaneous breathing, nonlabored ventilation and respiratory function stable Cardiovascular status: blood pressure returned to baseline Postop Assessment: no signs of nausea or vomiting Anesthetic complications: no     Last Vitals:  Vitals:   04/11/16 0945 04/11/16 0953  BP:  (!) 118/92  Pulse: 93 (!) 56  Resp: 14 17  Temp:  36.6 C    Last Pain:  Vitals:   04/11/16 1000  TempSrc:   PainSc: Asleep                 Joandy Burget J

## 2016-04-11 NOTE — Transfer of Care (Signed)
Immediate Anesthesia Transfer of Care Note  Patient: Stacy Wolf  Procedure(s) Performed: Procedure(s): CARPAL TUNNEL RELEASE (Left)  Patient Location: PACU  Anesthesia Type:Bier block  Level of Consciousness: awake, alert  and patient cooperative  Airway & Oxygen Therapy: Patient Spontanous Breathing and Patient connected to face mask oxygen  Post-op Assessment: Report given to RN, Post -op Vital signs reviewed and stable and Patient moving all extremities  Post vital signs: Reviewed and stable  Last Vitals:  Vitals:   04/11/16 0845 04/11/16 0905  BP: 108/69 111/79  Pulse:    Resp: (!) 38 19  Temp:      Last Pain:  Vitals:   04/11/16 0752  TempSrc: Oral      Patients Stated Pain Goal: 9 ([id number])  Complications: No apparent anesthesia complications

## 2016-04-11 NOTE — Anesthesia Procedure Notes (Signed)
Anesthesia Regional Block:  Bier block (IV Regional)  Pre-Anesthetic Checklist: ,, timeout performed, Correct Patient, Correct Site, Correct Laterality, Correct Procedure,, site marked, surgical consent, pre-op evaluation, at surgeon's request  Laterality: Left  Prep: Betadine       Needles:  Injection technique: Single-shot  Needle Type: Other      Needle Gauge: 20 and 20 G    Additional Needles:  Motor weakness within 2 minutes. Bier block (IV Regional)  Nerve Stimulator or Paresthesia:   Additional Responses:  Pulse checked post tourniquet inflation. IV NSL discontinued post injection. Narrative:   Performed by: Personally

## 2016-04-11 NOTE — Brief Op Note (Signed)
04/11/2016  9:46 AM  PATIENT:  Stacy Wolf  41 y.o. female  PRE-OPERATIVE DIAGNOSIS:  left carpal tunnel syndrome  POST-OPERATIVE DIAGNOSIS:  left carpal tunnel syndrome   PROCEDURE:  Procedure(s): CARPAL TUNNEL RELEASE (Left)   Findings: Stenotic carpal tunnel/ median nerve compression normal color of the nerve   Carpal tunnel release left wrist   Indications failure of conservative treatment to relieve pain and paresthesias and numbness and tingling of the left hand  The patient was identified in the preop area we confirm the surgical site marked as left wrist. Chart update completed. Patient taken to surgery. She had 2 g of Ancef. After establishing a Bier block left her arm was prepped with ChloraPrep.  Timeout executed completed and confirmed site.  A straight incision was made over the left carpal tunnel in line with the radial border of the ring finger. Blunt dissection was carried out to find the distal aspect of the carpal tunnel. A blunted instrument was passed beneath the carpal tunnel. Sharp incision was then used to release the transverse carpal ligament. The contents of the carpal tunnel were inspected. The median nerve was compressed with slight discoloration.  The wound was irrigated and then closed with 3-0 nylon suture. We injected 10 mL of plain Marcaine on the radial side of the incision  A sterile bandage was applied and the tourniquet was released the color of the hand and capillary refill were normal  The patient was taken to the recovery room in stable condition  SURGEON:  Surgeon(s) and Role:    * [name], MD - Primary  PHYSICIAN ASSISTANT:   ASSISTANTS: none   ANESTHESIA:   regional  EBL:  Total I/O In: 300 [I.V.:300] Out: -   BLOOD ADMINISTERED:none  DRAINS: none   LOCAL MEDICATIONS USED:  MARCAINE     SPECIMEN:  No Specimen  DISPOSITION OF SPECIMEN:  N/A  COUNTS:  YES  TOURNIQUET:  * Missing tourniquet times found for  documented tourniquets in log:  [id number] *  DICTATION: .Viviann Spare Dictation  PLAN OF CARE: Discharge to home after PACU  PATIENT DISPOSITION:  PACU - hemodynamically stable.   Delay start of Pharmacological VTE agent (>24hrs) due to surgical blood loss or risk of bleeding: not applicable  [id number]

## 2016-04-11 NOTE — Anesthesia Preprocedure Evaluation (Signed)
Anesthesia Evaluation  Patient identified by MRN, date of birth, ID band Patient awake    Reviewed: Allergy & Precautions, NPO status , Patient's Chart, lab work & pertinent test results  Airway Mallampati: II  TM Distance: >3 FB     Dental  (+) Teeth Intact   Pulmonary Current Smoker,    breath sounds clear to auscultation       Cardiovascular hypertension (off meds now),  Rhythm:Regular Rate:Normal     Neuro/Psych  Headaches, PSYCHIATRIC DISORDERS Anxiety Bipolar Disorder Schizophrenia    GI/Hepatic GERD  Controlled,  Endo/Other    Renal/GU      Musculoskeletal   Abdominal   Peds  Hematology   Anesthesia Other Findings   Reproductive/Obstetrics                             Anesthesia Physical Anesthesia Plan  ASA: II  Anesthesia Plan: Bier Block   Post-op Pain Management:    Induction: Intravenous  Airway Management Planned: Simple Face Mask  Additional Equipment:   Intra-op Plan:   Post-operative Plan:   Informed Consent: I have reviewed the patients History and Physical, chart, labs and discussed the procedure including the risks, benefits and alternatives for the proposed anesthesia with the patient or authorized representative who has indicated his/her understanding and acceptance.     Plan Discussed with:   Anesthesia Plan Comments:         Anesthesia Quick Evaluation

## 2016-04-11 NOTE — Op Note (Signed)
04/11/2016  9:46 AM  PATIENT:  Stacy Wolf  41 y.o. female  PRE-OPERATIVE DIAGNOSIS:  left carpal tunnel syndrome  POST-OPERATIVE DIAGNOSIS:  left carpal tunnel syndrome   PROCEDURE:  Procedure(s): CARPAL TUNNEL RELEASE (Left)   Findings: Stenotic carpal tunnel/ median nerve compression normal color of the nerve   Carpal tunnel release left wrist   Indications failure of conservative treatment to relieve pain and paresthesias and numbness and tingling of the left hand  The patient was identified in the preop area we confirm the surgical site marked as left wrist. Chart update completed. Patient taken to surgery. She had 2 g of Ancef. After establishing a Bier block left her arm was prepped with ChloraPrep.  Timeout executed completed and confirmed site.  A straight incision was made over the left carpal tunnel in line with the radial border of the ring finger. Blunt dissection was carried out to find the distal aspect of the carpal tunnel. A blunted instrument was passed beneath the carpal tunnel. Sharp incision was then used to release the transverse carpal ligament. The contents of the carpal tunnel were inspected. The median nerve was compressed with slight discoloration.  The wound was irrigated and then closed with 3-0 nylon suture. We injected 10 mL of plain Marcaine on the radial side of the incision  A sterile bandage was applied and the tourniquet was released the color of the hand and capillary refill were normal  The patient was taken to the recovery room in stable condition  SURGEON:  Surgeon(s) and Role:    * [name], MD - Primary  PHYSICIAN ASSISTANT:   ASSISTANTS: none   ANESTHESIA:   regional  EBL:  Total I/O In: 300 [I.V.:300] Out: -   BLOOD ADMINISTERED:none  DRAINS: none   LOCAL MEDICATIONS USED:  MARCAINE     SPECIMEN:  No Specimen  DISPOSITION OF SPECIMEN:  N/A  COUNTS:  YES  TOURNIQUET:  * Missing tourniquet times found for  documented tourniquets in log:  [id number] *  DICTATION: .Viviann Spare Dictation  PLAN OF CARE: Discharge to home after PACU  PATIENT DISPOSITION:  PACU - hemodynamically stable.   Delay start of Pharmacological VTE agent (>24hrs) due to surgical blood loss or risk of bleeding: not applicable  [id number]

## 2016-04-12 ENCOUNTER — Encounter (HOSPITAL_COMMUNITY): Payer: Self-pay | Admitting: Orthopedic Surgery

## 2016-04-12 ENCOUNTER — Other Ambulatory Visit: Payer: Self-pay | Admitting: Family Medicine

## 2016-04-12 DIAGNOSIS — F17200 Nicotine dependence, unspecified, uncomplicated: Secondary | ICD-10-CM

## 2016-04-12 MED ORDER — VARENICLINE TARTRATE 1 MG PO TABS: 1.0000 mg | ORAL_TABLET | Freq: Two times a day (BID) | ORAL | 1 refills | Status: DC

## 2016-04-13 ENCOUNTER — Encounter: Payer: Medicare Other | Admitting: Physical Therapy

## 2016-04-20 ENCOUNTER — Encounter: Payer: Self-pay | Admitting: Orthopedic Surgery

## 2016-04-20 ENCOUNTER — Ambulatory Visit (INDEPENDENT_AMBULATORY_CARE_PROVIDER_SITE_OTHER): Payer: Medicare Other | Admitting: Orthopedic Surgery

## 2016-04-20 DIAGNOSIS — Z9889 Other specified postprocedural states: Secondary | ICD-10-CM

## 2016-04-20 DIAGNOSIS — Z4889 Encounter for other specified surgical aftercare: Secondary | ICD-10-CM

## 2016-04-20 NOTE — Telephone Encounter (Signed)
APPT 05/01/16 @ 8:30AM  PATIENT AWARE

## 2016-04-20 NOTE — Progress Notes (Signed)
Stacy Wolf is status post left carpal tunnel release   S:   Chief Complaint  Patient presents with  . Follow-up    LEFT CARPAL TUNNEL RELEASE, DOS 04/11/16   Doing well.   The patient complains of  Soreness over the wrist incision  Current pain medication: None  The incision is clean dry and intact with no drainage The dressing was changed.  Patient to return for suture removal ~ POD 12-14

## 2016-04-23 ENCOUNTER — Encounter: Payer: Self-pay | Admitting: Pharmacist

## 2016-04-23 ENCOUNTER — Ambulatory Visit (INDEPENDENT_AMBULATORY_CARE_PROVIDER_SITE_OTHER): Payer: Medicare Other | Admitting: Pharmacist

## 2016-04-23 VITALS — BP 122/82 | HR 74 | Ht 63.0 in | Wt 187.0 lb

## 2016-04-23 DIAGNOSIS — R358 Other polyuria: Secondary | ICD-10-CM | POA: Diagnosis not present

## 2016-04-23 DIAGNOSIS — E8881 Metabolic syndrome: Secondary | ICD-10-CM | POA: Insufficient documentation

## 2016-04-23 DIAGNOSIS — Z0000 Encounter for general adult medical examination without abnormal findings: Secondary | ICD-10-CM

## 2016-04-23 DIAGNOSIS — Z114 Encounter for screening for human immunodeficiency virus [HIV]: Secondary | ICD-10-CM | POA: Diagnosis not present

## 2016-04-23 DIAGNOSIS — E669 Obesity, unspecified: Secondary | ICD-10-CM | POA: Insufficient documentation

## 2016-04-23 DIAGNOSIS — R3589 Other polyuria: Secondary | ICD-10-CM

## 2016-04-23 DIAGNOSIS — R7309 Other abnormal glucose: Secondary | ICD-10-CM | POA: Diagnosis not present

## 2016-04-23 LAB — URINALYSIS, COMPLETE
Bilirubin, UA: NEGATIVE
Glucose, UA: NEGATIVE
Ketones, UA: NEGATIVE
LEUKOCYTES UA: NEGATIVE
NITRITE UA: NEGATIVE
PH UA: 5.5 (ref 5.0–7.5)
Protein, UA: NEGATIVE
Specific Gravity, UA: 1.02 (ref 1.005–1.030)
Urobilinogen, Ur: 0.2 mg/dL (ref 0.2–1.0)

## 2016-04-23 LAB — BAYER DCA HB A1C WAIVED: HB A1C (BAYER DCA - WAIVED): 6 % (ref <7.0)

## 2016-04-23 LAB — MICROSCOPIC EXAMINATION: RENAL EPITHEL UA: NONE SEEN /HPF

## 2016-04-23 NOTE — Patient Instructions (Addendum)
  [name] , Thank you for taking time to come for your Medicare Wellness Visit. I appreciate your ongoing commitment to your health goals. Please review the following plan we discussed and let me know if I can assist you in the future.   These are the goals we discussed:  Continue to walk daily - great way to prevent diabetes and decrease weight.   Increase non-starchy vegetables - carrots, green bean, squash, zucchini, tomatoes, onions, peppers, spinach and other green leafy vegetables, cabbage, lettuce, cucumbers, asparagus, okra (not fried), eggplant Limit sugar and processed foods (cakes, cookies, ice cream, crackers and chips) Increase fresh fruit but limit serving sizes 1/2 cup or about the size of tennis or baseball Limit red meat to no more than 1-2 times per week (serving size about the size of your palm) Choose whole grains / lean proteins - whole wheat bread, quinoa, whole grain rice (1/2 cup), fish, chicken, [place] Avoid sugar and calorie containing beverages - soda, sweet tea and juice.  Choose water or unsweetened tea instead.  Goal weight is 150 lbs - but recommend start with trying to lose 10% of weight which is about 18 lbs.   This is a list of the screening recommended for you and due dates:  Health Maintenance  Topic Date Due  . HIV Screening  09/22/1989  . Mammogram  09/25/2014  . Flu Shot  06/02/2016*  . Pap Smear  01/16/2019  . Tetanus Vaccine  08/26/2023  *Topic was postponed. The date shown is not the original due date.

## 2016-04-23 NOTE — Progress Notes (Signed)
Patient ID: Stacy Wolf, female   DOB: January 09, 1976, 41 y.o.   MRN: [id number]     Subjective:   Stacy Wolf is a black 41 y.o. female who presents for a subsequent Medicare Annual Wellness Visit. Stacy Wolf is WDWN and is in NAD.   She is disabled.  She is not married but has the same significant other for the last 7 years.  1 of her 3 children live with her (age 29 yo)  Review of Systems  Review of Systems  Constitutional: Negative.   Eyes: Positive for blurred vision (patient cannot afford cost of eye exam ).  Respiratory: Negative.   Cardiovascular: Negative.   Gastrointestinal: Positive for constipation.  Genitourinary: Positive for frequency.  Musculoskeletal: Negative.   Skin: Negative.   Endo/Heme/Allergies: Negative.   Psychiatric/Behavioral: Positive for depression.       See counselor and psychiatrist at [facility]    Current Medications (verified) Outpatient Encounter Prescriptions as of 04/23/2016  Medication Sig  . CHANTIX STARTING MONTH PAK 0.5 MG X 11 & 1 MG X 42 tablet TAKE AS DIRECTED  . ibuprofen (ADVIL,MOTRIN) 600 MG tablet Take 1 tablet (600 mg total) by mouth every 6 (six) hours as needed.  Marland Kitchen HYDROcodone-acetaminophen (NORCO) 7.5-325 MG tablet Take 1 tablet by mouth every 4 (four) hours as needed for moderate pain. (Patient not taking: Reported on 04/23/2016)  . varenicline (CHANTIX CONTINUING MONTH PAK) 1 MG tablet Take 1 tablet (1 mg total) by mouth 2 (two) times daily. (Patient not taking: Reported on 04/23/2016)  . [DISCONTINUED] cyclobenzaprine (FLEXERIL) 10 MG tablet Take 1 tablet (10 mg total) by mouth 3 (three) times daily as needed. (Patient not taking: Reported on 04/23/2016)   No facility-administered encounter medications on file as of 04/23/2016.     Allergies (verified) Latuda [lurasidone hcl]; Wellbutrin [bupropion]; Zoloft [sertraline hcl]; Haloperidol and related; Imitrex [sumatriptan]; and Nicoderm [nicotine]   History: Past Medical History:    Diagnosis Date  . Anxiety   . Bipolar 1 disorder ([place])   . Heart murmur   . Migraines   . Schizophrenia [facility])    Past Surgical History:  Procedure Laterality Date  . arm surgery Right    tendonitis-wrist  . CARPAL TUNNEL RELEASE Left 04/11/2016   Procedure: CARPAL TUNNEL RELEASE;  Surgeon: [name], MD;  Location: [facility];  Service: Orthopedics;  Laterality: Left;  . TUBAL LIGATION     Family History  Problem Relation Age of Onset  . Cancer Mother 58    breast CA at 29 and uterine CA at 16  . Hypertension Mother   . Kidney disease Mother     kidney transplant  . Cancer Sister 79    uterine  . Cancer Sister 60    uterine  . Diabetes Father   . Liver disease Father   . Cancer Maternal Aunt     breast   Social History   Occupational History  . Not on file.   Social History Main Topics  . Smoking status: Current Every Day Smoker    Packs/day: 0.50    Years: 7.00    Types: Cigarettes  . Smokeless tobacco: Never Used  . Alcohol use No  . Drug use: No  . Sexual activity: Yes    Birth control/ protection: Surgical    Do you feel safe at home?  Yes  Dietary issues and exercise activities: Current Exercise Habits: Home exercise routine, Type of exercise: walking, Time (Minutes): 20 (1 mile), Frequency (  Times/Week): 5, Weekly Exercise (Minutes/Week): 100, Intensity: Moderate  Current Dietary habits:  Lactose intolerance.  Eats oatmeal or cereal for breakfast with whole milk (helps with constipation);  Boyfriend was diagnosed as type 2 DM and has been following similar diet as him - limiting bread, sweets and potatoes.  Trying to choose lean proteins - some fried foods but has decreased recently.  Objective:    There were no vitals filed for this visit. There is no height or weight on file to calculate BMI.  Activities of Daily Living In your present state of health, do you have any difficulty performing the following activities: 04/23/2016 04/06/2016   Hearing? N N  Vision? Y N  Difficulty concentrating or making decisions? N N  Walking or climbing stairs? N N  Dressing or bathing? N N  Doing errands, shopping? N N  Preparing Food and eating ? N -  Using the Toilet? N -  In the past six months, have you accidently leaked urine? N -  Do you have problems with loss of bowel control? N -  Managing your Medications? N -  Managing your Finances? N -  Housekeeping or managing your Housekeeping? N -  Some recent data might be hidden    Are there smokers in your home (other than you)? Yes   Cardiac Risk Factors include: dyslipidemia  Depression Screen PHQ 2/9 Scores 04/23/2016 03/01/2016 02/16/2016 01/16/2016  PHQ - 2 Score 6 0 0 0  PHQ- 9 Score 17 - - -    Fall Risk Fall Risk  04/23/2016 01/16/2016 12/09/2015 04/18/2015 02/02/2015  Falls in the past year? No No No No No    Cognitive Function: MMSE - Mini Mental State Exam 04/23/2016 04/18/2015 11/26/2014  Orientation to time 5 5 5   Orientation to Place 5 5 5   Registration 3 3 3   Attention/ Calculation 4 5 2   Recall 2 3 2   Language- name 2 objects 2 2 2   Language- repeat 1 1 1   Language- follow 3 step command 3 3 3   Language- read & follow direction 1 1 1   Write a sentence 1 1 1   Copy design 1 1 1   Total score 28 30 26     Immunizations and Health Maintenance Immunization History  Administered Date(s) Administered  . Influenza,inj,Quad PF,36+ Mos 02/02/2015  . Tdap 08/25/2013   Health Maintenance Due  Topic Date Due  . HIV Screening  09/22/1989  . MAMMOGRAM  09/25/2014    Patient Care Team: Worthy Rancher, MD as PCP - General (Family Medicine)  Patient sees psychologist and psychiatrist at [facility] any recent Medical Services you may have received from other than [facility] providers in the past year (date may be approximate).    Assessment:    Annual Wellness Visit  Polyuria Blurry vision - has not have eye exam due to cost Obesity - weight has  increase over the last year Elevated BG    Screening Tests Health Maintenance  Topic Date Due  . HIV Screening  09/22/1989  . MAMMOGRAM  09/25/2014  . INFLUENZA VACCINE  06/02/2016 (Originally 10/04/2015)  . PAP SMEAR  01/16/2019  . TETANUS/TDAP  08/26/2023        Plan:   During the course of the visit Stacy Wolf was educated and counseled about the following appropriate screening and preventive services:   Vaccines to include  Influenza,  Td, -  patient refused influenza vaccine today; Tdap is UTD  Colorectal cancer screening - not required currently  Cardiovascular disease screening - EKG is UTD  BP is at goal today  Diabetes screening - Last FBG was slightly elevated - due to polyuria will check A1c today  Bone Denisty / Osteoporosis Screening - No currently indicated  Mammogram - has appt for 05/30/2016  PAP - UTD   Eye Exam - patient was given phone number for [facility] - per their web site they accept Medicaid and Medicare.  Dental Exam - UTD - is being [facility] by [facility]  Nutrition counseling - discussed limiting high sugar and calorie foods.  Recommended d/c sodas and discussed several non calorie options. Also discussed healthier snack option such as fruits and vegetables.  Smoking cessation counseling - she has stopped Chantix due to side effects.  She is trying to continue to decrease number of cigs smoked per day and has been successful at cutting back.   Advanced Directives - reviewed - patient has information and is planning to fill out  Increase physical activity - goal is 150 minutes per week.  Continue to walk daily.  I also encouraged her to decrease TV and during commercials to get up and move / exercise.  Orders Placed This Encounter  Procedures  . Bayer DCA Hb A1c Waived  . Urinalysis, Complete  . HIV antibody     Patient Instructions (the written plan) were given to the patient.   Cherre Robins, PharmD   04/23/2016

## 2016-04-24 LAB — HIV ANTIBODY (ROUTINE TESTING W REFLEX): HIV SCREEN 4TH GENERATION: NONREACTIVE

## 2016-04-25 ENCOUNTER — Telehealth: Payer: Self-pay | Admitting: Orthopedic Surgery

## 2016-04-25 ENCOUNTER — Other Ambulatory Visit: Payer: Self-pay | Admitting: *Deleted

## 2016-04-25 ENCOUNTER — Ambulatory Visit (INDEPENDENT_AMBULATORY_CARE_PROVIDER_SITE_OTHER): Payer: Self-pay | Admitting: Orthopedic Surgery

## 2016-04-25 DIAGNOSIS — Z4889 Encounter for other specified surgical aftercare: Secondary | ICD-10-CM

## 2016-04-25 MED ORDER — HYDROCODONE-ACETAMINOPHEN 5-325 MG PO TABS: 1.0000 | ORAL_TABLET | ORAL | 0 refills | Status: DC | PRN

## 2016-04-25 NOTE — Progress Notes (Signed)
PATIENT IN TODAY FOR SUTURE REMOVAL FROM LEFT WRIST S/P LEFT CARPAL TUNNEL RELEASE 04/11/16. WOUND CLEANED AND SUTURES REMOVED, PATIENT TOLERATED WELL. BAND AID PLACED OVER WOUND AND ADVISED TO KEEP CLEAN AND DRY. FOLLOW UP IN FOUR WEEKS

## 2016-04-25 NOTE — Telephone Encounter (Signed)
Patient requests a refill on Hydrocodone/Acetaminophen  7.5-325  Mgs.   Qty  30  Sig: Take 1 tablet by mouth every 4 (four) hours as needed for moderate pain.

## 2016-04-25 NOTE — Telephone Encounter (Signed)
Routing to Dr harrison for approval 

## 2016-04-25 NOTE — Telephone Encounter (Signed)
5 mg refill

## 2016-04-26 ENCOUNTER — Encounter: Payer: Self-pay | Admitting: Family Medicine

## 2016-04-26 ENCOUNTER — Ambulatory Visit (INDEPENDENT_AMBULATORY_CARE_PROVIDER_SITE_OTHER): Payer: Medicare Other | Admitting: Family Medicine

## 2016-04-26 VITALS — BP 133/89 | HR 84 | Temp 98.0°F | Ht 63.0 in | Wt 187.4 lb

## 2016-04-26 DIAGNOSIS — M791 Myalgia: Secondary | ICD-10-CM | POA: Diagnosis not present

## 2016-04-26 DIAGNOSIS — M542 Cervicalgia: Secondary | ICD-10-CM | POA: Diagnosis not present

## 2016-04-26 DIAGNOSIS — N921 Excessive and frequent menstruation with irregular cycle: Secondary | ICD-10-CM

## 2016-04-26 NOTE — Progress Notes (Signed)
BP 133/89   Pulse 84   Temp 98 F (36.7 C) (Oral)   Ht 5\' 3"  (1.6 m)   Wt 187 lb 6.4 oz (85 kg)   LMP 04/04/2016 (Exact Date)   BMI 33.20 kg/m    Subjective:    Patient ID: Stacy Wolf, female    DOB: 1975/10/19, 41 y.o.   MRN: [id number]  HPI: Stacy Wolf is a 41 y.o. female presenting on 04/26/2016 for Vaginal Bleeding (bleeding since November since Depo-Provera shot, constant heavy bleeding)   HPI I do  Breakthrough bleeding on Depo-Provera and irregular periods Patient is coming in today because she still having a lot of issues with breakthrough bleeding on Depo-Provera and we were trying to treat her irregular periods that she's had before which it did not help with. We did a short course of estrogen and she said it did not make any difference at all. She denies any lightheadedness or dizziness or chest pains. She denies any syncope or lack of energy. Her periods are still every day for the past couple months and she's having clots at times and she says her bleeding is heavy most of the time.  Relevant past medical, surgical, family and social history reviewed and updated as indicated. Interim medical history since our last visit reviewed. Allergies and medications reviewed and updated.  Review of Systems  Constitutional: Negative for chills and fever.  Respiratory: Negative for chest tightness and shortness of breath.   Cardiovascular: Negative for chest pain and leg swelling.  Gastrointestinal: Negative for abdominal pain, constipation, diarrhea, nausea and vomiting.  Genitourinary: Positive for menstrual problem and vaginal bleeding. Negative for decreased urine volume, difficulty urinating, dysuria, frequency, pelvic pain, urgency, vaginal discharge and vaginal pain.  Musculoskeletal: Negative for back pain and gait problem.  Skin: Negative for rash.  Neurological: Negative for light-headedness and headaches.  Psychiatric/Behavioral: Negative for agitation and  behavioral problems.  All other systems reviewed and are negative.   Per HPI unless specifically indicated above   Allergies as of 04/26/2016      Reactions   Latuda [lurasidone Hcl]    Suicidal thoughts   Wellbutrin [bupropion] Other (See Comments)   Seizures / lowered seizure threshold   Zoloft [sertraline Hcl] Hives   Haloperidol And Related    Leg cramps   Imitrex [sumatriptan]    Could not swallow after taking med   Nicoderm [nicotine] Dermatitis      Medication List       Accurate as of 04/26/16  1:46 PM. Always use your most recent med list.          HYDROcodone-acetaminophen 7.5-325 MG tablet Commonly known as:  NORCO Take 1 tablet by mouth every 4 (four) hours as needed for moderate pain.   HYDROcodone-acetaminophen 5-325 MG tablet Commonly known as:  NORCO/VICODIN Take 1 tablet by mouth every 4 (four) hours as needed for moderate pain.   ibuprofen 600 MG tablet Commonly known as:  ADVIL,MOTRIN Take 1 tablet (600 mg total) by mouth every 6 (six) hours as needed.          Objective:    BP 133/89   Pulse 84   Temp 98 F (36.7 C) (Oral)   Ht 5\' 3"  (1.6 m)   Wt 187 lb 6.4 oz (85 kg)   LMP 04/04/2016 (Exact Date)   BMI 33.20 kg/m   Wt Readings from Last 3 Encounters:  04/26/16 187 lb 6.4 oz (85 kg)  04/23/16 187 lb (84.8  kg)  04/06/16 186 lb (84.4 kg)    Physical Exam  Constitutional: She is oriented to person, place, and time. She appears well-developed and well-nourished. No distress.  Eyes: Conjunctivae are normal.  Cardiovascular: Normal rate, regular rhythm, normal heart sounds and intact distal pulses.   No murmur heard. Pulmonary/Chest: Effort normal and breath sounds normal. No respiratory distress. She has no wheezes. She has no rales.  Abdominal: Soft. Bowel sounds are normal. She exhibits no distension. There is no tenderness. There is no rebound and no guarding.  Musculoskeletal: Normal range of motion. She exhibits no edema or  tenderness.  Neurological: She is alert and oriented to person, place, and time. Coordination normal.  Skin: Skin is warm and dry. No rash noted. She is not diaphoretic.  Psychiatric: She has a normal mood and affect. Her behavior is normal.  Nursing note and vitals reviewed.     Assessment & Plan:   Problem List Items Addressed This Visit    None    Visit Diagnoses    Breakthrough bleeding on Depo-Provera    -  Primary   Relevant Orders   Ambulatory referral to Obstetrics / Gynecology   Menorrhagia with irregular cycle       Relevant Orders   Ambulatory referral to Obstetrics / Gynecology       Follow up plan: Return if symptoms worsen or fail to improve.  Counseling provided for all of the vaccine components Orders Placed This Encounter  Procedures  . Ambulatory referral to Obstetrics / Gynecology    Caryl Pina, MD [facility] 04/26/2016, 1:46 PM

## 2016-05-07 ENCOUNTER — Encounter: Payer: Self-pay | Admitting: Women's Health

## 2016-05-07 ENCOUNTER — Ambulatory Visit (INDEPENDENT_AMBULATORY_CARE_PROVIDER_SITE_OTHER): Payer: Medicare Other | Admitting: Women's Health

## 2016-05-07 VITALS — BP 130/80 | HR 76 | Ht 63.0 in | Wt 192.0 lb

## 2016-05-07 DIAGNOSIS — N898 Other specified noninflammatory disorders of vagina: Secondary | ICD-10-CM | POA: Diagnosis not present

## 2016-05-07 DIAGNOSIS — R350 Frequency of micturition: Secondary | ICD-10-CM | POA: Diagnosis not present

## 2016-05-07 DIAGNOSIS — N760 Acute vaginitis: Secondary | ICD-10-CM | POA: Diagnosis not present

## 2016-05-07 DIAGNOSIS — F17210 Nicotine dependence, cigarettes, uncomplicated: Secondary | ICD-10-CM

## 2016-05-07 DIAGNOSIS — R809 Proteinuria, unspecified: Secondary | ICD-10-CM | POA: Diagnosis not present

## 2016-05-07 DIAGNOSIS — N938 Other specified abnormal uterine and vaginal bleeding: Secondary | ICD-10-CM | POA: Diagnosis not present

## 2016-05-07 DIAGNOSIS — R319 Hematuria, unspecified: Secondary | ICD-10-CM

## 2016-05-07 DIAGNOSIS — N926 Irregular menstruation, unspecified: Secondary | ICD-10-CM

## 2016-05-07 DIAGNOSIS — N921 Excessive and frequent menstruation with irregular cycle: Secondary | ICD-10-CM | POA: Diagnosis not present

## 2016-05-07 LAB — POCT WET PREP (WET MOUNT)
Clue Cells Wet Prep Whiff POC: NEGATIVE
TRICHOMONAS WET PREP HPF POC: ABSENT

## 2016-05-07 LAB — POCT URINALYSIS DIPSTICK
Blood, UA: NEGATIVE
Glucose, UA: NEGATIVE
KETONES UA: NEGATIVE
Leukocytes, UA: NEGATIVE
Nitrite, UA: NEGATIVE
PROTEIN UA: 1

## 2016-05-07 NOTE — Progress Notes (Signed)
   [facility] Visit  Patient name: Stacy Wolf MRN [id number]  Date of birth: 1975-09-14  CC & HPI:  [name] is a 41 y.o. [id number] African American female presenting today as referral from PCP for report of blood in urine and period x 3 months. States she got 1st depo 11/13 d/t periods twice/month for previous 82mths- would only bleed for 3d at beginning and again at end of month, so tried depo to see if would stop periods. Heavy bleeding from 11/13 to 2/15, had to change tampon and pad q 4hrs, no clots/cramping. Was due for next shot recently, pt does not want to get depo again. States she's had her tubes tied, was only trying it to see if periods would stop. Thinks periods were becoming more frequent b/c she's 'going through the change'. Mom was 57yo when she went through menopause. Has older sister who she does not speak to. Pt does smoke 3 cigarettes/day.  Is sexually active, no h/o fibroids. Last pap 01/16/16, normal w/ PCP.  States she was not bleeding last check when was told she still had blood in her urine. Does report some urinary frequency. No dysuria, urgency.  Patient's last menstrual period was 01/16/2016. The current method of family planning is tubal ligation. Last pap 01/16/16  Pertinent History Reviewed:  Medical & Surgical Hx:   Past medical, surgical, family, and social history reviewed in electronic medical record Medications: Reviewed & Updated - see associated section Allergies: Reviewed in electronic medical record  Objective Findings:  Vitals: BP 130/80   Pulse 76   Ht 5\' 3"  (1.6 m)   Wt 192 lb (87.1 kg)   LMP 01/16/2016   BMI 34.01 kg/m  Body mass index is 34.01 kg/m.  Physical Examination: General appearance - alert, well appearing, and in no distress Pelvic - small amt normal appearing white nonodorous d/c Bimanual- uterus/adnexae normal, small amt suprapubic pain w/ palpation  Results for orders placed or performed in visit on 05/07/16  (from the past 24 hour(s))  POCT urinalysis dipstick   Collection Time: 05/07/16  8:51 AM  Result Value Ref Range   Color, UA     Clarity, UA     Glucose, UA neg    Bilirubin, UA     Ketones, UA neg    Spec Grav, UA     Blood, UA neg    pH, UA     Protein, UA 1    Urobilinogen, UA     Nitrite, UA neg    Leukocytes, UA Negative Negative     Assessment & Plan:  A:   BTB on 1st depo, now stopped  1+proteinuria, mild urinary frequency  P:  Send gc/ct and urine cx  Discussed 42mth of bleeding was likely from the depo shot itself, pt not interested in trying anything else to stop periods at this time, is content w/ having 2 light/short periods at beginning/end of month  F/u Prn  Tawnya Crook CNM, WHNP-BC 05/07/2016 9:07 AM

## 2016-05-08 ENCOUNTER — Telehealth: Payer: Self-pay | Admitting: Orthopedic Surgery

## 2016-05-08 LAB — GC/CHLAMYDIA PROBE AMP
Chlamydia trachomatis, NAA: NEGATIVE
Neisseria gonorrhoeae by PCR: NEGATIVE

## 2016-05-08 NOTE — Telephone Encounter (Signed)
Patient called to let Dr. Aline Brochure know that the Hydrocodone is not working.   Please advise.

## 2016-05-08 NOTE — Telephone Encounter (Signed)
Routing to Dr. Harrison to advise 

## 2016-05-09 LAB — URINE CULTURE

## 2016-05-14 NOTE — Telephone Encounter (Signed)
Add ibuprofen no other opioids for this surgery

## 2016-05-15 NOTE — Telephone Encounter (Signed)
Called patient, no answer 

## 2016-05-17 NOTE — Telephone Encounter (Signed)
Called patient, no answer 

## 2016-05-25 ENCOUNTER — Ambulatory Visit: Payer: Medicare Other | Admitting: Orthopedic Surgery

## 2016-05-30 ENCOUNTER — Telehealth: Payer: Self-pay | Admitting: Family Medicine

## 2016-05-30 ENCOUNTER — Encounter: Payer: Medicare Other | Admitting: *Deleted

## 2016-05-30 DIAGNOSIS — S161XXD Strain of muscle, fascia and tendon at neck level, subsequent encounter: Secondary | ICD-10-CM

## 2016-05-30 DIAGNOSIS — Z1231 Encounter for screening mammogram for malignant neoplasm of breast: Secondary | ICD-10-CM | POA: Diagnosis not present

## 2016-05-30 NOTE — Telephone Encounter (Signed)
What type of referral do you need? neck  Have you been seen at our office for this problem? yes (If no, schedule them an appointment.  They will need to be seen before a referral can be done.)  Is there a particular doctor or location that you prefer?physical therapy next door  Patient notified that referrals can take up to a week or longer to process. If they haven't heard anything within a week they should call back and speak with the referral department.

## 2016-05-30 NOTE — Telephone Encounter (Signed)
Please review and advise.

## 2016-05-31 NOTE — Telephone Encounter (Signed)
Referral order placed and pt is aware.

## 2016-05-31 NOTE — Telephone Encounter (Signed)
Yes go ahead and do a referral for physical therapy, neck strain as the diagnosis.

## 2016-06-05 ENCOUNTER — Ambulatory Visit (INDEPENDENT_AMBULATORY_CARE_PROVIDER_SITE_OTHER): Payer: Self-pay | Admitting: Orthopedic Surgery

## 2016-06-05 ENCOUNTER — Encounter: Payer: Self-pay | Admitting: Orthopedic Surgery

## 2016-06-05 DIAGNOSIS — Z4889 Encounter for other specified surgical aftercare: Secondary | ICD-10-CM

## 2016-06-05 DIAGNOSIS — Z9889 Other specified postprocedural states: Secondary | ICD-10-CM

## 2016-06-05 MED ORDER — HYDROCODONE-ACETAMINOPHEN 5-325 MG PO TABS: 1.0000 | ORAL_TABLET | ORAL | 0 refills | Status: DC | PRN

## 2016-06-05 MED ORDER — PREDNISONE 10 MG PO TABS: 10.0000 mg | ORAL_TABLET | Freq: Three times a day (TID) | ORAL | 1 refills | Status: DC

## 2016-06-05 MED ORDER — PREDNISONE 10 MG PO TABS
10.0000 mg | ORAL_TABLET | Freq: Three times a day (TID) | ORAL | 1 refills | Status: DC
Start: 2016-06-05 — End: 2016-07-16

## 2016-06-05 NOTE — Patient Instructions (Addendum)
SOFT MEDIUM  AND FIRM TOOTHBRUSHES   RUB WITH SOFT 3 X A DAY FOR 2 WEEKS THEN THE MEDIUM AND THEN THE FIRM  TAKE 10 MG STERAPRED 3 X A DAY AND HYDROCODONE 5 MG 3 X A DAY

## 2016-06-05 NOTE — Addendum Note (Signed)
Addended by: [name] on: 06/05/2016 04:46 PM   Modules accepted: Orders

## 2016-06-05 NOTE — Progress Notes (Signed)
Patient ID: Stacy Wolf, female   DOB: 1975-12-19, 41 y.o.   MRN: [id number]  POSTOP VISIT   Chief Complaint  Patient presents with  . Follow-up    POST OP LEFT CTR, DOS 04/11/16   Encounter Diagnoses  Name Primary?  Marland Kitchen Aftercare following surgery Yes  . History of carpal tunnel surgery of left wrist     Complains of tenderness over the scar requiring to hydrocodone 5 mg tablets for relief  She's having no pain numbness or tingling in the fingertips  Exam shows no swelling of the finger tenderness over the scar sensitivity over the scar is well  Recommend 10 mg of prednisone 3 times a day 25 mg of hydrocodone 3 times a day and desensitization with toothbrush therapy for 6 weeks  Meds ordered this encounter  Medications  . predniSONE (DELTASONE) 10 MG tablet    Sig: Take 1 tablet (10 mg total) by mouth 3 (three) times daily.    Dispense:  60 tablet    Refill:  1  . HYDROcodone-acetaminophen (NORCO/VICODIN) 5-325 MG tablet    Sig: Take 1 tablet by mouth every 4 (four) hours as needed for moderate pain.    Dispense:  60 tablet    Refill:  0    4:34 PM Arther Abbott, MD 06/05/2016

## 2016-06-06 ENCOUNTER — Ambulatory Visit: Payer: No Typology Code available for payment source | Attending: Family Medicine | Admitting: Physical Therapy

## 2016-06-06 DIAGNOSIS — R293 Abnormal posture: Secondary | ICD-10-CM | POA: Diagnosis not present

## 2016-06-06 DIAGNOSIS — M542 Cervicalgia: Secondary | ICD-10-CM | POA: Diagnosis not present

## 2016-06-06 NOTE — Therapy (Signed)
[facility] [place], [place], [address] Phone: [phone]   Fax:  [phone]  Physical Therapy Evaluation  Patient Details  Name: Stacy Wolf MRN: [id number] Date of Birth: 1975/05/14 Referring Provider: Vonna Kotyk Dettinger MD.  Encounter Date: 06/06/2016      PT End of Session - 06/06/16 1331    Visit Number 1   Number of Visits 12   Date for PT Re-Evaluation 07/18/16   PT Start Time 2800   PT Stop Time 1345   PT Time Calculation (min) 42 min   Activity Tolerance Patient tolerated treatment well   Behavior During Therapy [facility] for tasks assessed/performed      Past Medical History:  Diagnosis Date  . Anxiety   . Bipolar 1 disorder ([place])   . Heart murmur   . Migraines   . Schizophrenia [facility])     Past Surgical History:  Procedure Laterality Date  . arm surgery Right    tendonitis-wrist  . CARPAL TUNNEL RELEASE Left 04/11/2016   Procedure: CARPAL TUNNEL RELEASE;  Surgeon: [name], MD;  Location: [facility];  Service: Orthopedics;  Laterality: Left;  . TUBAL LIGATION      There were no vitals filed for this visit.       Subjective Assessment - 06/06/16 1305    Subjective Pt returns to [facility] due to neck pain.  Pt reports she was in [facility] Dec 2017, and came to [place] 3 times and stopped coming due to transportation.  Pt reports when she tried to return she needed a new referral.  Pt states continued pain in neck affecting ADLs.     Pertinent History anxiety, bipolar, schizophrenia, migraines, heart murmur   Limitations House hold activities   Patient Stated Goals Get out of pain.   Currently in Pain? Yes   Pain Score 6    Pain Location Neck   Pain Orientation Posterior;Left  previously Rt side (last PT sessions)   Pain Descriptors / Indicators Tightness   Pain Type Chronic pain   Pain Onset More than a month ago   Pain Frequency Intermittent   Aggravating Factors  movement, lying down without heat   Pain Relieving Factors ice, heat, medication            [facility] PT Assessment - 06/06/16 1308      Assessment   Medical Diagnosis Neck Strain   Referring Provider Vonna Kotyk Dettinger MD.   Onset Date/Surgical Date 02/10/16   Hand Dominance Right   Next MD Visit 07/16/16   Prior Therapy at this clinic x 3 visit; d/c due to no shows     Precautions   Precautions None     Restrictions   Weight Bearing Restrictions No     Balance Screen   Has the patient fallen in the past 6 months No   Has the patient had a decrease in activity level because of a fear of falling?  No   Is the patient reluctant to leave their home because of a fear of falling?  No     Home Ecologist residence     Prior Function   Level of Independence Independent   Vocation On disability   Leisure play games on phone (pt did not put phone down during session playing game), cook     Cognition   Overall Cognitive Status Within Functional Limits for tasks assessed     Observation/Other Assessments   Focus on Therapeutic Outcomes (  FOTO)  53 (47% limited; predicted 36% limited)     Posture/Postural Control   Posture/Postural Control Postural limitations   Postural Limitations Rounded Shoulders;Forward head     AROM   AROM Assessment Site Cervical   Cervical Flexion 26   Cervical Extension 32   Cervical - Right Side Bend 40   Cervical - Left Side Bend 40   Cervical - Right Rotation 62   Cervical - Left Rotation 56     Strength   Overall Strength Comments Normal bilateral UE strength.   Strength Assessment Site --   Right/Left Shoulder --     Palpation   Palpation comment Tender to palpation over patient's left cervical paraspinal musculature/UT.     Special Tests    Special Tests Cervical   Cervical Tests Spurling's;Dictraction     Spurling's   Comment no change in symptoms     Distraction Test   Comment increase in symptoms                   [facility]  Adult PT Treatment/Exercise - 06/06/16 1308      Self-Care   Self-Care Other Self-Care Comments   Other Self-Care Comments  reviewed HEP given 03/12/16; reinforced compliance as pt reports she never did exercises prior     Modalities   Modalities Electrical Stimulation;Moist Heat;Ultrasound     Moist Heat Therapy   Number Minutes Moist Heat 15 Minutes   Moist Heat Location Cervical     Electrical Stimulation   Electrical Stimulation Location bil cervical region   Electrical Stimulation Action pre mod   Electrical Stimulation Parameters to tolerance x 15 min   Electrical Stimulation Goals Pain                PT Education - 06/06/16 1331    Education provided Yes   Education Details reviewed HEP, reinforced need for compliance, posture and effects on neck   Person(s) Educated Patient   Methods Explanation;Demonstration;Handout   Comprehension Verbalized understanding             PT Long Term Goals - 06/06/16 1336      PT LONG TERM GOAL #1   Title Independent with a HEP. (07/18/16)   Time 6   Period Weeks   Status New     PT LONG TERM GOAL #2   Title Increase active cervical rotation to 75 degrees+ so patient can turn head more easily while driving. (8/34/19)   Time 6   Period Weeks   Status New     PT LONG TERM GOAL #3   Title report ability to perform ADLs with pain < 3/10 for improved function (07/18/16)   Time 6   Period Weeks   Status New     PT LONG TERM GOAL #5   Baseline 6               Plan - 06/06/16 1332    Clinical Impression Statement Pt is a 41 y/o female who returns to [place] for low complexity PT eval for neck pain.  Pt seen at this clinic x 2-3 sessions in January then no showed and did not return to PT after, reporting today due to transportation issues.  Pt also reports non-compliance with HEP and on phone at least 95% of session today.  Of note, pt c/o Rt sided neck pain at last evaluation, and today c/o increased pain on Lt side  and no complaints of Rt side.  Educated on need  for compliance with HEP (provided new copy today), and effects of forward head posture on neck as well as increased time on phone affecting this.  Pt will benefit from PT to address deficits listed.   Rehab Potential Good   PT Frequency 2x / week   PT Duration 6 weeks   PT Treatment/Interventions ADLs/Self Care Home Management;Cryotherapy;Electrical Stimulation;Moist Heat;Traction;Ultrasound;Patient/family education;Therapeutic exercise;Therapeutic activities;Manual techniques;Passive range of motion;Dry needling   PT Next Visit Plan cont with POC for Modalites and STW/M to right cervical musculature; AROM to include chin tucks and extension.   Consulted and Agree with Plan of Care Patient      Patient will benefit from skilled therapeutic intervention in order to improve the following deficits and impairments:  Pain, Decreased activity tolerance, Decreased range of motion, Postural dysfunction  Visit Diagnosis: Cervicalgia - Plan: PT plan of care cert/re-cert  Abnormal posture - Plan: PT plan of care cert/re-cert      G-Codes - 24/46/28 1338    Functional Assessment Tool Used (Outpatient Only) FOTO clinical judgement   Functional Limitation Carrying, moving and handling objects   Carrying, Moving and Handling Objects Current Status (M3817) At least 40 percent but less than 60 percent impaired, limited or restricted   Carrying, Moving and Handling Objects Goal Status (R1165) At least 20 percent but less than 40 percent impaired, limited or restricted       Problem List Patient Active Problem List   Diagnosis Date Noted  . Obesity (BMI 30.0-34.9) 04/23/2016  . Metabolic syndrome 79/05/8331  . Carpal tunnel syndrome, left upper limb   . Essential hypertension, benign 01/16/2016  . Migraine headache 04/18/2015  . Tobacco abuse 02/02/2015  . Constipation 02/02/2015  . Gastroesophageal reflux disease without esophagitis 08/24/2014  .  Hyperlipidemia LDL goal <100 09/14/2013  . Bipolar disorder ([place]) 08/25/2013      Laureen Abrahams, PT, DPT 06/06/16 1:40 PM   [facility] [address] [place], [place], [address] Phone: [phone]   Fax:  [phone]  Name: Stacy Wolf MRN: [id number] Date of Birth: 1975-08-20

## 2016-06-06 NOTE — Patient Instructions (Signed)
AROM: Neck Rotation   Turn head slowly to look over one shoulder, then the other. Hold each position _10___ seconds. Repeat _5___ times per set. Do __2__ sets per session. Do _2-3___ sessions per day.  http://orth.exer.us/294   Copyright  VHI. All rights reserved.  AROM: Lateral Neck Flexion   Slowly tilt head toward one shoulder, then the other. Hold each position _10___ seconds. Repeat __5__ times per set. Do __2__ sets per session. Do __2-3__ sessions per day.  http://orth.exer.us/296   Copyright  VHI. All rights reserved.  Stretch Break - Chin Tuck   Looking straight forward, tuck chin and hold __10__ seconds. Relax and return to starting position. Repeat __5-10__ times every _3-4___ hours.  Copyright  VHI. All rights reserved.  Stretch Break - Chest and Shoulder Stretch   Maintaining erect posture, draw shoulders back while bringing elbows back and inward. Return to starting position. Repeat __10-20__ times every _3-4___ hours.  Copyright  VHI. All rights reserved.    

## 2016-06-12 ENCOUNTER — Encounter: Payer: Self-pay | Admitting: Physical Therapy

## 2016-06-12 ENCOUNTER — Ambulatory Visit: Payer: No Typology Code available for payment source | Admitting: Physical Therapy

## 2016-06-12 DIAGNOSIS — R293 Abnormal posture: Secondary | ICD-10-CM

## 2016-06-12 DIAGNOSIS — M542 Cervicalgia: Secondary | ICD-10-CM

## 2016-06-12 NOTE — Patient Instructions (Signed)
   Hold the chair with your hand and lean your head to the opposite side.  Hold 10 seconds Repeat 3-5 times on each side       Keeping your elbows against the wall, bring your arms up above your head. Repeat 10 times 1-2 times each day

## 2016-06-12 NOTE — Therapy (Signed)
[facility] [place], [place], [address] Phone: [phone]   Fax:  [phone]  Physical Therapy Treatment  Patient Details  Name: Stacy Wolf MRN: [id number] Date of Birth: 1975/11/07 Referring Provider: Caryl Pina, MD  Encounter Date: 06/12/2016      PT End of Session - 06/12/16 1327    Visit Number 2   Number of Visits 12   Date for PT Re-Evaluation 07/18/16   PT Start Time 6160   PT Stop Time 1345   PT Time Calculation (min) 40 min   Activity Tolerance Patient tolerated treatment well   Behavior During Therapy [facility] for tasks assessed/performed      Past Medical History:  Diagnosis Date  . Anxiety   . Bipolar 1 disorder ([place])   . Heart murmur   . Migraines   . Schizophrenia [facility])     Past Surgical History:  Procedure Laterality Date  . arm surgery Right    tendonitis-wrist  . CARPAL TUNNEL RELEASE Left 04/11/2016   Procedure: CARPAL TUNNEL RELEASE;  Surgeon: [name], MD;  Location: [facility];  Service: Orthopedics;  Laterality: Left;  . TUBAL LIGATION      There were no vitals filed for this visit.      Subjective Assessment - 06/12/16 1322    Subjective Patient arriving to therapy complaining of 5/10 right sided neck pain.  Pt reports the neck exercises that she recieved last visit have been helping.    Pertinent History anxiety, bipolar, schizophrenia, migraines, heart murmur   Limitations House hold activities   Patient Stated Goals Get out of pain.   Currently in Pain? Yes   Pain Score 5    Pain Location Neck   Pain Orientation Right   Pain Type Chronic pain   Pain Onset More than a month ago   Pain Frequency Intermittent   Aggravating Factors  movements   Pain Relieving Factors heat, medications            [facility] PT Assessment - 06/12/16 0001      Assessment   Medical Diagnosis Neck Strain   Referring Provider Caryl Pina, MD   Onset Date/Surgical Date 02/10/16    Hand Dominance Right   Next MD Visit 07/16/16   Prior Therapy at this clinic x 3 visit; d/c due to no shows                     [place] Adult PT Treatment/Exercise - 06/12/16 0001      Exercises   Exercises Neck     Neck Exercises: Standing   Other Standing Exercises wall angels x 10 holding 3 seconds each, standing posture against the wall     Neck Exercises: Seated   Neck Retraction 10 reps;3 secs   Other Seated Exercise Upper Trap stretch x 3 each side holding 10 seconds each     Neck Exercises: Supine   Cervical Isometrics Flexion;Extension;Right lateral flexion;Left lateral flexion;5 secs   Neck Retraction 10 reps;5 secs   Lateral Flexion Both;10 reps   Other Supine Exercise Open book pectoralis stretch x 3 holding 30 seconds each.                 PT Education - 06/12/16 1326    Education provided Yes   Education Details reviewed HEP   Person(s) Educated Patient   Methods Explanation   Comprehension Verbalized understanding;Returned demonstration  PT Long Term Goals - 06/12/16 1345      PT LONG TERM GOAL #1   Title Independent with a HEP. (07/18/16)   Period Weeks   Status On-going     PT LONG TERM GOAL #2   Title Increase active cervical rotation to 75 degrees+ so patient can turn head more easily while driving. (0/03/70)   Time 6   Status New     PT LONG TERM GOAL #3   Title report ability to perform ADLs with pain < 3/10 for improved function (07/18/16)   Time 6   Period Weeks   Status New               Plan - 06/12/16 1342    Clinical Impression Statement Patient arriving to therapy today with 5/10 right sided neck pain. Pt reporting her HEP from last visit seems to be helping. Pt also reporting decreased headaches since doing her exercises at home. Pt attentive during session today and tolerated cervical exercises well. Pt reporting less pain at end of session 3-4/10 pain. Pt was issued 2 new exercises to add  to her program. Pt did not wish to perform the electrical stimulation today due to time. Continue with skilled PT as pt tolerates to progress toward goals set.    Rehab Potential Good   PT Frequency 2x / week   PT Duration 6 weeks   PT Treatment/Interventions ADLs/Self Care Home Management;Cryotherapy;Electrical Stimulation;Moist Heat;Traction;Ultrasound;Patient/family education;Therapeutic exercise;Therapeutic activities;Manual techniques;Passive range of motion;Dry needling   PT Next Visit Plan AROM to include chin tucks, extension, modalities, STW to cervical musculature as pt tolerates   PT Home Exercise Plan cervical retraction, Upper trap stretch, wall angels   Consulted and Agree with Plan of Care Patient      Patient will benefit from skilled therapeutic intervention in order to improve the following deficits and impairments:  Pain, Decreased activity tolerance, Decreased range of motion, Postural dysfunction  Visit Diagnosis: Cervicalgia  Abnormal posture     Problem List Patient Active Problem List   Diagnosis Date Noted  . Obesity (BMI 30.0-34.9) 04/23/2016  . Metabolic syndrome 48/88/9169  . Carpal tunnel syndrome, left upper limb   . Essential hypertension, benign 01/16/2016  . Migraine headache 04/18/2015  . Tobacco abuse 02/02/2015  . Constipation 02/02/2015  . Gastroesophageal reflux disease without esophagitis 08/24/2014  . Hyperlipidemia LDL goal <100 09/14/2013  . Bipolar disorder ([place]) 08/25/2013    Stacy Wolf,  MPT 06/12/2016, 1:55 PM  [facility] [place], [place], [address] Phone: [phone]   Fax:  [phone]  Name: Stacy Wolf MRN: [id number] Date of Birth: 07/08/1975

## 2016-06-14 ENCOUNTER — Encounter: Payer: Self-pay | Admitting: Physical Therapy

## 2016-06-14 ENCOUNTER — Ambulatory Visit: Payer: No Typology Code available for payment source | Admitting: Physical Therapy

## 2016-06-14 DIAGNOSIS — M542 Cervicalgia: Secondary | ICD-10-CM

## 2016-06-14 DIAGNOSIS — R293 Abnormal posture: Secondary | ICD-10-CM

## 2016-06-14 NOTE — Therapy (Signed)
[facility] [place], [place], [address] Phone: [phone]   Fax:  [phone]  Physical Therapy Treatment  Patient Details  Name: Stacy Wolf MRN: [id number] Date of Birth: 05-03-1975 Referring Provider: Caryl Pina, MD  Encounter Date: 06/14/2016      PT End of Session - 06/14/16 1351    Visit Number 3   Number of Visits 12   Date for PT Re-Evaluation 07/18/16   PT Start Time 1300   PT Stop Time 1340   PT Time Calculation (min) 40 min   Activity Tolerance Patient tolerated treatment well   Behavior During Therapy [facility] for tasks assessed/performed      Past Medical History:  Diagnosis Date  . Anxiety   . Bipolar 1 disorder ([place])   . Heart murmur   . Migraines   . Schizophrenia [facility])     Past Surgical History:  Procedure Laterality Date  . arm surgery Right    tendonitis-wrist  . CARPAL TUNNEL RELEASE Left 04/11/2016   Procedure: CARPAL TUNNEL RELEASE;  Surgeon: [name], MD;  Location: [facility];  Service: Orthopedics;  Laterality: Left;  . TUBAL LIGATION      There were no vitals filed for this visit.      Subjective Assessment - 06/14/16 1348    Subjective Pt arriving to therpay complaining of 2/10 right sided neck pain. Pt reporting that she is doing her exercises 1-2 times daily. Pt also reporting she is going on a trip next week to [facility] and will not be able to attend therapy, but has appointments set up the following week.    Pertinent History anxiety, bipolar, schizophrenia, migraines, heart murmur   Limitations House hold activities   Patient Stated Goals Get out of pain.   Currently in Pain? Yes   Pain Score 2    Pain Location Neck   Pain Orientation Right   Pain Descriptors / Indicators Tightness;Sore   Pain Type Chronic pain   Pain Onset More than a month ago   Pain Frequency Intermittent   Aggravating Factors  movements   Pain Relieving Factors heat, medication, exercises   Multiple Pain Sites No            [facility] PT Assessment - 06/14/16 0001      Assessment   Medical Diagnosis Neck Strain   Referring Provider Caryl Pina, MD   Onset Date/Surgical Date 02/10/16   Hand Dominance Right   Next MD Visit 07/16/16                     [facility] Adult PT Treatment/Exercise - 06/14/16 0001      Exercises   Exercises Neck     Neck Exercises: Theraband   Scapula Retraction 5 reps   Rows 20 reps;Red   Rows Limitations 10 reps, green     Neck Exercises: Standing   Neck Retraction 10 reps;5 secs   UE D1 Weights (lbs) bilateral hands on weighted ball (4 pounds) x 10 each direction   UE D1 Limitations with head turns   Other Standing Exercises wall angels x 10 holding 3 seconds each, standing posture against the wall     Neck Exercises: Seated   Neck Retraction 10 reps;3 secs     Neck Exercises: Supine   Cervical Isometrics Flexion;Extension;Right lateral flexion;Left lateral flexion;5 secs   Neck Retraction 10 reps;5 secs   Capital Flexion 5 reps;3 secs   Lateral Flexion Both;10 reps  Other Supine Exercise Open book pectoralis stretch x 3 holding 30 seconds each.    Other Supine Exercise shoulder flexion with cane x 10     Modalities   Modalities Cryotherapy     Cryotherapy   Number Minutes Cryotherapy 10 Minutes   Cryotherapy Location Cervical   Type of Cryotherapy Ice pack                PT Education - 06/14/16 1350    Education provided Yes   Education Details instructed to continue her HEP while on vacation, Issued green theraband for rows   Person(s) Educated Patient   Methods Explanation   Comprehension Verbalized understanding             PT Long Term Goals - 06/14/16 1401      PT LONG TERM GOAL #1   Title Independent with a HEP. (07/18/16)   Time 6   Period Weeks   Status On-going     PT LONG TERM GOAL #2   Title Increase active cervical rotation to 75 degrees+ so patient can turn head more  easily while driving. (7/37/10)   Time 6   Period Weeks   Status New     PT LONG TERM GOAL #3   Title report ability to perform ADLs with pain < 3/10 for improved function (07/18/16)   Time 6   Period Weeks   Status New               Plan - 06/14/16 1352    Clinical Impression Statement Patient arriving to therapy today complaining of 2/10 right sided neck pain. Pt reporting her HEP is working and she feels much better. Pt attentive during today's session. Pt tolerated new strengthening exericses as well as shoulder ROM/strengthening today. Patient is progressing nicely with therapy.    Rehab Potential Good   PT Frequency 2x / week   PT Duration 6 weeks   PT Treatment/Interventions ADLs/Self Care Home Management;Cryotherapy;Electrical Stimulation;Moist Heat;Traction;Ultrasound;Patient/family education;Therapeutic exercise;Therapeutic activities;Manual techniques;Passive range of motion;Dry needling   PT Next Visit Plan AROM to include chin tucks, extension, modalities, STW to cervical musculature as pt tolerates   PT Home Exercise Plan cervical retraction, Upper trap stretch, wall angels   Consulted and Agree with Plan of Care Patient      Patient will benefit from skilled therapeutic intervention in order to improve the following deficits and impairments:  Pain, Decreased activity tolerance, Decreased range of motion, Postural dysfunction  Visit Diagnosis: Cervicalgia  Abnormal posture     Problem List Patient Active Problem List   Diagnosis Date Noted  . Obesity (BMI 30.0-34.9) 04/23/2016  . Metabolic syndrome 62/69/4854  . Carpal tunnel syndrome, left upper limb   . Essential hypertension, benign 01/16/2016  . Migraine headache 04/18/2015  . Tobacco abuse 02/02/2015  . Constipation 02/02/2015  . Gastroesophageal reflux disease without esophagitis 08/24/2014  . Hyperlipidemia LDL goal <100 09/14/2013  . Bipolar disorder ([place]) 08/25/2013    Oretha Caprice,  MPT 06/14/2016, 2:09 PM  [facility] [place], [place], [address] Phone: [phone]   Fax:  [phone]  Name: Stacy Wolf MRN: [id number] Date of Birth: 1975/11/06

## 2016-06-25 ENCOUNTER — Encounter: Payer: Self-pay | Admitting: Physical Therapy

## 2016-06-25 ENCOUNTER — Ambulatory Visit: Payer: No Typology Code available for payment source | Admitting: Physical Therapy

## 2016-06-25 DIAGNOSIS — R293 Abnormal posture: Secondary | ICD-10-CM | POA: Diagnosis not present

## 2016-06-25 DIAGNOSIS — M542 Cervicalgia: Secondary | ICD-10-CM

## 2016-06-25 NOTE — Therapy (Signed)
[facility] [place], [place], [address] Phone: [phone]   Fax:  [phone]  Physical Therapy Treatment  Patient Details  Name: Stacy Wolf MRN: [id number] Date of Birth: 12/21/1975 Referring Provider: Caryl Pina, MD  Encounter Date: 06/25/2016      PT End of Session - 06/25/16 0754    Visit Number 4   Number of Visits 12   Date for PT Re-Evaluation 07/18/16   PT Start Time 0733   PT Stop Time 0758   PT Time Calculation (min) 25 min   Activity Tolerance Patient tolerated treatment well   Behavior During Therapy [facility] for tasks assessed/performed      Past Medical History:  Diagnosis Date  . Anxiety   . Bipolar 1 disorder ([place])   . Heart murmur   . Migraines   . Schizophrenia [facility])     Past Surgical History:  Procedure Laterality Date  . arm surgery Right    tendonitis-wrist  . CARPAL TUNNEL RELEASE Left 04/11/2016   Procedure: CARPAL TUNNEL RELEASE;  Surgeon: [name], MD;  Location: [facility];  Service: Orthopedics;  Laterality: Left;  . TUBAL LIGATION      There were no vitals filed for this visit.      Subjective Assessment - 06/25/16 0734    Subjective Patient arrived with no reported pain and has reported improvement since last treatment   Pertinent History anxiety, bipolar, schizophrenia, migraines, heart murmur   Limitations House hold activities   Patient Stated Goals Get out of pain.   Currently in Pain? No/denies                         [facility] Adult PT Treatment/Exercise - 06/25/16 0001      Neck Exercises: Theraband   Scapula Retraction 5 reps   Rows 20 reps;Red   Rows Limitations 10 reps, green     Neck Exercises: Standing   Neck Retraction 10 reps;5 secs   UE D1 Weights (lbs) bilateral hands on weighted ball (4 pounds) x 10 each direction   UE D1 Limitations with head turns   Other Standing Exercises wall angels x 10 holding 3 seconds each, standing  posture against the wall   Other Standing Exercises wall push ups modified x10     Neck Exercises: Seated   Neck Retraction 10 reps;3 secs     Neck Exercises: Supine   Other Supine Exercise Open book pectoralis stretch x 3 holding 30 seconds each.    Other Supine Exercise shoulder flexion with cane x 10                     PT Long Term Goals - 06/25/16 0735      PT LONG TERM GOAL #1   Title Independent with a HEP. (07/18/16)   Time 6   Period Weeks   Status Achieved  06/25/16     PT LONG TERM GOAL #2   Title Increase active cervical rotation to 75 degrees+ so patient can turn head more easily while driving. (06/21/60)   Time 6   Period Weeks   Status Achieved  bil 75 degrees 06/25/16     PT LONG TERM GOAL #3   Title report ability to perform ADLs with pain < 3/10 for improved function (07/18/16)   Time 6   Period Weeks   Status Achieved  patient reported no pain since a few days after last treatment  06/25/16               Plan - 06/25/16 0754    Clinical Impression Statement Patient has met all goals today. Patient feels 100% better overall and would like to DC today. Patient requested short treatment due to overall progress and does not need any more therapy. Patient feels like the exercises helped and she is independent with them at home. Reviewed posture and exercises today. AROM bil cervical rotation 75 degrees.    Rehab Potential Good   PT Frequency 2x / week   PT Duration 6 weeks   PT Treatment/Interventions ADLs/Self Care Home Management;Cryotherapy;Electrical Stimulation;Moist Heat;Traction;Ultrasound;Patient/family education;Therapeutic exercise;Therapeutic activities;Manual techniques;Passive range of motion;Dry needling   PT Next Visit Plan DC per patient    Consulted and Agree with Plan of Care Patient      Patient will benefit from skilled therapeutic intervention in order to improve the following deficits and impairments:  Pain, Decreased  activity tolerance, Decreased range of motion, Postural dysfunction  Visit Diagnosis: Cervicalgia  Abnormal posture     Problem List Patient Active Problem List   Diagnosis Date Noted  . Obesity (BMI 30.0-34.9) 04/23/2016  . Metabolic syndrome 14/12/3011  . Carpal tunnel syndrome, left upper limb   . Essential hypertension, benign 01/16/2016  . Migraine headache 04/18/2015  . Tobacco abuse 02/02/2015  . Constipation 02/02/2015  . Gastroesophageal reflux disease without esophagitis 08/24/2014  . Hyperlipidemia LDL goal <100 09/14/2013  . Bipolar disorder [facility]) 08/25/2013   Ladean Raya, PTA 06/25/16 8:01 AM  [facility] [place], [place], [address] Phone: [phone]   Fax:  [phone]  Name: Stacy Wolf MRN: [id number] Date of Birth: 04-05-1975  PHYSICAL THERAPY DISCHARGE SUMMARY  Visits from Start of Care: 4.  Current functional level related to goals / functional outcomes: See above.   Remaining deficits: All goals met.   Education / Equipment: HEP. Plan: Patient agrees to discharge.  Patient goals were met. Patient is being discharged due to meeting the stated rehab goals.  ?????         Mali Applegate MPT

## 2016-06-28 ENCOUNTER — Encounter: Payer: Medicare Other | Admitting: Physical Therapy

## 2016-07-16 ENCOUNTER — Ambulatory Visit (INDEPENDENT_AMBULATORY_CARE_PROVIDER_SITE_OTHER): Payer: Medicare Other | Admitting: Family Medicine

## 2016-07-16 ENCOUNTER — Encounter: Payer: Self-pay | Admitting: Family Medicine

## 2016-07-16 VITALS — BP 134/76 | HR 82 | Temp 97.4°F | Ht 63.0 in | Wt 195.0 lb

## 2016-07-16 DIAGNOSIS — I10 Essential (primary) hypertension: Secondary | ICD-10-CM

## 2016-07-16 DIAGNOSIS — E785 Hyperlipidemia, unspecified: Secondary | ICD-10-CM | POA: Diagnosis not present

## 2016-07-17 ENCOUNTER — Encounter: Payer: Self-pay | Admitting: Orthopedic Surgery

## 2016-07-17 ENCOUNTER — Ambulatory Visit (INDEPENDENT_AMBULATORY_CARE_PROVIDER_SITE_OTHER): Payer: Medicare Other | Admitting: Orthopedic Surgery

## 2016-07-17 DIAGNOSIS — Z9889 Other specified postprocedural states: Secondary | ICD-10-CM | POA: Diagnosis not present

## 2016-07-17 DIAGNOSIS — Z4889 Encounter for other specified surgical aftercare: Secondary | ICD-10-CM

## 2016-07-17 MED ORDER — IBUPROFEN 800 MG PO TABS: 800.0000 mg | ORAL_TABLET | Freq: Three times a day (TID) | ORAL | 1 refills | Status: DC | PRN

## 2016-07-17 MED ORDER — HYDROCODONE-ACETAMINOPHEN 5-325 MG PO TABS: 1.0000 | ORAL_TABLET | Freq: Three times a day (TID) | ORAL | 0 refills | Status: DC | PRN

## 2016-07-17 NOTE — Progress Notes (Signed)
Patient ID: Stacy Wolf, female   DOB: [birth year], 41 y.o.   MRN: [id number]  Chief Complaint  Patient presents with  . Follow-up    POST OP, LEFT CTR, DOS 04/11/16    41 year old female status post left carpal tunnel release about 3 months ago. She has resolution of her numbness tingling and night pain she still has some incisional tenderness.    Review of Systems  Constitutional: Negative for chills and fever.  Skin: Negative.       There were no vitals taken for this visit. Physical Exam  Constitutional: She is oriented to person, place, and time. She appears well-developed and well-nourished.  Neurological: She is alert and oriented to person, place, and time.  Psychiatric: She has a normal mood and affect.  Vitals reviewed.   Left Hand Exam   Tenderness  The patient is experiencing tenderness in the palmer area.   Range of Motion  The patient has normal left wrist ROM.  Muscle Strength  The patient has normal left wrist strength.  Tests  Phalen's Sign: negative Tinel's Sign (Medial Nerve): negative  Other  Erythema: absent Scars: present Sensation: normal Pulse: present       A/P  Medical decision-making  Encounter Diagnoses  Name Primary?  Marland Kitchen Aftercare following surgery Yes  . History of carpal tunnel surgery of left wrist    Meds ordered this encounter  Medications  . HYDROcodone-acetaminophen (NORCO/VICODIN) 5-325 MG tablet    Sig: Take 1 tablet by mouth every 8 (eight) hours as needed for moderate pain.    Dispense:  30 tablet    Refill:  0  . ibuprofen (ADVIL,MOTRIN) 800 MG tablet    Sig: Take 1 tablet (800 mg total) by mouth every 8 (eight) hours as needed.    Dispense:  90 tablet    Refill:  1   Fu as needed  Arther Abbott, MD 07/17/2016 1:53 PM

## 2016-07-17 NOTE — Patient Instructions (Signed)
Meds ordered this encounter  Medications  . HYDROcodone-acetaminophen (NORCO/VICODIN) 5-325 MG tablet    Sig: Take 1 tablet by mouth every 8 (eight) hours as needed for moderate pain.    Dispense:  30 tablet    Refill:  0  . ibuprofen (ADVIL,MOTRIN) 800 MG tablet    Sig: Take 1 tablet (800 mg total) by mouth every 8 (eight) hours as needed.    Dispense:  90 tablet    Refill:  1

## 2016-07-18 NOTE — Progress Notes (Signed)
BP 134/76   Pulse 82   Temp 97.4 F (36.3 C) (Oral)   Ht '5\' 3"'  (1.6 m)   Wt 195 lb (88.5 kg)   BMI 34.54 kg/m    Subjective:    Patient ID: Stacy Wolf, female    DOB: April 08, 1975, 41 y.o.   MRN: [id number]  HPI: Stacy Wolf is a 41 y.o. female presenting on 07/16/2016 for Hypertension (followup; patient has not been taking Propranolol because her BP was doing so well) and Allergic Rhinitis  (nasal congestion, scratchy eyes and throat)   HPI Hypertension Patient is currently on Diet control, and her blood pressure today is 134/76. Patient denies any lightheadedness or dizziness. Patient denies headaches, blurred vision, chest pains, shortness of breath, or weakness. Denies any side effects from medication and is content with current medication.   Hyperlipidemia Patient is coming in for recheck of his hyperlipidemia. He is currently taking diet controlled. He denies any issues with myalgias or history of liver damage from it. He denies any focal numbness or weakness or chest pain.   Relevant past medical, surgical, family and social history reviewed and updated as indicated. Interim medical history since our last visit reviewed. Allergies and medications reviewed and updated.  Review of Systems  Constitutional: Negative for chills and fever.  Respiratory: Negative for chest tightness and shortness of breath.   Cardiovascular: Negative for chest pain and leg swelling.  Genitourinary: Negative for difficulty urinating and dysuria.  Musculoskeletal: Negative for back pain and gait problem.  Skin: Negative for rash.  Neurological: Negative for dizziness, light-headedness, numbness and headaches.  Psychiatric/Behavioral: Negative for agitation and behavioral problems.  All other systems reviewed and are negative.   Per HPI unless specifically indicated above        Objective:    BP 134/76   Pulse 82   Temp 97.4 F (36.3 C) (Oral)   Ht '5\' 3"'  (1.6 m)   Wt 195 lb  (88.5 kg)   BMI 34.54 kg/m   Wt Readings from Last 3 Encounters:  07/16/16 195 lb (88.5 kg)  05/07/16 192 lb (87.1 kg)  04/26/16 187 lb 6.4 oz (85 kg)    Physical Exam  Constitutional: She is oriented to person, place, and time. She appears well-developed and well-nourished. No distress.  Eyes: Conjunctivae are normal.  Neck: Neck supple. No thyromegaly present.  Cardiovascular: Normal rate, regular rhythm, normal heart sounds and intact distal pulses.   No murmur heard. Pulmonary/Chest: Effort normal and breath sounds normal. No respiratory distress. She has no wheezes.  Musculoskeletal: Normal range of motion. She exhibits no edema or tenderness.  Lymphadenopathy:    She has no cervical adenopathy.  Neurological: She is alert and oriented to person, place, and time. Coordination normal.  Skin: Skin is warm and dry. No rash noted. She is not diaphoretic.  Psychiatric: She has a normal mood and affect. Her behavior is normal.  Nursing note and vitals reviewed.     Assessment & Plan:   Problem List Items Addressed This Visit      Cardiovascular and Mediastinum   Essential hypertension, benign   Relevant Orders   CMP14+EGFR     Other   Hyperlipidemia LDL goal <100 - Primary   Relevant Orders   Lipid panel       Follow up plan: Return in about 6 months (around 01/16/2017), or if symptoms worsen or fail to improve.  Counseling provided for all of the vaccine components Orders Placed This  Encounter  Procedures  . CMP14+EGFR  . Lipid panel    Caryl Pina, MD [facility] 07/16/2016, 4:10 PM

## 2016-08-02 DIAGNOSIS — H04123 Dry eye syndrome of bilateral lacrimal glands: Secondary | ICD-10-CM | POA: Diagnosis not present

## 2016-08-02 DIAGNOSIS — H40033 Anatomical narrow angle, bilateral: Secondary | ICD-10-CM | POA: Diagnosis not present

## 2016-08-09 ENCOUNTER — Encounter: Payer: Self-pay | Admitting: Family Medicine

## 2016-08-09 ENCOUNTER — Ambulatory Visit (INDEPENDENT_AMBULATORY_CARE_PROVIDER_SITE_OTHER): Payer: Medicare Other | Admitting: Family Medicine

## 2016-08-09 VITALS — BP 123/87 | HR 74 | Temp 98.3°F | Ht 63.0 in | Wt 193.0 lb

## 2016-08-09 DIAGNOSIS — N921 Excessive and frequent menstruation with irregular cycle: Secondary | ICD-10-CM | POA: Diagnosis not present

## 2016-08-09 NOTE — Progress Notes (Signed)
BP 123/87   Pulse 74   Temp 98.3 F (36.8 C) (Oral)   Ht 5\' 3"  (1.6 m)   Wt 193 lb (87.5 kg)   BMI 34.19 kg/m    Subjective:    Patient ID: Stacy Wolf, female    DOB: 13-Apr-1975, 41 y.o.   MRN: [id number]  HPI: Stacy Wolf is a 41 y.o. female presenting on 08/09/2016 for Heavy menstrual cycle (No cycle in March or April, cycle began 07/17/16 and still bleeding)   HPI Heavy menstrual bleeding and irregular cycles Patient has been having heavy menstrual bleeding and irregular cycles, this time she is been bleeding since May 15 and has been using 2-3 pads a day and does not seem to be stopping. She did stop bleeding for 2 months after the Depo-Provera was finished in March and April and the beginning of May. She denies any lightheadedness or dizziness or chest pain. She does just feel down and energy because of all of the constant bleeding and having to wear the past. She denies any fevers or chills or vaginal itching or burning. She has an appointment to go see OB/GYN on 08/13/2016 in 4 days. We discussed options and thought it would be best to continue to follow up with them as we have are tried different hormonal options.  Relevant past medical, surgical, family and social history reviewed and updated as indicated. Interim medical history since our last visit reviewed. Allergies and medications reviewed and updated.  Review of Systems  Constitutional: Negative for chills and fever.  Respiratory: Negative for chest tightness and shortness of breath.   Cardiovascular: Negative for chest pain and leg swelling.  Gastrointestinal: Negative for abdominal pain.  Genitourinary: Positive for menstrual problem and vaginal bleeding. Negative for difficulty urinating, dysuria, frequency, pelvic pain, vaginal discharge and vaginal pain.  Musculoskeletal: Negative for back pain and gait problem.  Skin: Negative for rash.  Neurological: Negative for light-headedness and headaches.    Psychiatric/Behavioral: Negative for agitation and behavioral problems.  All other systems reviewed and are negative.   Per HPI unless specifically indicated above        Objective:    BP 123/87   Pulse 74   Temp 98.3 F (36.8 C) (Oral)   Ht 5\' 3"  (1.6 m)   Wt 193 lb (87.5 kg)   BMI 34.19 kg/m   Wt Readings from Last 3 Encounters:  08/09/16 193 lb (87.5 kg)  07/16/16 195 lb (88.5 kg)  05/07/16 192 lb (87.1 kg)    Physical Exam  Constitutional: She is oriented to person, place, and time. She appears well-developed and well-nourished. No distress.  Eyes: Conjunctivae are normal.  Abdominal: Soft. Bowel sounds are normal. She exhibits no distension. There is no tenderness. There is no rebound.  Musculoskeletal: Normal range of motion.  Neurological: She is alert and oriented to person, place, and time. Coordination normal.  Skin: Skin is warm and dry. No rash noted. She is not diaphoretic.  Psychiatric: She has a normal mood and affect. Her behavior is normal.  Nursing note and vitals reviewed.       Assessment & Plan:   Problem List Items Addressed This Visit    None    Visit Diagnoses    Menorrhagia with irregular cycle    -  Primary   Patient has an appointment with her OB/GYN on 08/13/2016 4 days, recommended to consider possible ablation with them       Follow up plan: Return  if symptoms worsen or fail to improve.  Counseling provided for all of the vaccine components No orders of the defined types were placed in this encounter.   Caryl Pina, MD [facility] 08/09/2016, 8:26 AM

## 2016-08-13 ENCOUNTER — Ambulatory Visit (INDEPENDENT_AMBULATORY_CARE_PROVIDER_SITE_OTHER): Payer: Medicare Other | Admitting: Women's Health

## 2016-08-13 ENCOUNTER — Encounter: Payer: Self-pay | Admitting: Women's Health

## 2016-08-13 VITALS — BP 110/80 | HR 78 | Wt 193.4 lb

## 2016-08-13 DIAGNOSIS — N921 Excessive and frequent menstruation with irregular cycle: Secondary | ICD-10-CM

## 2016-08-13 LAB — POCT WET PREP (WET MOUNT)
Clue Cells Wet Prep Whiff POC: POSITIVE
TRICHOMONAS WET PREP HPF POC: ABSENT

## 2016-08-13 LAB — POCT HEMOGLOBIN: HEMOGLOBIN: 14.3 g/dL (ref 12.2–16.2)

## 2016-08-13 MED ORDER — METRONIDAZOLE 500 MG PO TABS
500.0000 mg | ORAL_TABLET | Freq: Two times a day (BID) | ORAL | 0 refills | Status: DC
Start: 2016-08-13 — End: 2016-08-20

## 2016-08-13 NOTE — Patient Instructions (Signed)
Bacterial Vaginosis Bacterial vaginosis is a vaginal infection that occurs when the normal balance of bacteria in the vagina is disrupted. It results from an overgrowth of certain bacteria. This is the most common vaginal infection among women ages 15-44. Because bacterial vaginosis increases your risk for STIs (sexually transmitted infections), getting treated can help reduce your risk for chlamydia, gonorrhea, herpes, and HIV (human immunodeficiency virus). Treatment is also important for preventing complications in pregnant women, because this condition can cause an early (premature) delivery. What are the causes? This condition is caused by an increase in harmful bacteria that are normally present in small amounts in the vagina. However, the reason that the condition develops is not fully understood. What increases the risk? The following factors may make you more likely to develop this condition:  Having a new sexual partner or multiple sexual partners.  Having unprotected sex.  Douching.  Having an intrauterine device (IUD).  Smoking.  Drug and alcohol abuse.  Taking certain antibiotic medicines.  Being pregnant.  You cannot get bacterial vaginosis from toilet seats, bedding, swimming pools, or contact with objects around you. What are the signs or symptoms? Symptoms of this condition include:  Grey or white vaginal discharge. The discharge can also be watery or foamy.  A fish-like odor with discharge, especially after sexual intercourse or during menstruation.  Itching in and around the vagina.  Burning or pain with urination.  Some women with bacterial vaginosis have no signs or symptoms. How is this diagnosed? This condition is diagnosed based on:  Your medical history.  A physical exam of the vagina.  Testing a sample of vaginal fluid under a microscope to look for a large amount of bad bacteria or abnormal cells. Your health care provider may use a cotton swab  or a small wooden spatula to collect the sample.  How is this treated? This condition is treated with antibiotics. These may be given as a pill, a vaginal cream, or a medicine that is put into the vagina (suppository). If the condition comes back after treatment, a second round of antibiotics may be needed. Follow these instructions at home: Medicines  Take over-the-counter and prescription medicines only as told by your health care provider.  Take or use your antibiotic as told by your health care provider. Do not stop taking or using the antibiotic even if you start to feel better. General instructions  If you have a female sexual partner, tell her that you have a vaginal infection. She should see her health care provider and be treated if she has symptoms. If you have a female sexual partner, he does not need treatment.  During treatment: ? Avoid sexual activity until you finish treatment. ? Do not douche. ? Avoid alcohol as directed by your health care provider. ? Avoid breastfeeding as directed by your health care provider.  Drink enough water and fluids to keep your urine clear or pale yellow.  Keep the area around your vagina and rectum clean. ? Wash the area daily with warm water. ? Wipe yourself from front to back after using the toilet.  Keep all follow-up visits as told by your health care provider. This is important. How is this prevented?  Do not douche.  Wash the outside of your vagina with warm water only.  Use protection when having sex. This includes latex condoms and dental dams.  Limit how many sexual partners you have. To help prevent bacterial vaginosis, it is best to have sex with just   one partner (monogamous).  Make sure you and your sexual partner are tested for STIs.  Wear cotton or cotton-lined underwear.  Avoid wearing tight pants and pantyhose, especially during summer.  Limit the amount of alcohol that you drink.  Do not use any products that  contain nicotine or tobacco, such as cigarettes and e-cigarettes. If you need help quitting, ask your health care provider.  Do not use illegal drugs. Where to find more information:  Centers for Disease Control and Prevention: www.cdc.gov/std  American Sexual Health Association (ASHA): www.ashastd.org  U.S. Department of Health and Human Services, Office on Women's Health: www.womenshealth.gov/ or https://www.womenshealth.gov/a-z-topics/bacterial-vaginosis Contact a health care provider if:  Your symptoms do not improve, even after treatment.  You have more discharge or pain when urinating.  You have a fever.  You have pain in your abdomen.  You have pain during sex.  You have vaginal bleeding between periods. Summary  Bacterial vaginosis is a vaginal infection that occurs when the normal balance of bacteria in the vagina is disrupted.  Because bacterial vaginosis increases your risk for STIs (sexually transmitted infections), getting treated can help reduce your risk for chlamydia, gonorrhea, herpes, and HIV (human immunodeficiency virus). Treatment is also important for preventing complications in pregnant women, because the condition can cause an early (premature) delivery.  This condition is treated with antibiotic medicines. These may be given as a pill, a vaginal cream, or a medicine that is put into the vagina (suppository). This information is not intended to replace advice given to you by your health care provider. Make sure you discuss any questions you have with your health care provider. Document Released: 02/19/2005 Document Revised: 11/05/2015 Document Reviewed: 11/05/2015 Elsevier Interactive Patient Education  2017 Elsevier Inc.  

## 2016-08-13 NOTE — Progress Notes (Signed)
   [name]  Patient name: Stacy Wolf MRN [id number]  Date of birth: 12-01-1975  CC & HPI:  Stacy Wolf is a 41 y.o. African American female presenting today for report of period lasting 5/15-6/8, heavy entire time, changed saturated pad q 2hrs, small clots, +cramping.  Regular periods until last June when she began having 2 light periods at beginning and end of month. Had BTL 50yrs ago, wanted periods to stop, so got 1 dose of depo Nov 2017, then had heavy bleeding 11/13- 2/15. Did not get anymore depo. Saw me March 2018 for same. GC/CT both neg. Did not want to do anything differently for periods at that time. Felt like she was just starting to go through menopause. March/April, only had light period x 2-3d. States her older sister and her mom both had hysterectomies for 'cancerous' reasons, but is unsure of what it was.  Patient's last menstrual period was 07/17/2016.  Last pap 01/16/16 w/ pcp, neg  Pertinent History Reviewed:  Medical & Surgical Hx:   Past medical, surgical, family, and social history reviewed in electronic medical record Medications: Reviewed & Updated - see associated section Allergies: Reviewed in electronic medical record  Objective Findings:  Vitals: BP 110/80   Pulse 78   Wt 193 lb 6.4 oz (87.7 kg)   LMP 07/17/2016   BMI 34.26 kg/m  Body mass index is 34.26 kg/m.  Physical Examination: General appearance - alert, well appearing, and in no distress Pelvic - normal external genitalia, vulva, vagina, cervix, uterus and adnexa + malodorous white thin d/c, wet prep +many clues  Results for orders placed or performed in visit on 08/13/16 (from the past 24 hour(s))  POCT hemoglobin   Collection Time: 08/13/16 10:34 AM  Result Value Ref Range   Hemoglobin 14.3 12.2 - 16.2 g/dL  POCT Wet Prep [facility])   Collection Time: 08/13/16 11:26 AM  Result Value Ref Range   Source Wet Prep POC vaginal    WBC, Wet Prep HPF POC none    Bacteria  Wet Prep HPF POC None (A) Few   BACTERIA WET PREP MORPHOLOGY POC     Clue Cells Wet Prep HPF POC Many (A) None   Clue Cells Wet Prep Whiff POC Positive Whiff    Yeast Wet Prep HPF POC None    KOH Wet Prep POC     Trichomonas Wet Prep HPF POC Absent Absent     Assessment & Plan:  A:   Menorrhagia w/ irregular cycle  S/P BTL 50yrs ago  BV  P:  Rx metronidazole 500mg  BID x 7d for BV, no sex or etoh while taking   Discussed getting u/s to evaluate for fibroids, other etiology causing irregular/heavy periods- pt would like to do this. Just came off period, so will get in for u/s asap  Return for this week for pelvic u/s and f/u w/ one of MDs.  Tawnya Crook CNM, [facility] 08/13/2016 11:26 AM

## 2016-08-17 ENCOUNTER — Other Ambulatory Visit: Payer: Self-pay | Admitting: Women's Health

## 2016-08-17 DIAGNOSIS — N921 Excessive and frequent menstruation with irregular cycle: Secondary | ICD-10-CM

## 2016-08-20 ENCOUNTER — Encounter: Payer: Self-pay | Admitting: Obstetrics & Gynecology

## 2016-08-20 ENCOUNTER — Ambulatory Visit (INDEPENDENT_AMBULATORY_CARE_PROVIDER_SITE_OTHER): Payer: Medicare Other

## 2016-08-20 ENCOUNTER — Ambulatory Visit (INDEPENDENT_AMBULATORY_CARE_PROVIDER_SITE_OTHER): Payer: Medicare Other | Admitting: Obstetrics & Gynecology

## 2016-08-20 VITALS — BP 100/70 | HR 72 | Wt 193.0 lb

## 2016-08-20 DIAGNOSIS — D251 Intramural leiomyoma of uterus: Secondary | ICD-10-CM

## 2016-08-20 DIAGNOSIS — N921 Excessive and frequent menstruation with irregular cycle: Secondary | ICD-10-CM

## 2016-08-20 DIAGNOSIS — D250 Submucous leiomyoma of uterus: Secondary | ICD-10-CM

## 2016-08-20 DIAGNOSIS — N946 Dysmenorrhea, unspecified: Secondary | ICD-10-CM

## 2016-08-20 DIAGNOSIS — N840 Polyp of corpus uteri: Secondary | ICD-10-CM

## 2016-08-20 NOTE — Progress Notes (Signed)
Preoperative History and Physical  Stacy Wolf is a 41 y.o. G3P0 with Patient's last menstrual period was 07/17/2016. admitted for a supracervical abdominal hysterectomy with removal of both both fallopian tubes for ovarian cancer prophylaxis.  Patient has a several year history of heavy prolonged vaginal bleeding with her periods which can be quite painful During a workup in our office we performed a sonogram which revealed multiple fibroids couple of a murmur over 6 cm with a uterine volume of 447 cc Additionally there is a 1.7 cm endometrial polyp The ovaries are normal and sonogram The uterine size is 16 weeks' size the patient does desire to preserve her cervix but she no she will have to get Pap smears throughout her life to do that  As result she'll be admitted for a stat abdominal approach supracervical hysterectomy and removal of both fallopian tubes Lanolin preserving her ovaries unless there are some unforeseen problem  PMH:    Past Medical History:  Diagnosis Date  . Anxiety   . Bipolar 1 disorder ([place])   . Heart murmur   . Migraines   . Schizophrenia ([place])     PSH:     Past Surgical History:  Procedure Laterality Date  . arm surgery Right    tendonitis-wrist  . CARPAL TUNNEL RELEASE Left 04/11/2016   Procedure: CARPAL TUNNEL RELEASE;  Surgeon: [name], MD;  Location: [facility];  Service: Orthopedics;  Laterality: Left;  . TUBAL LIGATION      POb/GynH:      OB History    Gravida Para Term Preterm AB Living   3             SAB TAB Ectopic Multiple Live Births                  SH:   Social History  Substance Use Topics  . Smoking status: Current Every Day Smoker    Packs/day: 0.50    Years: 7.00    Types: Cigarettes  . Smokeless tobacco: Never Used  . Alcohol use No    FH:    Family History  Problem Relation Age of Onset  . Cancer Mother 21       breast CA at 10 and uterine CA at 28  . Hypertension Mother   . Kidney disease Mother         kidney transplant  . Cancer Sister 60       uterine  . Cancer Sister 47       uterine  . Diabetes Father   . Liver disease Father   . Cancer Maternal Aunt        breast     Allergies:  Allergies  Allergen Reactions  . Latuda [Lurasidone Hcl]     Suicidal thoughts   . Wellbutrin [Bupropion] Other (See Comments)    Seizures / lowered seizure threshold  . Zoloft [Sertraline Hcl] Hives  . Haloperidol And Related     Leg cramps  . Imitrex [Sumatriptan]     Could not swallow after taking med  . Nicoderm [Nicotine] Dermatitis    Medications:       Current Outpatient Prescriptions:  .  ibuprofen (ADVIL,MOTRIN) 800 MG tablet, Take 1 tablet (800 mg total) by mouth every 8 (eight) hours as needed. (Patient not taking: Reported on 08/20/2016), Disp: 90 tablet, Rfl: 1  Review of Systems:   Review of Systems  Constitutional: Negative for fever, chills, weight loss, malaise/fatigue and diaphoresis.  HENT: Negative  for hearing loss, ear pain, nosebleeds, congestion, sore throat, neck pain, tinnitus and ear discharge.   Eyes: Negative for blurred vision, double vision, photophobia, pain, discharge and redness.  Respiratory: Negative for cough, hemoptysis, sputum production, shortness of breath, wheezing and stridor.   Cardiovascular: Negative for chest pain, palpitations, orthopnea, claudication, leg swelling and PND.  Gastrointestinal: Positive for abdominal pain. Negative for heartburn, nausea, vomiting, diarrhea, constipation, blood in stool and melena.  Genitourinary: Negative for dysuria, urgency, frequency, hematuria and flank pain.  Musculoskeletal: Negative for myalgias, back pain, joint pain and falls.  Skin: Negative for itching and rash.  Neurological: Negative for dizziness, tingling, tremors, sensory change, speech change, focal weakness, seizures, loss of consciousness, weakness and headaches.  Endo/Heme/Allergies: Negative for environmental allergies and polydipsia. Does not  bruise/bleed easily.  Psychiatric/Behavioral: Negative for depression, suicidal ideas, hallucinations, memory loss and substance abuse. The patient is not nervous/anxious and does not have insomnia.      PHYSICAL EXAM:  Blood pressure 100/70, pulse 72, weight 193 lb (87.5 kg), last menstrual period 07/17/2016.    Vitals reviewed. Constitutional: She is oriented to person, place, and time. She appears well-developed and well-nourished.  HENT:  Head: Normocephalic and atraumatic.  Right Ear: External ear normal.  Left Ear: External ear normal.  Nose: Nose normal.  Mouth/Throat: Oropharynx is clear and moist.  Eyes: Conjunctivae and EOM are normal. Pupils are equal, round, and reactive to light. Right eye exhibits no discharge. Left eye exhibits no discharge. No scleral icterus.  Neck: Normal range of motion. Neck supple. No tracheal deviation present. No thyromegaly present.  Cardiovascular: Normal rate, regular rhythm, normal heart sounds and intact distal pulses.  Exam reveals no gallop and no friction rub.   No murmur heard. Respiratory: Effort normal and breath sounds normal. No respiratory distress. She has no wheezes. She has no rales. She exhibits no tenderness.  GI: Soft. Bowel sounds are normal. She exhibits no distension and no mass. There is tenderness. There is no rebound and no guarding.  Genitourinary:       Vulva is normal without lesions Vagina is pink moist without discharge Cervix normal in appearance and pap is normal in 01/2016 Uterus is 16 week size multiple fibroids palpable  Adnexa is negative with normal sized ovaries by sonogram  Musculoskeletal: Normal range of motion. She exhibits no edema and no tenderness.  Neurological: She is alert and oriented to person, place, and time. She has normal reflexes. She displays normal reflexes. No cranial nerve deficit. She exhibits normal muscle tone. Coordination normal.  Skin: Skin is warm and dry. No rash noted. No  erythema. No pallor.  Psychiatric: She has a normal mood and affect. Her behavior is normal. Judgment and thought content normal.    Labs: Results for orders placed or performed in visit on 08/13/16 (from the past 336 hour(s))  POCT hemoglobin   Collection Time: 08/13/16 10:34 AM  Result Value Ref Range   Hemoglobin 14.3 12.2 - 16.2 g/dL  POCT Wet Prep [facility] [place])   Collection Time: 08/13/16 11:26 AM  Result Value Ref Range   Source Wet Prep POC vaginal    WBC, Wet Prep HPF POC none    Bacteria Wet Prep HPF POC None (A) Few   BACTERIA WET PREP MORPHOLOGY POC     Clue Cells Wet Prep HPF POC Many (A) None   Clue Cells Wet Prep Whiff POC Positive Whiff    Yeast Wet Prep HPF POC None  KOH Wet Prep POC     Trichomonas Wet Prep HPF POC Absent Absent    EKG: Orders placed or performed during the hospital encounter of 04/06/16  . EKG 12-Lead  . EKG 12-Lead    Imaging Studies: US Ob Transvaginal  Result Date: 08/20/2016 GYNECOLOGIC SONOGRAM LAMONA EIMER is a 41 y.o. LMP 07/17/2016 for a pelvic sonogram for heavy irregular periods. Uterus                      12.9 x 8.3 x 8.1 cm, enlarged heterogeneous anteverted uterus w/mult fibroids Endometrium          12.5 mm, symmetrical, 1.4 x .7 x 1.2 cm echogenic mass w/in the endometrium w/ color flow Right ovary             3.3 x 2.1 x 3.2 cm, wnl Left ovary                3.4 x 2.2 x 3.3 cm, simple left ovarian cyst 2.5 x 2.2 x 2 cm No free fluid Fibroids:  (#1) anterior left subserosal fibroid 6 x 6.4 x 5.1 cm,(#2) fundal subserosal fibroid 4.6 x 3.8 x 3.7 cm                 Technician Comments: PELVIC US TA/TV: enlarged heterogeneous anteverted uterus w/mult fibroids,(#1) anterior left subserosal fibroid 6 x 6.4 x 5.1 cm,(#2) fundal subserosal fibroid 4.6 x 3.8 x 3.7 cm,EEC 12.8 mm,1.4 x .7 x 1.2 cm echogenic mass w/in the endometrium w/ color flow,normal right ovary,simple left ovarian cyst 2.5 x 2.2 x 2 cm,no free fluid,no pain during  ultrasound,ovaries appear mobile [employer] 08/20/2016 11:10 AM Clinical Impression and recommendations: I have reviewed the sonogram results above, combined with the patient's current clinical course, below are my impressions and any appropriate recommendations for management based on the sonographic findings. Uterus is enlarged with volume 447 cc, multiple fibroids, noted Endometrial polyp is noted Both ovaries are normal Alisea Matte H 08/20/2016 11:49 AM   US Pelvis Complete  Result Date: 08/20/2016 GYNECOLOGIC SONOGRAM MAROLYN URSCHEL is a 41 y.o. LMP 07/17/2016 for a pelvic sonogram for heavy irregular periods. Uterus                      12.9 x 8.3 x 8.1 cm, enlarged heterogeneous anteverted uterus w/mult fibroids Endometrium          12.5 mm, symmetrical, 1.4 x .7 x 1.2 cm echogenic mass w/in the endometrium w/ color flow Right ovary             3.3 x 2.1 x 3.2 cm, wnl Left ovary                3.4 x 2.2 x 3.3 cm, simple left ovarian cyst 2.5 x 2.2 x 2 cm No free fluid Fibroids:  (#1) anterior left subserosal fibroid 6 x 6.4 x 5.1 cm,(#2) fundal subserosal fibroid 4.6 x 3.8 x 3.7 cm                 Technician Comments: PELVIC US TA/TV: enlarged heterogeneous anteverted uterus w/mult fibroids,(#1) anterior left subserosal fibroid 6 x 6.4 x 5.1 cm,(#2) fundal subserosal fibroid 4.6 x 3.8 x 3.7 cm,EEC 12.8 mm,1.4 x .7 x 1.2 cm echogenic mass w/in the endometrium w/ color flow,normal right ovary,simple left ovarian cyst 2.5 x 2.2 x 2 cm,no free fluid,no pain during ultrasound,ovaries appear mobile [employer]  08/20/2016 11:10 AM Clinical Impression and recommendations: I have reviewed the sonogram results above, combined with the patient's current clinical course, below are my impressions and any appropriate recommendations for management based on the sonographic findings. Uterus is enlarged with volume 447 cc, multiple fibroids, noted Endometrial polyp is noted Both ovaries are normal Blane Worthington H 08/20/2016  11:49 AM      Assessment: Enlarged fibroid uterus, multiple fibroids, 16 weeks size Endometrial polyp Menometrorrhagia Dysmenorrhea  Patient Active Problem List   Diagnosis Date Noted  . Menorrhagia with irregular cycle 08/13/2016  . Obesity (BMI 30.0-34.9) 04/23/2016  . Metabolic syndrome 76/81/1572  . Carpal tunnel syndrome, left upper limb   . Essential hypertension, benign 01/16/2016  . Migraine headache 04/18/2015  . Tobacco abuse 02/02/2015  . Constipation 02/02/2015  . Gastroesophageal reflux disease without esophagitis 08/24/2014  . Hyperlipidemia LDL goal <100 09/14/2013  . Bipolar disorder ([place]) 08/25/2013    Plan: Abdominal approach supracervical hysterectomy with removal of both tubes reservation of her ovaries  Pt understands the risks of surgery including but not limited t  excessive bleeding requiring transfusion or reoperation, post-operative infection requiring prolonged hospitalization or re-hospitalization and antibiotic therapy, and damage to other organs including bladder, bowel, ureters and major vessels.  The patient also understands the alternative treatment options which were discussed in full.  All questions were answered.  Montrice Montuori H 08/20/2016 11:58 AM     Rizwan Kuyper H 08/20/2016 11:55 AM    Face to face time:  25 minutes  Greater than 50% of the visit time was spent in counseling and coordination of care with the patient.  The summary and outline of the counseling and care coordination is summarized in the note above.   All questions were answered.

## 2016-08-20 NOTE — Progress Notes (Signed)
PELVIC US TA/TV: enlarged heterogeneous anteverted uterus w/mult fibroids,(#1) anterior left subserosal fibroid 6 x 6.4 x 5.1 cm,(#2) fundal subserosal fibroid 4.6 x 3.8 x 3.7 cm,EEC 12.8 mm,1.4 x .7 x 1.2 cm echogenic mass w/in the endometrium w/ color flow,normal right ovary,simple left ovarian cyst 2.5 x 2.2 x 2 cm,no free fluid,no pain during ultrasound,ovaries appear mobile

## 2016-08-21 NOTE — Patient Instructions (Signed)
Stacy Wolf  08/21/2016     @PREFPERIOPPHARMACY @   Your procedure is scheduled on 08/29/2016.  Report to [facility] at 8:40 A.M.  Call this number if you have problems the morning of surgery:  [phone]   Remember:  Do not eat food or drink liquids after midnight.  Take these medicines the morning of surgery with A SIP OF WATER None   Do not wear jewelry, make-up or nail polish.  Do not wear lotions, powders, or perfumes, or deoderant.  Do not shave 48 hours prior to surgery.  Men may shave face and neck.  Do not bring valuables to the hospital.  [facility] is not responsible for any belongings or valuables.  Contacts, dentures or bridgework may not be worn into surgery.  Leave your suitcase in the car.  After surgery it may be brought to your room.  For patients admitted to the hospital, discharge time will be determined by your treatment team.  Patients discharged the day of surgery will not be allowed to drive home.    Please read over the following fact sheets that you were given. Surgical Site Infection Prevention and Anesthesia Post-op Instructions     PATIENT INSTRUCTIONS POST-ANESTHESIA  IMMEDIATELY FOLLOWING SURGERY:  Do not drive or operate machinery for the first twenty four hours after surgery.  Do not make any important decisions for twenty four hours after surgery or while taking narcotic pain medications or sedatives.  If you develop intractable nausea and vomiting or a severe headache please notify your doctor immediately.  FOLLOW-UP:  Please make an appointment with your surgeon as instructed. You do not need to follow up with anesthesia unless specifically instructed to do so.  WOUND CARE INSTRUCTIONS (if applicable):  Keep a dry clean dressing on the anesthesia/puncture wound site if there is drainage.  Once the wound has quit draining you may leave it open to air.  Generally you should leave the bandage intact for twenty four hours unless there is  drainage.  If the epidural site drains for more than 36-48 hours please call the anesthesia department.  QUESTIONS?:  Please feel free to call your physician or the hospital operator if you have any questions, and they will be happy to assist you.      Salpingectomy Salpingectomy, also called tubectomy, is the surgical removal of one of the fallopian tubes. The fallopian tubes are where eggs travel from the ovaries to the uterus. Removing one fallopian tube does not prevent you from becoming pregnant. It also does not cause problems with your menstrual periods. You may need a salpingectomy if you:  Have a fertilized egg that attaches to the fallopian tube (ectopic pregnancy), especially one that causes the tube to burst or tear (rupture).  Have an infected fallopian tube.  Have cancer of the fallopian tube or nearby organs.  Have had an ovary removed due to a cyst or tumor.  Have had your uterus removed.  There are three different methods that can be used for a salpingectomy:  Open. This method involves making one large incision in your abdomen.  Laparoscopic. This method involves using a thin, lighted tube with a tiny camera on the end (laparoscope) to help perform the procedure. The laparoscope will allow your surgeon to make several small incisions in the abdomen instead of a large incision.  Robot-assisted: This method involves using a computer to control surgical instruments that are attached to robotic arms.  Tell a health care provider  about:  Any allergies you have.  All medicines you are taking, including vitamins, herbs, eye drops, creams, and over-the-counter medicines.  Any problems you or family members have had with anesthetic medicines.  Any blood disorders you have.  Any surgeries you have had.  Any medical conditions you have.  Whether you are pregnant or may be pregnant. What are the risks? Generally, this is a safe procedure. However, problems may occur,  including:  Infection.  Bleeding.  Allergic reactions to medicines.  Damage to other structures or organs.  Blood clots in the legs or lungs.  What happens before the procedure? Staying hydrated Follow instructions from your health care provider about hydration, which may include:  Up to 2 hours before the procedure - you may continue to drink clear liquids, such as water, clear fruit juice, black coffee, and plain tea.  Eating and drinking restrictions Follow instructions from your health care provider about eating and drinking, which may include:  8 hours before the procedure - stop eating heavy meals or foods such as meat, fried foods, or fatty foods.  6 hours before the procedure - stop eating light meals or foods, such as toast or cereal.  6 hours before the procedure - stop drinking milk or drinks that contain milk.  2 hours before the procedure - stop drinking clear liquids.  Medicines  Ask your health care provider about: ? Changing or stopping your regular medicines. This is especially important if you are taking diabetes medicines or blood thinners. ? Taking medicines such as aspirin and ibuprofen. These medicines can thin your blood. Do not take these medicines before your procedure if your health care provider instructs you not to.  You may be given antibiotic medicine to help prevent infection. General instructions  Do not smoke for at least 2 weeks before your procedure. If you need help quitting, ask your health care provider.  You may have an exam or tests, such as an electrocardiogram (ECG).  You may have a blood or urine sample taken.  Ask your health care provider: ? Whether you should stop removing hair from your surgical area. ? How your surgical site will be marked or identified.  You may be asked to shower with a germ-killing soap.  Plan to have someone take you home from the hospital or clinic.  If you will be going home right after the  procedure, plan to have someone with you for 24 hours. What happens during the procedure?  To reduce your risk of infection: ? Your health care team will wash or sanitize their hands. ? Hair may be removed from the surgical area. ? Your skin will be washed with soap.  An IV tube will be inserted into one of your veins.  You will be given a medicine to make you fall asleep (general anesthetic). You may also be given a medicine to help you relax (sedative).  A thin tube (catheter) may be inserted through your urethra and into your bladder to drain urine during your procedure.  Depending on the type of procedure you are having, one incision or several small incisions will be made in your abdomen.  Your fallopian tube will be cut and removed from where it attaches to your uterus.  Your blood vessels will be clamped and tied to prevent excess bleeding.  The incision(s) in your abdomen will be closed with stitches (sutures), staples, or skin glue.  A bandage (dressing) may be placed over your incision(s). The procedure may  vary among health care providers and hospitals. What happens after the procedure?  Your blood pressure, heart rate, breathing rate, and blood oxygen level will be monitored until the medicines you were given have worn off.  You may continue to receive fluids and medicines through an IV tube.  You may continue to have a catheter draining your urine.  You may have to wear compression stockings. These stockings help to prevent blood clots and reduce swelling in your legs.  You will be given pain medicine as needed.  Do not drive for 24 hours if you received a sedative. Summary  Salpingectomy is a surgical procedure to remove one of the fallopian tubes.  The procedure may be done with an open incision, with a laparoscope, or with computer-controlled instruments.  Depending on the type of procedure you are having, one incision or several small incisions will be made  in your abdomen.  Your blood pressure, heart rate, breathing rate, and blood oxygen level will be monitored until the medicines you were given have worn off.  Plan to have someone take you home from the hospital or clinic. This information is not intended to replace advice given to you by your health care provider. Make sure you discuss any questions you have with your health care provider. Document Released: 07/08/2008 Document Revised: 10/07/2015 Document Reviewed: 08/13/2012 Elsevier Interactive Patient Education  2018 [place] Hysterectomy A supracervical hysterectomy is surgery to remove the top part of the uterus, but not the cervix. You will no longer have menstrual periods or be able to get pregnant after this surgery. The fallopian tubes and ovaries may also be removed (bilateral salpingo-oophorectomy) during this surgery. This surgery is usually performed using a minimally invasive technique called laparoscopy. This technique allows the surgery to be done through small incisions. The minimally invasive technique provides benefits such as less pain, less risk of infection, and shorter recovery time. Tell a health care provider about:  Any allergies you have.  All medicines you are taking, including vitamins, herbs, eye drops, creams, and over-the-counter medicines.  Any problems you or family members have had with anesthetic medicines.  Any blood disorders you have.  Any surgeries you have had.  Any medical conditions you have. What are the risks? Generally, this is a safe procedure. However, as with any procedure, complications can occur. Possible complications include:  Bleeding.  Blood clots in the legs or lung.  Infection.  Injury to surrounding organs.  Problems related to anesthesia.  Conversion to an open abdominal surgery.  Additional surgery later to remove the cervix if you have problems with the cervix.  What happens before the  procedure?  Ask your health care provider about changing or stopping your regular medicines.  Do not take aspirin or blood thinners (anticoagulants) for 1 week before the surgery, or as directed by your health care provider.  Do not eat or drink anything for 8 hours before the surgery, or as directed by your health care provider.  Quit smoking if you smoke.  Arrange for a ride home after surgery and for someone to help you at home during recovery. What happens during the procedure?  You will be given an antibiotic medicine.  An IV tube will be placed in one of your veins. You will be given medicine to make you sleep (general anesthetic).  A gas (carbon dioxide) will be used to inflate your abdomen. This will allow your surgeon to look inside your abdomen, perform your surgery, and  treat any other problems found if necessary.  Three or four small incisions will be made in your abdomen. One of these incisions will be made in the area of your belly button (navel). A thin, flexible tube with a tiny camera and light on the end of it (laparoscope) will be inserted into the incision. The camera on the laparoscope sends a picture to a TV screen in the operating room. This gives your surgeon a good view inside the abdomen.  Other surgical instruments will be inserted through the other incisions.  The uterus will be cut into small pieces and removed through the small incisions.  Your incisions will be closed. What happens after the procedure?  You will be taken to a recovery area where your progress will be monitored until you are awake, stable, and taking fluids well. If there are no other problems, you will then be moved to a regular hospital room, or you will be allowed to go home.  You will likely have minimal discomfort after the surgery because the incisions are so small with the laparoscopic technique.  You will be given pain medicine while you are in the hospital and for when you go  home.  If a bilateral salpingo-oophorectomy was performed before menopause, you will go through a sudden (abrupt) menopause. This can be helped with hormone medicines. This information is not intended to replace advice given to you by your health care provider. Make sure you discuss any questions you have with your health care provider. Document Released: 08/08/2007 Document Revised: 07/28/2015 Document Reviewed: 08/22/2012 Elsevier Interactive Patient Education  [employer].

## 2016-08-23 ENCOUNTER — Encounter (HOSPITAL_COMMUNITY)
Admission: RE | Admit: 2016-08-23 | Discharge: 2016-08-23 | Disposition: A | Discharge status: Home / Self Care | Payer: Medicare Other | Source: Ambulatory Visit | Attending: Obstetrics & Gynecology | Admitting: Obstetrics & Gynecology

## 2016-08-23 ENCOUNTER — Encounter (HOSPITAL_COMMUNITY): Payer: Self-pay

## 2016-08-23 DIAGNOSIS — K219 Gastro-esophageal reflux disease without esophagitis: Secondary | ICD-10-CM | POA: Insufficient documentation

## 2016-08-23 DIAGNOSIS — D250 Submucous leiomyoma of uterus: Secondary | ICD-10-CM | POA: Insufficient documentation

## 2016-08-23 DIAGNOSIS — E8881 Metabolic syndrome: Secondary | ICD-10-CM | POA: Diagnosis not present

## 2016-08-23 DIAGNOSIS — D251 Intramural leiomyoma of uterus: Secondary | ICD-10-CM | POA: Insufficient documentation

## 2016-08-23 DIAGNOSIS — N840 Polyp of corpus uteri: Secondary | ICD-10-CM | POA: Diagnosis not present

## 2016-08-23 DIAGNOSIS — N946 Dysmenorrhea, unspecified: Secondary | ICD-10-CM | POA: Diagnosis not present

## 2016-08-23 DIAGNOSIS — N921 Excessive and frequent menstruation with irregular cycle: Secondary | ICD-10-CM | POA: Diagnosis not present

## 2016-08-23 DIAGNOSIS — I10 Essential (primary) hypertension: Secondary | ICD-10-CM | POA: Diagnosis not present

## 2016-08-23 DIAGNOSIS — E785 Hyperlipidemia, unspecified: Secondary | ICD-10-CM | POA: Diagnosis not present

## 2016-08-23 DIAGNOSIS — Z720 Tobacco use: Secondary | ICD-10-CM | POA: Diagnosis not present

## 2016-08-23 DIAGNOSIS — Z79899 Other long term (current) drug therapy: Secondary | ICD-10-CM | POA: Insufficient documentation

## 2016-08-23 DIAGNOSIS — F3170 Bipolar disorder, currently in remission, most recent episode unspecified: Secondary | ICD-10-CM | POA: Insufficient documentation

## 2016-08-23 DIAGNOSIS — Z01818 Encounter for other preprocedural examination: Secondary | ICD-10-CM | POA: Insufficient documentation

## 2016-08-23 LAB — TYPE AND SCREEN
ABO/RH(D): O POS
Antibody Screen: NEGATIVE

## 2016-08-23 LAB — COMPREHENSIVE METABOLIC PANEL
ALBUMIN: 4 g/dL (ref 3.5–5.0)
ALK PHOS: 73 U/L (ref 38–126)
ALT: 11 U/L — AB (ref 14–54)
ANION GAP: 8 (ref 5–15)
AST: 17 U/L (ref 15–41)
BILIRUBIN TOTAL: 0.3 mg/dL (ref 0.3–1.2)
BUN: 10 mg/dL (ref 6–20)
CALCIUM: 9.1 mg/dL (ref 8.9–10.3)
CO2: 24 mmol/L (ref 22–32)
CREATININE: 0.92 mg/dL (ref 0.44–1.00)
Chloride: 107 mmol/L (ref 101–111)
GFR calc Af Amer: >60 mL/min (ref >60)
GFR calc non Af Amer: >60 mL/min (ref >60)
Glucose, Bld: 117 mg/dL — ABNORMAL HIGH (ref 65–99)
Potassium: 3.6 mmol/L (ref 3.5–5.1)
Sodium: 139 mmol/L (ref 135–145)
TOTAL PROTEIN: 7 g/dL (ref 6.5–8.1)

## 2016-08-23 LAB — URINALYSIS, ROUTINE W REFLEX MICROSCOPIC
BILIRUBIN URINE: NEGATIVE
GLUCOSE, UA: NEGATIVE mg/dL
Ketones, ur: NEGATIVE mg/dL
LEUKOCYTES UA: NEGATIVE
NITRITE: NEGATIVE
PH: 5 (ref 5.0–8.0)
Protein, ur: NEGATIVE mg/dL
SPECIFIC GRAVITY, URINE: 1.019 (ref 1.005–1.030)

## 2016-08-23 LAB — SURGICAL PCR SCREEN
MRSA, PCR: NEGATIVE
STAPHYLOCOCCUS AUREUS: NEGATIVE

## 2016-08-23 LAB — RAPID HIV SCREEN (HIV 1/2 AB+AG)
HIV 1/2 ANTIBODIES: NONREACTIVE
HIV-1 P24 Antigen - HIV24: NONREACTIVE

## 2016-08-23 LAB — HCG, QUANTITATIVE, PREGNANCY: hCG, Beta Chain, Quant, S: <1 m[IU]/mL (ref <5)

## 2016-08-23 LAB — CBC
HEMATOCRIT: 41.1 % (ref 36.0–46.0)
HEMOGLOBIN: 13.5 g/dL (ref 12.0–15.0)
MCH: 30.1 pg (ref 26.0–34.0)
MCHC: 32.8 g/dL (ref 30.0–36.0)
MCV: 91.5 fL (ref 78.0–100.0)
Platelets: 261 10*3/uL (ref 150–400)
RBC: 4.49 MIL/uL (ref 3.87–5.11)
RDW: 14.3 % (ref 11.5–15.5)
WBC: 6.6 10*3/uL (ref 4.0–10.5)

## 2016-08-29 ENCOUNTER — Encounter (HOSPITAL_COMMUNITY)
Admission: RE | Disposition: A | Discharge status: Home / Self Care | Payer: Self-pay | Source: Ambulatory Visit | Attending: Obstetrics & Gynecology

## 2016-08-29 ENCOUNTER — Inpatient Hospital Stay (HOSPITAL_COMMUNITY): Payer: Medicare Other | Admitting: Anesthesiology

## 2016-08-29 ENCOUNTER — Inpatient Hospital Stay (HOSPITAL_COMMUNITY)
Admission: RE | Admit: 2016-08-29 | Discharge: 2016-08-30 | DRG: 743 | Disposition: A | Discharge status: Home / Self Care | Payer: Medicare Other | Source: Ambulatory Visit | Attending: Obstetrics & Gynecology | Admitting: Obstetrics & Gynecology

## 2016-08-29 ENCOUNTER — Encounter (HOSPITAL_COMMUNITY): Payer: Self-pay

## 2016-08-29 DIAGNOSIS — I10 Essential (primary) hypertension: Secondary | ICD-10-CM | POA: Diagnosis present

## 2016-08-29 DIAGNOSIS — K219 Gastro-esophageal reflux disease without esophagitis: Secondary | ICD-10-CM | POA: Diagnosis present

## 2016-08-29 DIAGNOSIS — F209 Schizophrenia, unspecified: Secondary | ICD-10-CM | POA: Diagnosis present

## 2016-08-29 DIAGNOSIS — F419 Anxiety disorder, unspecified: Secondary | ICD-10-CM | POA: Diagnosis present

## 2016-08-29 DIAGNOSIS — F17210 Nicotine dependence, cigarettes, uncomplicated: Secondary | ICD-10-CM | POA: Diagnosis present

## 2016-08-29 DIAGNOSIS — N921 Excessive and frequent menstruation with irregular cycle: Secondary | ICD-10-CM | POA: Diagnosis present

## 2016-08-29 DIAGNOSIS — N839 Noninflammatory disorder of ovary, fallopian tube and broad ligament, unspecified: Secondary | ICD-10-CM | POA: Diagnosis not present

## 2016-08-29 DIAGNOSIS — N840 Polyp of corpus uteri: Secondary | ICD-10-CM | POA: Diagnosis not present

## 2016-08-29 DIAGNOSIS — Z8249 Family history of ischemic heart disease and other diseases of the circulatory system: Secondary | ICD-10-CM

## 2016-08-29 DIAGNOSIS — Z803 Family history of malignant neoplasm of breast: Secondary | ICD-10-CM

## 2016-08-29 DIAGNOSIS — N329 Bladder disorder, unspecified: Secondary | ICD-10-CM | POA: Diagnosis not present

## 2016-08-29 DIAGNOSIS — Z9889 Other specified postprocedural states: Secondary | ICD-10-CM | POA: Diagnosis present

## 2016-08-29 DIAGNOSIS — Z888 Allergy status to other drugs, medicaments and biological substances status: Secondary | ICD-10-CM | POA: Diagnosis not present

## 2016-08-29 DIAGNOSIS — R011 Cardiac murmur, unspecified: Secondary | ICD-10-CM | POA: Diagnosis present

## 2016-08-29 DIAGNOSIS — E669 Obesity, unspecified: Secondary | ICD-10-CM | POA: Diagnosis present

## 2016-08-29 DIAGNOSIS — N879 Dysplasia of cervix uteri, unspecified: Secondary | ICD-10-CM | POA: Diagnosis not present

## 2016-08-29 DIAGNOSIS — N888 Other specified noninflammatory disorders of cervix uteri: Secondary | ICD-10-CM | POA: Diagnosis not present

## 2016-08-29 DIAGNOSIS — F319 Bipolar disorder, unspecified: Secondary | ICD-10-CM | POA: Diagnosis present

## 2016-08-29 DIAGNOSIS — Z6834 Body mass index (BMI) 34.0-34.9, adult: Secondary | ICD-10-CM

## 2016-08-29 DIAGNOSIS — N946 Dysmenorrhea, unspecified: Secondary | ICD-10-CM | POA: Diagnosis present

## 2016-08-29 DIAGNOSIS — Z90711 Acquired absence of uterus with remaining cervical stump: Secondary | ICD-10-CM

## 2016-08-29 DIAGNOSIS — F3170 Bipolar disorder, currently in remission, most recent episode unspecified: Secondary | ICD-10-CM

## 2016-08-29 DIAGNOSIS — N838 Other noninflammatory disorders of ovary, fallopian tube and broad ligament: Secondary | ICD-10-CM | POA: Diagnosis not present

## 2016-08-29 DIAGNOSIS — D259 Leiomyoma of uterus, unspecified: Principal | ICD-10-CM | POA: Diagnosis present

## 2016-08-29 DIAGNOSIS — Z8049 Family history of malignant neoplasm of other genital organs: Secondary | ICD-10-CM | POA: Diagnosis not present

## 2016-08-29 HISTORY — PX: BILATERAL SALPINGECTOMY: SHX5743

## 2016-08-29 HISTORY — PX: SUPRACERVICAL ABDOMINAL HYSTERECTOMY: SHX5393

## 2016-08-29 SURGERY — HYSTERECTOMY, SUPRACERVICAL, ABDOMINAL
Anesthesia: General

## 2016-08-29 MED ORDER — ROCURONIUM BROMIDE 100 MG/10ML IV SOLN
INTRAVENOUS | Status: DC | PRN
  Administered 2016-08-29: 10 mg via INTRAVENOUS
  Administered 2016-08-29: 50 mg via INTRAVENOUS

## 2016-08-29 MED ORDER — BUPIVACAINE LIPOSOME 1.3 % IJ SUSP
INTRAMUSCULAR | Status: DC | PRN
  Administered 2016-08-29: 20 mL

## 2016-08-29 MED ORDER — SODIUM CHLORIDE 0.9 % IV SOLN
8.0000 mg | Freq: Four times a day (QID) | INTRAVENOUS | Status: DC | PRN
  Administered 2016-08-29: 8 mg via INTRAVENOUS
  Filled 2016-08-29: qty 4

## 2016-08-29 MED ORDER — DOCUSATE SODIUM 100 MG PO CAPS
100.0000 mg | ORAL_CAPSULE | Freq: Two times a day (BID) | ORAL | Status: DC
  Administered 2016-08-29 (×2): 100 mg via ORAL
  Filled 2016-08-29 (×2): qty 1

## 2016-08-29 MED ORDER — LACTATED RINGERS IV SOLN
INTRAVENOUS | Status: DC
  Administered 2016-08-29 (×3): via INTRAVENOUS

## 2016-08-29 MED ORDER — OXYCODONE-ACETAMINOPHEN 5-325 MG PO TABS
1.0000 | ORAL_TABLET | ORAL | Status: DC | PRN
  Administered 2016-08-29 – 2016-08-30 (×4): 2 via ORAL
  Filled 2016-08-29 (×4): qty 2

## 2016-08-29 MED ORDER — PROPOFOL 10 MG/ML IV BOLUS
INTRAVENOUS | Status: DC | PRN
  Administered 2016-08-29: 200 mg via INTRAVENOUS

## 2016-08-29 MED ORDER — DEXAMETHASONE SODIUM PHOSPHATE 4 MG/ML IJ SOLN
INTRAMUSCULAR | Status: DC | PRN
  Administered 2016-08-29: 4 mg via INTRAVENOUS

## 2016-08-29 MED ORDER — CEFAZOLIN SODIUM-DEXTROSE 2-4 GM/100ML-% IV SOLN
2.0000 g | INTRAVENOUS | Status: AC
  Administered 2016-08-29: 2 g via INTRAVENOUS
  Filled 2016-08-29: qty 100

## 2016-08-29 MED ORDER — HEMOSTATIC AGENTS (NO CHARGE) OPTIME
TOPICAL | Status: DC | PRN
  Administered 2016-08-29: 1 via TOPICAL

## 2016-08-29 MED ORDER — ONDANSETRON HCL 4 MG/2ML IJ SOLN
INTRAMUSCULAR | Status: AC
  Filled 2016-08-29: qty 2

## 2016-08-29 MED ORDER — 0.9 % SODIUM CHLORIDE (POUR BTL) OPTIME
TOPICAL | Status: DC | PRN
  Administered 2016-08-29: 3000 mL

## 2016-08-29 MED ORDER — ZOLPIDEM TARTRATE 5 MG PO TABS: 5.0000 mg | ORAL_TABLET | Freq: Every evening | ORAL | Status: DC | PRN

## 2016-08-29 MED ORDER — GLYCOPYRROLATE 0.2 MG/ML IJ SOLN
INTRAMUSCULAR | Status: DC | PRN
  Administered 2016-08-29: 0.1 mg via INTRAVENOUS

## 2016-08-29 MED ORDER — MIDAZOLAM HCL 2 MG/2ML IJ SOLN
1.0000 mg | Freq: Once | INTRAMUSCULAR | Status: AC | PRN
Start: 2016-08-29 — End: 2016-08-29
  Administered 2016-08-29: 2 mg via INTRAVENOUS

## 2016-08-29 MED ORDER — HYDROMORPHONE HCL 1 MG/ML IJ SOLN
INTRAMUSCULAR | Status: AC
  Filled 2016-08-29: qty 1

## 2016-08-29 MED ORDER — LIDOCAINE HCL (PF) 1 % IJ SOLN
INTRAMUSCULAR | Status: DC | PRN
  Administered 2016-08-29: 30 mg

## 2016-08-29 MED ORDER — ACETAMINOPHEN 325 MG PO TABS
ORAL_TABLET | ORAL | Status: AC
  Filled 2016-08-29: qty 2

## 2016-08-29 MED ORDER — KETOROLAC TROMETHAMINE 30 MG/ML IJ SOLN
30.0000 mg | Freq: Once | INTRAMUSCULAR | Status: AC
  Administered 2016-08-29: 30 mg via INTRAVENOUS
  Filled 2016-08-29: qty 1

## 2016-08-29 MED ORDER — FENTANYL CITRATE (PF) 100 MCG/2ML IJ SOLN
INTRAMUSCULAR | Status: DC | PRN
  Administered 2016-08-29 (×3): 50 ug via INTRAVENOUS
  Administered 2016-08-29: 100 ug via INTRAVENOUS
  Administered 2016-08-29: 200 ug via INTRAVENOUS
  Administered 2016-08-29: 50 ug via INTRAVENOUS

## 2016-08-29 MED ORDER — SIMETHICONE 80 MG PO CHEW: 80.0000 mg | CHEWABLE_TABLET | Freq: Four times a day (QID) | ORAL | Status: DC | PRN

## 2016-08-29 MED ORDER — BUPIVACAINE LIPOSOME 1.3 % IJ SUSP
INTRAMUSCULAR | Status: AC
  Filled 2016-08-29: qty 20

## 2016-08-29 MED ORDER — MIDAZOLAM HCL 2 MG/2ML IJ SOLN
INTRAMUSCULAR | Status: AC
  Filled 2016-08-29: qty 2

## 2016-08-29 MED ORDER — ONDANSETRON HCL 4 MG/2ML IJ SOLN: 4.0000 mg | Freq: Once | INTRAMUSCULAR | Status: DC

## 2016-08-29 MED ORDER — ONDANSETRON HCL 4 MG/2ML IJ SOLN
INTRAMUSCULAR | Status: DC | PRN
  Administered 2016-08-29: 4 mg via INTRAVENOUS

## 2016-08-29 MED ORDER — ONDANSETRON HCL 4 MG/2ML IJ SOLN
INTRAMUSCULAR | Status: AC
  Filled 2016-08-29: qty 4

## 2016-08-29 MED ORDER — HYDROMORPHONE HCL 1 MG/ML IJ SOLN
1.0000 mg | INTRAMUSCULAR | Status: DC | PRN
  Administered 2016-08-29 – 2016-08-30 (×4): 2 mg via INTRAVENOUS
  Filled 2016-08-29 (×4): qty 2

## 2016-08-29 MED ORDER — SUGAMMADEX SODIUM 500 MG/5ML IV SOLN
INTRAVENOUS | Status: AC
  Filled 2016-08-29: qty 5

## 2016-08-29 MED ORDER — ALUM & MAG HYDROXIDE-SIMETH 200-200-20 MG/5ML PO SUSP: 30.0000 mL | ORAL | Status: DC | PRN

## 2016-08-29 MED ORDER — BUPIVACAINE LIPOSOME 1.3 % IJ SUSP
20.0000 mL | Freq: Once | INTRAMUSCULAR | Status: DC
  Filled 2016-08-29: qty 20

## 2016-08-29 MED ORDER — BISACODYL 10 MG RE SUPP: 10.0000 mg | Freq: Every day | RECTAL | Status: DC | PRN

## 2016-08-29 MED ORDER — ACETAMINOPHEN 325 MG PO TABS
650.0000 mg | ORAL_TABLET | Freq: Once | ORAL | Status: AC
  Administered 2016-08-29: 650 mg via ORAL

## 2016-08-29 MED ORDER — CYCLOSPORINE 0.05 % OP EMUL
1.0000 [drp] | Freq: Two times a day (BID) | OPHTHALMIC | Status: DC
  Administered 2016-08-29 – 2016-08-30 (×3): 1 [drp] via OPHTHALMIC
  Filled 2016-08-29 (×2): qty 1

## 2016-08-29 MED ORDER — HYDROMORPHONE HCL 1 MG/ML IJ SOLN
0.5000 mg | INTRAMUSCULAR | Status: AC | PRN
  Administered 2016-08-29 (×4): 0.5 mg via INTRAVENOUS
  Filled 2016-08-29: qty 1

## 2016-08-29 MED ORDER — KCL IN DEXTROSE-NACL 20-5-0.45 MEQ/L-%-% IV SOLN
INTRAVENOUS | Status: DC
  Administered 2016-08-29 – 2016-08-30 (×4): via INTRAVENOUS

## 2016-08-29 MED ORDER — SENNOSIDES-DOCUSATE SODIUM 8.6-50 MG PO TABS: 1.0000 | ORAL_TABLET | Freq: Every evening | ORAL | Status: DC | PRN

## 2016-08-29 MED ORDER — SUGAMMADEX SODIUM 500 MG/5ML IV SOLN
INTRAVENOUS | Status: DC | PRN
  Administered 2016-08-29: 300 mg via INTRAVENOUS

## 2016-08-29 MED ORDER — ONDANSETRON HCL 4 MG PO TABS: 8.0000 mg | ORAL_TABLET | Freq: Four times a day (QID) | ORAL | Status: DC | PRN

## 2016-08-29 MED ORDER — FENTANYL CITRATE (PF) 100 MCG/2ML IJ SOLN
25.0000 ug | INTRAMUSCULAR | Status: DC | PRN
  Administered 2016-08-29: 50 ug via INTRAVENOUS
  Administered 2016-08-29: 25 ug via INTRAVENOUS
  Administered 2016-08-29: 50 ug via INTRAVENOUS
  Administered 2016-08-29: 25 ug via INTRAVENOUS
  Filled 2016-08-29 (×2): qty 2

## 2016-08-29 SURGICAL SUPPLY — 56 items
APPLIER CLIP 13 LRG OPEN (CLIP)
BAG HAMPER (MISCELLANEOUS) ×3 IMPLANT
BLADE SURG SZ10 CARB STEEL (BLADE) ×3 IMPLANT
CATH FOLEY LATEX FREE 14FR (CATHETERS) ×1
CATH FOLEY LF 14FR (CATHETERS) ×2 IMPLANT
CELLS DAT CNTRL 66122 CELL SVR (MISCELLANEOUS) IMPLANT
CLIP APPLIE 13 LRG OPEN (CLIP) IMPLANT
CLOTH BEACON ORANGE TIMEOUT ST (SAFETY) ×3 IMPLANT
COVER LIGHT HANDLE STERIS (MISCELLANEOUS) ×6 IMPLANT
DERMABOND ADHESIVE PROPEN (GAUZE/BANDAGES/DRESSINGS) ×1
DERMABOND ADVANCED .7 DNX6 (GAUZE/BANDAGES/DRESSINGS) ×2 IMPLANT
DRAPE WARM FLUID 44X44 (DRAPE) ×3 IMPLANT
DRSG OPSITE POSTOP 4X8 (GAUZE/BANDAGES/DRESSINGS) ×3 IMPLANT
ELECT REM PT RETURN 9FT ADLT (ELECTROSURGICAL) ×3
ELECTRODE REM PT RTRN 9FT ADLT (ELECTROSURGICAL) ×2 IMPLANT
FORMALIN 10 PREFIL 480ML (MISCELLANEOUS) ×3 IMPLANT
GAUZE SPONGE 4X4 3PLY NS LF (GAUZE/BANDAGES/DRESSINGS) ×6 IMPLANT
GLOVE BIOGEL PI IND STRL 7.0 (GLOVE) ×10 IMPLANT
GLOVE BIOGEL PI IND STRL 8 (GLOVE) ×2 IMPLANT
GLOVE BIOGEL PI INDICATOR 7.0 (GLOVE) ×5
GLOVE BIOGEL PI INDICATOR 8 (GLOVE) ×1
GLOVE ECLIPSE 8.0 STRL XLNG CF (GLOVE) ×3 IMPLANT
GOWN STRL REUS W/ TWL LRG LVL3 (GOWN DISPOSABLE) ×4 IMPLANT
GOWN STRL REUS W/TWL LRG LVL3 (GOWN DISPOSABLE) ×2
GOWN STRL REUS W/TWL XL LVL3 (GOWN DISPOSABLE) ×3 IMPLANT
HEMOSTAT ARISTA ABSORB 3G PWDR (MISCELLANEOUS) ×3 IMPLANT
INST SET MAJOR GENERAL (KITS) ×3 IMPLANT
KIT ROOM TURNOVER APOR (KITS) ×3 IMPLANT
LIQUID BAND (GAUZE/BANDAGES/DRESSINGS) ×3 IMPLANT
MANIFOLD NEPTUNE II (INSTRUMENTS) ×3 IMPLANT
NEEDLE HYPO 21X1.5 SAFETY (NEEDLE) ×3 IMPLANT
NS IRRIG 1000ML POUR BTL (IV SOLUTION) ×9 IMPLANT
PACK ABDOMINAL MAJOR (CUSTOM PROCEDURE TRAY) ×3 IMPLANT
PAD ABD 5X9 TENDERSORB (GAUZE/BANDAGES/DRESSINGS) ×6 IMPLANT
PAD ABD 8X7 1/2 STERILE (GAUZE/BANDAGES/DRESSINGS) ×3 IMPLANT
PAD ARMBOARD 7.5X6 YLW CONV (MISCELLANEOUS) ×3 IMPLANT
RETRACTOR WND ALEXIS 25 LRG (MISCELLANEOUS) IMPLANT
RTRCTR WOUND ALEXIS 18CM MED (MISCELLANEOUS)
RTRCTR WOUND ALEXIS 25CM LRG (MISCELLANEOUS)
SET BASIN LINEN APH (SET/KITS/TRAYS/PACK) ×3 IMPLANT
STAPLER VISISTAT 35W (STAPLE) ×3 IMPLANT
SUCTION YANKAUER HANDLE (MISCELLANEOUS) ×3 IMPLANT
SUT CHROMIC 0 CT 1 (SUTURE) ×3 IMPLANT
SUT MNCRL+ AB 3-0 CT1 36 (SUTURE) IMPLANT
SUT MON AB 3-0 SH 27 (SUTURE) ×6 IMPLANT
SUT MONOCRYL AB 3-0 CT1 36IN (SUTURE)
SUT PLAIN 2 0 XLH (SUTURE) IMPLANT
SUT VIC AB 0 CT1 27 (SUTURE) ×3
SUT VIC AB 0 CT1 27XBRD ANTBC (SUTURE) IMPLANT
SUT VIC AB 0 CT1 27XCR 8 STRN (SUTURE) ×6 IMPLANT
SUT VIC AB 0 CTX 36 (SUTURE) ×1
SUT VIC AB 0 CTX36XBRD ANTBCTR (SUTURE) ×2 IMPLANT
SUT VICRYL 3 0 (SUTURE) ×3 IMPLANT
SYR 20CC LL (SYRINGE) ×3 IMPLANT
TOWEL BLUE STERILE X RAY DET (MISCELLANEOUS) ×3 IMPLANT
TRAY FOLEY W/METER SILVER 16FR (SET/KITS/TRAYS/PACK) ×3 IMPLANT

## 2016-08-29 NOTE — Interval H&P Note (Signed)
History and Physical Interval Note:  08/29/2016 9:51 AM  Stacy Wolf  has presented today for surgery, with the diagnosis of Endometrial Polyp Menometrorrhagia Dysmenorrhea  The various methods of treatment have been discussed with the patient and family. After consideration of risks, benefits and other options for treatment, the patient has consented to  Procedure(s): HYSTERECTOMY SUPRACERVICAL ABDOMINAL (N/A) BILATERAL SALPINGECTOMY (Bilateral) as a surgical intervention .  The patient's history has been reviewed, patient examined, no change in status, stable for surgery.  I have reviewed the patient's chart and labs.  Questions were answered to the patient's satisfaction.     Ollie Esty H

## 2016-08-29 NOTE — Anesthesia Postprocedure Evaluation (Signed)
Anesthesia Post Note  Patient: Stacy Wolf  Procedure(s) Performed: Procedure(s) (LRB): HYSTERECTOMY SUPRACERVICAL ABDOMINAL (N/A) BILATERAL SALPINGECTOMY (Bilateral)  Patient location during evaluation: PACU Anesthesia Type: General Level of consciousness: awake and patient cooperative Pain management: pain level controlled Vital Signs Assessment: post-procedure vital signs reviewed and stable Respiratory status: spontaneous breathing, nonlabored ventilation and respiratory function stable Cardiovascular status: blood pressure returned to baseline Postop Assessment: no signs of nausea or vomiting Anesthetic complications: no     Last Vitals:  Vitals:   08/29/16 1330 08/29/16 1342  BP: 125/86   Pulse: (!) 59 80  Resp: 15 20  Temp:      Last Pain:  Vitals:   08/29/16 1342  TempSrc:   PainSc: 9                  Alam Guterrez J

## 2016-08-29 NOTE — Op Note (Signed)
Preoperative diagnosis:  1.  Large fibroid uterus, volume of 450 cc,                                          2.  Multiple fibroids                                         3.  Endometrial polyp                                         4.  Menometrorrhagia                                         5.  Dysmenorrhea  Postoperative diagnosis:  Same as above plus severe adhesions of the bladder to the lower uterine segment  Procedure:  Abdominal hysterectomy, supracervical with removal of both fallopian tubes  Surgeon:  Florian Buff  Assistant:    Anesthesia:  General endotracheal  Preoperative clinical summary:   (This is copied from my office note H&P 08/20/2016) Stacy Wolf L Allenis a 41 y.o.G3P0 with Patient's last menstrual period was 07/17/2016.admitted for a supracervical abdominal hysterectomy with removal of both both fallopian tubes for ovarian cancer prophylaxis.  Patient has a several year history of heavy prolonged vaginal bleeding with her periods which can be quite painful During a workup in our office we performed a sonogram which revealed multiple fibroids couple of a murmur over 6 cm with a uterine volume of 447 cc Additionally there is a 1.7 cm endometrial polyp The ovaries are normal and sonogram The uterine size is 16 weeks' size the patient does desire to preserve her cervix but she knows she will have to get Pap smears throughout her life to do that  As result she'll be admitted for a  abdominal approach supracervical hysterectomy and removal of both fallopian tubes Plan is to preserving her ovaries unless there are some unforeseen problem  Intraoperative findings: Multiple fibroids were present, the bladder was densely adherent to the lower uterine segment and up to the level of the uterine vessels, both ovaries appeared to be normal, there were no other intraperitoneal abnormalities noted  Description of operation:  Patient was taken to the operating room and placed in  the supine position where she underwent general endotracheal anesthesia.  She was then prepped and draped in the usual sterile fashion and a Foley catheter was placed for continuous bladder drainage.  A Pfannenstiel skin incision was made and carried down sharply to the rectus fascia which was scored in the midline and extended laterally.  The fascia was taken off the muscles superiorly and inferiorly without difficulty.  The muscles were divided.  The peritoneal cavity was entered.  An large Alexis self-retaining retractor was placed.  The upper abdomen was packed away. Both uterine cornu were grasped with Coker clamps.  The left round ligament was suture ligated and coagulated with the electrocautery unit.  The bladder was high on the lower uterine segment even to the level of the uterine vessels and was densely adherent.   The left vesicouterine serosal flap was  created.  An avascular window in in the peritoneum was created and the utero-ovarian ligament was cross clamped, cut and suture ligated.  The right round ligament was suture ligated and cut with the electrocautery unit.  The vesicouterine serosal flap on the right was created.  An avascular window in the peritoneum was created and the right utero-ovarian ligament was cross clamped, cut and double suture ligated.  Thus both ovaries were preserved.  The uterine vessels were skeletonized bilaterally.  The uterine vessels were clamped bilaterally,  then cut and suture ligated.  Two more pedicles were taken down the cervix medial to the uterine vessels.  Each pedicle was clamped cut and suture ligated with good resulting hemostasis.  As per the preoperative plan the cervix was then transected sharply and the specimen was removed.  The cervical stump was then closed anterior to posterior for hemostasis and reduce postoperative adhesions.  The pelvis was irrigated vigorously and all pedicles were examined and found to be hemostatic.  Arista was used for  hemostasis of the peritoneal surfaces.  Both fallopian tubes were removed by crossclamping and for and aft suture ligating.  All specimens were sent to pathology for routine evaluation.  The Alexis self-retaining retractor was removed and the pelvis was irrigated vigorously.  All packs were removed and all counts were correct at this point x 3.  The muscles and peritoneum were reapproximated loosely.  The fascia was closed with 0 Vicryl running.    Exparel was injected into the subcutaneous tissue for postoperative incisional pain management .  The skin was closed using skin staples. The patient was awakened from anesthesia taken to the recovery room in good stable condition. All sponge instrument and needle counts were correct x 3.  The patient received 2 g of Ancef and Toradol 30 mg prophylactically preoperatively.  Estimated blood loss for the procedure was 200  cc.  Rosine Solecki H 08/29/2016 12:28 PM

## 2016-08-29 NOTE — Transfer of Care (Signed)
Immediate Anesthesia Transfer of Care Note  Patient: Stacy Wolf  Procedure(s) Performed: Procedure(s): HYSTERECTOMY SUPRACERVICAL ABDOMINAL (N/A) BILATERAL SALPINGECTOMY (Bilateral)  Patient Location: PACU  Anesthesia Type:General  Level of Consciousness: awake, alert , oriented and patient cooperative  Airway & Oxygen Therapy: Patient Spontanous Breathing and Patient connected to nasal cannula oxygen  Post-op Assessment: Report given to RN, Post -op Vital signs reviewed and stable and Patient moving all extremities X 4  Post vital signs: Reviewed and stable  Last Vitals:  Vitals:   08/29/16 0955 08/29/16 1000  BP: 131/83   Pulse:    Resp: (!) 73 (!) 56  Temp:      Last Pain:  Vitals:   08/29/16 0859  TempSrc: Oral         Complications: No apparent anesthesia complications

## 2016-08-29 NOTE — Anesthesia Procedure Notes (Signed)
Procedure Name: Intubation Date/Time: 08/29/2016 10:25 AM Performed by: Gilmer Mor R Pre-anesthesia Checklist: Patient identified, Emergency Drugs available, Suction available and Patient being monitored Patient Re-evaluated:Patient Re-evaluated prior to inductionOxygen Delivery Method: Circle system utilized Preoxygenation: Pre-oxygenation with 100% oxygen Intubation Type: IV induction Ventilation: Mask ventilation without difficulty Laryngoscope Size: Mac and 3 Grade View: Grade II Tube type: Oral Tube size: 7.0 mm Number of attempts: 1 Airway Equipment and Method: Stylet Placement Confirmation: ETT inserted through vocal cords under direct vision,  positive ETCO2 and breath sounds checked- equal and bilateral Secured at: 21 (right lip) cm Tube secured with: Tape Dental Injury: Teeth and Oropharynx as per pre-operative assessment

## 2016-08-29 NOTE — Anesthesia Preprocedure Evaluation (Signed)
Anesthesia Evaluation  Patient identified by MRN, date of birth, ID band  Airway Mallampati: I       Dental  (+) Teeth Intact   Pulmonary Current Smoker,    Pulmonary exam normal        Cardiovascular hypertension, Normal cardiovascular exam+ Valvular Problems/Murmurs  Rhythm:Regular Rate:Normal     Neuro/Psych  Headaches, Anxiety Depression Bipolar Disorder  Neuromuscular disease    GI/Hepatic GERD  ,  Endo/Other    Renal/GU      Musculoskeletal   Abdominal (+) + obese,   Peds  Hematology   Anesthesia Other Findings   Reproductive/Obstetrics                             Anesthesia Physical Anesthesia Plan  ASA: II  Anesthesia Plan: General   Post-op Pain Management:    Induction: Intravenous  PONV Risk Score and Plan: Ondansetron and Dexamethasone  Airway Management Planned: Oral ETT  Additional Equipment:   Intra-op Plan:   Post-operative Plan: Extubation in OR  Informed Consent: I have reviewed the patients History and Physical, chart, labs and discussed the procedure including the risks, benefits and alternatives for the proposed anesthesia with the patient or authorized representative who has indicated his/her understanding and acceptance.     Plan Discussed with: CRNA  Anesthesia Plan Comments:         Anesthesia Quick Evaluation

## 2016-08-29 NOTE — H&P (Signed)
Preoperative History and Physical  (This is copied from my office note H&P 08/20/2016) Stacy Wolf is a 41 y.o. G3P0 with Patient's last menstrual period was 07/17/2016. admitted for a supracervical abdominal hysterectomy with removal of both both fallopian tubes for ovarian cancer prophylaxis.  Patient has a several year history of heavy prolonged vaginal bleeding with her periods which can be quite painful During a workup in our office we performed a sonogram which revealed multiple fibroids couple of a murmur over 6 cm with a uterine volume of 447 cc Additionally there is a 1.7 cm endometrial polyp The ovaries are normal and sonogram The uterine size is 16 weeks' size the patient does desire to preserve her cervix but she knows she will have to get Pap smears throughout her life to do that  As result she'll be admitted for a  abdominal approach supracervical hysterectomy and removal of both fallopian tubes Lanolin preserving her ovaries unless there are some unforeseen problem  PMH:        Past Medical History:  Diagnosis Date  . Anxiety   . Bipolar 1 disorder ([place])   . Heart murmur   . Migraines   . Schizophrenia ([place])     PSH:          Past Surgical History:  Procedure Laterality Date  . arm surgery Right    tendonitis-wrist  . CARPAL TUNNEL RELEASE Left 04/11/2016   Procedure: CARPAL TUNNEL RELEASE;  Surgeon: [name], MD;  Location: [facility];  Service: Orthopedics;  Laterality: Left;  . TUBAL LIGATION      POb/GynH:              OB History    Gravida Para Term Preterm AB Living   3  3           SAB TAB Ectopic Multiple Live Births                  SH:        Social History  Substance Use Topics  . Smoking status: Current Every Day Smoker    Packs/day: 0.50    Years: 7.00    Types: Cigarettes  . Smokeless tobacco: Never Used  . Alcohol use No    FH:         Family History  Problem Relation Age of Onset  .  Cancer Mother 93       breast CA at 70 and uterine CA at 45  . Hypertension Mother   . Kidney disease Mother        kidney transplant  . Cancer Sister 26       uterine  . Cancer Sister 16       uterine  . Diabetes Father   . Liver disease Father   . Cancer Maternal Aunt        breast     Allergies:       Allergies  Allergen Reactions  . Latuda [Lurasidone Hcl]     Suicidal thoughts   . Wellbutrin [Bupropion] Other (See Comments)    Seizures / lowered seizure threshold  . Zoloft [Sertraline Hcl] Hives  . Haloperidol And Related     Leg cramps  . Imitrex [Sumatriptan]     Could not swallow after taking med  . Nicoderm [Nicotine] Dermatitis    Medications:       Current Outpatient Prescriptions:  .  ibuprofen (ADVIL,MOTRIN) 800 MG tablet, Take 1 tablet (800 mg total) by  mouth every 8 (eight) hours as needed. (Patient not taking: Reported on 08/20/2016), Disp: 90 tablet, Rfl: 1  Review of Systems:   Review of Systems  Constitutional: Negative for fever, chills, weight loss, malaise/fatigue and diaphoresis.  HENT: Negative for hearing loss, ear pain, nosebleeds, congestion, sore throat, neck pain, tinnitus and ear discharge.   Eyes: Negative for blurred vision, double vision, photophobia, pain, discharge and redness.  Respiratory: Negative for cough, hemoptysis, sputum production, shortness of breath, wheezing and stridor.   Cardiovascular: Negative for chest pain, palpitations, orthopnea, claudication, leg swelling and PND.  Gastrointestinal: Positive for abdominal pain. Negative for heartburn, nausea, vomiting, diarrhea, constipation, blood in stool and melena.  Genitourinary: Negative for dysuria, urgency, frequency, hematuria and flank pain.  Musculoskeletal: Negative for myalgias, back pain, joint pain and falls.  Skin: Negative for itching and rash.  Neurological: Negative for dizziness, tingling, tremors, sensory change, speech change,  focal weakness, seizures, loss of consciousness, weakness and headaches.  Endo/Heme/Allergies: Negative for environmental allergies and polydipsia. Does not bruise/bleed easily.  Psychiatric/Behavioral: Negative for depression, suicidal ideas, hallucinations, memory loss and substance abuse. The patient is not nervous/anxious and does not have insomnia.      PHYSICAL EXAM:  Blood pressure 100/70, pulse 72, weight 193 lb (87.5 kg), last menstrual period 07/17/2016.    Vitals reviewed. Constitutional: She is oriented to person, place, and time. She appears well-developed and well-nourished.  HENT:  Head: Normocephalic and atraumatic.  Right Ear: External ear normal.  Left Ear: External ear normal.  Nose: Nose normal.  Mouth/Throat: Oropharynx is clear and moist.  Eyes: Conjunctivae and EOM are normal. Pupils are equal, round, and reactive to light. Right eye exhibits no discharge. Left eye exhibits no discharge. No scleral icterus.  Neck: Normal range of motion. Neck supple. No tracheal deviation present. No thyromegaly present.  Cardiovascular: Normal rate, regular rhythm, normal heart sounds and intact distal pulses.  Exam reveals no gallop and no friction rub.   No murmur heard. Respiratory: Effort normal and breath sounds normal. No respiratory distress. She has no wheezes. She has no rales. She exhibits no tenderness.  GI: Soft. Bowel sounds are normal. She exhibits no distension and no mass. There is tenderness. There is no rebound and no guarding.  Genitourinary:       Vulva is normal without lesions Vagina is pink moist without discharge Cervix normal in appearance and pap is normal in 01/2016 Uterus is 16 week size multiple fibroids palpable  Adnexa is negative with normal sized ovaries by sonogram  Musculoskeletal: Normal range of motion. She exhibits no edema and no tenderness.  Neurological: She is alert and oriented to person, place, and time. She has normal reflexes.  She displays normal reflexes. No cranial nerve deficit. She exhibits normal muscle tone. Coordination normal.  Skin: Skin is warm and dry. No rash noted. No erythema. No pallor.  Psychiatric: She has a normal mood and affect. Her behavior is normal. Judgment and thought content normal.    Labs: Results for orders placed or performed during the hospital encounter of 08/23/16 (from the past 168 hour(s))  Surgical pcr screen   Collection Time: 08/23/16  1:11 PM  Result Value Ref Range   MRSA, PCR NEGATIVE NEGATIVE   Staphylococcus aureus NEGATIVE NEGATIVE  CBC   Collection Time: 08/23/16  1:11 PM  Result Value Ref Range   WBC 6.6 4.0 - 10.5 K/uL   RBC 4.49 3.87 - 5.11 MIL/uL   Hemoglobin 13.5 12.0 - 15.0  g/dL   HCT 41.1 36.0 - 46.0 %   MCV 91.5 78.0 - 100.0 fL   MCH 30.1 26.0 - 34.0 pg   MCHC 32.8 30.0 - 36.0 g/dL   RDW 14.3 11.5 - 15.5 %   Platelets 261 150 - 400 K/uL  Comprehensive metabolic panel   Collection Time: 08/23/16  1:11 PM  Result Value Ref Range   Sodium 139 135 - 145 mmol/L   Potassium 3.6 3.5 - 5.1 mmol/L   Chloride 107 101 - 111 mmol/L   CO2 24 22 - 32 mmol/L   Glucose, Bld 117 (H) 65 - 99 mg/dL   BUN 10 6 - 20 mg/dL   Creatinine, Ser 0.92 0.44 - 1.00 mg/dL   Calcium 9.1 8.9 - 10.3 mg/dL   Total Protein 7.0 6.5 - 8.1 g/dL   Albumin 4.0 3.5 - 5.0 g/dL   AST 17 15 - 41 U/L   ALT 11 (L) 14 - 54 U/L   Alkaline Phosphatase 73 38 - 126 U/L   Total Bilirubin 0.3 0.3 - 1.2 mg/dL   GFR calc non Af Amer >60 >60 mL/min   GFR calc Af Amer >60 >60 mL/min   Anion gap 8 5 - 15  hCG, quantitative, pregnancy   Collection Time: 08/23/16  1:11 PM  Result Value Ref Range   hCG, Beta Chain, Quant, S <1 <5 mIU/mL  Rapid HIV screen (HIV 1/2 Ab+Ag)   Collection Time: 08/23/16  1:11 PM  Result Value Ref Range   HIV-1 P24 Antigen - HIV24 NON REACTIVE NON REACTIVE   HIV 1/2 Antibodies NON REACTIVE NON REACTIVE   Interpretation (HIV Ag Ab)      A non reactive test result  means that HIV 1 or HIV 2 antibodies and HIV 1 p24 antigen were not detected in the specimen.  Urinalysis, Routine w reflex microscopic   Collection Time: 08/23/16  1:11 PM  Result Value Ref Range   Color, Urine YELLOW YELLOW   APPearance HAZY (A) CLEAR   Specific Gravity, Urine 1.019 1.005 - 1.030   pH 5.0 5.0 - 8.0   Glucose, UA NEGATIVE NEGATIVE mg/dL   Hgb urine dipstick SMALL (A) NEGATIVE   Bilirubin Urine NEGATIVE NEGATIVE   Ketones, ur NEGATIVE NEGATIVE mg/dL   Protein, ur NEGATIVE NEGATIVE mg/dL   Nitrite NEGATIVE NEGATIVE   Leukocytes, UA NEGATIVE NEGATIVE   RBC / HPF 0-5 0 - 5 RBC/hpf   WBC, UA 0-5 0 - 5 WBC/hpf   Bacteria, UA RARE (A) NONE SEEN   Squamous Epithelial / LPF 0-5 (A) NONE SEEN   Mucous PRESENT   Type and screen   Collection Time: 08/23/16  1:11 PM  Result Value Ref Range   ABO/RH(D) O POS    Antibody Screen NEG    Sample Expiration 09/06/2016    Extend sample reason NO TRANSFUSIONS OR PREGNANCY IN THE PAST 3 MONTHS          Results for orders placed or performed in visit on 08/13/16 (from the past 336 hour(s))  POCT hemoglobin   Collection Time: 08/13/16 10:34 AM  Result Value Ref Range   Hemoglobin 14.3 12.2 - 16.2 g/dL  POCT Wet Prep [facility] [place])   Collection Time: 08/13/16 11:26 AM  Result Value Ref Range   Source Wet Prep POC vaginal    WBC, Wet Prep HPF POC none    Bacteria Wet Prep HPF POC None (A) Few   BACTERIA WET PREP MORPHOLOGY POC  Clue Cells Wet Prep HPF POC Many (A) None   Clue Cells Wet Prep Whiff POC Positive Whiff    Yeast Wet Prep HPF POC None    KOH Wet Prep POC     Trichomonas Wet Prep HPF POC Absent Absent    EKG:    Orders placed or performed during the hospital encounter of 04/06/16  . EKG 12-Lead  . EKG 12-Lead    Imaging Studies:  Imaging Results  US Ob Transvaginal  Result Date: 08/20/2016 GYNECOLOGIC SONOGRAM DIAN LAPRADE is a 41 y.o. LMP 07/17/2016 for a pelvic sonogram for  heavy irregular periods. Uterus                      12.9 x 8.3 x 8.1 cm, enlarged heterogeneous anteverted uterus w/mult fibroids Endometrium          12.5 mm, symmetrical, 1.4 x .7 x 1.2 cm echogenic mass w/in the endometrium w/ color flow Right ovary             3.3 x 2.1 x 3.2 cm, wnl Left ovary                3.4 x 2.2 x 3.3 cm, simple left ovarian cyst 2.5 x 2.2 x 2 cm No free fluid Fibroids:  (#1) anterior left subserosal fibroid 6 x 6.4 x 5.1 cm,(#2) fundal subserosal fibroid 4.6 x 3.8 x 3.7 cm                 Technician Comments: PELVIC US TA/TV: enlarged heterogeneous anteverted uterus w/mult fibroids,(#1) anterior left subserosal fibroid 6 x 6.4 x 5.1 cm,(#2) fundal subserosal fibroid 4.6 x 3.8 x 3.7 cm,EEC 12.8 mm,1.4 x .7 x 1.2 cm echogenic mass w/in the endometrium w/ color flow,normal right ovary,simple left ovarian cyst 2.5 x 2.2 x 2 cm,no free fluid,no pain during ultrasound,ovaries appear mobile [employer] 08/20/2016 11:10 AM Clinical Impression and recommendations: I have reviewed the sonogram results above, combined with the patient's current clinical course, below are my impressions and any appropriate recommendations for management based on the sonographic findings. Uterus is enlarged with volume 447 cc, multiple fibroids, noted Endometrial polyp is noted Both ovaries are normal Janellie Tennison H 08/20/2016 11:49 AM   US Pelvis Complete  Result Date: 08/20/2016 GYNECOLOGIC SONOGRAM ADIN LARICCIA is a 41 y.o. LMP 07/17/2016 for a pelvic sonogram for heavy irregular periods. Uterus                      12.9 x 8.3 x 8.1 cm, enlarged heterogeneous anteverted uterus w/mult fibroids Endometrium          12.5 mm, symmetrical, 1.4 x .7 x 1.2 cm echogenic mass w/in the endometrium w/ color flow Right ovary             3.3 x 2.1 x 3.2 cm, wnl Left ovary                3.4 x 2.2 x 3.3 cm, simple left ovarian cyst 2.5 x 2.2 x 2 cm No free fluid Fibroids:  (#1) anterior left subserosal fibroid 6 x 6.4 x  5.1 cm,(#2) fundal subserosal fibroid 4.6 x 3.8 x 3.7 cm                 Technician Comments: PELVIC US TA/TV: enlarged heterogeneous anteverted uterus w/mult fibroids,(#1) anterior left subserosal fibroid 6 x 6.4 x 5.1 cm,(#2) fundal subserosal fibroid 4.6 x 3.8  x 3.7 cm,EEC 12.8 mm,1.4 x .7 x 1.2 cm echogenic mass w/in the endometrium w/ color flow,normal right ovary,simple left ovarian cyst 2.5 x 2.2 x 2 cm,no free fluid,no pain during ultrasound,ovaries appear mobile [employer] 08/20/2016 11:10 AM Clinical Impression and recommendations: I have reviewed the sonogram results above, combined with the patient's current clinical course, below are my impressions and any appropriate recommendations for management based on the sonographic findings. Uterus is enlarged with volume 447 cc, multiple fibroids, noted Endometrial polyp is noted Both ovaries are normal Oluwaseyi Tull H 08/20/2016 11:49 AM       Assessment: Enlarged fibroid uterus, multiple fibroids, 16 weeks size Endometrial polyp Menometrorrhagia Dysmenorrhea      Patient Active Problem List   Diagnosis Date Noted  . Menorrhagia with irregular cycle 08/13/2016  . Obesity (BMI 30.0-34.9) 04/23/2016  . Metabolic syndrome 81/12/3157  . Carpal tunnel syndrome, left upper limb   . Essential hypertension, benign 01/16/2016  . Migraine headache 04/18/2015  . Tobacco abuse 02/02/2015  . Constipation 02/02/2015  . Gastroesophageal reflux disease without esophagitis 08/24/2014  . Hyperlipidemia LDL goal <100 09/14/2013  . Bipolar disorder ([place]) 08/25/2013    Plan: Abdominal approach supracervical hysterectomy with removal of both tubes preservation of her ovaries  Pt understands the risks of surgery including but not limited t  excessive bleeding requiring transfusion or reoperation, post-operative infection requiring prolonged hospitalization or re-hospitalization and antibiotic therapy, and damage to other organs including  bladder, bowel, ureters and major vessels.  The patient also understands the alternative treatment options which were discussed in full.  All questions were answered.  Nikyla Navedo H 08/20/2016 11:58 AM

## 2016-08-30 ENCOUNTER — Encounter (HOSPITAL_COMMUNITY): Payer: Self-pay | Admitting: Obstetrics & Gynecology

## 2016-08-30 LAB — BASIC METABOLIC PANEL
Anion gap: 8 (ref 5–15)
BUN: 8 mg/dL (ref 6–20)
CO2: 24 mmol/L (ref 22–32)
CREATININE: 0.8 mg/dL (ref 0.44–1.00)
Calcium: 8.7 mg/dL — ABNORMAL LOW (ref 8.9–10.3)
Chloride: 109 mmol/L (ref 101–111)
GFR calc Af Amer: >60 mL/min (ref >60)
Glucose, Bld: 129 mg/dL — ABNORMAL HIGH (ref 65–99)
Potassium: 4.4 mmol/L (ref 3.5–5.1)
SODIUM: 141 mmol/L (ref 135–145)

## 2016-08-30 LAB — CBC
HCT: 38.2 % (ref 36.0–46.0)
Hemoglobin: 12.4 g/dL (ref 12.0–15.0)
MCH: 29.9 pg (ref 26.0–34.0)
MCHC: 32.5 g/dL (ref 30.0–36.0)
MCV: 92 fL (ref 78.0–100.0)
PLATELETS: 265 10*3/uL (ref 150–400)
RBC: 4.15 MIL/uL (ref 3.87–5.11)
RDW: 13.8 % (ref 11.5–15.5)
WBC: 10 10*3/uL (ref 4.0–10.5)

## 2016-08-30 MED ORDER — ONDANSETRON HCL 8 MG PO TABS: 8.0000 mg | ORAL_TABLET | Freq: Four times a day (QID) | ORAL | 0 refills | Status: DC | PRN

## 2016-08-30 MED ORDER — KETOROLAC TROMETHAMINE 30 MG/ML IJ SOLN
30.0000 mg | Freq: Once | INTRAMUSCULAR | Status: AC
  Administered 2016-08-30: 30 mg via INTRAVENOUS
  Filled 2016-08-30: qty 1

## 2016-08-30 MED ORDER — OXYCODONE-ACETAMINOPHEN 5-325 MG PO TABS: 1.0000 | ORAL_TABLET | ORAL | 0 refills | Status: DC | PRN

## 2016-08-30 NOTE — Discharge Summary (Signed)
Physician Discharge Summary  Patient ID: Stacy Wolf MRN: [id number] DOB/AGE: 1975/06/11 41 y.o.  Admit date: 08/29/2016 Discharge date: 08/30/2016  Admission Diagnoses: Fibroids, menorrhagia dysmenorrhea  Discharge Diagnoses:  S/P abdominal supracervical hysterectomy with removal of both Fallopian tubes  Discharged Condition: good  Hospital Course: unremarkable post operative course  Consults: None  Significant Diagnostic Studies: labs:   Treatments: surgery: see op note  Discharge Exam: Blood pressure 129/72, pulse 60, temperature 98.1 F (36.7 C), temperature source Oral, resp. rate 18, height 5\' 3"  (1.6 m), weight 194 lb (88 kg), last menstrual period 08/10/2016, SpO2 99 %. General appearance: alert, cooperative and no distress GI: soft, non-tender; bowel sounds normal; no masses,  no organomegaly Incision/Wound:clean dry intact  Disposition: 01-Home or Self Care  Discharge Instructions    Call MD for:  persistant nausea and vomiting    Complete by:  As directed    Call MD for:  severe uncontrolled pain    Complete by:  As directed    Call MD for:  temperature >100.4    Complete by:  As directed    Diet - low sodium heart healthy    Complete by:  As directed    Driving Restrictions    Complete by:  As directed    Do not drive for 1 week   Increase activity slowly    Complete by:  As directed    Leave dressing on - Keep it clean, dry, and intact until clinic visit    Complete by:  As directed    Lifting restrictions    Complete by:  As directed    Do not lift more than 10 pounds for 6 weeks   Sexual Activity Restrictions    Complete by:  As directed    No sex for 6 weeks     Allergies as of 08/30/2016      Reactions   Imitrex [sumatriptan] Anaphylaxis   Could not swallow after taking med   Latuda [lurasidone Hcl]    Suicidal thoughts   Wellbutrin [bupropion] Other (See Comments)   Seizures / lowered seizure threshold   Zoloft [sertraline Hcl] Hives    Haloperidol And Related    Leg cramps   Nicoderm [nicotine] Dermatitis      Medication List    TAKE these medications   aspirin-acetaminophen-caffeine 250-250-65 MG tablet Commonly known as:  EXCEDRIN MIGRAINE Take 2 tablets by mouth daily as needed for headache.   ibuprofen 800 MG tablet Commonly known as:  ADVIL,MOTRIN Take 1 tablet (800 mg total) by mouth every 8 (eight) hours as needed. What changed:  when to take this  reasons to take this   ondansetron 8 MG tablet Commonly known as:  ZOFRAN Take 1 tablet (8 mg total) by mouth every 6 (six) hours as needed for nausea.   oxyCODONE-acetaminophen 5-325 MG tablet Commonly known as:  PERCOCET/ROXICET Take 1-2 tablets by mouth every 4 (four) hours as needed (moderate to severe pain (when tolerating fluids)).   RESTASIS MULTIDOSE 0.05 % ophthalmic emulsion Generic drug:  cycloSPORINE Place 1 drop into both eyes 2 (two) times daily.      Follow-up Information    Florian Buff, MD Follow up on 09/06/2016.   Specialties:  Obstetrics and Gynecology, Radiology Why:  post op visit Contact information: [place] [address] [phone]           Signed: Florian Buff 08/30/2016, 12:59 PM

## 2016-08-30 NOTE — Discharge Instructions (Signed)
Abdominal Hysterectomy, Care After °This sheet gives you information about how to care for yourself after your procedure. Your health care provider may also give you more specific instructions. If you have problems or questions, contact your health care provider. °What can I expect after the procedure? °After your procedure, it is common to have: °· Pain. °· Fatigue. °· Poor appetite. °· Less interest in sex. °· Vaginal bleeding and discharge. You may need to use a sanitary napkin after this procedure. ° °Follow these instructions at home: °Bathing °· Do not take baths, swim, or use a hot tub until your health care provider approves. Ask your health care provider if you can take showers. You may only be allowed to take sponge baths for bathing. °· Keep the bandage (dressing) dry until your health care provider says it can be removed. °Incision care °· Follow instructions from your health care provider about how to take care of your incision. Make sure you: °? Wash your hands with soap and water before you change your bandage (dressing). If soap and water are not available, use hand sanitizer. °? Change your dressing as told by your health care provider. °? Leave stitches (sutures), skin glue, or adhesive strips in place. These skin closures may need to stay in place for 2 weeks or longer. If adhesive strip edges start to loosen and curl up, you may trim the loose edges. Do not remove adhesive strips completely unless your health care provider tells you to do that. °· Check your incision area every day for signs of infection. Check for: °? Redness, swelling, or pain. °? Fluid or blood. °? Warmth. °? Pus or a bad smell. °Activity °· Do gentle, daily exercises as told by your health care provider. You may be told to take short walks every day and go farther each time. °· Do not lift anything that is heavier than 10 lb (4.5 kg), or the limit that your health care provider tells you, until he or she says that it is  safe. °· Do not drive or use heavy machinery while taking prescription pain medicine. °· Do not drive for 24 hours if you were given a medicine to help you relax (sedative). °· Follow your health care provider's instructions about exercise, driving, and general activities. Ask your health care provider what activities are safe for you. °Lifestyle °· Do not douche, use tampons, or have sex for at least 6 weeks or as told by your health care provider. °· Do not drink alcohol until your health care provider approves. °· Drink enough fluid to keep your urine clear or pale yellow. °· Try to have someone at home with you for the first 1-2 weeks to help. °· Do not use any products that contain nicotine or tobacco, such as cigarettes and e-cigarettes. These can delay healing. If you need help quitting, ask your health care provider. °General instructions °· Take over-the-counter and prescription medicines only as told by your health care provider. °· Do not take aspirin or ibuprofen. These medicines can cause bleeding. °· To prevent or treat constipation while you are taking prescription pain medicine, your health care provider may recommend that you: °? Drink enough fluid to keep your urine clear or pale yellow. °? Take over-the-counter or prescription medicines. °? Eat foods that are high in fiber, such as fresh fruits and vegetables, whole grains, and beans. °? Limit foods that are high in fat and processed sugars, such as fried and sweet foods. °· Keep all   follow-up visits as told by your health care provider. This is important. °Contact a health care provider if: °· You have chills or fever. °· You have redness, swelling, or pain around your incision. °· You have fluid or blood coming from your incision. °· Your incision feels warm to the touch. °· You have pus or a bad smell coming from your incision. °· Your incision breaks open. °· You feel dizzy or light-headed. °· You have pain or bleeding when you urinate. °· You  have persistent diarrhea. °· You have persistent nausea and vomiting. °· You have abnormal vaginal discharge. °· You have a rash. °· You have any type of abnormal reaction or you develop an allergy to your medicine. °· Your pain medicine does not help. °Get help right away if: °· You have a fever and your symptoms suddenly get worse. °· You have severe abdominal pain. °· You have shortness of breath. °· You faint. °· You have pain, swelling, or redness in your leg. °· You have heavy vaginal bleeding with blood clots. °Summary °· After your procedure, it is common to have pain, fatigue and vaginal discharge. °· Do not take baths, swim, or use a hot tub until your health care provider approves. Ask your health care provider if you can take showers. You may only be allowed to take sponge baths for bathing. °· Follow your health care provider's instructions about exercise, driving, and general activities. Ask your health care provider what activities are safe for you. °· Do not lift anything that is heavier than 10 lb (4.5 kg), or the limit that your health care provider tells you, until he or she says that it is safe. °· Try to have someone at home with you for the first 1-2 weeks to help. °This information is not intended to replace advice given to you by your health care provider. Make sure you discuss any questions you have with your health care provider. °Document Released: 09/08/2004 Document Revised: 02/08/2016 Document Reviewed: 02/08/2016 °Elsevier Interactive Patient Education © 2017 [employer] ° °

## 2016-09-04 ENCOUNTER — Telehealth: Payer: Self-pay | Admitting: Obstetrics & Gynecology

## 2016-09-04 NOTE — Telephone Encounter (Signed)
Pt called stating that she is completely out of pain medication prescribed to her after her surgery on 6/28. I informed pt that I would send a request for refill to Dr Elonda Husky. Pt verbalized understanding.

## 2016-09-05 NOTE — Telephone Encounter (Signed)
Noted I will address at her post op appointment

## 2016-09-06 ENCOUNTER — Ambulatory Visit (INDEPENDENT_AMBULATORY_CARE_PROVIDER_SITE_OTHER): Payer: Medicare Other | Admitting: Obstetrics & Gynecology

## 2016-09-06 ENCOUNTER — Encounter: Payer: Self-pay | Admitting: Obstetrics & Gynecology

## 2016-09-06 VITALS — BP 120/82 | HR 80 | Wt 189.0 lb

## 2016-09-06 DIAGNOSIS — Z9889 Other specified postprocedural states: Secondary | ICD-10-CM

## 2016-09-06 DIAGNOSIS — Z90710 Acquired absence of both cervix and uterus: Secondary | ICD-10-CM

## 2016-09-06 MED ORDER — OXYCODONE-ACETAMINOPHEN 5-325 MG PO TABS: 1.0000 | ORAL_TABLET | ORAL | 0 refills | Status: DC | PRN

## 2016-09-06 NOTE — Progress Notes (Signed)
  HPI: Patient returns for routine postoperative follow-up having undergone abdominal supracervical hysterectomy with removal of both fallopian tubes on 08/29/2016.  The patient's immediate postoperative recovery has been unremarkable. Since hospital discharge the patient reports incisional pain She has had a bowel movement She is voiding okay In general did not get around 8.   Current Outpatient Prescriptions: aspirin-acetaminophen-caffeine (EXCEDRIN MIGRAINE) 250-250-65 MG tablet, Take 2 tablets by mouth daily as needed for headache., Disp: , Rfl:  ibuprofen (ADVIL,MOTRIN) 800 MG tablet, Take 1 tablet (800 mg total) by mouth every 8 (eight) hours as needed. (Patient taking differently: Take 800 mg by mouth 2 (two) times daily as needed for moderate pain. ), Disp: 90 tablet, Rfl: 1 ondansetron (ZOFRAN) 8 MG tablet, Take 1 tablet (8 mg total) by mouth every 6 (six) hours as needed for nausea., Disp: 20 tablet, Rfl: 0 RESTASIS MULTIDOSE 0.05 % ophthalmic emulsion, Place 1 drop into both eyes 2 (two) times daily., Disp: , Rfl:  oxyCODONE-acetaminophen (PERCOCET/ROXICET) 5-325 MG tablet, Take 1-2 tablets by mouth every 4 (four) hours as needed (moderate to severe pain (when tolerating fluids)). (Patient not taking: Reported on 09/06/2016), Disp: 40 tablet, Rfl: 0  No current facility-administered medications for this visit.     Blood pressure 120/82, pulse 80, weight 189 lb (85.7 kg), last menstrual period 08/10/2016.  Physical Exam: Incisions clean dry and intact I took out one have her staples had a due quite a bit of abdominal revision someone leave the staples and a bit longer probably see her back next Tuesday and take the rest out Abdominal exam is otherwise benign  Diagnostic Tests:   Pathology: All pathology was benign including polyps and fibroids  Impression: Status post abdominal supracervical hysterectomy with removal of both fallopian tubes Preservation of both  ovaries   Plan: Follow-up next Tuesday to remove the rest of her staples Refill in her Percocet today  Follow up: 5  days  Meds ordered this encounter  Medications  . oxyCODONE-acetaminophen (PERCOCET/ROXICET) 5-325 MG tablet    Sig: Take 1-2 tablets by mouth every 4 (four) hours as needed (moderate to severe pain (when tolerating fluids)).    Dispense:  30 tablet    Refill:  0    Florian Buff, MD

## 2016-09-11 ENCOUNTER — Encounter: Payer: Self-pay | Admitting: Obstetrics & Gynecology

## 2016-09-11 ENCOUNTER — Ambulatory Visit (INDEPENDENT_AMBULATORY_CARE_PROVIDER_SITE_OTHER): Payer: Medicare Other | Admitting: Obstetrics & Gynecology

## 2016-09-11 VITALS — BP 120/70 | HR 72 | Wt 188.0 lb

## 2016-09-11 DIAGNOSIS — Z9889 Other specified postprocedural states: Secondary | ICD-10-CM

## 2016-09-11 DIAGNOSIS — Z90710 Acquired absence of both cervix and uterus: Secondary | ICD-10-CM

## 2016-09-11 MED ORDER — OXYCODONE-ACETAMINOPHEN 5-325 MG PO TABS: 1.0000 | ORAL_TABLET | ORAL | 0 refills | Status: DC | PRN

## 2016-09-11 NOTE — Progress Notes (Signed)
  HPI: Patient returns for routine postoperative follow-up having undergone supracervical abdominal hysterectomy with removal of both fallopian tubes on 08/29/2016.  The patient's immediate postoperative recovery has been unremarkable. Since hospital discharge the patient reports no vaginal discharge or bleeding Her incision is clean dry and intact the remainder of her staples were removed.   Current Outpatient Prescriptions: ibuprofen (ADVIL,MOTRIN) 800 MG tablet, Take 1 tablet (800 mg total) by mouth every 8 (eight) hours as needed. (Patient taking differently: Take 800 mg by mouth 2 (two) times daily as needed for moderate pain. ), Disp: 90 tablet, Rfl: 1 ondansetron (ZOFRAN) 8 MG tablet, Take 1 tablet (8 mg total) by mouth every 6 (six) hours as needed for nausea., Disp: 20 tablet, Rfl: 0 oxyCODONE-acetaminophen (PERCOCET/ROXICET) 5-325 MG tablet, Take 1-2 tablets by mouth every 4 (four) hours as needed (moderate to severe pain (when tolerating fluids))., Disp: 30 tablet, Rfl: 0 RESTASIS MULTIDOSE 0.05 % ophthalmic emulsion, Place 1 drop into both eyes 2 (two) times daily., Disp: , Rfl:  aspirin-acetaminophen-caffeine (EXCEDRIN MIGRAINE) 250-250-65 MG tablet, Take 2 tablets by mouth daily as needed for headache., Disp: , Rfl:   No current facility-administered medications for this visit.     Blood pressure 120/70, pulse 72, weight 188 lb (85.3 kg), last menstrual period 08/10/2016.  Physical Exam: Bowel movements have normalized Voiding without difficulty  Diagnostic Tests:   Pathology: benign  Impression: S/p abdominal supracervical hysterectomy removal of both fallopian tubes  Plan:   Follow up: 4  weeks  Florian Buff, MD

## 2016-10-09 ENCOUNTER — Encounter: Payer: Self-pay | Admitting: Obstetrics & Gynecology

## 2016-10-09 ENCOUNTER — Ambulatory Visit (INDEPENDENT_AMBULATORY_CARE_PROVIDER_SITE_OTHER): Payer: Medicare Other | Admitting: Obstetrics & Gynecology

## 2016-10-09 VITALS — BP 138/100 | HR 80 | Ht 63.0 in | Wt 186.5 lb

## 2016-10-09 DIAGNOSIS — Z90710 Acquired absence of both cervix and uterus: Secondary | ICD-10-CM

## 2016-10-09 DIAGNOSIS — Z9889 Other specified postprocedural states: Secondary | ICD-10-CM

## 2016-10-09 NOTE — Progress Notes (Signed)
  HPI: Patient returns for routine postoperative follow-up having undergone supracervical abdominal hysterectomy on 08/29/2016.  The patient's immediate postoperative recovery has been unremarkable. Since hospital discharge the patient reports no problems.   Current Outpatient Prescriptions: aspirin-acetaminophen-caffeine (EXCEDRIN MIGRAINE) 250-250-65 MG tablet, Take 2 tablets by mouth daily as needed for headache., Disp: , Rfl:  ibuprofen (ADVIL,MOTRIN) 800 MG tablet, Take 1 tablet (800 mg total) by mouth every 8 (eight) hours as needed. (Patient taking differently: Take 800 mg by mouth 2 (two) times daily as needed for moderate pain. ), Disp: 90 tablet, Rfl: 1 RESTASIS MULTIDOSE 0.05 % ophthalmic emulsion, Place 1 drop into both eyes 2 (two) times daily., Disp: , Rfl:  oxyCODONE-acetaminophen (PERCOCET/ROXICET) 5-325 MG tablet, Take 1-2 tablets by mouth every 4 (four) hours as needed (moderate to severe pain (when tolerating fluids))., Disp: 15 tablet, Rfl: 0  No current facility-administered medications for this visit.     Blood pressure (!) 138/100, pulse 80, height 5\' 3"  (1.6 m), weight 186 lb 8 oz (84.6 kg), last menstrual period 08/10/2016.  Physical Exam: Abdomen soft normal post op Incision clean dry intact  Diagnostic Tests:   Pathology: benign  Impression: Normal post op course  Plan:   Follow up: 2  years  Florian Buff, MD

## 2016-10-17 DIAGNOSIS — F4310 Post-traumatic stress disorder, unspecified: Secondary | ICD-10-CM | POA: Insufficient documentation

## 2016-10-17 DIAGNOSIS — F609 Personality disorder, unspecified: Secondary | ICD-10-CM | POA: Insufficient documentation

## 2016-10-17 DIAGNOSIS — F919 Conduct disorder, unspecified: Secondary | ICD-10-CM | POA: Insufficient documentation

## 2016-10-19 ENCOUNTER — Encounter: Payer: Self-pay | Admitting: Family Medicine

## 2016-10-19 ENCOUNTER — Ambulatory Visit (INDEPENDENT_AMBULATORY_CARE_PROVIDER_SITE_OTHER): Payer: Medicare Other | Admitting: Family Medicine

## 2016-10-19 VITALS — BP 116/74 | HR 82 | Temp 98.0°F | Ht 63.0 in | Wt 185.0 lb

## 2016-10-19 DIAGNOSIS — J0110 Acute frontal sinusitis, unspecified: Secondary | ICD-10-CM | POA: Diagnosis not present

## 2016-10-19 MED ORDER — FLUTICASONE PROPIONATE 50 MCG/ACT NA SUSP: 1.0000 | Freq: Two times a day (BID) | NASAL | 6 refills | Status: DC | PRN

## 2016-10-19 MED ORDER — AZITHROMYCIN 250 MG PO TABS: ORAL_TABLET | ORAL | 0 refills | Status: DC

## 2016-10-19 NOTE — Progress Notes (Signed)
BP 116/74   Pulse 82   Temp 98 F (36.7 C) (Oral)   Ht 5\' 3"  (1.6 m)   Wt 185 lb (83.9 kg)   LMP 08/10/2016   BMI 32.77 kg/m    Subjective:    Patient ID: Stacy Wolf, female    DOB: 1975-11-17, 41 y.o.   MRN: [id number]  HPI: Stacy Wolf is a 41 y.o. female presenting on 10/19/2016 for Sinusitis (sore throat, nasal congestion, x 2 days; using Nyquil)   HPI Sinus congestion and headache and pressure Patient is coming in today with complaints of sinus congestion and headache impression this been going on for the past 2 days. She has been using NyQuil but she feels like her cough and congestion and phlegm is keeping her up at night and she is woken up the past 2 nights and she just can't sleep as well. She denies any fevers or chills or sick contacts that she knows of. She was still smoking but hasn't smoked over the past couple days because of the irritation to her throat. We discussed possibly using this as a catalyst to quit smoking again. She denies any shortness of breath or wheezing or fevers or chills  Relevant past medical, surgical, family and social history reviewed and updated as indicated. Interim medical history since our last visit reviewed. Allergies and medications reviewed and updated.  Review of Systems  Constitutional: Negative for chills and fever.  HENT: Positive for congestion, postnasal drip, rhinorrhea, sinus pressure, sneezing and sore throat. Negative for ear discharge and ear pain.   Eyes: Negative for pain, redness and visual disturbance.  Respiratory: Positive for cough. Negative for chest tightness, shortness of breath and wheezing.   Cardiovascular: Negative for chest pain and leg swelling.  Genitourinary: Negative for difficulty urinating and dysuria.  Musculoskeletal: Negative for back pain and gait problem.  Skin: Negative for rash.  Neurological: Negative for light-headedness and headaches.  Psychiatric/Behavioral: Negative for agitation and  behavioral problems.  All other systems reviewed and are negative.   Per HPI unless specifically indicated above        Objective:    BP 116/74   Pulse 82   Temp 98 F (36.7 C) (Oral)   Ht 5\' 3"  (1.6 m)   Wt 185 lb (83.9 kg)   LMP 08/10/2016   BMI 32.77 kg/m   Wt Readings from Last 3 Encounters:  10/19/16 185 lb (83.9 kg)  10/09/16 186 lb 8 oz (84.6 kg)  09/11/16 188 lb (85.3 kg)    Physical Exam  Constitutional: She is oriented to person, place, and time. She appears well-developed and well-nourished. No distress.  HENT:  Right Ear: Tympanic membrane, external ear and ear canal normal.  Left Ear: Tympanic membrane, external ear and ear canal normal.  Nose: Mucosal edema and rhinorrhea present. No epistaxis. Right sinus exhibits frontal sinus tenderness. Right sinus exhibits no maxillary sinus tenderness. Left sinus exhibits frontal sinus tenderness. Left sinus exhibits no maxillary sinus tenderness.  Mouth/Throat: Uvula is midline and mucous membranes are normal. Posterior oropharyngeal edema and posterior oropharyngeal erythema present. No oropharyngeal exudate or tonsillar abscesses.  Eyes: Conjunctivae are normal.  Cardiovascular: Normal rate, regular rhythm, normal heart sounds and intact distal pulses.   No murmur heard. Pulmonary/Chest: Effort normal and breath sounds normal. No respiratory distress. She has no wheezes.  Musculoskeletal: Normal range of motion. She exhibits no edema or tenderness.  Neurological: She is alert and oriented to person, place, and time.  Coordination normal.  Skin: Skin is warm and dry. No rash noted. She is not diaphoretic.  Psychiatric: She has a normal mood and affect. Her behavior is normal.  Vitals reviewed.       Assessment & Plan:   Problem List Items Addressed This Visit    None    Visit Diagnoses    Acute non-recurrent frontal sinusitis    -  Primary   Relevant Medications   fluticasone (FLONASE) 50 MCG/ACT nasal spray     azithromycin (ZITHROMAX) 250 MG tablet       Follow up plan: Return if symptoms worsen or fail to improve.  Counseling provided for all of the vaccine components No orders of the defined types were placed in this encounter.   Caryl Pina, MD [facility] 10/19/2016, 4:01 PM

## 2016-10-26 DIAGNOSIS — F919 Conduct disorder, unspecified: Secondary | ICD-10-CM | POA: Diagnosis not present

## 2016-11-29 ENCOUNTER — Ambulatory Visit (INDEPENDENT_AMBULATORY_CARE_PROVIDER_SITE_OTHER): Payer: Medicare Other | Admitting: Family

## 2016-11-29 ENCOUNTER — Ambulatory Visit: Payer: Medicare Other

## 2016-11-29 ENCOUNTER — Encounter: Payer: Self-pay | Admitting: Family

## 2016-11-29 ENCOUNTER — Ambulatory Visit (INDEPENDENT_AMBULATORY_CARE_PROVIDER_SITE_OTHER): Payer: Medicare Other

## 2016-11-29 VITALS — BP 124/75 | HR 62 | Temp 97.3°F | Ht 63.0 in | Wt 188.0 lb

## 2016-11-29 DIAGNOSIS — S93401A Sprain of unspecified ligament of right ankle, initial encounter: Secondary | ICD-10-CM

## 2016-11-29 DIAGNOSIS — M25571 Pain in right ankle and joints of right foot: Secondary | ICD-10-CM

## 2016-11-29 MED ORDER — NAPROXEN 500 MG PO TABS: 500.0000 mg | ORAL_TABLET | Freq: Two times a day (BID) | ORAL | 1 refills | Status: DC

## 2016-11-29 NOTE — Progress Notes (Signed)
   Subjective:    Patient ID: Stacy Wolf, female    DOB: 1976-01-03, 41 y.o.   MRN: [id number]  Ankle Pain   The incident occurred 12 to 24 hours ago. The injury mechanism was a fall. The pain is present in the right ankle. The quality of the pain is described as aching. The pain is at a severity of 9/10. The pain is moderate. The pain has been constant since onset. Pertinent negatives include no inability to bear weight, numbness or tingling. She reports no foreign bodies present. The symptoms are aggravated by weight bearing. She has tried rest, NSAIDs and non-weight bearing for the symptoms. The treatment provided mild relief.      Review of Systems  Neurological: Negative for tingling and numbness.  All other systems reviewed and are negative.      Objective:   Physical Exam  Constitutional: She is oriented to person, place, and time. She appears well-developed and well-nourished. No distress.  HENT:  Head: Normocephalic.  Cardiovascular: Normal rate, regular rhythm, normal heart sounds and intact distal pulses.   No murmur heard. Pulmonary/Chest: Effort normal and breath sounds normal. No respiratory distress. She has no wheezes.  Abdominal: Soft. Bowel sounds are normal. She exhibits no distension. There is no tenderness.  Musculoskeletal: Normal range of motion. She exhibits tenderness. She exhibits no edema.  Tenderness and swelling in right lateral ankle, pain with flexion  Neurological: She is alert and oriented to person, place, and time.  Skin: Skin is warm and dry.  Psychiatric: She has a normal mood and affect. Her behavior is normal. Judgment and thought content normal.  Vitals reviewed.  X-ray- negative   BP 124/75   Pulse 62   Temp (!) 97.3 F (36.3 C) (Oral)   Ht 5\' 3"  (1.6 m)   Wt 188 lb (85.3 kg)   LMP 08/10/2016   BMI 33.30 kg/m      Assessment & Plan:  1. Right ankle pain, unspecified chronicity - DG Ankle Complete Right; Future - naproxen  (NAPROSYN) 500 MG tablet; Take 1 tablet (500 mg total) by mouth 2 (two) times daily with a meal.  Dispense: 60 tablet; Refill: 1  2. Sprain of right ankle, unspecified ligament, initial encounter Rest Ice ROM exercises  Keep elevated  RTO prn    Evelina Dun, FNP

## 2016-11-29 NOTE — Patient Instructions (Signed)
Ankle Sprain An ankle sprain is a stretch or tear in one of the tough, fiber-like tissues (ligaments) in the ankle. The ligaments in your ankle help to hold the bones of the ankle together. What are the causes? This condition is often caused by stepping on or falling on the outer edge of the foot. What increases the risk? This condition is more likely to develop in people who play sports. What are the signs or symptoms? Symptoms of this condition include:  Pain in your ankle.  Swelling.  Bruising. Bruising may develop right after you sprain your ankle or 1-2 days later.  Trouble standing or walking, especially when you turn or change directions. How is this diagnosed? This condition is diagnosed with a physical exam. During the exam, your health care provider will press on certain parts of your foot and ankle and try to move them in certain ways. X-rays may be taken to see how severe the sprain is and to check for broken bones. How is this treated? This condition may be treated with:  A brace. This is used to keep the ankle from moving until it heals.  An elastic bandage. This is used to support the ankle.  Crutches.  Pain medicine.  Surgery. This may be needed if the sprain is severe.  Physical therapy. This may help to improve the range of motion in the ankle. Follow these instructions at home:  Rest your ankle.  Take over-the-counter and prescription medicines only as told by your health care provider.  For 2-3 days, keep your ankle raised (elevated) above the level of your heart as much as possible.  If directed, apply ice to the area:  Put ice in a plastic bag.  Place a towel between your skin and the bag.  Leave the ice on for 20 minutes, 2-3 times a day.  If you were given a brace:  Wear it as directed.  Remove it to shower or bathe.  Try not to move your ankle much, but wiggle your toes from time to time. This helps to prevent swelling.  If you were  given an elastic bandage (dressing):  Remove it to shower or bathe.  Try not to move your ankle much, but wiggle your toes from time to time. This helps to prevent swelling.  Adjust the dressing to make it more comfortable if it feels too tight.  Loosen the dressing if you have numbness or tingling in your foot, or if your foot becomes cold and blue.  If you have crutches, use them as told by your health care provider. Continue to use them until you can walk without feeling pain in your ankle. Contact a health care provider if:  You have rapidly increasing bruising or swelling.  Your pain is not relieved with medicine. Get help right away if:  Your toes or foot becomes numb or blue.  You have severe pain that gets worse. This information is not intended to replace advice given to you by your health care provider. Make sure you discuss any questions you have with your health care provider. Document Released: 02/19/2005 Document Revised: 06/29/2015 Document Reviewed: 09/21/2014 Elsevier Interactive Patient Education  2017 [employer]  

## 2017-02-05 ENCOUNTER — Ambulatory Visit (INDEPENDENT_AMBULATORY_CARE_PROVIDER_SITE_OTHER): Payer: Medicare Other | Admitting: Family Medicine

## 2017-02-05 ENCOUNTER — Encounter: Payer: Self-pay | Admitting: Family Medicine

## 2017-02-05 VITALS — BP 121/83 | HR 74 | Temp 98.0°F | Ht 63.0 in | Wt 188.0 lb

## 2017-02-05 DIAGNOSIS — N760 Acute vaginitis: Secondary | ICD-10-CM

## 2017-02-05 DIAGNOSIS — R300 Dysuria: Secondary | ICD-10-CM | POA: Diagnosis not present

## 2017-02-05 DIAGNOSIS — Z01419 Encounter for gynecological examination (general) (routine) without abnormal findings: Secondary | ICD-10-CM | POA: Diagnosis not present

## 2017-02-05 DIAGNOSIS — B9689 Other specified bacterial agents as the cause of diseases classified elsewhere: Secondary | ICD-10-CM

## 2017-02-05 DIAGNOSIS — N898 Other specified noninflammatory disorders of vagina: Secondary | ICD-10-CM | POA: Diagnosis not present

## 2017-02-05 LAB — URINALYSIS, COMPLETE
Bilirubin, UA: NEGATIVE
Glucose, UA: NEGATIVE
Ketones, UA: NEGATIVE
Leukocytes, UA: NEGATIVE
Nitrite, UA: NEGATIVE
PH UA: 5.5 (ref 5.0–7.5)
Specific Gravity, UA: >1.03 — ABNORMAL HIGH (ref 1.005–1.030)
UUROB: 0.2 mg/dL (ref 0.2–1.0)

## 2017-02-05 LAB — WET PREP FOR TRICH, YEAST, CLUE
CLUE CELL EXAM: POSITIVE — AB
Trichomonas Exam: NEGATIVE
YEAST EXAM: NEGATIVE

## 2017-02-05 LAB — MICROSCOPIC EXAMINATION: Renal Epithel, UA: NONE SEEN /hpf

## 2017-02-05 MED ORDER — METRONIDAZOLE 500 MG PO TABS: 500.0000 mg | ORAL_TABLET | Freq: Two times a day (BID) | ORAL | 0 refills | Status: DC

## 2017-02-05 NOTE — Progress Notes (Signed)
BP 121/83   Pulse 74   Temp 98 F (36.7 C) (Oral)   Ht 5\' 3"  (1.6 m)   Wt 188 lb (85.3 kg)   LMP 08/10/2016   BMI 33.30 kg/m    Subjective:    Patient ID: Stacy Wolf, female    DOB: 28-Jul-1975, 41 y.o.   MRN: [id number]  HPI: Stacy Wolf is a 41 y.o. female presenting on 02/05/2017 for Gynecologic Exam   HPI Well woman exam with gynecological exam Patient is coming in for well woman exam and routine gynecological exam.  She says that she has been having dysuria vaginal discharge and irritation that is been going on for the past couple days.  She has been having clear vaginal discharge since her hysterectomy a few months ago.  She denies any abdominal pain or flank pain.  She denies any fevers or chills.  She does have urinary frequency and urgency.  She denies any breast issues or discharge or lumps.  She got her mammogram earlier this year already.  Patient has been having bilateral engorgement and tenderness and pelvic cramping for the past couple days that feels like her previous cycles and is starting to improve.  She says she has had this every month since having her partial hysterectomy  Relevant past medical, surgical, family and social history reviewed and updated as indicated. Interim medical history since our last visit reviewed. Allergies and medications reviewed and updated.  Review of Systems  Constitutional: Negative for chills and fever.  HENT: Negative for congestion, ear discharge, ear pain and tinnitus.   Eyes: Negative for pain, redness and visual disturbance.  Respiratory: Negative for cough, chest tightness, shortness of breath and wheezing.   Cardiovascular: Negative for chest pain, palpitations and leg swelling.  Gastrointestinal: Negative for abdominal pain, blood in stool, constipation and diarrhea.  Genitourinary: Positive for dysuria, frequency, vaginal discharge and vaginal pain. Negative for decreased urine volume, difficulty urinating, hematuria,  pelvic pain, urgency and vaginal bleeding.  Musculoskeletal: Negative for back pain, gait problem and myalgias.  Skin: Negative for rash.  Neurological: Negative for dizziness, weakness, light-headedness and headaches.  Psychiatric/Behavioral: Negative for agitation, behavioral problems and suicidal ideas.  All other systems reviewed and are negative.   Per HPI unless specifically indicated above        Objective:    BP 121/83   Pulse 74   Temp 98 F (36.7 C) (Oral)   Ht 5\' 3"  (1.6 m)   Wt 188 lb (85.3 kg)   LMP 08/10/2016   BMI 33.30 kg/m   Wt Readings from Last 3 Encounters:  02/05/17 188 lb (85.3 kg)  11/29/16 188 lb (85.3 kg)  10/19/16 185 lb (83.9 kg)    Physical Exam  Constitutional: She is oriented to person, place, and time. She appears well-developed and well-nourished. No distress.  Eyes: Conjunctivae are normal.  Neck: Neck supple. No thyromegaly present.  Cardiovascular: Normal rate, regular rhythm, normal heart sounds and intact distal pulses.  No murmur heard. Pulmonary/Chest: Effort normal and breath sounds normal. No respiratory distress. She has no wheezes. She has no rales. Right breast exhibits no inverted nipple, no mass, no nipple discharge, no skin change and no tenderness. Left breast exhibits no inverted nipple, no mass, no nipple discharge, no skin change and no tenderness. Breasts are symmetrical.  Abdominal: Soft. Bowel sounds are normal. She exhibits no distension. There is no tenderness. There is no rebound and no guarding.  Genitourinary: No breast swelling,  tenderness, discharge or bleeding. Pelvic exam was performed with patient supine. There is no rash or lesion on the right labia. There is no rash or lesion on the left labia. Right adnexum displays no mass and no tenderness. Left adnexum displays no mass and no tenderness. No erythema, tenderness or bleeding in the vagina. Vaginal discharge (Blind pouch with white thin discharge) found.    Genitourinary Comments: Cervix and uterus are surgically absent  Musculoskeletal: Normal range of motion. She exhibits no edema.  Lymphadenopathy:    She has no cervical adenopathy.    She has no axillary adenopathy.  Neurological: She is alert and oriented to person, place, and time. Coordination normal.  Skin: Skin is warm and dry. No rash noted. She is not diaphoretic.  Psychiatric: She has a normal mood and affect. Her behavior is normal.  Nursing note and vitals reviewed.   Wet prep: Clue cells positive  Urinalysis: 0-5 WBCs, 0-2 RBCs, 0-10 epithelial cells, mucus present, few bacteria    Assessment & Plan:   Problem List Items Addressed This Visit    None    Visit Diagnoses    Well woman exam with routine gynecological exam    -  Primary   Dysuria       Likely due to vaginal irritation based on urinalysis   Relevant Orders   Urinalysis, Complete   Bacterial vaginosis       Relevant Medications   metroNIDAZOLE (FLAGYL) 500 MG tablet   Other Relevant Orders   WET PREP FOR [place], YEAST, CLUE       Follow up plan: Return in about 6 months (around 08/06/2017), or if symptoms worsen or fail to improve, for Cholesterol and hypertension recheck.  Counseling provided for all of the vaccine components Orders Placed This Encounter  Procedures  . WET PREP FOR [place], YEAST, CLUE  . Urinalysis, Complete    Caryl Pina, MD [facility] 02/05/2017, 10:35 AM

## 2017-02-07 NOTE — Addendum Note (Signed)
Addended by: Caryl Pina on: 02/07/2017 07:56 AM   Modules accepted: Level of Service

## 2017-04-11 DIAGNOSIS — N3001 Acute cystitis with hematuria: Secondary | ICD-10-CM | POA: Diagnosis not present

## 2017-04-17 ENCOUNTER — Ambulatory Visit (INDEPENDENT_AMBULATORY_CARE_PROVIDER_SITE_OTHER): Payer: Medicare Other

## 2017-04-17 ENCOUNTER — Encounter: Payer: Self-pay | Admitting: Family Medicine

## 2017-04-17 ENCOUNTER — Ambulatory Visit (INDEPENDENT_AMBULATORY_CARE_PROVIDER_SITE_OTHER): Payer: Medicare Other | Admitting: Family Medicine

## 2017-04-17 VITALS — BP 127/85 | HR 70 | Temp 99.1°F | Ht 63.0 in | Wt 189.0 lb

## 2017-04-17 DIAGNOSIS — M546 Pain in thoracic spine: Secondary | ICD-10-CM

## 2017-04-17 DIAGNOSIS — M545 Low back pain: Secondary | ICD-10-CM | POA: Diagnosis not present

## 2017-04-17 DIAGNOSIS — B3731 Acute candidiasis of vulva and vagina: Secondary | ICD-10-CM

## 2017-04-17 DIAGNOSIS — B373 Candidiasis of vulva and vagina: Secondary | ICD-10-CM | POA: Diagnosis not present

## 2017-04-17 DIAGNOSIS — R319 Hematuria, unspecified: Secondary | ICD-10-CM | POA: Diagnosis not present

## 2017-04-17 LAB — URINALYSIS, COMPLETE
Bilirubin, UA: NEGATIVE
GLUCOSE, UA: NEGATIVE
Nitrite, UA: NEGATIVE
SPEC GRAV UA: 1.02 (ref 1.005–1.030)
Urobilinogen, Ur: 2 mg/dL — ABNORMAL HIGH (ref 0.2–1.0)
pH, UA: 6.5 (ref 5.0–7.5)

## 2017-04-17 LAB — MICROSCOPIC EXAMINATION
Bacteria, UA: NONE SEEN
Epithelial Cells (non renal): >10 /hpf — AB (ref 0–10)
Renal Epithel, UA: NONE SEEN /hpf

## 2017-04-17 MED ORDER — FLUCONAZOLE 150 MG PO TABS: 150.0000 mg | ORAL_TABLET | Freq: Once | ORAL | 0 refills | Status: AC

## 2017-04-17 MED ORDER — NAPROXEN 500 MG PO TABS
500.0000 mg | ORAL_TABLET | Freq: Two times a day (BID) | ORAL | 0 refills | Status: DC
Start: 2017-04-17 — End: 2017-04-25

## 2017-04-17 MED ORDER — CEPHALEXIN 500 MG PO CAPS: 500.0000 mg | ORAL_CAPSULE | Freq: Four times a day (QID) | ORAL | 0 refills | Status: AC

## 2017-04-17 NOTE — Progress Notes (Signed)
Subjective: CC: right sided thoracic back pain PCP: Dettinger, Fransisca Kaufmann, MD PYK:DXIPJA Carlean Jews Straley is a 42 y.o. female presenting to clinic today for:  1. Thoracic back pain Patient reports onset of right sided thoracic back pain that radiates down into her right side of the low back, a few days ago.  She notes that she was actually diagnosed and treated for a urinary tract infection at urgent care with Macrobid and Pyridium.  She is nearing the end of the [place].  She denies fevers, chills, nausea, vomiting.  No SOB, hemoptysis.  No preceding injury.  She does note associated vaginal itching and discharge as below.  2. Vaginal Discharge Patient reports that discharge started about 3 days ago.  She notes that discharge appears white and thick.  She denies vaginal odors.  She reports associated vaginal itching.  She reports recent use of antibiotics as above.  LMP June of last year.  She has had a total hysterectomy.  ROS: Per HPI  Allergies  Allergen Reactions  . Imitrex [Sumatriptan] Anaphylaxis    Could not swallow after taking med  . Latuda [Lurasidone Hcl]     Suicidal thoughts   . Wellbutrin [Bupropion] Other (See Comments)    Seizures / lowered seizure threshold  . Zoloft [Sertraline Hcl] Hives  . Haloperidol And Related     Leg cramps  . Nicoderm [Nicotine] Dermatitis   Past Medical History:  Diagnosis Date  . Anxiety   . Bipolar 1 disorder ([place])   . Heart murmur   . Migraines   . Schizophrenia ([place])     Current Outpatient Medications:  .  aspirin-acetaminophen-caffeine (EXCEDRIN MIGRAINE) 250-250-65 MG tablet, Take 2 tablets by mouth daily as needed for headache., Disp: , Rfl:  .  fluconazole (DIFLUCAN) 150 MG tablet, Take 1 tablet (150 mg total) by mouth once for 1 dose., Disp: 1 tablet, Rfl: 0 .  fluticasone (FLONASE) 50 MCG/ACT nasal spray, Place 1 spray into both nostrils 2 (two) times daily as needed for allergies or rhinitis. (Patient not taking: Reported on  04/17/2017), Disp: 16 g, Rfl: 6 Social History   Socioeconomic History  . Marital status: Legally Separated    Spouse name: Not on file  . Number of children: Not on file  . Years of education: Not on file  . Highest education level: Not on file  Social Needs  . Financial resource strain: Not on file  . Food insecurity - worry: Not on file  . Food insecurity - inability: Not on file  . Transportation needs - medical: Not on file  . Transportation needs - non-medical: Not on file  Occupational History  . Not on file  Tobacco Use  . Smoking status: Current Every Day Smoker    Packs/day: 0.00    Years: 7.00    Pack years: 0.00    Types: Cigarettes  . Smokeless tobacco: Never Used  . Tobacco comment: smokes 2 cig daily  Substance and Sexual Activity  . Alcohol use: No  . Drug use: Yes    Types: Marijuana    Comment: once a week  . Sexual activity: Not Currently    Birth control/protection: Surgical    Comment: hyst  Other Topics Concern  . Not on file  Social History Narrative  . Not on file   Family History  Problem Relation Age of Onset  . Cancer Mother 42       breast CA at 53 and uterine CA at 18  .  Hypertension Mother   . Kidney disease Mother        kidney transplant  . Cancer Sister 63       uterine  . Cancer Sister 66       uterine  . Diabetes Father   . Liver disease Father   . Cancer Maternal Aunt        breast    Objective: Office vital signs reviewed. BP 127/85   Pulse 70   Temp 99.1 F (37.3 C) (Oral)   Ht 5\' 3"  (1.6 m)   Wt 189 lb (85.7 kg)   LMP 08/10/2016   BMI 33.48 kg/m   Physical Examination:  General: Awake, alert, well nourished, nontoxic appearing, No acute distress Cardio: regular rate and rhythm, S1S2 heard, no murmurs appreciated Pulm: no wheeze, normal work of breathing on room air MSK: +CVA TTP on right.  She has full active range of motion.  She ambulates independently without difficulty. Neuro: No focal neurologic  deficits.  Results for orders placed or performed in visit on 04/17/17 (from the past 24 hour(s))  Urinalysis, Complete     Status: Abnormal   Collection Time: 04/17/17  4:44 PM  Result Value Ref Range   Specific Gravity, UA 1.020 1.005 - 1.030   pH, UA 6.5 5.0 - 7.5   Color, UA Yellow Yellow   Appearance Ur Clear Clear   Leukocytes, UA Trace (A) Negative   Protein, UA 1+ (A) Negative/Trace   Glucose, UA Negative Negative   Ketones, UA Trace (A) Negative   RBC, UA Trace (A) Negative   Bilirubin, UA Negative Negative   Urobilinogen, Ur 2.0 (H) 0.2 - 1.0 mg/dL   Nitrite, UA Negative Negative   Microscopic Examination See below:    Narrative   Performed at:  [facility] [address] [place], [place]  [id number] Lab Director: Colletta Maryland [facility], Phone:  [phone]  Microscopic Examination     Status: Abnormal   Collection Time: 04/17/17  4:44 PM  Result Value Ref Range   WBC, UA 0-5 0 - 5 /hpf   RBC, UA 0-2 0 - 2 /hpf   Epithelial Cells (non renal) >10 (A) 0 - 10 /hpf   Renal Epithel, UA None seen None seen /hpf   Mucus, UA Present Not Estab.   Bacteria, UA None seen None seen/Few   Narrative   Performed at:  [address] [place], [place]  [id number] Lab Director: Colletta Maryland [facility], Phone:  [phone]     Assessment/ Plan: 42 y.o. female   1. Acute right-sided thoracic back pain Patient is mildly febrile to 99.1 F.  Her urinalysis was remarkable for trace lysed blood, ketones, 1+ protein, trace leukocytes.  KUB was obtained to evaluate for possible renal stone.  There are no visible stones appreciated on personal review and this was reinforced by radiologist review.  Given her CVA tenderness, I have chosen to treat her with cephalosporin for the next 7 days to cover for possible persistent infection.  Keflex 500 mg p.o. 4 times daily for 7 days prescribed.  Home care instructions were reviewed with the patient.  Reasons for  emergent evaluation in the emergency department reviewed.  Differential diagnosis still includes renal stone that may just simply not be radiopaque.  Muscle spasm also considered.  However, given persistent urinary symptoms I think it is reasonable to treat for ongoing urinary tract infection. - Urinalysis, Complete - DG Abd 1 View; Future  2. Yeast vaginitis Diflucan 150 mg p.o. x1.   Orders Placed This Encounter  Procedures  . DG Abd 1 View    Standing Status:   Future    Number of Occurrences:   1    Standing Expiration Date:   06/17/2018    Order Specific Question:   Reason for Exam (SYMPTOM  OR DIAGNOSIS REQUIRED)    Answer:   right sided back pain, hematuria.  check for renal stone    Order Specific Question:   Is the patient pregnant?    Answer:   No    Order Specific Question:   Preferred imaging location?    Answer:   Internal  . Urinalysis, Complete   Meds ordered this encounter  Medications  . fluconazole (DIFLUCAN) 150 MG tablet    Sig: Take 1 tablet (150 mg total) by mouth once for 1 dose.    Dispense:  1 tablet    Refill:  0  . naproxen (NAPROSYN) 500 MG tablet    Sig: Take 1 tablet (500 mg total) by mouth 2 (two) times daily with a meal.    Dispense:  30 tablet    Refill:  0  . cephALEXin (KEFLEX) 500 MG capsule    Sig: Take 1 capsule (500 mg total) by mouth 4 (four) times daily for 7 days.    Dispense:  28 capsule    Refill:  [place], DO [place] [phone]

## 2017-04-17 NOTE — Patient Instructions (Addendum)
Your urine did show trace blood.  For this reason, we had x-ray to look for kidney stones.  I did not see any kidney stones on my personal review.  Because you are still having urinary symptoms, I have prescribed you a different antibiotic to take called cephalexin.  Take this 4 times a day for the next week.  This will catch any infection that may be in the kidney.  As we discussed, if your symptoms worsen, you develop fever, worsening back pain, nausea, vomiting, gross blood in your urine please seek immediate medical attention in the emergency department.  I have prescribed you a pill for your yeast infection.  This is a single dose.

## 2017-04-24 ENCOUNTER — Ambulatory Visit: Payer: Medicare Other | Admitting: *Deleted

## 2017-04-25 ENCOUNTER — Ambulatory Visit (INDEPENDENT_AMBULATORY_CARE_PROVIDER_SITE_OTHER): Payer: Medicare Other | Admitting: *Deleted

## 2017-04-25 VITALS — BP 114/81 | HR 66 | Ht 63.0 in | Wt 187.2 lb

## 2017-04-25 DIAGNOSIS — Z0000 Encounter for general adult medical examination without abnormal findings: Secondary | ICD-10-CM

## 2017-04-25 NOTE — Progress Notes (Signed)
Subjective:   Stacy Wolf is a 42 y.o. female who presents for Medicare Annual (Subsequent) preventive examination. Patient states that she has not worked since 2011 and her last place of employment was [employer]. Patient states that she enjoys playing games on her phone. Patient states her diet is semi-healthy. Patient is not involved int the community or church. Patient lives with her boyfriend. She has 3 adult children. She does have a dog in the home, and rugs. Fall hazards were discussed with patient. Patient has hand surgery and a hysterectomy within the last year. Patient states that she feels like her health is worse due to recent back pain.   Review of Systems:   Cardiac Risk Factors include: smoking/ tobacco exposure;dyslipidemia     Objective:     Vitals: BP 114/81   Pulse 66   Ht 5\' 3"  (1.6 m)   Wt 187 lb 3.2 oz (84.9 kg)   LMP 08/10/2016   BMI 33.16 kg/m   Body mass index is 33.16 kg/m.  Advanced Directives 10/09/2016 08/29/2016 08/29/2016 08/23/2016 06/14/2016 06/12/2016 06/06/2016  Does Patient Have a Medical Advance Directive? No No No No No No No  Would patient like information on creating a medical advance directive? - No - Patient declined No - Patient declined No - Patient declined No - Patient declined No - Patient declined No - Patient declined    Tobacco Social History   Tobacco Use  Smoking Status Current Every Day Smoker  . Packs/day: 0.00  . Years: 7.00  . Pack years: 0.00  . Types: Cigarettes  Smokeless Tobacco Never Used  Tobacco Comment   smokes 2 cig daily     Ready to quit: Not Answered Counseling given: Not Answered Comment: smokes 2 cig daily Patiet still smokes and is not ready to quit   Clinical Intake:   Past Medical History:  Diagnosis Date  . Anxiety   . Bipolar 1 disorder ([place])   . Heart murmur   . Migraines   . Schizophrenia [facility])    Past Surgical History:  Procedure Laterality Date  . arm surgery Right    tendonitis-wrist  .  BILATERAL SALPINGECTOMY Bilateral 08/29/2016   Procedure: BILATERAL SALPINGECTOMY;  Surgeon: Florian Buff, MD;  Location: [facility];  Service: Gynecology;  Laterality: Bilateral;  . CARPAL TUNNEL RELEASE Left 04/11/2016   Procedure: CARPAL TUNNEL RELEASE;  Surgeon: [name], MD;  Location: [facility];  Service: Orthopedics;  Laterality: Left;  . SUPRACERVICAL ABDOMINAL HYSTERECTOMY N/A 08/29/2016   Procedure: HYSTERECTOMY SUPRACERVICAL ABDOMINAL;  Surgeon: Florian Buff, MD;  Location: [facility];  Service: Gynecology;  Laterality: N/A;  . TUBAL LIGATION     Family History  Problem Relation Age of Onset  . Cancer Mother 40       breast CA at 28 and uterine CA at 73  . Hypertension Mother   . Kidney disease Mother        kidney transplant  . Cancer Sister 47       uterine  . Cancer Sister 64       uterine  . Diabetes Father   . Liver disease Father   . Cancer Maternal Aunt        breast   Social History   Socioeconomic History  . Marital status: Divorced    Spouse name: None  . Number of children: 3  . Years of education: None  . Highest education level: 11th grade  Social Needs  .  Financial resource strain: Not hard at all  . Food insecurity - worry: Never true  . Food insecurity - inability: Never true  . Transportation needs - medical: No  . Transportation needs - non-medical: No  Occupational History  . None  Tobacco Use  . Smoking status: Current Every Day Smoker    Packs/day: 0.00    Years: 7.00    Pack years: 0.00    Types: Cigarettes  . Smokeless tobacco: Never Used  . Tobacco comment: smokes 2 cig daily  Substance and Sexual Activity  . Alcohol use: No  . Drug use: Yes    Types: Marijuana    Comment: once a week  . Sexual activity: Yes    Birth control/protection: Surgical    Comment: hyst  Other Topics Concern  . None  Social History Narrative  . None    Outpatient Encounter Medications as of 04/25/2017  Medication Sig  .  aspirin-acetaminophen-caffeine (EXCEDRIN MIGRAINE) 250-250-65 MG tablet Take 2 tablets by mouth daily as needed for headache.  . [DISCONTINUED] fluticasone (FLONASE) 50 MCG/ACT nasal spray Place 1 spray into both nostrils 2 (two) times daily as needed for allergies or rhinitis. (Patient not taking: Reported on 04/17/2017)  . [DISCONTINUED] naproxen (NAPROSYN) 500 MG tablet Take 1 tablet (500 mg total) by mouth 2 (two) times daily with a meal.   No facility-administered encounter medications on file as of 04/25/2017.     Activities of Daily Living In your present state of health, do you have any difficulty performing the following activities: 04/25/2017 08/29/2016  Hearing? N -  Vision? N -  Comment wears glasses  -  Difficulty concentrating or making decisions? - -  Walking or climbing stairs? N -  Dressing or bathing? N -  Doing errands, shopping? N N  Preparing Food and eating ? N -  Using the Toilet? N -  In the past six months, have you accidently leaked urine? N -  Managing your Medications? N -  Managing your Finances? N -  Housekeeping or managing your Housekeeping? N -  Some recent data might be hidden  Patient does wear glasses and has no problems with vision while wearing.   Patient Care Team: Dettinger, Fransisca Kaufmann, MD as PCP - General (Family Medicine)    Assessment:   This is a routine wellness examination for Stacy Wolf.  Exercise Activities and Dietary recommendations    Goals    . Exercise 3x per week (30 min per time)    . Weight < 150 lb (68.04 kg)       Fall Risk Fall Risk  04/23/2016 01/16/2016 12/09/2015 04/18/2015 02/02/2015  Falls in the past year? No No No No No   Is the patient's home free of loose throw rugs in walkways, pet beds, electrical cords, etc?   yes      Grab bars in the bathroom? No      Handrails on the stairs?   Patient does not have stairs in the home      Adequate lighting?   yes  Timed Get Up and Go performed:   Depression Screen PHQ 2/9  Scores 04/25/2017 02/05/2017 11/29/2016 10/19/2016  PHQ - 2 Score 0 0 0 0  PHQ- 9 Score - - - -  Exception Documentation - - - -    Cognitive Function MMSE - Mini Mental State Exam 04/25/2017 04/23/2016 04/18/2015  Orientation to time 4 5 5   Orientation to Place 5 5 5   Registration 3 3 3  Attention/ Calculation 5 4 5   Recall 2 2 3   Language- name 2 objects 2 2 2   Language- repeat 1 1 1   Language- follow 3 step command 3 3 3   Language- read & follow direction 1 1 1   Write a sentence 1 1 1   Copy design 1 1 1   Total score 28 28 30   Patient scored a 28 out of 30  Immunization History  Administered Date(s) Administered  . Influenza,inj,Quad PF,6+ Mos 02/02/2015  . Tdap 08/25/2013    Qualifies for Shingles Vaccine?No  Screening Tests Health Maintenance  Topic Date Due  . INFLUENZA VACCINE  06/03/2017 (Originally 10/03/2016)  . MAMMOGRAM  05/30/2017  . PAP SMEAR  01/16/2019  . TETANUS/TDAP  08/26/2023  . HIV Screening  Completed    Cancer Screenings: Lung: Low Dose CT Chest recommended if Age 32-80 years, 30 pack-year currently smoking OR have quit w/in 15years. Patient does not qualify. Breast:  Up to date on Mammogram? Yes   Up to date of Bone Density/Dexa? Patient does not qualify for one yet Colorectal: Not due   Additional Screenings:  Hepatitis B/HIV/Syphillis: Hepatitis C Screening:      Plan:  Patient will make appointment with Dr. Warrick Parisian for follow up  Patient will have mammogram in Mach 2019. Patient will call and check on scheduling a eye exam   I have personally reviewed and noted the following in the patient's chart:   . Medical and social history . Use of alcohol, tobacco or illicit drugs  . Current medications and supplements . Functional ability and status . Nutritional status . Physical activity . Advanced directives . List of other physicians . Hospitalizations, surgeries, and ER visits in previous 12 months . Vitals . Screenings to include  cognitive, depression, and falls . Referrals and appointments  In addition, I have reviewed and discussed with patient certain preventive protocols, quality metrics, and best practice recommendations. A written personalized care plan for preventive services as well as general preventive health recommendations were provided to patient.     Gareth Morgan, LPN  [id number]

## 2017-04-25 NOTE — Patient Instructions (Signed)
  Ms. Greenhouse , Thank you for taking time to come for your Medicare Wellness Visit. I appreciate your ongoing commitment to your health goals. Please review the following plan we discussed and let me know if I can assist you in the future.   These are the goals we discussed: Goals    . Exercise 3x per week (30 min per time)    . Weight < 150 lb (68.04 kg)       This is a list of the screening recommended for you and due dates:  Health Maintenance  Topic Date Due  . Flu Shot  06/03/2017*  . Mammogram  05/30/2017  . Pap Smear  01/16/2019  . Tetanus Vaccine  08/26/2023  . HIV Screening  Completed  *Topic was postponed. The date shown is not the original due date.

## 2017-06-21 ENCOUNTER — Emergency Department (HOSPITAL_COMMUNITY)
Admission: EM | Admit: 2017-06-21 | Discharge: 2017-06-21 | Disposition: A | Discharge status: Home / Self Care | Payer: Medicare Other | Attending: Emergency Medicine | Admitting: Emergency Medicine

## 2017-06-21 ENCOUNTER — Encounter (HOSPITAL_COMMUNITY): Payer: Self-pay | Admitting: *Deleted

## 2017-06-21 ENCOUNTER — Other Ambulatory Visit: Payer: Self-pay

## 2017-06-21 DIAGNOSIS — F17210 Nicotine dependence, cigarettes, uncomplicated: Secondary | ICD-10-CM | POA: Diagnosis not present

## 2017-06-21 DIAGNOSIS — R197 Diarrhea, unspecified: Secondary | ICD-10-CM | POA: Insufficient documentation

## 2017-06-21 DIAGNOSIS — K219 Gastro-esophageal reflux disease without esophagitis: Secondary | ICD-10-CM

## 2017-06-21 DIAGNOSIS — R112 Nausea with vomiting, unspecified: Secondary | ICD-10-CM | POA: Diagnosis not present

## 2017-06-21 DIAGNOSIS — I10 Essential (primary) hypertension: Secondary | ICD-10-CM | POA: Diagnosis not present

## 2017-06-21 MED ORDER — PANTOPRAZOLE SODIUM 20 MG PO TBEC: 20.0000 mg | DELAYED_RELEASE_TABLET | Freq: Every day | ORAL | 0 refills | Status: DC

## 2017-06-21 MED ORDER — SUCRALFATE 1 G PO TABS: 1.0000 g | ORAL_TABLET | Freq: Three times a day (TID) | ORAL | 0 refills | Status: DC

## 2017-06-21 NOTE — Discharge Instructions (Addendum)
Take Protonix daily. Use Carafate as needed for epigastric burning. Try to stay well-hydrated with water. You may try imodium as needed for continued diarrhea. Be careful with your food choices over the next several days until diarrhea is relieved.  Try to avoid fatty, greasy, fried, dairy, or acidic foods.  Eat small, bland meals. Follow-up with your primary care doctor next week if your symptoms are not improving. Return to the emergency room if you develop high fevers, abdominal pain, blood in your stool.

## 2017-06-21 NOTE — ED Provider Notes (Signed)
[facility] EMERGENCY DEPARTMENT Provider Note   CSN: [id number] Arrival date & time: 06/21/17  7425     History   Chief Complaint Chief Complaint  Patient presents with  . Diarrhea    HPI Stacy Wolf is a 42 y.o. female presenting for evaluation of N/V/D.  Pt states that her sxs began 3 days ago. She had nausea and vomiting for the first 2 days, but this has resolved. She has reflux/GERD sxs now, not improved with Pepto bismol. Pt states she is having >10 stools a day, they are non bloody. Prompted by oral intake. She denies fevers, chills, CP, SOB, abd pain, and urinary sxs. No h/o abd problems or surgeries. No one else at home, no other sick contacts. She has not taken anything for her sxs. She denies other medical problems, take no other medications daily. She denies recent travel or new foods. She denies smoking, etoh use, or other drug use.   HPI  Past Medical History:  Diagnosis Date  . Anxiety   . Bipolar 1 disorder ([place])   . Heart murmur   . Migraines   . Schizophrenia [facility])     Patient Active Problem List   Diagnosis Date Noted  . Posttraumatic stress disorder 10/17/2016  . Personality disorder ([place]) 10/17/2016  . Conduct disorder 10/17/2016  . S/P laparoscopic supracervical hysterectomy 08/29/2016  . Menorrhagia with irregular cycle 08/13/2016  . Obesity (BMI 30.0-34.9) 04/23/2016  . Metabolic syndrome 95/63/8756  . Carpal tunnel syndrome, left upper limb   . Essential hypertension, benign 01/16/2016  . Migraine headache 04/18/2015  . Tobacco abuse 02/02/2015  . Constipation 02/02/2015  . Gastroesophageal reflux disease without esophagitis 08/24/2014  . Hyperlipidemia LDL goal <100 09/14/2013  . Bipolar disorder ([place]) 08/25/2013    Past Surgical History:  Procedure Laterality Date  . arm surgery Right    tendonitis-wrist  . BILATERAL SALPINGECTOMY Bilateral 08/29/2016   Procedure: BILATERAL SALPINGECTOMY;  Surgeon: Florian Buff, MD;  Location: [facility]  ORS;  Service: Gynecology;  Laterality: Bilateral;  . CARPAL TUNNEL RELEASE Left 04/11/2016   Procedure: CARPAL TUNNEL RELEASE;  Surgeon: [name], MD;  Location: [facility] ORS;  Service: Orthopedics;  Laterality: Left;  . SUPRACERVICAL ABDOMINAL HYSTERECTOMY N/A 08/29/2016   Procedure: HYSTERECTOMY SUPRACERVICAL ABDOMINAL;  Surgeon: Florian Buff, MD;  Location: [facility] ORS;  Service: Gynecology;  Laterality: N/A;  . TUBAL LIGATION       OB History    Gravida  3   Para  3   Term  3   Preterm      AB      Living  3     SAB      TAB      Ectopic      Multiple      Live Births  3            Home Medications    Prior to Admission medications   Medication Sig Start Date End Date Taking? Authorizing Provider  aspirin-acetaminophen-caffeine (EXCEDRIN MIGRAINE) [id number] MG tablet Take 2 tablets by mouth daily as needed for headache.    [provider]  pantoprazole (PROTONIX) 20 MG tablet Take 1 tablet (20 mg total) by mouth daily. 06/21/17   Keoshia Steinmetz, PA-C  sucralfate (CARAFATE) 1 g tablet Take 1 tablet (1 g total) by mouth 4 (four) times daily -  with meals and at bedtime. 06/21/17   Pranit Owensby, PA-C    Family History Family History  Problem Relation  Age of Onset  . Cancer Mother 43       breast CA at 39 and uterine CA at 61  . Hypertension Mother   . Kidney disease Mother        kidney transplant  . Cancer Sister 57       uterine  . Cancer Sister 19       uterine  . Diabetes Father   . Liver disease Father   . Cancer Maternal Aunt        breast    Social History Social History   Tobacco Use  . Smoking status: Current Every Day Smoker    Packs/day: 0.50    Years: 7.00    Pack years: 3.50    Types: Cigarettes  . Smokeless tobacco: Never Used  . Tobacco comment: smokes 2 cig daily  Substance Use Topics  . Alcohol use: No  . Drug use: Yes    Types: Marijuana    Comment: once a week     Allergies   Imitrex [sumatriptan];  Latuda [lurasidone hcl]; Wellbutrin [bupropion]; Zoloft [sertraline hcl]; Haloperidol and related; and Nicoderm [nicotine]   Review of Systems Review of Systems  Constitutional: Negative for chills and fever.  HENT: Negative for congestion.   Eyes: Negative for pain.  Respiratory: Negative for cough and shortness of breath.   Cardiovascular: Negative for chest pain.  Gastrointestinal: Positive for diarrhea, nausea (resolved) and vomiting (resolved). Negative for abdominal distention, abdominal pain and blood in stool.  Genitourinary: Negative for dysuria, frequency and hematuria.  Musculoskeletal: Negative for back pain.  Skin: Negative for rash.  Allergic/Immunologic: Negative for immunocompromised state.  Neurological: Negative for dizziness and headaches.  Hematological: Does not bruise/bleed easily.     Physical Exam Updated Vital Signs BP 111/87 (BP Location: Left Arm)   Pulse 71   Temp 98.1 F (36.7 C) (Oral)   Resp 18   Ht 5\' 3"  (1.6 m)   Wt 84.8 kg (187 lb)   LMP 08/10/2016   SpO2 96%   BMI 33.13 kg/m   Physical Exam  Constitutional: She is oriented to person, place, and time. She appears well-developed and well-nourished. No distress.  Pt sitting comfortably in bed in NAD  HENT:  Head: Normocephalic and atraumatic.  MM moist  Eyes: Pupils are equal, round, and reactive to light. Conjunctivae and EOM are normal.  Neck: Normal range of motion. Neck supple.  Cardiovascular: Normal rate, regular rhythm and intact distal pulses.  Not tachycardic, ~75 in room   Pulmonary/Chest: Effort normal and breath sounds normal. No respiratory distress. She has no wheezes.  Abdominal: Soft. Bowel sounds are normal. She exhibits no distension and no mass. There is no tenderness. There is no rebound and no guarding.  No TTP of abd. Soft without rigidity, guarding or distention.   Musculoskeletal: Normal range of motion.  Neurological: She is alert and oriented to person, place,  and time.  Skin: Skin is warm and dry.  Psychiatric: She has a normal mood and affect.  Nursing note and vitals reviewed.    ED Treatments / Results  Labs (all labs ordered are listed, but only abnormal results are displayed) Labs Reviewed - No data to display  EKG None  Radiology No results found.  Procedures Procedures (including critical care time)  Medications Ordered in ED Medications - No data to display   Initial Impression / Assessment and Plan / ED Course  I have reviewed the triage vital signs and the nursing notes.  Pertinent labs & imaging results that were available during my care of the patient were reviewed by me and considered in my medical decision making (see chart for details).     Pt presenting for evaluation of N/V/D. Physical exam reassuring, pt without fevers and is not tachycardic. Appears nontoxic. No abd pain.  Does not appear significantly dehydrated.  No tachycardia, mucous membranes moist.  Likely viral etiology.  Nausea has resolved.  Discussed findings with patient.  Discussed option of lab work and IV fluids.  Patient states she would like to try symptomatic treatment and close monitoring.  At this time, doubt intra-abdominal infection, perforation, surgical abdomen, or obstruction.  Strict return precautions given including signs for infection.  Will treat GERD with Protonix and Carafate as needed.  Discussed use of Imodium.  Discussed importance of hydration.  At this time, patient appears safe for discharge.  Return precautions given.  Patient states she understands and agrees to plan.   Final Clinical Impressions(s) / ED Diagnoses   Final diagnoses:  Diarrhea, unspecified type  Gastroesophageal reflux disease, esophagitis presence not specified    ED Discharge Orders        Ordered    pantoprazole (PROTONIX) 20 MG tablet  Daily     06/21/17 1018    sucralfate (CARAFATE) 1 g tablet  3 times daily with meals & bedtime     06/21/17 1018         Rayana Geurin, PA-C 06/21/17 1501    Milton Ferguson, MD 06/25/17 0725

## 2017-06-21 NOTE — ED Triage Notes (Signed)
Diarrhea and nausea for 3 days. 2 days with vomiting, not vomiting x 1 day but still having nausea and diarrhea

## 2017-08-29 ENCOUNTER — Ambulatory Visit (INDEPENDENT_AMBULATORY_CARE_PROVIDER_SITE_OTHER): Payer: Medicare Other | Admitting: Family Medicine

## 2017-08-29 ENCOUNTER — Encounter: Payer: Self-pay | Admitting: Family Medicine

## 2017-08-29 VITALS — BP 138/87 | HR 70 | Temp 98.7°F | Ht 63.0 in | Wt 186.0 lb

## 2017-08-29 DIAGNOSIS — N3000 Acute cystitis without hematuria: Secondary | ICD-10-CM | POA: Diagnosis not present

## 2017-08-29 DIAGNOSIS — R350 Frequency of micturition: Secondary | ICD-10-CM | POA: Diagnosis not present

## 2017-08-29 DIAGNOSIS — K649 Unspecified hemorrhoids: Secondary | ICD-10-CM | POA: Diagnosis not present

## 2017-08-29 LAB — URINALYSIS, COMPLETE
Bilirubin, UA: NEGATIVE
GLUCOSE, UA: NEGATIVE
Ketones, UA: NEGATIVE
LEUKOCYTES UA: NEGATIVE
Nitrite, UA: NEGATIVE
PH UA: 7.5 (ref 5.0–7.5)
PROTEIN UA: NEGATIVE
Specific Gravity, UA: 1.02 (ref 1.005–1.030)
UUROB: 1 mg/dL (ref 0.2–1.0)

## 2017-08-29 LAB — MICROSCOPIC EXAMINATION: Renal Epithel, UA: NONE SEEN /hpf

## 2017-08-29 MED ORDER — CEPHALEXIN 500 MG PO CAPS
500.0000 mg | ORAL_CAPSULE | Freq: Four times a day (QID) | ORAL | 0 refills | Status: DC
Start: 2017-08-29 — End: 2017-11-11

## 2017-08-29 MED ORDER — HYDROCORTISONE ACETATE 25 MG RE SUPP: 25.0000 mg | Freq: Two times a day (BID) | RECTAL | 0 refills | Status: DC

## 2017-08-29 NOTE — Progress Notes (Signed)
BP 138/87   Pulse 70   Temp 98.7 F (37.1 C) (Oral)   Ht 5\' 3"  (1.6 m)   Wt 186 lb (84.4 kg)   LMP 08/10/2016   BMI 32.95 kg/m    Subjective:    Patient ID: Stacy Wolf, female    DOB: 10-01-1975, 42 y.o.   MRN: [id number]  HPI: Stacy Wolf is a 42 y.o. female presenting on 08/29/2017 for Abdominal Pain; Blood In Stools; and Urinary Frequency   HPI Blood streaks in stools Patient is coming in with complaints of blood streaks in stools.  She does admit that she was constipated last week but has since cleared that and is going more regularly and has had a couple episodes of blood-streaked stools over the past week.  She denies any major bleeding or blood in the stool or dark tarry stools.  Urinary frequency Patient has been having urinary frequency and dysuria that has been going on for the past few days.  She has lower abdominal pain as well and thinks she may have a bladder infection.  She denies any flank pain or fevers or chills.  Relevant past medical, surgical, family and social history reviewed and updated as indicated. Interim medical history since our last visit reviewed. Allergies and medications reviewed and updated.  Review of Systems  Constitutional: Negative for chills and fever.  Eyes: Negative for visual disturbance.  Respiratory: Negative for chest tightness and shortness of breath.   Cardiovascular: Negative for chest pain and leg swelling.  Gastrointestinal: Positive for abdominal pain, anal bleeding and constipation. Negative for abdominal distention, blood in stool, diarrhea and vomiting.  Genitourinary: Positive for dysuria, frequency and urgency. Negative for difficulty urinating, flank pain, hematuria, vaginal bleeding, vaginal discharge and vaginal pain.  Musculoskeletal: Negative for back pain and gait problem.  Skin: Negative for rash.  Neurological: Negative for light-headedness and headaches.  Psychiatric/Behavioral: Negative for agitation and  behavioral problems.  All other systems reviewed and are negative.   Per HPI unless specifically indicated above   Allergies as of 08/29/2017      Reactions   Imitrex [sumatriptan] Anaphylaxis   Could not swallow after taking med   Latuda [lurasidone Hcl]    Suicidal thoughts   Wellbutrin [bupropion] Other (See Comments)   Seizures / lowered seizure threshold   Zoloft [sertraline Hcl] Hives   Haloperidol And Related    Leg cramps   Nicoderm [nicotine] Dermatitis      Medication List        Accurate as of 08/29/17  4:38 PM. Always use your most recent med list.          aspirin-acetaminophen-caffeine 250-250-65 MG tablet Commonly known as:  EXCEDRIN MIGRAINE Take 2 tablets by mouth daily as needed for headache.          Objective:    BP 138/87   Pulse 70   Temp 98.7 F (37.1 C) (Oral)   Ht 5\' 3"  (1.6 m)   Wt 186 lb (84.4 kg)   LMP 08/10/2016   BMI 32.95 kg/m   Wt Readings from Last 3 Encounters:  08/29/17 186 lb (84.4 kg)  06/21/17 187 lb (84.8 kg)  04/25/17 187 lb 3.2 oz (84.9 kg)    Physical Exam  Constitutional: She is oriented to person, place, and time. She appears well-developed and well-nourished. No distress.  Eyes: Pupils are equal, round, and reactive to light. Conjunctivae and EOM are normal.  Cardiovascular: Normal rate, regular rhythm, normal heart  sounds and intact distal pulses.  No murmur heard. Pulmonary/Chest: Effort normal and breath sounds normal. No respiratory distress. She has no wheezes.  Abdominal: Soft. Normal appearance and bowel sounds are normal. There is no tenderness (No tenderness on exam). There is no rigidity, no rebound, no guarding and no CVA tenderness.  Musculoskeletal: Normal range of motion. She exhibits no edema or tenderness.  Neurological: She is alert and oriented to person, place, and time. Coordination normal.  Skin: Skin is warm and dry. No rash noted. She is not diaphoretic.  Psychiatric: She has a normal mood  and affect. Her behavior is normal.  Nursing note and vitals reviewed.   Results for orders placed or performed in visit on 08/29/17  Microscopic Examination  Result Value Ref Range   WBC, UA 0-5 0 - 5 /hpf   RBC, UA 3-10 (A) 0 - 2 /hpf   Epithelial Cells (non renal) 0-10 0 - 10 /hpf   Renal Epithel, UA None seen None seen /hpf   Bacteria, UA Few None seen/Few  Urinalysis, Complete  Result Value Ref Range   Specific Gravity, UA 1.020 1.005 - 1.030   pH, UA 7.5 5.0 - 7.5   Color, UA Yellow Yellow   Appearance Ur Clear Clear   Leukocytes, UA Negative Negative   Protein, UA Negative Negative/Trace   Glucose, UA Negative Negative   Ketones, UA Negative Negative   RBC, UA Trace (A) Negative   Bilirubin, UA Negative Negative   Urobilinogen, Ur 1.0 0.2 - 1.0 mg/dL   Nitrite, UA Negative Negative   Microscopic Examination See below:       Assessment & Plan:   Problem List Items Addressed This Visit    None    Visit Diagnoses    Acute cystitis without hematuria    -  Primary   Relevant Medications   cephALEXin (KEFLEX) 500 MG capsule   Other Relevant Orders   Urinalysis, Complete (Completed)   Hemorrhoids, unspecified hemorrhoid type       Relevant Medications   hydrocortisone (ANUSOL-HC) 25 MG suppository       Follow up plan: Return if symptoms worsen or fail to improve.  Counseling provided for all of the vaccine components Orders Placed This Encounter  Procedures  . Urinalysis, Complete    Caryl Pina, MD [facility] 08/29/2017, 4:38 PM

## 2017-11-11 ENCOUNTER — Encounter: Payer: Self-pay | Admitting: Family Medicine

## 2017-11-11 ENCOUNTER — Ambulatory Visit (INDEPENDENT_AMBULATORY_CARE_PROVIDER_SITE_OTHER): Payer: Medicare Other | Admitting: Family Medicine

## 2017-11-11 VITALS — BP 136/96 | HR 82 | Temp 97.6°F | Ht 63.0 in | Wt 185.0 lb

## 2017-11-11 DIAGNOSIS — R399 Unspecified symptoms and signs involving the genitourinary system: Secondary | ICD-10-CM | POA: Diagnosis not present

## 2017-11-11 DIAGNOSIS — R102 Pelvic and perineal pain: Secondary | ICD-10-CM

## 2017-11-11 LAB — URINALYSIS
Bilirubin, UA: NEGATIVE
Glucose, UA: NEGATIVE
KETONES UA: NEGATIVE
Leukocytes, UA: NEGATIVE
NITRITE UA: NEGATIVE
Specific Gravity, UA: 1.025 (ref 1.005–1.030)
UUROB: 1 mg/dL (ref 0.2–1.0)
pH, UA: 6 (ref 5.0–7.5)

## 2017-11-11 MED ORDER — CIPROFLOXACIN HCL 500 MG PO TABS: 500.0000 mg | ORAL_TABLET | Freq: Two times a day (BID) | ORAL | 0 refills | Status: DC

## 2017-11-11 MED ORDER — FLUCONAZOLE 150 MG PO TABS: ORAL_TABLET | ORAL | 0 refills | Status: DC

## 2017-11-11 NOTE — Progress Notes (Signed)
Chief Complaint  Patient presents with  . Urinary Tract Infection    HPI  Patient presents today for burning with urination and frequency for two days. Denies fever . Has 6/10 left flank pain and left lower abd pain. Aching, constant. No nausea, vomiting.   PMH: Smoking status noted ROS: Review of Systems  Constitutional: Negative.   HENT: Negative.   Eyes: Negative for visual disturbance.  Respiratory: Negative for shortness of breath.   Cardiovascular: Negative for chest pain.  Gastrointestinal: Positive for abdominal pain.  Genitourinary: Positive for difficulty urinating and pelvic pain.  Musculoskeletal: Negative for arthralgias.     Objective: BP (!) 136/96   Pulse 82   Temp 97.6 F (36.4 C) (Oral)   Ht 5\' 3"  (1.6 m)   Wt 185 lb (83.9 kg)   LMP 08/10/2016   BMI 32.77 kg/m  Gen: NAD, alert, cooperative with exam HEENT: NCAT, EOMI, PERRL CV: RRR, good S1/S2, no murmur Resp: CTABL, no wheezes, non-labored BWL:SLHT, moderate LLQ tenderness near adnexa Ext: No edema, warm Neuro: Alert and oriented, No gross deficits  Assessment and plan:  1. UTI symptoms   2. Pelvic pain in female     Meds ordered this encounter  Medications  . ciprofloxacin (CIPRO) 500 MG tablet    Sig: Take 1 tablet (500 mg total) by mouth 2 (two) times daily.    Dispense:  20 tablet    Refill:  0  . fluconazole (DIFLUCAN) 150 MG tablet    Sig: Take 1 by mouth now and repeat in 5 days if symptoms persist.    Dispense:  2 tablet    Refill:  0    Orders Placed This Encounter  Procedures  . Urine Culture  . US Pelvis Complete    Standing Status:   Future    Standing Expiration Date:   01/12/2019    Order Specific Question:   Reason for Exam (SYMPTOM  OR DIAGNOSIS REQUIRED)    Answer:   pelvic pain    Order Specific Question:   Preferred imaging location?    Answer:   [facility]  . Urinalysis    Follow up as needed.  Claretta Fraise, MD

## 2017-11-13 ENCOUNTER — Other Ambulatory Visit: Payer: Self-pay | Admitting: Family Medicine

## 2017-11-13 LAB — URINE CULTURE

## 2017-11-13 MED ORDER — SULFAMETHOXAZOLE-TRIMETHOPRIM 800-160 MG PO TABS: 1.0000 | ORAL_TABLET | Freq: Two times a day (BID) | ORAL | 0 refills | Status: DC

## 2017-11-19 ENCOUNTER — Ambulatory Visit (HOSPITAL_COMMUNITY)
Admission: RE | Admit: 2017-11-19 | Discharge: 2017-11-19 | Disposition: A | Discharge status: Home / Self Care | Payer: Medicare Other | Source: Ambulatory Visit | Attending: Family Medicine | Admitting: Family Medicine

## 2017-11-19 DIAGNOSIS — R935 Abnormal findings on diagnostic imaging of other abdominal regions, including retroperitoneum: Secondary | ICD-10-CM | POA: Insufficient documentation

## 2017-11-19 DIAGNOSIS — R102 Pelvic and perineal pain: Secondary | ICD-10-CM

## 2017-11-20 ENCOUNTER — Other Ambulatory Visit: Payer: Self-pay | Admitting: *Deleted

## 2017-11-20 DIAGNOSIS — R9389 Abnormal findings on diagnostic imaging of other specified body structures: Secondary | ICD-10-CM

## 2017-12-12 ENCOUNTER — Ambulatory Visit (HOSPITAL_COMMUNITY): Payer: Medicare Other

## 2017-12-18 ENCOUNTER — Ambulatory Visit (HOSPITAL_COMMUNITY)
Admission: RE | Admit: 2017-12-18 | Discharge: 2017-12-18 | Disposition: A | Discharge status: Home / Self Care | Payer: Medicare Other | Source: Ambulatory Visit | Attending: Family Medicine | Admitting: Family Medicine

## 2017-12-18 DIAGNOSIS — R188 Other ascites: Secondary | ICD-10-CM | POA: Insufficient documentation

## 2017-12-18 DIAGNOSIS — R9389 Abnormal findings on diagnostic imaging of other specified body structures: Secondary | ICD-10-CM | POA: Insufficient documentation

## 2017-12-18 MED ORDER — IOPAMIDOL (ISOVUE-300) INJECTION 61%
100.0000 mL | Freq: Once | INTRAVENOUS | Status: AC | PRN
  Administered 2017-12-18: 100 mL via INTRAVENOUS

## 2017-12-20 ENCOUNTER — Other Ambulatory Visit: Payer: Self-pay | Admitting: Family Medicine

## 2017-12-20 DIAGNOSIS — N838 Other noninflammatory disorders of ovary, fallopian tube and broad ligament: Secondary | ICD-10-CM

## 2017-12-27 ENCOUNTER — Ambulatory Visit: Payer: Medicare Other | Admitting: Obstetrics & Gynecology

## 2017-12-31 ENCOUNTER — Encounter: Payer: Self-pay | Admitting: Obstetrics & Gynecology

## 2017-12-31 ENCOUNTER — Ambulatory Visit (INDEPENDENT_AMBULATORY_CARE_PROVIDER_SITE_OTHER): Payer: Medicare Other | Admitting: Obstetrics & Gynecology

## 2017-12-31 VITALS — BP 140/91 | HR 71 | Ht 63.0 in | Wt 196.0 lb

## 2017-12-31 DIAGNOSIS — K668 Other specified disorders of peritoneum: Secondary | ICD-10-CM | POA: Diagnosis not present

## 2017-12-31 NOTE — Progress Notes (Signed)
Chief Complaint  Patient presents with  . discuss [place] results      42 y.o. [id number] Patient's last menstrual period was 08/10/2016. The current method of family planning is status post hysterectomy.  Outpatient Encounter Medications as of 12/31/2017  Medication Sig  . aspirin-acetaminophen-caffeine (EXCEDRIN MIGRAINE) 250-250-65 MG tablet Take 2 tablets by mouth daily as needed for headache.  . [DISCONTINUED] fluconazole (DIFLUCAN) 150 MG tablet Take 1 by mouth now and repeat in 5 days if symptoms persist.  . [DISCONTINUED] hydrocortisone (ANUSOL-HC) 25 MG suppository Place 1 suppository (25 mg total) rectally 2 (two) times daily.  . [DISCONTINUED] sulfamethoxazole-trimethoprim (BACTRIM DS,SEPTRA DS) 800-160 MG tablet Take 1 tablet by mouth 2 (two) times daily.   No facility-administered encounter medications on file as of 12/31/2017.     Subjective Pt is 16 months s/p abdominal supracervical hysterectomy with severe adhesions Normal ovaries Had crampy pain like a period which worsened  Eventually got a CT and sonogram which reveals a fluid collection that I believe is a peritoneal inclusion cyst, a collection of peritoneal fluid which is created by intra peritoneal adhesions Reviewed the CT and sonogram Past Medical History:  Diagnosis Date  . Anxiety   . Bipolar 1 disorder ([place])   . Heart murmur   . Migraines   . Schizophrenia [facility])     Past Surgical History:  Procedure Laterality Date  . arm surgery Right    tendonitis-wrist  . BILATERAL SALPINGECTOMY Bilateral 08/29/2016   Procedure: BILATERAL SALPINGECTOMY;  Surgeon: Florian Buff, MD;  Location: [facility] ORS;  Service: Gynecology;  Laterality: Bilateral;  . CARPAL TUNNEL RELEASE Left 04/11/2016   Procedure: CARPAL TUNNEL RELEASE;  Surgeon: [name], MD;  Location: [facility] ORS;  Service: Orthopedics;  Laterality: Left;  . SUPRACERVICAL ABDOMINAL HYSTERECTOMY N/A 08/29/2016   Procedure: HYSTERECTOMY SUPRACERVICAL  ABDOMINAL;  Surgeon: Florian Buff, MD;  Location: [facility] ORS;  Service: Gynecology;  Laterality: N/A;  . TUBAL LIGATION      OB History    Gravida  3   Para  3   Term  3   Preterm      AB      Living  3     SAB      TAB      Ectopic      Multiple      Live Births  3           Allergies  Allergen Reactions  . Imitrex [Sumatriptan] Anaphylaxis    Could not swallow after taking med  . Latuda [Lurasidone Hcl]     Suicidal thoughts   . Wellbutrin [Bupropion] Other (See Comments)    Seizures / lowered seizure threshold  . Zoloft [Sertraline Hcl] Hives  . Haloperidol And Related     Leg cramps  . Nicoderm [Nicotine] Dermatitis    Social History   Socioeconomic History  . Marital status: Divorced    Spouse name: Not on file  . Number of children: 3  . Years of education: Not on file  . Highest education level: 11th grade  Occupational History  . Not on file  Social Needs  . Financial resource strain: Not hard at all  . Food insecurity:    Worry: Never true    Inability: Never true  . Transportation needs:    Medical: No    Non-medical: No  Tobacco Use  . Smoking status: Former Smoker    Packs/day: 0.50    Years:  7.00    Pack years: 3.50    Types: Cigarettes  . Smokeless tobacco: Never Used  . Tobacco comment: smokes 2 cig daily  Substance and Sexual Activity  . Alcohol use: Yes  . Drug use: Yes    Types: Marijuana    Comment: once a week  . Sexual activity: Yes    Birth control/protection: Surgical    Comment: hyst  Lifestyle  . Physical activity:    Days per week: 0 days    Minutes per session: 0 min  . Stress: Not at all  Relationships  . Social connections:    Talks on phone: More than three times a week    Gets together: Three times a week    Attends religious service: Never    Active member of club or organization: No    Attends meetings of clubs or organizations: Never    Relationship status: Divorced  Other Topics Concern  .  Not on file  Social History Narrative  . Not on file    Family History  Problem Relation Age of Onset  . Cancer Mother 47       breast CA at 73 and uterine CA at 88  . Hypertension Mother   . Kidney disease Mother        kidney transplant  . Cancer Sister 45       uterine  . Cancer Sister 33       uterine  . Diabetes Father   . Liver disease Father   . Cancer Maternal Aunt        breast    Medications:       Current Outpatient Medications:  .  aspirin-acetaminophen-caffeine (EXCEDRIN MIGRAINE) 250-250-65 MG tablet, Take 2 tablets by mouth daily as needed for headache., Disp: , Rfl:   Objective Blood pressure (!) 140/91, pulse 71, height 5\' 3"  (1.6 m), weight 196 lb (88.9 kg), last menstrual period 08/10/2016. Gen WDWN NAD Abdomen soft non tender completely benign  Pertinent ROS No burning with urination, frequency or urgency No nausea, vomiting or diarrhea Nor fever chills or other constitutional symptoms   Labs or studies Reviewed studies    Impression Diagnoses this Encounter::   ICD-10-CM   1. 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   Labs or Scans Ordered: Orders Placed This Encounter  Procedures  . US PELVIS (TRANSABDOMINAL ONLY)  . US PELVIS TRANSVANGINAL NON-OB (TV ONLY)    Management:: >re evaluate the probable peritoneal inclusion cyst in 4 months  Follow up Return in about 4 months (around 05/03/2018) for GYN sono, Follow up, with Dr Elonda Husky.        Face to face time:  15 minutes  Greater than 50% of the visit time was spent in counseling and coordination of care with the patient.  The summary and outline of the counseling and care coordination is summarized in the note above.   All questions were answered.

## 2018-01-08 ENCOUNTER — Encounter: Payer: Self-pay | Admitting: Family Medicine

## 2018-01-08 ENCOUNTER — Ambulatory Visit (INDEPENDENT_AMBULATORY_CARE_PROVIDER_SITE_OTHER): Payer: Medicare Other | Admitting: Family Medicine

## 2018-01-08 VITALS — BP 118/76 | HR 78 | Temp 97.4°F | Ht 63.0 in | Wt 195.2 lb

## 2018-01-08 DIAGNOSIS — G43009 Migraine without aura, not intractable, without status migrainosus: Secondary | ICD-10-CM

## 2018-01-08 DIAGNOSIS — I10 Essential (primary) hypertension: Secondary | ICD-10-CM | POA: Diagnosis not present

## 2018-01-08 MED ORDER — TOPIRAMATE 50 MG PO TABS: 50.0000 mg | ORAL_TABLET | Freq: Every day | ORAL | 1 refills | Status: DC

## 2018-01-08 NOTE — Progress Notes (Signed)
BP 118/76   Pulse 78   Temp (!) 97.4 F (36.3 C) (Oral)   Ht 5\' 3"  (1.6 m)   Wt 195 lb 3.2 oz (88.5 kg)   LMP 08/10/2016   BMI 34.58 kg/m    Subjective:    Patient ID: Stacy Wolf, female    DOB: 1975-06-24, 42 y.o.   MRN: [id number]  HPI: Stacy Wolf is a 42 y.o. female presenting on 01/08/2018 for Hypertension (x 2 months since patient has stopped smothing) and Headache (ongoing but has gotten worse)   HPI Hypertension Patient is currently on no medication and we have just been watching it more recently and she has been up and down and up and down, and their blood pressure today is 118/76. Patient denies any lightheadedness or dizziness. Patient denies headaches, blurred vision, chest pains, shortness of breath, or weakness. Denies any side effects from medication and is content with current medication.   Migraine headaches Patient comes in complaining of daily migraine headaches.  She says the headaches are mostly right frontal and temporal and is described as a throbbing and pulsating type headache.  She says it usually will lasts 3 or 4 hours and then she will take an Excedrin and goes away.  She has been getting them every day in the morning and sometimes in the evening as well.  She has been using vinegar to try and help with this.  She does say that she gets some nausea associated with it early on and then she gets blurred vision while she is having a headache as well.  She denies any visual aura.  She denies any focal numbness or weakness.  Patient has been diagnosed with migraines in the past and has tried Imitrex but is currently only using Excedrin and has never tried a preventative medication for migraines.  Relevant past medical, surgical, family and social history reviewed and updated as indicated. Interim medical history since our last visit reviewed. Allergies and medications reviewed and updated.  Review of Systems  Constitutional: Negative for chills and fever.    HENT: Negative for congestion, ear discharge, ear pain, sinus pressure and sinus pain.   Eyes: Positive for visual disturbance. Negative for photophobia.  Respiratory: Negative for chest tightness and shortness of breath.   Cardiovascular: Negative for chest pain and leg swelling.  Musculoskeletal: Negative for back pain and gait problem.  Skin: Negative for rash.  Neurological: Positive for headaches. Negative for dizziness, weakness, light-headedness and numbness.  Psychiatric/Behavioral: Negative for agitation and behavioral problems.  All other systems reviewed and are negative.   Per HPI unless specifically indicated above   Allergies as of 01/08/2018      Reactions   Imitrex [sumatriptan] Anaphylaxis   Could not swallow after taking med   Latuda [lurasidone Hcl]    Suicidal thoughts   Wellbutrin [bupropion] Other (See Comments)   Seizures / lowered seizure threshold   Zoloft [sertraline Hcl] Hives   Haloperidol And Related    Leg cramps   Nicoderm [nicotine] Dermatitis      Medication List        Accurate as of 01/08/18 11:41 AM. Always use your most recent med list.          aspirin-acetaminophen-caffeine 250-250-65 MG tablet Commonly known as:  EXCEDRIN MIGRAINE Take 2 tablets by mouth daily as needed for headache.   topiramate 50 MG tablet Commonly known as:  TOPAMAX Take 1 tablet (50 mg total) by mouth at bedtime.  Objective:    BP 118/76   Pulse 78   Temp (!) 97.4 F (36.3 C) (Oral)   Ht 5\' 3"  (1.6 m)   Wt 195 lb 3.2 oz (88.5 kg)   LMP 08/10/2016   BMI 34.58 kg/m   Wt Readings from Last 3 Encounters:  01/08/18 195 lb 3.2 oz (88.5 kg)  12/31/17 196 lb (88.9 kg)  11/11/17 185 lb (83.9 kg)    Physical Exam  Constitutional: She is oriented to person, place, and time. She appears well-developed and well-nourished. No distress.  HENT:  Mouth/Throat: Oropharynx is clear and moist.  Eyes: Pupils are equal, round, and reactive to light.  Conjunctivae and EOM are normal.  Neck: Neck supple. No thyromegaly present.  Cardiovascular: Normal rate, regular rhythm, normal heart sounds and intact distal pulses.  No murmur heard. Pulmonary/Chest: Effort normal and breath sounds normal. No respiratory distress. She has no wheezes.  Lymphadenopathy:    She has no cervical adenopathy.  Neurological: She is alert and oriented to person, place, and time. No cranial nerve deficit or sensory deficit. She exhibits normal muscle tone. Coordination normal.  Skin: Skin is warm and dry. No rash noted. She is not diaphoretic.  Psychiatric: She has a normal mood and affect. Her behavior is normal.  Nursing note and vitals reviewed.     Assessment & Plan:   Problem List Items Addressed This Visit      Cardiovascular and Mediastinum   Migraine headache - Primary   Relevant Medications   topiramate (TOPAMAX) 50 MG tablet   Essential hypertension, benign    Will start Topamax for migraine prevention, she continued using Excedrin for migraine abortant and will see back in 4 weeks to see if it is helping.  Blood pressure looks great today so I do not see the need of starting a blood pressure medication at this point. Follow up plan: Return in about 4 weeks (around 02/05/2018), or if symptoms worsen or fail to improve, for Migraine and hypertension recheck.  Counseling provided for all of the vaccine components No orders of the defined types were placed in this encounter.   Caryl Pina, MD [facility] 01/08/2018, 11:41 AM

## 2018-01-27 ENCOUNTER — Ambulatory Visit (INDEPENDENT_AMBULATORY_CARE_PROVIDER_SITE_OTHER): Payer: Medicare Other | Admitting: Orthopedic Surgery

## 2018-01-27 VITALS — BP 109/76 | HR 68 | Ht 63.0 in | Wt 185.0 lb

## 2018-01-27 DIAGNOSIS — G5601 Carpal tunnel syndrome, right upper limb: Secondary | ICD-10-CM | POA: Diagnosis not present

## 2018-01-27 NOTE — Progress Notes (Signed)
NEW PROBLEM OFFICE VISIT  Chief Complaint  Patient presents with  . Hand Pain    Right hand pain and numbness.    42 years old had a left carpal tunnel release did well comes in complaining of pain paresthesias numbness and tingling right hand primarily index long and part of the ring finger with some mild elbow pain and pain radiating to the elbow over the wrist.  She wore braces it did not help no specific modifying factors seems to be worse at night no improvement over the last 6 weeks even with the braces   Review of Systems  Constitutional: Negative for chills and fever.  Musculoskeletal: Positive for joint pain and myalgias.     Past Medical History:  Diagnosis Date  . Anxiety   . Bipolar 1 disorder ([place])   . Heart murmur   . Migraines   . Schizophrenia [facility])     Past Surgical History:  Procedure Laterality Date  . arm surgery Right    tendonitis-wrist  . BILATERAL SALPINGECTOMY Bilateral 08/29/2016   Procedure: BILATERAL SALPINGECTOMY;  Surgeon: Florian Buff, MD;  Location: [facility];  Service: Gynecology;  Laterality: Bilateral;  . CARPAL TUNNEL RELEASE Left 04/11/2016   Procedure: CARPAL TUNNEL RELEASE;  Surgeon: [name], MD;  Location: [facility];  Service: Orthopedics;  Laterality: Left;  . SUPRACERVICAL ABDOMINAL HYSTERECTOMY N/A 08/29/2016   Procedure: HYSTERECTOMY SUPRACERVICAL ABDOMINAL;  Surgeon: Florian Buff, MD;  Location: [facility];  Service: Gynecology;  Laterality: N/A;  . TUBAL LIGATION      Family History  Problem Relation Age of Onset  . Cancer Mother 58       breast CA at 5 and uterine CA at 63  . Hypertension Mother   . Kidney disease Mother        kidney transplant  . Cancer Sister 71       uterine  . Cancer Sister 56       uterine  . Diabetes Father   . Liver disease Father   . Cancer Maternal Aunt        breast   Social History   Tobacco Use  . Smoking status: Former Smoker    Packs/day: 0.50    Years: 7.00    Pack years:  3.50    Types: Cigarettes  . Smokeless tobacco: Never Used  . Tobacco comment: smokes 2 cig daily  Substance Use Topics  . Alcohol use: Yes  . Drug use: Yes    Types: Marijuana    Comment: once a week    Allergies  Allergen Reactions  . Imitrex [Sumatriptan] Anaphylaxis    Could not swallow after taking med  . Latuda [Lurasidone Hcl]     Suicidal thoughts   . Wellbutrin [Bupropion] Other (See Comments)    Seizures / lowered seizure threshold  . Zoloft [Sertraline Hcl] Hives  . Haloperidol And Related     Leg cramps  . Nicoderm [Nicotine] Dermatitis    No outpatient medications have been marked as taking for the 01/27/18 encounter (Office Visit) with [name], MD.    BP 109/76   Pulse 68   Ht 5\' 3"  (1.6 m)   Wt 185 lb (83.9 kg)   LMP 08/10/2016   BMI 32.77 kg/m   Physical Exam  Constitutional: She is oriented to person, place, and time. She appears well-developed and well-nourished.  Neurological: She is alert and oriented to person, place, and time.  Psychiatric: She has a normal  mood and affect. Judgment normal.  Vitals reviewed.   Ortho Exam Left wrist carpal tunnel incision she still has some residual sensory difference on the tip of the long finger and ring finger full range of motion and stability are recorded strength is normal skin is intact pulses are good lymph nodes are negative soft touch sensation is normal  On the right side she has an incision from a de Quervain's release her carpal tunnel is tender she has tenderness over the medial elbow elbow without radiation full range of motion of the wrist and elbow are noted stability tests were normal strength was intact skin was normal pulses were good sensory loss in the index long and part of the ring finger  Equal reflexes bilaterally at the elbow and wrist   MEDICAL DECISION SECTION  Xrays were done at N/A  My independent reading of xrays:  N/A  Encounter Diagnosis  Name Primary?  .  Carpal tunnel syndrome of right wrist Yes    PLAN: (Rx., injectx, surgery, frx, mri/ct) [facility]   The procedure has been fully reviewed with the patient; The risks and benefits of surgery have been discussed and explained and understood. Alternative treatment has also been reviewed, questions were encouraged and answered. The postoperative plan is also been reviewed.   No orders of the defined types were placed in this encounter.   Arther Abbott, MD  01/27/2018 10:10 AM

## 2018-01-27 NOTE — Patient Instructions (Signed)
Carpal Tunnel Release Carpal tunnel release is a surgical procedure to relieve numbness and pain in your hand that are caused by carpal tunnel syndrome. Your carpal tunnel is a narrow, hollow space in your wrist. It passes between your wrist bones and a band of connective tissue (transverse carpal ligament). The nerve that supplies most of your hand (median nerve) passes through this space, and so do the connections between your fingers and the muscles of your arm (tendons). Carpal tunnel syndrome makes this space swell and become narrow, and this causes pain and numbness. In carpal tunnel release surgery, a surgeon cuts through the transverse carpal ligament to make more room in the carpal tunnel space. You may have this surgery if other types of treatment have not worked. Tell a health care provider about:  Any allergies you have.  All medicines you are taking, including vitamins, herbs, eye drops, creams, and over-the-counter medicines.  Any problems you or family members have had with anesthetic medicines.  Any blood disorders you have.  Any surgeries you have had.  Any medical conditions you have. What are the risks? Generally, this is a safe procedure. However, problems may occur, including:  Bleeding.  Infection.  Injury to the median nerve.  Need for additional surgery.  What happens before the procedure?  Ask your health care provider about: ? Changing or stopping your regular medicines. This is especially important if you are taking diabetes medicines or blood thinners. ? Taking medicines such as aspirin and ibuprofen. These medicines can thin your blood. Do not take these medicines before your procedure if your health care provider instructs you not to.  Do not eat or drink anything after midnight on the night before the procedure or as directed by your health care provider.  Plan to have someone take you home after the procedure. What happens during the  procedure?  An IV tube may be inserted into a vein.  You will be given one of the following: ? A medicine that numbs the wrist area (local anesthetic). You may also be given a medicine to make you relax (sedative). ? A medicine that makes you go to sleep (general anesthetic).  Your arm, hand, and wrist will be cleaned with a germ-killing solution (antiseptic).  Your surgeon will make a surgical cut (incision) over the palm side of your wrist. The surgeon will pull aside the skin of your wrist to expose the carpal tunnel space.  The surgeon will cut the transverse carpal ligament.  The edges of the incision will be closed with stitches (sutures) or staples.  A bandage (dressing) will be placed over your wrist and wrapped around your hand and wrist. What happens after the procedure?  You may spend some time in a recovery area.  Your blood pressure, heart rate, breathing rate, and blood oxygen level will be monitored often until the medicines you were given have worn off.  You will likely have some pain. You will be given pain medicine.  You may need to wear a splint or a wrist brace over your dressing. This information is not intended to replace advice given to you by your health care provider. Make sure you discuss any questions you have with your health care provider. Document Released: 05/12/2003 Document Revised: 07/28/2015 Document Reviewed: 10/07/2013 Elsevier Interactive Patient Education  2018 [employer]  

## 2018-01-31 ENCOUNTER — Other Ambulatory Visit: Payer: Self-pay | Admitting: Family Medicine

## 2018-01-31 DIAGNOSIS — G43009 Migraine without aura, not intractable, without status migrainosus: Secondary | ICD-10-CM

## 2018-02-07 ENCOUNTER — Ambulatory Visit (INDEPENDENT_AMBULATORY_CARE_PROVIDER_SITE_OTHER): Payer: Medicare Other | Admitting: Family Medicine

## 2018-02-07 ENCOUNTER — Encounter: Payer: Self-pay | Admitting: Family Medicine

## 2018-02-07 ENCOUNTER — Encounter: Payer: Medicare Other | Admitting: Family Medicine

## 2018-02-07 VITALS — BP 126/73 | HR 65 | Temp 98.1°F | Ht 63.0 in | Wt 188.0 lb

## 2018-02-07 DIAGNOSIS — K219 Gastro-esophageal reflux disease without esophagitis: Secondary | ICD-10-CM

## 2018-02-07 DIAGNOSIS — G43009 Migraine without aura, not intractable, without status migrainosus: Secondary | ICD-10-CM | POA: Diagnosis not present

## 2018-02-07 DIAGNOSIS — E785 Hyperlipidemia, unspecified: Secondary | ICD-10-CM | POA: Diagnosis not present

## 2018-02-07 DIAGNOSIS — I10 Essential (primary) hypertension: Secondary | ICD-10-CM

## 2018-02-07 MED ORDER — TOPIRAMATE 50 MG PO TABS: 50.0000 mg | ORAL_TABLET | Freq: Every day | ORAL | 3 refills | Status: DC

## 2018-02-07 NOTE — Progress Notes (Signed)
BP 126/73   Pulse 65   Temp 98.1 F (36.7 C) (Oral)   Ht '5\' 3"'$  (1.6 m)   Wt 188 lb (85.3 kg)   LMP 08/10/2016   BMI 33.30 kg/m    Subjective:    Patient ID: Stacy Wolf, female    DOB: 06/16/1975, 42 y.o.   MRN: [id number]  HPI: Stacy Wolf is a 42 y.o. female presenting on 02/07/2018 for Hypertension (1 month follow up) and Migraine (Patient states she still gets them but it is better.)   HPI Hypertension Patient is currently on diet control, and their blood pressure today is 126/73. Patient denies any lightheadedness or dizziness. Patient denies headaches, blurred vision, chest pains, shortness of breath, or weakness. Denies any side effects from medication and is content with current medication.   GERD Patient is currently on no medication currently.  She denies any major symptoms or abdominal pain or belching or burping. She denies any blood in her stool or lightheadedness or dizziness.   Hyperlipidemia Patient is coming in for recheck of his hyperlipidemia. The patient is currently taking no medication currently has been doing diet control. They deny any issues with myalgias or history of liver damage from it. They deny any focal numbness or weakness or chest pain.   Migraine recheck Patient is coming in today for migraine recheck.  She currently uses Excedrin and Topamax for migraines.  She says since she is been on the Topamax she has been feeling a lot better and she only gets the migraine headaches every now and then and then been a lot less severe and she is been very satisfied with where she is at with this.  She denies any severe ones like she had previously.  When she does get her migraines they are still bilateral and on the top of her head in the frontal region.  She denies any aura.  Relevant past medical, surgical, family and social history reviewed and updated as indicated. Interim medical history since our last visit reviewed. Allergies and medications  reviewed and updated.  Review of Systems  Constitutional: Negative for chills and fever.  HENT: Negative for congestion, ear discharge and ear pain.   Eyes: Negative for redness and visual disturbance.  Respiratory: Negative for chest tightness and shortness of breath.   Cardiovascular: Negative for chest pain and leg swelling.  Genitourinary: Negative for difficulty urinating and dysuria.  Musculoskeletal: Negative for back pain and gait problem.  Skin: Negative for rash.  Neurological: Positive for headaches. Negative for dizziness, weakness, light-headedness and numbness.  Psychiatric/Behavioral: Negative for agitation and behavioral problems.  All other systems reviewed and are negative.   Per HPI unless specifically indicated above   Allergies as of 02/07/2018      Reactions   Imitrex [sumatriptan] Anaphylaxis   Could not swallow after taking med   Latuda [lurasidone Hcl]    Suicidal thoughts   Wellbutrin [bupropion] Other (See Comments)   Seizures / lowered seizure threshold   Zoloft [sertraline Hcl] Hives   Haloperidol And Related    Leg cramps   Nicoderm [nicotine] Dermatitis      Medication List        Accurate as of 02/07/18 10:43 AM. Always use your most recent med list.          aspirin-acetaminophen-caffeine 250-250-65 MG tablet Commonly known as:  EXCEDRIN MIGRAINE Take 2 tablets by mouth daily as needed for headache.   topiramate 50 MG tablet Commonly known as:  TOPAMAX Take 1 tablet (50 mg total) by mouth at bedtime.          Objective:    BP 126/73   Pulse 65   Temp 98.1 F (36.7 C) (Oral)   Ht 5' 3" (1.6 m)   Wt 188 lb (85.3 kg)   LMP 08/10/2016   BMI 33.30 kg/m   Wt Readings from Last 3 Encounters:  02/07/18 188 lb (85.3 kg)  01/27/18 185 lb (83.9 kg)  01/08/18 195 lb 3.2 oz (88.5 kg)    Physical Exam  Constitutional: She is oriented to person, place, and time. She appears well-developed and well-nourished. No distress.  Eyes:  Pupils are equal, round, and reactive to light. Conjunctivae and EOM are normal.  Neck: Neck supple. No thyromegaly present.  Cardiovascular: Normal rate, regular rhythm, normal heart sounds and intact distal pulses.  No murmur heard. Pulmonary/Chest: Effort normal and breath sounds normal. No respiratory distress. She has no wheezes.  Musculoskeletal: Normal range of motion. She exhibits no edema or tenderness.  Lymphadenopathy:    She has no cervical adenopathy.  Neurological: She is alert and oriented to person, place, and time. Coordination normal.  Skin: Skin is warm and dry. No rash noted. She is not diaphoretic.  Psychiatric: She has a normal mood and affect. Her behavior is normal.  Nursing note and vitals reviewed.       Assessment & Plan:   Problem List Items Addressed This Visit      Cardiovascular and Mediastinum   Migraine headache   Relevant Medications   topiramate (TOPAMAX) 50 MG tablet   Essential hypertension, benign - Primary   Relevant Orders   CMP14+EGFR     Digestive   Gastroesophageal reflux disease without esophagitis   Relevant Orders   CBC with Differential/Platelet     Other   Hyperlipidemia LDL goal <100   Relevant Orders   Lipid panel      Patient doing well with migraines and blood pressures controlled, continue current medication Follow up plan: Return in about 1 year (around 02/08/2019), or if symptoms worsen or fail to improve, for Hypertension GERD and migraines.  Counseling provided for all of the vaccine components Orders Placed This Encounter  Procedures  . CBC with Differential/Platelet  . CMP14+EGFR  . Lipid panel    Caryl Pina, MD [facility] 02/07/2018, 10:43 AM

## 2018-02-08 LAB — CBC WITH DIFFERENTIAL/PLATELET
Basophils Absolute: 0.1 10*3/uL (ref 0.0–0.2)
Basos: 1 %
EOS (ABSOLUTE): 0.1 10*3/uL (ref 0.0–0.4)
Eos: 2 %
HEMATOCRIT: 39.5 % (ref 34.0–46.6)
HEMOGLOBIN: 13.2 g/dL (ref 11.1–15.9)
IMMATURE GRANS (ABS): 0 10*3/uL (ref 0.0–0.1)
Immature Granulocytes: 0 %
LYMPHS ABS: 2.9 10*3/uL (ref 0.7–3.1)
LYMPHS: 42 %
MCH: 30.5 pg (ref 26.6–33.0)
MCHC: 33.4 g/dL (ref 31.5–35.7)
MCV: 91 fL (ref 79–97)
MONOCYTES: 6 %
Monocytes Absolute: 0.4 10*3/uL (ref 0.1–0.9)
NEUTROS ABS: 3.4 10*3/uL (ref 1.4–7.0)
Neutrophils: 49 %
Platelets: 260 10*3/uL (ref 150–450)
RBC: 4.33 x10E6/uL (ref 3.77–5.28)
RDW: 13.6 % (ref 12.3–15.4)
WBC: 6.9 10*3/uL (ref 3.4–10.8)

## 2018-02-08 LAB — LIPID PANEL
CHOL/HDL RATIO: 4.2 ratio (ref 0.0–4.4)
Cholesterol, Total: 189 mg/dL (ref 100–199)
HDL: 45 mg/dL (ref >39)
LDL Calculated: 123 mg/dL — ABNORMAL HIGH (ref 0–99)
TRIGLYCERIDES: 104 mg/dL (ref 0–149)
VLDL Cholesterol Cal: 21 mg/dL (ref 5–40)

## 2018-02-08 LAB — CMP14+EGFR
ALT: 9 IU/L (ref 0–32)
AST: 13 IU/L (ref 0–40)
Albumin/Globulin Ratio: 1.9 (ref 1.2–2.2)
Albumin: 4.6 g/dL (ref 3.5–5.5)
Alkaline Phosphatase: 95 IU/L (ref 39–117)
BUN/Creatinine Ratio: 10 (ref 9–23)
BUN: 10 mg/dL (ref 6–24)
Bilirubin Total: 0.3 mg/dL (ref 0.0–1.2)
CALCIUM: 9.5 mg/dL (ref 8.7–10.2)
CHLORIDE: 107 mmol/L — AB (ref 96–106)
CO2: 17 mmol/L — ABNORMAL LOW (ref 20–29)
Creatinine, Ser: 1 mg/dL (ref 0.57–1.00)
GFR calc Af Amer: 80 mL/min/{1.73_m2} (ref >59)
GFR, EST NON AFRICAN AMERICAN: 69 mL/min/{1.73_m2} (ref >59)
GLUCOSE: 93 mg/dL (ref 65–99)
Globulin, Total: 2.4 g/dL (ref 1.5–4.5)
Potassium: 3.9 mmol/L (ref 3.5–5.2)
Sodium: 143 mmol/L (ref 134–144)
Total Protein: 7 g/dL (ref 6.0–8.5)

## 2018-02-18 DIAGNOSIS — Z1231 Encounter for screening mammogram for malignant neoplasm of breast: Secondary | ICD-10-CM | POA: Diagnosis not present

## 2018-02-18 LAB — HM MAMMOGRAPHY

## 2018-02-24 ENCOUNTER — Encounter: Payer: Self-pay | Admitting: Family Medicine

## 2018-02-24 ENCOUNTER — Ambulatory Visit (INDEPENDENT_AMBULATORY_CARE_PROVIDER_SITE_OTHER): Payer: Medicare Other | Admitting: Family Medicine

## 2018-02-24 VITALS — BP 117/79 | HR 74 | Temp 98.1°F | Ht 63.0 in | Wt 186.0 lb

## 2018-02-24 DIAGNOSIS — N898 Other specified noninflammatory disorders of vagina: Secondary | ICD-10-CM | POA: Diagnosis not present

## 2018-02-24 DIAGNOSIS — B3731 Acute candidiasis of vulva and vagina: Secondary | ICD-10-CM

## 2018-02-24 DIAGNOSIS — B373 Candidiasis of vulva and vagina: Secondary | ICD-10-CM | POA: Diagnosis not present

## 2018-02-24 LAB — WET PREP FOR TRICH, YEAST, CLUE
CLUE CELL EXAM: NEGATIVE
Trichomonas Exam: NEGATIVE
YEAST EXAM: POSITIVE — AB

## 2018-02-24 MED ORDER — FLUCONAZOLE 150 MG PO TABS: 150.0000 mg | ORAL_TABLET | Freq: Once | ORAL | 1 refills | Status: AC

## 2018-02-24 NOTE — Progress Notes (Addendum)
Subjective:    Patient ID: Stacy Wolf, female    DOB: 1975-07-26, 42 y.o.   MRN: [id number]  Chief Complaint:  Abdominal Cramping, vaginal discharge (x 5 days)   HPI: Stacy Wolf is a 42 y.o. female presenting on 02/24/2018 for Abdominal Cramping, vaginal discharge (x 5 days)  Pt presents today with complaints of lower abdominal pressure / cramping and vaginal discharge and irritation. She reports this started about five days ago. States the cramping is intermittent. States the vaginal discharge and irritation started around the same time. States she has severe pruritis and thick white vaginal discharge. Denies new sexual partners or recent antibiotic use. States she has had similar symptoms in the past. Denies dysuria, fever, chills, dyspareunia, pelvic pain, or flank pain. Denies vaginal bleeding or hematuria.   Relevant past medical, surgical, family, and social history reviewed and updated as indicated.  Allergies and medications reviewed and updated.   Past Medical History:  Diagnosis Date  . Anxiety   . Bipolar 1 disorder ([place])   . Heart murmur   . Migraines   . Schizophrenia [facility])     Past Surgical History:  Procedure Laterality Date  . arm surgery Right    tendonitis-wrist  . BILATERAL SALPINGECTOMY Bilateral 08/29/2016   Procedure: BILATERAL SALPINGECTOMY;  Surgeon: Florian Buff, MD;  Location: [facility];  Service: Gynecology;  Laterality: Bilateral;  . CARPAL TUNNEL RELEASE Left 04/11/2016   Procedure: CARPAL TUNNEL RELEASE;  Surgeon: [name], MD;  Location: [facility];  Service: Orthopedics;  Laterality: Left;  . SUPRACERVICAL ABDOMINAL HYSTERECTOMY N/A 08/29/2016   Procedure: HYSTERECTOMY SUPRACERVICAL ABDOMINAL;  Surgeon: Florian Buff, MD;  Location: [facility];  Service: Gynecology;  Laterality: N/A;  . TUBAL LIGATION      Social History   Socioeconomic History  . Marital status: Divorced    Spouse name: Not on file  . Number of children: 3  .  Years of education: Not on file  . Highest education level: 11th grade  Occupational History  . Not on file  Social Needs  . Financial resource strain: Not hard at all  . Food insecurity:    Worry: Never true    Inability: Never true  . Transportation needs:    Medical: No    Non-medical: No  Tobacco Use  . Smoking status: Former Smoker    Packs/day: 0.50    Years: 7.00    Pack years: 3.50    Types: Cigarettes  . Smokeless tobacco: Never Used  . Tobacco comment: smokes 2 cig daily  Substance and Sexual Activity  . Alcohol use: Yes  . Drug use: Yes    Types: Marijuana    Comment: once a week  . Sexual activity: Yes    Birth control/protection: Surgical    Comment: hyst  Lifestyle  . Physical activity:    Days per week: 0 days    Minutes per session: 0 min  . Stress: Not at all  Relationships  . Social connections:    Talks on phone: More than three times a week    Gets together: Three times a week    Attends religious service: Never    Active member of club or organization: No    Attends meetings of clubs or organizations: Never    Relationship status: Divorced  . Intimate partner violence:    Fear of current or ex partner: No    Emotionally abused: No    Physically abused:  No    Forced sexual activity: No  Other Topics Concern  . Not on file  Social History Narrative  . Not on file    Outpatient Encounter Medications as of 02/24/2018  Medication Sig  . aspirin-acetaminophen-caffeine (EXCEDRIN MIGRAINE) 250-250-65 MG tablet Take 2 tablets by mouth every 6 (six) hours as needed for headache or migraine.   . topiramate (TOPAMAX) 50 MG tablet Take 1 tablet (50 mg total) by mouth at bedtime.  . fluconazole (DIFLUCAN) 150 MG tablet Take 1 tablet (150 mg total) by mouth once for 1 dose.   No facility-administered encounter medications on file as of 02/24/2018.     Allergies  Allergen Reactions  . Imitrex [Sumatriptan] Anaphylaxis and Other (See Comments)     Could not swallow after taking med  . Latuda [Lurasidone Hcl] Other (See Comments)    Suicidal thoughts   . Wellbutrin [Bupropion] Other (See Comments)    Seizures / lowered seizure threshold  . Zoloft [Sertraline Hcl] Hives  . Haloperidol And Related Other (See Comments)    Leg cramps  . Nicoderm [Nicotine] Dermatitis    Review of Systems  Constitutional: Negative for chills, fatigue and fever.  Cardiovascular: Negative for chest pain.  Gastrointestinal: Positive for abdominal pain (lower abdominal pressure and cramping). Negative for abdominal distention, anal bleeding, blood in stool, constipation, diarrhea, nausea, rectal pain and vomiting.  Genitourinary: Positive for vaginal discharge. Negative for decreased urine volume, difficulty urinating, dyspareunia, dysuria, enuresis, flank pain, frequency, genital sores, hematuria, menstrual problem, pelvic pain, urgency, vaginal bleeding and vaginal pain (irritation and pruritis).  Musculoskeletal: Negative for back pain.  Neurological: Negative for weakness.  Psychiatric/Behavioral: Negative for confusion.  All other systems reviewed and are negative.       Objective:    BP 117/79   Pulse 74   Temp 98.1 F (36.7 C) (Oral)   Ht 5\' 3"  (1.6 m)   Wt 186 lb (84.4 kg)   LMP 08/10/2016   BMI 32.95 kg/m    Wt Readings from Last 3 Encounters:  02/24/18 186 lb (84.4 kg)  02/07/18 188 lb (85.3 kg)  01/27/18 185 lb (83.9 kg)    Physical Exam Vitals signs and nursing note reviewed.  Constitutional:      General: She is not in acute distress.    Appearance: Normal appearance. She is well-developed and well-groomed. She is obese. She is not ill-appearing.  HENT:     Head: Normocephalic and atraumatic.     Mouth/Throat:     Mouth: Mucous membranes are moist.  Eyes:     Conjunctiva/sclera: Conjunctivae normal.     Pupils: Pupils are equal, round, and reactive to light.  Cardiovascular:     Rate and Rhythm: Normal rate and  regular rhythm.     Heart sounds: Normal heart sounds. No murmur. No friction rub. No gallop.   Pulmonary:     Effort: Pulmonary effort is normal. No respiratory distress.     Breath sounds: Normal breath sounds.  Abdominal:     General: Bowel sounds are normal. There is no distension.     Palpations: Abdomen is soft. There is no mass.     Tenderness: There is no abdominal tenderness. There is no right CVA tenderness or left CVA tenderness.     Hernia: No hernia is present. There is no hernia in the right inguinal area or left inguinal area.  Genitourinary:    Exam position: Lithotomy position.     Pubic Area: No rash or  pubic lice.      Labia:        Right: No rash, tenderness, lesion or injury.        Left: No rash, tenderness, lesion or injury.      Urethra: No prolapse, urethral pain, urethral swelling or urethral lesion.     Vagina: Vaginal discharge (thick, white) present.     Uterus: Absent.      Comments: Cervix surgically absent, vaginal cuff intact.  Lymphadenopathy:     Lower Body: No right inguinal adenopathy. No left inguinal adenopathy.  Skin:    General: Skin is warm and dry.     Capillary Refill: Capillary refill takes less than 2 seconds.  Neurological:     General: No focal deficit present.     Mental Status: She is alert and oriented to person, place, and time.  Psychiatric:        Mood and Affect: Mood normal.        Behavior: Behavior normal. Behavior is cooperative.        Thought Content: Thought content normal.        Judgment: Judgment normal.     Results for orders placed or performed in visit on 02/21/18  [facility] MAMMOGRAPHY  Result Value Ref Range   HM Mammogram 0-4 Bi-Rad 0-4 Bi-Rad, Self Reported Normal     Wet prep positive for yeast, negative for clue cells or trich.  Pertinent labs & imaging results that were available during my care of the patient were reviewed by me and considered in my medical decision making.  Assessment & Plan:  Alizea  was seen today for abdominal cramping, vaginal discharge.  Diagnoses and all orders for this visit:  Vaginal irritation -     WET PREP FOR TRICH, YEAST, CLUE  Vaginal candidiasis Medications as prescribed. Probiotics daily. Cotton underwear. Vaginal hygiene discussed. Report any new or worsening symptoms.  -     fluconazole (DIFLUCAN) 150 MG tablet; Take 1 tablet (150 mg total) by mouth once for 1 dose.     Continue all other maintenance medications.  Follow up plan: Return if symptoms worsen or fail to improve.  Educational handout given for vaginal yeast infection  The above assessment and management plan was discussed with the patient. The patient verbalized understanding of and has agreed to the management plan. Patient is aware to call the clinic if symptoms persist or worsen. Patient is aware when to return to the clinic for a follow-up visit. Patient educated on when it is appropriate to go to the emergency department.   Monia Pouch, FNP-C [facility] [phone]

## 2018-02-24 NOTE — Patient Instructions (Signed)
Vaginal Yeast infection, Adult    Vaginal yeast infection is a condition that causes vaginal discharge as well as soreness, swelling, and redness (inflammation) of the vagina. This is a common condition. Some women get this infection frequently.  What are the causes?  This condition is caused by a change in the normal balance of the yeast (candida) and bacteria that live in the vagina. This change causes an overgrowth of yeast, which causes the inflammation.  What increases the risk?  The condition is more likely to develop in women who:   Take antibiotic medicines.   Have diabetes.   Take birth control pills.   Are pregnant.   Douche often.   Have a weak body defense system (immune system).   Have been taking steroid medicines for a long time.   Frequently wear tight clothing.  What are the signs or symptoms?  Symptoms of this condition include:   White, thick, creamy vaginal discharge.   Swelling, itching, redness, and irritation of the vagina. The lips of the vagina (vulva) may be affected as well.   Pain or a burning feeling while urinating.   Pain during sex.  How is this diagnosed?  This condition is diagnosed based on:   Your medical history.   A physical exam.   A pelvic exam. Your health care provider will examine a sample of your vaginal discharge under a microscope. Your health care provider may send this sample for testing to confirm the diagnosis.  How is this treated?  This condition is treated with medicine. Medicines may be over-the-counter or prescription. You may be told to use one or more of the following:   Medicine that is taken by mouth (orally).   Medicine that is applied as a cream (topically).   Medicine that is inserted directly into the vagina (suppository).  Follow these instructions at home:    Lifestyle   Do not have sex until your health care provider approves. Tell your sex partner that you have a yeast infection. That person should go to his or her health care  provider and ask if they should also be treated.   Do not wear tight clothes, such as pantyhose or tight pants.   Wear breathable cotton underwear.  General instructions   Take or apply over-the-counter and prescription medicines only as told by your health care provider.   Eat more yogurt. This may help to keep your yeast infection from returning.   Do not use tampons until your health care provider approves.   Try taking a sitz bath to help with discomfort. This is a warm water bath that is taken while you are sitting down. The water should only come up to your hips and should cover your buttocks. Do this 3-4 times per day or as told by your health care provider.   Do not douche.   If you have diabetes, keep your blood sugar levels under control.   Keep all follow-up visits as told by your health care provider. This is important.  Contact a health care provider if:   You have a fever.   Your symptoms go away and then return.   Your symptoms do not get better with treatment.   Your symptoms get worse.   You have new symptoms.   You develop blisters in or around your vagina.   You have blood coming from your vagina and it is not your menstrual period.   You develop pain in your abdomen.  Summary     Vaginal yeast infection is a condition that causes discharge as well as soreness, swelling, and redness (inflammation) of the vagina.   This condition is treated with medicine. Medicines may be over-the-counter or prescription.   Take or apply over-the-counter and prescription medicines only as told by your health care provider.   Do not douche. Do not have sex or use tampons until your health care provider approves.   Contact a health care provider if your symptoms do not get better with treatment or your symptoms go away and then return.  This information is not intended to replace advice given to you by your health care provider. Make sure you discuss any questions you have with your health care  provider.  Document Released: 11/29/2004 Document Revised: 07/08/2017 Document Reviewed: 07/08/2017  [employer] Interactive Patient Education  2019 [employer]

## 2018-02-25 NOTE — Patient Instructions (Signed)
Stacy Wolf  02/25/2018     @PREFPERIOPPHARMACY @   Your procedure is scheduled on  03/13/2018.  Report to [facility] at  [place].M.  Call this number if you have problems the morning of surgery:  [phone]   Remember:  Do not eat or drink after midnight.                        Take these medicines the morning of surgery with A SIP OF WATER  None    Do not wear jewelry, make-up or nail polish.  Do not wear lotions, powders, or perfumes, or deodorant.  Do not shave 48 hours prior to surgery.  Men may shave face and neck.  Do not bring valuables to the hospital.  [facility] is not responsible for any belongings or valuables.  Contacts, dentures or bridgework may not be worn into surgery.  Leave your suitcase in the car.  After surgery it may be brought to your room.  For patients admitted to the hospital, discharge time will be determined by your treatment team.  Patients discharged the day of surgery will not be allowed to drive home.   Name and phone number of your driver:   family Special instructions: None  Please read over the following fact sheets that you were given. Anesthesia Post-op Instructions and Care and Recovery After Surgery       Open Carpal Tunnel Release  Open carpal tunnel release is a surgery to relieve symptoms caused by carpal tunnel syndrome. The carpal tunnel is a narrow, hollow space in the wrist. It is located between the wrist bones and a band of connective tissue (transverse carpal ligament, also known as the flexor retinaculum). The nerve that supplies most of the hand (median nerve) passes through the carpal tunnel, and so do tissues that connect bones to muscles (tendons) in the hand and arm. Carpal tunnel syndrome makes this space swell and become narrow. The swelling pinches the median nerve and causes pain and numbness. During carpal tunnel release surgery, the transverse carpal ligament is cut to make more room in the  carpal tunnel space. This also lessens the pressure on the median nerve. You may have this surgery if other types of treatment have not relieved your carpal tunnel symptoms. This surgery is usually done only for the hand that you use more often (dominant hand), but it may be done for both hands depending on your symptoms. Tell a health care provider about:  Any allergies you have.  All medicines you are taking, including vitamins, herbs, eye drops, creams, and over-the-counter medicines.  Any problems you or family members have had with anesthetic medicines.  Any blood disorders you have.  Any surgeries you have had.  Any medical conditions you have.  Whether you are pregnant or may be pregnant. What are the risks? Generally, this is a safe procedure. However, problems may occur, including:  Infection.  Bleeding.  Injury to the median nerve.  Allergic reactions to medicines.  The surgery failing to relieve your symptoms, or making your symptoms worse. What happens before the procedure? Medicines  Ask your health care provider about: ? Changing or stopping your regular medicines. This is especially important if you are taking diabetes medicines or blood thinners. ? Taking medicines such as aspirin and ibuprofen. These medicines can thin your blood. Do not take these medicines unless your health  care provider tells you to take them. ? Taking over-the-counter medicines, vitamins, herbs, and supplements.  You may be given antibiotic medicine to help prevent infection. Staying hydrated Follow instructions from your health care provider about hydration, which may include:  Up to 2 hours before the procedure - you may continue to drink clear liquids, such as water, clear fruit juice, black coffee, and plain tea. Eating and drinking restrictions Follow instructions from your health care provider about eating and drinking, which may include:  8 hours before the procedure - stop  eating heavy meals or foods such as meat, fried foods, or fatty foods.  6 hours before the procedure - stop eating light meals or foods, such as toast or cereal.  6 hours before the procedure - stop drinking milk or drinks that contain milk.  2 hours before the procedure - stop drinking clear liquids. General instructions  Ask your health care provider how your surgical site will be marked or identified.  You may be asked to shower with a germ-killing soap.  Plan to have someone take you home from the hospital or clinic.  Plan to have a responsible adult care for you for at least 24 hours after you leave the hospital or clinic. This is important. What happens during the procedure?  To lower your risk of infection: ? Your health care team will wash or sanitize their hands. ? Hair may be removed from the surgical area. ? Your arm, hand, and wrist will be cleaned with a germ-killing (antiseptic) solution.  An IV will be inserted into one of your veins.  You will be given one of the following: ? A medicine to numb the wrist area (local anesthetic). You may also be given a medicine to help you relax (sedative). ? A medicine to make you fall asleep (general anesthetic).  An incision will be made in your wrist, on the same side as your palm.  The skin of your wrist will be spread to expose the transverse carpal ligament.  The transverse carpal ligament will be cut to make more room in the carpal tunnel space.  Your incision will be closed with stitches (sutures) or staples.  A bandage (dressing) will be placed over your wrist and wrapped around your hand and wrist. The procedure may vary among health care providers and hospitals. What happens after the procedure?  Your blood pressure, heart rate, breathing rate, and blood oxygen level will be monitored until the medicines you were given have worn off.  You will be given pain medicine as needed.  A splint or brace may be placed  over your dressing, to hold your hand and wrist in place while you heal.  Do not drive until your health care provider approves. Summary  Carpal tunnel release is a surgery to relieve pain and numbness in the hand caused by swelling around a nerve (carpal tunnel syndrome).  You may have this surgery if other types of treatment have not relieved your carpal tunnel symptoms.  During carpal tunnel release surgery, a band of connective tissue (transverse carpal ligament) is cut to make more room in the carpal tunnel space. This information is not intended to replace advice given to you by your health care provider. Make sure you discuss any questions you have with your health care provider. Document Released: 05/12/2003 Document Revised: 10/29/2016 Document Reviewed: 10/29/2016 Elsevier Interactive Patient Education  2019 [place].  Open Carpal Tunnel Release, Care After This sheet gives you information about how  to care for yourself after your procedure. Your health care provider may also give you more specific instructions. If you have problems or questions, contact your health care provider. What can I expect after the procedure? After the procedure, it is common to have:  Wrist stiffness.  Bruising. Follow these instructions at home: Bathing  Do not take baths, swim, or use a hot tub until your health care provider approves. Ask your health care provider if you may take showers.  Keep your bandage (dressing) dry until your health care provider says it can be removed. If you have a splint or brace:  Wear the splint or brace as told by your health care provider. You may need to wear it for 2-3 weeks. Remove it only as told by your health care provider.  Loosen the splint or brace if your fingers tingle, become numb, or turn cold and blue.  Keep the splint or brace clean.  If the splint or brace is not waterproof: ? Do not let it get wet. ? Cover it with a watertight covering  when you take a bath or a shower. Incision care   Follow instructions from your health care provider about how to take care of your incision. Make sure you: ? Wash your hands with soap and water before you change your dressing. If soap and water are not available, use hand sanitizer. ? Change your dressing as told by your health care provider. ? Leave stitches (sutures), skin glue, or adhesive strips in place. These skin closures may need to stay in place for 2 weeks or longer. If adhesive strip edges start to loosen and curl up, you may trim the loose edges. Do not remove adhesive strips completely unless your health care provider tells you to do that.  Check your incision area every day for signs of infection. Check for: ? Redness, swelling, or pain. ? Fluid or blood. ? Warmth. ? Pus or a bad smell. Managing pain, stiffness, and swelling   If directed, put ice on the affected area. ? If you have a removable splint or brace, remove it as told by your health care provider. ? Put ice in a plastic bag. ? Place a towel between your skin and the bag. ? Leave the ice on for 20 minutes, 2-3 times a day.  Move your fingers often to avoid stiffness and to lessen swelling.  Raise (elevate) your wrist above the level of your heart while you are sitting or lying down. Activity  Do not drive until your health care provider approves.  Do not drive or use heavy machinery while taking prescription pain medicine.  Return to your normal activities as told by your health care provider. Avoid activities that cause pain.  If physical therapy was prescribed, do exercises as told by your therapist. Physical therapy can help you heal faster and regain movement. General instructions  Take over-the-counter and prescription medicines only as told by your health care provider.  If you are taking prescription pain medicine, take actions to prevent or treat constipation. Your health care provider may  recommend that you: ? Drink enough fluid to keep your urine pale yellow. ? Eat foods that are high in fiber, such as fresh fruits and vegetables, whole grains, and beans. ? Limit foods that are high in fat and processed sugars, such as fried or sweet foods. ? Take an over-the-counter or prescription medicine for constipation.  Do not use any products that contain nicotine or tobacco, such as  cigarettes and e-cigarettes. If you need help quitting, ask your health care provider.  Keep all follow-up visits as told by your health care provider and physical therapist. This is important. Contact a health care provider if:  You have redness or swelling around your incision.  You have fluid or blood coming from your incision.  Your incision feels warm to the touch.  You have pus or a bad smell coming from your incision.  You have a fever.  You have chills.  You have pain that does not get better with medicine.  Your carpal tunnel symptoms do not go away after 2 months.  Your carpal tunnel symptoms go away and then come back. Get help right away if:  You have pain or numbness that is getting worse.  Your fingers or fingertips become very pale or bluish in color.  You are not able to move your fingers. Summary  It is common to have wrist stiffness and bruising after a carpal tunnel release.  Icing and raising (elevating) your wrist may help to lessen swelling and pain.  Call your health care provider if you have a fever or notice any signs of infection in your incision area. This information is not intended to replace advice given to you by your health care provider. Make sure you discuss any questions you have with your health care provider. Document Released: 09/08/2004 Document Revised: 10/29/2016 Document Reviewed: 10/29/2016 Elsevier Interactive Patient Education  2019 [place] Anesthesia is a term that refers to techniques, procedures, and  medicines that help a person stay safe and comfortable during a medical procedure. Monitored anesthesia care, or sedation, is one type of anesthesia. Your anesthesia specialist may recommend sedation if you will be having a procedure that does not require you to be unconscious, such as:  Cataract surgery.  A dental procedure.  A biopsy.  A colonoscopy. During the procedure, you may receive a medicine to help you relax (sedative). There are three levels of sedation:  Mild sedation. At this level, you may feel awake and relaxed. You will be able to follow directions.  Moderate sedation. At this level, you will be sleepy. You may not remember the procedure.  Deep sedation. At this level, you will be asleep. You will not remember the procedure. The more medicine you are given, the deeper your level of sedation will be. Depending on how you respond to the procedure, the anesthesia specialist may change your level of sedation or the type of anesthesia to fit your needs. An anesthesia specialist will monitor you closely during the procedure. Let your health care provider know about:  Any allergies you have.  All medicines you are taking, including vitamins, herbs, eye drops, creams, and over-the-counter medicines.  Any use of steroids (by mouth or as a cream).  Any problems you or family members have had with sedatives and anesthetic medicines.  Any blood disorders you have.  Any surgeries you have had.  Any medical conditions you have, such as sleep apnea.  Whether you are pregnant or may be pregnant.  Any use of cigarettes, alcohol, or street drugs. What are the risks? Generally, this is a safe procedure. However, problems may occur, including:  Getting too much medicine (oversedation).  Nausea.  Allergic reaction to medicines.  Trouble breathing. If this happens, a breathing tube may be used to help with breathing. It will be removed when you are awake and breathing on your  own.  Heart trouble.  Lung trouble. Before the procedure Staying hydrated Follow instructions from your health care provider about hydration, which may include:  Up to 2 hours before the procedure - you may continue to drink clear liquids, such as water, clear fruit juice, black coffee, and plain tea. Eating and drinking restrictions Follow instructions from your health care provider about eating and drinking, which may include:  8 hours before the procedure - stop eating heavy meals or foods such as meat, fried foods, or fatty foods.  6 hours before the procedure - stop eating light meals or foods, such as toast or cereal.  6 hours before the procedure - stop drinking milk or drinks that contain milk.  2 hours before the procedure - stop drinking clear liquids. Medicines Ask your health care provider about:  Changing or stopping your regular medicines. This is especially important if you are taking diabetes medicines or blood thinners.  Taking medicines such as aspirin and ibuprofen. These medicines can thin your blood. Do not take these medicines before your procedure if your health care provider instructs you not to. Tests and exams  You will have a physical exam.  You may have blood tests done to show: ? How well your kidneys and liver are working. ? How well your blood can clot. General instructions  Plan to have someone take you home from the hospital or clinic.  If you will be going home right after the procedure, plan to have someone with you for 24 hours.  What happens during the procedure?  Your blood pressure, heart rate, breathing, level of pain and overall condition will be monitored.  An IV tube will be inserted into one of your veins.  Your anesthesia specialist will give you medicines as needed to keep you comfortable during the procedure. This may mean changing the level of sedation.  The procedure will be performed. After the procedure  Your blood  pressure, heart rate, breathing rate, and blood oxygen level will be monitored until the medicines you were given have worn off.  Do not drive for 24 hours if you received a sedative.  You may: ? Feel sleepy, clumsy, or nauseous. ? Feel forgetful about what happened after the procedure. ? Have a sore throat if you had a breathing tube during the procedure. ? Vomit. This information is not intended to replace advice given to you by your health care provider. Make sure you discuss any questions you have with your health care provider. Document Released: 11/15/2004 Document Revised: 07/29/2015 Document Reviewed: 06/12/2015 Elsevier Interactive Patient Education  2019 [place], Care After These instructions provide you with information about caring for yourself after your procedure. Your health care provider may also give you more specific instructions. Your treatment has been planned according to current medical practices, but problems sometimes occur. Call your health care provider if you have any problems or questions after your procedure. What can I expect after the procedure? After your procedure, you may:  Feel sleepy for several hours.  Feel clumsy and have poor balance for several hours.  Feel forgetful about what happened after the procedure.  Have poor judgment for several hours.  Feel nauseous or vomit.  Have a sore throat if you had a breathing tube during the procedure. Follow these instructions at home: For at least 24 hours after the procedure:      Have a responsible adult stay with you. It is important to have someone help care for you until you  are awake and alert.  Rest as needed.  Do not: ? Participate in activities in which you could fall or become injured. ? Drive. ? Use heavy machinery. ? Drink alcohol. ? Take sleeping pills or medicines that cause drowsiness. ? Make important decisions or sign legal documents. ? Take  care of children on your own. Eating and drinking  Follow the diet that is recommended by your health care provider.  If you vomit, drink water, juice, or soup when you can drink without vomiting.  Make sure you have little or no nausea before eating solid foods. General instructions  Take over-the-counter and prescription medicines only as told by your health care provider.  If you have sleep apnea, surgery and certain medicines can increase your risk for breathing problems. Follow instructions from your health care provider about wearing your sleep device: ? Anytime you are sleeping, including during daytime naps. ? While taking prescription pain medicines, sleeping medicines, or medicines that make you drowsy.  If you smoke, do not smoke without supervision.  Keep all follow-up visits as told by your health care provider. This is important. Contact a health care provider if:  You keep feeling nauseous or you keep vomiting.  You feel light-headed.  You develop a rash.  You have a fever. Get help right away if:  You have trouble breathing. Summary  For several hours after your procedure, you may feel sleepy and have poor judgment.  Have a responsible adult stay with you for at least 24 hours or until you are awake and alert. This information is not intended to replace advice given to you by your health care provider. Make sure you discuss any questions you have with your health care provider. Document Released: 06/12/2015 Document Revised: 10/05/2016 Document Reviewed: 06/12/2015 Elsevier Interactive Patient Education  2019 [employer].

## 2018-03-07 ENCOUNTER — Encounter (HOSPITAL_COMMUNITY): Payer: Self-pay

## 2018-03-07 ENCOUNTER — Other Ambulatory Visit: Payer: Self-pay

## 2018-03-07 ENCOUNTER — Encounter (HOSPITAL_COMMUNITY)
Admission: RE | Admit: 2018-03-07 | Discharge: 2018-03-07 | Disposition: A | Discharge status: Home / Self Care | Payer: Medicare Other | Source: Ambulatory Visit | Attending: Orthopedic Surgery | Admitting: Orthopedic Surgery

## 2018-03-07 DIAGNOSIS — Z01812 Encounter for preprocedural laboratory examination: Secondary | ICD-10-CM | POA: Insufficient documentation

## 2018-03-12 NOTE — H&P (Signed)
Chief Complaint  Patient presents with  . Hand Pain      Right hand pain and numbness.      43 years old had a left carpal tunnel release did well comes in complaining of pain paresthesias numbness and tingling right hand primarily index long and part of the ring finger with some mild elbow pain and pain radiating to the elbow over the wrist.  She wore braces it did not help no specific modifying factors seems to be worse at night no improvement over the last 6 weeks even with the braces     Review of Systems  Constitutional: Negative for chills and fever.  Musculoskeletal: Positive for joint pain and myalgias.            Past Medical History:  Diagnosis Date  . Anxiety    . Bipolar 1 disorder ([place])    . Heart murmur    . Migraines    . Schizophrenia [facility])             Past Surgical History:  Procedure Laterality Date  . arm surgery Right      tendonitis-wrist  . BILATERAL SALPINGECTOMY Bilateral 08/29/2016    Procedure: BILATERAL SALPINGECTOMY;  Surgeon: Florian Buff, MD;  Location: [facility];  Service: Gynecology;  Laterality: Bilateral;  . CARPAL TUNNEL RELEASE Left 04/11/2016    Procedure: CARPAL TUNNEL RELEASE;  Surgeon: [name], MD;  Location: [facility];  Service: Orthopedics;  Laterality: Left;  . SUPRACERVICAL ABDOMINAL HYSTERECTOMY N/A 08/29/2016    Procedure: HYSTERECTOMY SUPRACERVICAL ABDOMINAL;  Surgeon: Florian Buff, MD;  Location: [facility];  Service: Gynecology;  Laterality: N/A;  . TUBAL LIGATION               Family History  Problem Relation Age of Onset  . Cancer Mother 72        breast CA at 65 and uterine CA at 48  . Hypertension Mother    . Kidney disease Mother          kidney transplant  . Cancer Sister 82        uterine  . Cancer Sister 1        uterine  . Diabetes Father    . Liver disease Father    . Cancer Maternal Aunt          breast    Social History         Tobacco Use  . Smoking status: Former Smoker      Packs/day: 0.50       Years: 7.00      Pack years: 3.50      Types: Cigarettes  . Smokeless tobacco: Never Used  . Tobacco comment: smokes 2 cig daily  Substance Use Topics  . Alcohol use: Yes  . Drug use: Yes      Types: Marijuana      Comment: once a week           Allergies  Allergen Reactions  . Imitrex [Sumatriptan] Anaphylaxis      Could not swallow after taking med  . Latuda [Lurasidone Hcl]        Suicidal thoughts    . Wellbutrin [Bupropion] Other (See Comments)      Seizures / lowered seizure threshold  . Zoloft [Sertraline Hcl] Hives  . Haloperidol And Related        Leg cramps  . Nicoderm [Nicotine] Dermatitis      Active Medications  No outpatient  medications have been marked as taking for the 01/27/18 encounter (Office Visit) with [name], MD.        BP 109/76   Pulse 68   Ht 5\' 3"  (1.6 m)   Wt 185 lb (83.9 kg)   LMP 08/10/2016   BMI 32.77 kg/m    Physical Exam  Constitutional: She is oriented to person, place, and time. She appears well-developed and well-nourished.  Neurological: She is alert and oriented to person, place, and time.  Psychiatric: She has a normal mood and affect. Judgment normal.  Vitals reviewed.     Ortho Exam Left wrist carpal tunnel incision she still has some residual sensory difference on the tip of the long finger and ring finger full range of motion and stability are recorded strength is normal skin is intact pulses are good lymph nodes are negative soft touch sensation is normal   On the right side she has an incision from a de Quervain's release her carpal tunnel is tender she has tenderness over the medial elbow elbow without radiation full range of motion of the wrist and elbow are noted stability tests were normal strength was intact skin was normal pulses were good sensory loss in the index long and part of the ring finger   Equal reflexes bilaterally at the elbow and wrist     MEDICAL DECISION SECTION  Xrays were  done at N/A   My independent reading of xrays:  N/A       Encounter Diagnosis  Name Primary?  . Carpal tunnel syndrome of right wrist Yes      PLAN: (Rx., injectx, surgery, frx, mri/ct) RT CTR    The procedure has been fully reviewed with the patient; The risks and benefits of surgery have been discussed and explained and understood. Alternative treatment has also been reviewed, questions were encouraged and answered. The postoperative plan is also been reviewed.     No orders of the defined types were placed in this encounter.     Arther Abbott, MD

## 2018-03-13 ENCOUNTER — Encounter (HOSPITAL_COMMUNITY): Payer: Self-pay | Admitting: *Deleted

## 2018-03-13 ENCOUNTER — Ambulatory Visit (HOSPITAL_COMMUNITY): Payer: Medicare Other | Admitting: Anesthesiology

## 2018-03-13 ENCOUNTER — Ambulatory Visit (HOSPITAL_COMMUNITY)
Admission: RE | Admit: 2018-03-13 | Discharge: 2018-03-13 | Disposition: A | Discharge status: Home / Self Care | Payer: Medicare Other | Attending: Orthopedic Surgery | Admitting: Orthopedic Surgery

## 2018-03-13 ENCOUNTER — Encounter (HOSPITAL_COMMUNITY)
Admission: RE | Disposition: A | Discharge status: Home / Self Care | Payer: Self-pay | Source: Home / Self Care | Attending: Orthopedic Surgery

## 2018-03-13 DIAGNOSIS — E669 Obesity, unspecified: Secondary | ICD-10-CM | POA: Insufficient documentation

## 2018-03-13 DIAGNOSIS — Z8249 Family history of ischemic heart disease and other diseases of the circulatory system: Secondary | ICD-10-CM | POA: Insufficient documentation

## 2018-03-13 DIAGNOSIS — Z87891 Personal history of nicotine dependence: Secondary | ICD-10-CM | POA: Diagnosis not present

## 2018-03-13 DIAGNOSIS — Z6832 Body mass index (BMI) 32.0-32.9, adult: Secondary | ICD-10-CM | POA: Diagnosis not present

## 2018-03-13 DIAGNOSIS — K219 Gastro-esophageal reflux disease without esophagitis: Secondary | ICD-10-CM | POA: Diagnosis not present

## 2018-03-13 DIAGNOSIS — G5601 Carpal tunnel syndrome, right upper limb: Secondary | ICD-10-CM | POA: Diagnosis not present

## 2018-03-13 DIAGNOSIS — M79641 Pain in right hand: Secondary | ICD-10-CM | POA: Diagnosis present

## 2018-03-13 DIAGNOSIS — I10 Essential (primary) hypertension: Secondary | ICD-10-CM | POA: Diagnosis not present

## 2018-03-13 HISTORY — PX: CARPAL TUNNEL RELEASE: SHX101

## 2018-03-13 SURGERY — CARPAL TUNNEL RELEASE
Anesthesia: Regional | Site: Hand | Laterality: Right

## 2018-03-13 MED ORDER — CHLORHEXIDINE GLUCONATE 4 % EX LIQD: 60.0000 mL | Freq: Once | CUTANEOUS | Status: DC

## 2018-03-13 MED ORDER — BUPIVACAINE HCL (PF) 0.5 % IJ SOLN
INTRAMUSCULAR | Status: DC | PRN
  Administered 2018-03-13: 10 mL

## 2018-03-13 MED ORDER — HYDROCODONE-ACETAMINOPHEN 7.5-325 MG PO TABS: 1.0000 | ORAL_TABLET | Freq: Once | ORAL | Status: DC | PRN

## 2018-03-13 MED ORDER — LACTATED RINGERS IV SOLN
INTRAVENOUS | Status: DC | PRN
  Administered 2018-03-13: 08:00:00 via INTRAVENOUS

## 2018-03-13 MED ORDER — 0.9 % SODIUM CHLORIDE (POUR BTL) OPTIME
TOPICAL | Status: DC | PRN
  Administered 2018-03-13: 1000 mL

## 2018-03-13 MED ORDER — CEFAZOLIN SODIUM-DEXTROSE 2-4 GM/100ML-% IV SOLN
2.0000 g | INTRAVENOUS | Status: AC
  Administered 2018-03-13: 2 g via INTRAVENOUS

## 2018-03-13 MED ORDER — CEFAZOLIN SODIUM-DEXTROSE 2-4 GM/100ML-% IV SOLN
INTRAVENOUS | Status: AC
  Filled 2018-03-13: qty 100

## 2018-03-13 MED ORDER — MIDAZOLAM HCL 5 MG/5ML IJ SOLN
INTRAMUSCULAR | Status: DC | PRN
  Administered 2018-03-13: 2 mg via INTRAVENOUS

## 2018-03-13 MED ORDER — FENTANYL CITRATE (PF) 100 MCG/2ML IJ SOLN
INTRAMUSCULAR | Status: AC
  Filled 2018-03-13: qty 2

## 2018-03-13 MED ORDER — PROPOFOL 10 MG/ML IV BOLUS
INTRAVENOUS | Status: AC
  Filled 2018-03-13: qty 40

## 2018-03-13 MED ORDER — GLYCOPYRROLATE PF 0.2 MG/ML IJ SOSY
PREFILLED_SYRINGE | INTRAMUSCULAR | Status: AC
  Filled 2018-03-13: qty 1

## 2018-03-13 MED ORDER — BUPIVACAINE HCL (PF) 0.5 % IJ SOLN
INTRAMUSCULAR | Status: AC
  Filled 2018-03-13: qty 30

## 2018-03-13 MED ORDER — LIDOCAINE HCL (PF) 0.5 % IJ SOLN
INTRAMUSCULAR | Status: DC | PRN
  Administered 2018-03-13: 46 mg via PERINEURAL

## 2018-03-13 MED ORDER — LACTATED RINGERS IV SOLN: INTRAVENOUS | Status: DC

## 2018-03-13 MED ORDER — FENTANYL CITRATE (PF) 250 MCG/5ML IJ SOLN
INTRAMUSCULAR | Status: DC | PRN
  Administered 2018-03-13: 50 ug via INTRAVENOUS

## 2018-03-13 MED ORDER — MEPERIDINE HCL 50 MG/ML IJ SOLN: 6.2500 mg | INTRAMUSCULAR | Status: DC | PRN

## 2018-03-13 MED ORDER — MIDAZOLAM HCL 2 MG/2ML IJ SOLN
INTRAMUSCULAR | Status: AC
  Filled 2018-03-13: qty 2

## 2018-03-13 MED ORDER — LIDOCAINE HCL (PF) 0.5 % IJ SOLN
INTRAMUSCULAR | Status: AC
  Filled 2018-03-13: qty 50

## 2018-03-13 MED ORDER — PROPOFOL 500 MG/50ML IV EMUL
INTRAVENOUS | Status: DC | PRN
  Administered 2018-03-13: 50 ug/kg/min via INTRAVENOUS

## 2018-03-13 MED ORDER — GLYCOPYRROLATE 0.2 MG/ML IJ SOLN
INTRAMUSCULAR | Status: DC | PRN
  Administered 2018-03-13: 0.2 mg via INTRAVENOUS

## 2018-03-13 MED ORDER — HYDROMORPHONE HCL 1 MG/ML IJ SOLN: 0.2500 mg | INTRAMUSCULAR | Status: DC | PRN

## 2018-03-13 MED ORDER — ACETAMINOPHEN-CODEINE #3 300-30 MG PO TABS: 1.0000 | ORAL_TABLET | Freq: Four times a day (QID) | ORAL | 0 refills | Status: DC | PRN

## 2018-03-13 MED ORDER — FENTANYL CITRATE (PF) 100 MCG/2ML IJ SOLN
INTRAMUSCULAR | Status: DC | PRN
  Administered 2018-03-13 (×2): 50 ug via INTRAVENOUS

## 2018-03-13 MED ORDER — BUPIVACAINE HCL (PF) 0.25 % IJ SOLN
INTRAMUSCULAR | Status: AC
  Filled 2018-03-13: qty 30

## 2018-03-13 MED ORDER — PROMETHAZINE HCL 25 MG/ML IJ SOLN: 6.2500 mg | INTRAMUSCULAR | Status: DC | PRN

## 2018-03-13 SURGICAL SUPPLY — 39 items
BANDAGE ELASTIC 3 LF NS (GAUZE/BANDAGES/DRESSINGS) ×2 IMPLANT
BANDAGE ESMARK 4X12 BL STRL LF (DISPOSABLE) ×1 IMPLANT
BLADE SURG 15 STRL LF DISP TIS (BLADE) ×1 IMPLANT
BLADE SURG 15 STRL SS (BLADE) ×1
BNDG COHESIVE 4X5 TAN STRL (GAUZE/BANDAGES/DRESSINGS) ×2 IMPLANT
BNDG ESMARK 4X12 BLUE STRL LF (DISPOSABLE) ×2
BNDG GAUZE ELAST 4 BULKY (GAUZE/BANDAGES/DRESSINGS) ×2 IMPLANT
CHLORAPREP W/TINT 26ML (MISCELLANEOUS) ×2 IMPLANT
CLOTH BEACON ORANGE TIMEOUT ST (SAFETY) ×2 IMPLANT
COVER LIGHT HANDLE STERIS (MISCELLANEOUS) ×4 IMPLANT
COVER WAND RF STERILE (DRAPES) ×1 IMPLANT
CUFF TOURNIQUET SINGLE 18IN (TOURNIQUET CUFF) ×2 IMPLANT
DECANTER SPIKE VIAL GLASS SM (MISCELLANEOUS) ×2 IMPLANT
DRAPE PROXIMA HALF (DRAPES) ×2 IMPLANT
DRSG XEROFORM 1X8 (GAUZE/BANDAGES/DRESSINGS) ×2 IMPLANT
ELECT NDL TIP 2.8 STRL (NEEDLE) ×1 IMPLANT
ELECT NEEDLE TIP 2.8 STRL (NEEDLE) ×2 IMPLANT
ELECT REM PT RETURN 9FT ADLT (ELECTROSURGICAL) ×2
ELECTRODE REM PT RTRN 9FT ADLT (ELECTROSURGICAL) ×1 IMPLANT
GAUZE SPONGE 4X4 12PLY STRL (GAUZE/BANDAGES/DRESSINGS) ×2 IMPLANT
GLOVE BIOGEL PI IND STRL 7.0 (GLOVE) ×1 IMPLANT
GLOVE BIOGEL PI INDICATOR 7.0 (GLOVE) ×1
GLOVE SKINSENSE NS SZ8.0 LF (GLOVE) ×1
GLOVE SKINSENSE STRL SZ8.0 LF (GLOVE) ×1 IMPLANT
GLOVE SS N UNI LF 8.5 STRL (GLOVE) ×2 IMPLANT
GOWN STRL REUS W/ TWL LRG LVL3 (GOWN DISPOSABLE) ×1 IMPLANT
GOWN STRL REUS W/TWL LRG LVL3 (GOWN DISPOSABLE) ×1
GOWN STRL REUS W/TWL XL LVL3 (GOWN DISPOSABLE) ×2 IMPLANT
KIT TURNOVER KIT A (KITS) ×2 IMPLANT
MANIFOLD NEPTUNE II (INSTRUMENTS) ×2 IMPLANT
NDL HYPO 21X1.5 SAFETY (NEEDLE) ×1 IMPLANT
NEEDLE HYPO 21X1.5 SAFETY (NEEDLE) ×2 IMPLANT
NS IRRIG 1000ML POUR BTL (IV SOLUTION) ×2 IMPLANT
PACK BASIC LIMB (CUSTOM PROCEDURE TRAY) ×2 IMPLANT
PAD ARMBOARD 7.5X6 YLW CONV (MISCELLANEOUS) ×2 IMPLANT
POSITIONER HAND ALUMI XLG (MISCELLANEOUS) ×2 IMPLANT
SET BASIN LINEN APH (SET/KITS/TRAYS/PACK) ×2 IMPLANT
SUT ETHILON 3 0 FSL (SUTURE) ×3 IMPLANT
SYR CONTROL 10ML LL (SYRINGE) ×2 IMPLANT

## 2018-03-13 NOTE — Anesthesia Procedure Notes (Signed)
Procedure Name: MAC Date/Time: 03/13/2018 8:18 AM Performed by: Andree Elk Amy A, CRNA Pre-anesthesia Checklist: Patient identified, Emergency Drugs available, Suction available, Timeout performed and Patient being monitored Patient Re-evaluated:Patient Re-evaluated prior to induction Oxygen Delivery Method: Nasal Cannula

## 2018-03-13 NOTE — Anesthesia Postprocedure Evaluation (Signed)
Anesthesia Post Note  Patient: Stacy Wolf  Procedure(s) Performed: RIGHT CARPAL TUNNEL RELEASE (Right Hand)  Patient location during evaluation: PACU Anesthesia Type: Bier Block Level of consciousness: awake and alert and oriented Pain management: pain level controlled Vital Signs Assessment: post-procedure vital signs reviewed and stable Respiratory status: spontaneous breathing Cardiovascular status: stable Postop Assessment: no apparent nausea or vomiting Anesthetic complications: no     Last Vitals:  Vitals:   03/13/18 0753  BP: 126/79  Resp: 18  Temp: 36.8 C  SpO2: 97%    Last Pain:  Vitals:   03/13/18 0753  TempSrc: Oral  PainSc: 0-No pain                 Onix Jumper A

## 2018-03-13 NOTE — Anesthesia Procedure Notes (Signed)
Anesthesia Regional Block: Bier block (IV Regional)   Pre-Anesthetic Checklist: ,, timeout performed, Correct Patient, Correct Site, Correct Laterality, Correct Procedure,, site marked, surgical consent,, at surgeon's request  Laterality: Right     Needles:  Injection technique: Single-shot  Needle Type: Other      Needle Gauge: 22     Additional Needles:   Procedures:,,,,, intact distal pulses, Esmarch exsanguination, single tourniquet utilized,   Nerve Stimulator or Paresthesia:   Additional Responses:  Pulse checked post tourniquet inflation. IV NSL discontinued post injection. Narrative:   Performed by: Personally       

## 2018-03-13 NOTE — Anesthesia Preprocedure Evaluation (Signed)
Anesthesia Evaluation    Airway Mallampati: II   Neck ROM: full    Dental  (+) Chipped   Pulmonary former smoker,    breath sounds clear to auscultation       Cardiovascular hypertension,  Rhythm:regular     Neuro/Psych Anxiety Bipolar Disorder Schizophrenia    GI/Hepatic GERD  ,  Endo/Other    Renal/GU      Musculoskeletal   Abdominal   Peds  Hematology   Anesthesia Other Findings Obesity MJ use/abuse- regular basis Conduct disorder? Chipped upper, teeth, prominent   Reproductive/Obstetrics                             Anesthesia Physical Anesthesia Plan  ASA: III  Anesthesia Plan: Bier Block and Bier Block-Lidocaine Only   Post-op Pain Management:    Induction:   PONV Risk Score and Plan:   Airway Management Planned:   Additional Equipment:   Intra-op Plan:   Post-operative Plan:   Informed Consent: I have reviewed the patients History and Physical, chart, labs and discussed the procedure including the risks, benefits and alternatives for the proposed anesthesia with the patient or authorized representative who has indicated his/her understanding and acceptance.     Plan Discussed with: Anesthesiologist  Anesthesia Plan Comments:         Anesthesia Quick Evaluation

## 2018-03-13 NOTE — Transfer of Care (Signed)
Immediate Anesthesia Transfer of Care Note  Patient: Stacy Wolf  Procedure(s) Performed: RIGHT CARPAL TUNNEL RELEASE (Right Hand)  Patient Location: PACU  Anesthesia Type:MAC and Bier block  Level of Consciousness: awake, alert , oriented and patient cooperative  Airway & Oxygen Therapy: Patient Spontanous Breathing  Post-op Assessment: Report given to RN and Post -op Vital signs reviewed and stable  Post vital signs: Reviewed and stable  Last Vitals:  Vitals Value Taken Time  BP    Temp    Pulse 59 03/13/2018  8:59 AM  Resp 13 03/13/2018  8:59 AM  SpO2 100 % 03/13/2018  8:59 AM  Vitals shown include unvalidated device data.  Last Pain:  Vitals:   03/13/18 0753  TempSrc: Oral  PainSc: 0-No pain      Patients Stated Pain Goal: 8 (33/35/45 6256)  Complications: No apparent anesthesia complications

## 2018-03-13 NOTE — Interval H&P Note (Signed)
History and Physical Interval Note:  03/13/2018 7:48 AM  Stacy Wolf  has presented today for surgery, with the diagnosis of right carpal tunnel syndrome  The various methods of treatment have been discussed with the patient and family. After consideration of risks, benefits and other options for treatment, the patient has consented to  Procedure(s): CARPAL TUNNEL RELEASE (Right) as a surgical intervention .  The patient's history has been reviewed, patient examined, no change in status, stable for surgery.  I have reviewed the patient's chart and labs.  Questions were answered to the patient's satisfaction.     Arther Abbott

## 2018-03-13 NOTE — Brief Op Note (Signed)
03/13/2018  8:51 AM  PATIENT:  Stacy Wolf  43 y.o. female  PRE-OPERATIVE DIAGNOSIS:  right carpal tunnel syndrome  POST-OPERATIVE DIAGNOSIS:  right carpal tunnel syndrome  PROCEDURE:  Procedure(s): RIGHT CARPAL TUNNEL RELEASE [phone]  SURGEON:  Surgeon(s) and Role:    [name], MD - Primary  Carpal tunnel release right wrist  Preop diagnosis carpal tunnel syndrome right wrist postop diagnosis same  Procedure open carpal tunnel release right wrist  Surgeon Aline Brochure  Anesthesia regional Bier block  Operative findings compression of the right median nerveoccupied lesions  Indications failure of conservative treatment to relieve pain and paresthesias and numbness and tingling of the right hand.  The patient was identified in the preop area we confirm the surgical site marked as right wrist. Chart update completed. Patient taken to surgery. She had 2 g of Ancef. After establishing a Bier block her arm was prepped with ChloraPrep.  Timeout executed completed and confirmed site.  A straight incision was made over the carpal tunnel in line with the radial border of the ring finger. Blunt dissection was carried out to find the distal aspect of the carpal tunnel. A blunted judgment was passed beneath the carpal tunnel. Sharp incision was then used to release the transverse carpal ligament. The contents of the carpal tunnel were inspected. The median nerve was compressed with slight discoloration.  The wound was irrigated and then closed with 3-0 nylon suture. We injected 10 mL of plain Marcaine on the radial side of the incision  A sterile bandage was applied and the tourniquet was released the color of the hand and capillary refill were normal  The patient was taken to the recovery room in stable condition

## 2018-03-13 NOTE — Discharge Instructions (Signed)
Elevate the hand for 72 hours.  Apply ice packs as needed to control swelling.  Moves your fingers every hour opening and closing the hand to make a closed fist 10 times per hour while awake Keep dressing clean and dry doctor will change in the office   Open Carpal Tunnel Release, Care After This sheet gives you information about how to care for yourself after your procedure. Your health care provider may also give you more specific instructions. If you have problems or questions, contact your health care provider. What can I expect after the procedure? After the procedure, it is common to have:  Wrist stiffness.  Bruising. Follow these instructions at home: Bathing  Do not take baths, swim, or use a hot tub until your health care provider approves. Ask your health care provider if you may take showers.  Keep your bandage (dressing) dry until your health care provider says it can be removed. If you have a splint or brace:  Wear the splint or brace as told by your health care provider. You may need to wear it for 2-3 weeks. Remove it only as told by your health care provider.  Loosen the splint or brace if your fingers tingle, become numb, or turn cold and blue.  Keep the splint or brace clean.  If the splint or brace is not waterproof: ? Do not let it get wet. ? Cover it with a watertight covering when you take a bath or a shower. Incision care   Follow instructions from your health care provider about how to take care of your incision. Make sure you: ? Wash your hands with soap and water before you change your dressing. If soap and water are not available, use hand sanitizer. ? Change your dressing as told by your health care provider. ? Leave stitches (sutures), skin glue, or adhesive strips in place. These skin closures may need to stay in place for 2 weeks or longer. If adhesive strip edges start to loosen and curl up, you may trim the loose edges. Do not remove adhesive  strips completely unless your health care provider tells you to do that.  Check your incision area every day for signs of infection. Check for: ? Redness, swelling, or pain. ? Fluid or blood. ? Warmth. ? Pus or a bad smell. Managing pain, stiffness, and swelling   If directed, put ice on the affected area. ? If you have a removable splint or brace, remove it as told by your health care provider. ? Put ice in a plastic bag. ? Place a towel between your skin and the bag. ? Leave the ice on for 20 minutes, 2-3 times a day.  Move your fingers often to avoid stiffness and to lessen swelling.  Raise (elevate) your wrist above the level of your heart while you are sitting or lying down. Activity  Do not drive until your health care provider approves.  Do not drive or use heavy machinery while taking prescription pain medicine.  Return to your normal activities as told by your health care provider. Avoid activities that cause pain.  If physical therapy was prescribed, do exercises as told by your therapist. Physical therapy can help you heal faster and regain movement. General instructions  Take over-the-counter and prescription medicines only as told by your health care provider.  If you are taking prescription pain medicine, take actions to prevent or treat constipation. Your health care provider may recommend that you: ? Drink enough fluid to  keep your urine pale yellow. ? Eat foods that are high in fiber, such as fresh fruits and vegetables, whole grains, and beans. ? Limit foods that are high in fat and processed sugars, such as fried or sweet foods. ? Take an over-the-counter or prescription medicine for constipation.  Do not use any products that contain nicotine or tobacco, such as cigarettes and e-cigarettes. If you need help quitting, ask your health care provider.  Keep all follow-up visits as told by your health care provider and physical therapist. This is  important. Contact a health care provider if:  You have redness or swelling around your incision.  You have fluid or blood coming from your incision.  Your incision feels warm to the touch.  You have pus or a bad smell coming from your incision.  You have a fever.  You have chills.  You have pain that does not get better with medicine.  Your carpal tunnel symptoms do not go away after 2 months.  Your carpal tunnel symptoms go away and then come back. Get help right away if:  You have pain or numbness that is getting worse.  Your fingers or fingertips become very pale or bluish in color.  You are not able to move your fingers. Summary  It is common to have wrist stiffness and bruising after a carpal tunnel release.  Icing and raising (elevating) your wrist may help to lessen swelling and pain.  Call your health care provider if you have a fever or notice any signs of infection in your incision area. This information is not intended to replace advice given to you by your health care provider. Make sure you discuss any questions you have with your health care provider. Document Released: 09/08/2004 Document Revised: 10/29/2016 Document Reviewed: 10/29/2016 Elsevier Interactive Patient Education  2019 [employer] Anesthesia, Adult, Care After This sheet gives you information about how to care for yourself after your procedure. Your health care provider may also give you more specific instructions. If you have problems or questions, contact your health care provider. What can I expect after the procedure? After the procedure, the following side effects are common:  Pain or discomfort at the IV site.  Nausea.  Vomiting.  Sore throat.  Trouble concentrating.  Feeling cold or chills.  Weak or tired.  Sleepiness and fatigue.  Soreness and body aches. These side effects can affect parts of the body that were not involved in surgery. Follow these  instructions at home:  For at least 24 hours after the procedure:  Have a responsible adult stay with you. It is important to have someone help care for you until you are awake and alert.  Rest as needed.  Do not: ? Participate in activities in which you could fall or become injured. ? Drive. ? Use heavy machinery. ? Drink alcohol. ? Take sleeping pills or medicines that cause drowsiness. ? Make important decisions or sign legal documents. ? Take care of children on your own. Eating and drinking  Follow any instructions from your health care provider about eating or drinking restrictions.  When you feel hungry, start by eating small amounts of foods that are soft and easy to digest (bland), such as toast. Gradually return to your regular diet.  Drink enough fluid to keep your urine pale yellow.  If you vomit, rehydrate by drinking water, juice, or clear broth. General instructions  If you have sleep apnea, surgery and certain medicines can increase your  risk for breathing problems. Follow instructions from your health care provider about wearing your sleep device: ? Anytime you are sleeping, including during daytime naps. ? While taking prescription pain medicines, sleeping medicines, or medicines that make you drowsy.  Return to your normal activities as told by your health care provider. Ask your health care provider what activities are safe for you.  Take over-the-counter and prescription medicines only as told by your health care provider.  If you smoke, do not smoke without supervision.  Keep all follow-up visits as told by your health care provider. This is important. Contact a health care provider if:  You have nausea or vomiting that does not get better with medicine.  You cannot eat or drink without vomiting.  You have pain that does not get better with medicine.  You are unable to pass urine.  You develop a skin rash.  You have a fever.  You have redness  around your IV site that gets worse. Get help right away if:  You have difficulty breathing.  You have chest pain.  You have blood in your urine or stool, or you vomit blood. Summary  After the procedure, it is common to have a sore throat or nausea. It is also common to feel tired.  Have a responsible adult stay with you for the first 24 hours after general anesthesia. It is important to have someone help care for you until you are awake and alert.  When you feel hungry, start by eating small amounts of foods that are soft and easy to digest (bland), such as toast. Gradually return to your regular diet.  Drink enough fluid to keep your urine pale yellow.  Return to your normal activities as told by your health care provider. Ask your health care provider what activities are safe for you. This information is not intended to replace advice given to you by your health care provider. Make sure you discuss any questions you have with your health care provider. Document Released: 05/28/2000 Document Revised: 10/05/2016 Document Reviewed: 10/05/2016 Elsevier Interactive Patient Education  2019 [employer].

## 2018-03-13 NOTE — Op Note (Addendum)
03/13/2018 8:53 AM  Carpal tunnel release right wrist  Preop diagnosis carpal tunnel syndrome right wrist postop diagnosis same  Procedure open carpal tunnel release right wrist  Surgeon Aline Brochure  Anesthesia regional Bier block  Operative findings compression of the right median nerveoccupied lesions  Indications failure of conservative treatment to relieve pain and paresthesias and numbness and tingling of the right hand.  The patient was identified in the preop area we confirm the surgical site marked as right wrist. Chart update completed. Patient taken to surgery. She had 2 g of Ancef. After establishing a Bier block her arm was prepped with ChloraPrep.  Timeout executed completed and confirmed site.  A straight incision was made over the carpal tunnel in line with the radial border of the ring finger. Blunt dissection was carried out to find the distal aspect of the carpal tunnel. A blunted judgment was passed beneath the carpal tunnel. Sharp incision was then used to release the transverse carpal ligament. The contents of the carpal tunnel were inspected. The median nerve was compressed with slight discoloration.  The wound was irrigated and then closed with 3-0 nylon suture. We injected 10 mL of plain Marcaine on the radial side of the incision  A sterile bandage was applied and the tourniquet was released the color of the hand and capillary refill were normal  The patient was taken to the recovery room in stable condition

## 2018-03-14 ENCOUNTER — Encounter (HOSPITAL_COMMUNITY): Payer: Self-pay | Admitting: Orthopedic Surgery

## 2018-03-17 ENCOUNTER — Telehealth: Payer: Self-pay | Admitting: Orthopedic Surgery

## 2018-03-17 NOTE — Telephone Encounter (Signed)
I have called her to advise.  

## 2018-03-17 NOTE — Telephone Encounter (Signed)
Received voice message from patient which she had left after clinic hours Friday, 03/14/18. States that the pain medication is not helping enough with the pain; said on message she was taking as prescribed-1 tablet every 6 hours.  I called back to patient this morning, Monday, 03/17/18 as soon as message received. States she began taking 2 of the tablets at a time of medication, and said still hurting.  Pharmacy is [facility] in [place] acetaminophen-codeine (TYLENOL #3) 300-30 MG tablet 30 tablet   Please advise. Patient [phone]

## 2018-03-17 NOTE — Telephone Encounter (Signed)
Take 800 mg of ibuprofen with it every 8 hrs

## 2018-03-26 ENCOUNTER — Ambulatory Visit: Payer: Medicare Other | Admitting: Orthopedic Surgery

## 2018-03-28 DIAGNOSIS — Z9889 Other specified postprocedural states: Secondary | ICD-10-CM | POA: Insufficient documentation

## 2018-03-31 ENCOUNTER — Encounter: Payer: Self-pay | Admitting: Orthopedic Surgery

## 2018-03-31 ENCOUNTER — Ambulatory Visit (INDEPENDENT_AMBULATORY_CARE_PROVIDER_SITE_OTHER): Payer: Medicare Other | Admitting: Orthopedic Surgery

## 2018-03-31 VITALS — BP 121/82 | HR 79 | Ht 63.0 in | Wt 183.0 lb

## 2018-03-31 DIAGNOSIS — Z9889 Other specified postprocedural states: Secondary | ICD-10-CM

## 2018-03-31 MED ORDER — IBUPROFEN 800 MG PO TABS: 800.0000 mg | ORAL_TABLET | Freq: Three times a day (TID) | ORAL | 1 refills | Status: DC | PRN

## 2018-03-31 MED ORDER — HYDROCODONE-ACETAMINOPHEN 5-325 MG PO TABS: 1.0000 | ORAL_TABLET | ORAL | 0 refills | Status: AC | PRN

## 2018-03-31 NOTE — Patient Instructions (Signed)
Start bending the fingers more   Elevate if throbbing   Apply ice when throbbing

## 2018-03-31 NOTE — Progress Notes (Addendum)
   Stacy Wolf is status post right carpal tunnel release   S:   Chief Complaint  Patient presents with  . Routine Post Op    Rt CTR 03/13/18   Patient reports that she is having a lot of pain.  She says she is numb   She has no swelling her incision looks clean she is holding her fingers very tightly I told her to start bending him and showed her how to do the exercises return in 4 weeks  I gave her some pain medication to take for 1 week and then she has to take ibuprofen ice and elevate to control swelling  Encounter Diagnosis  Name Primary?  . S/P carpal tunnel release right 03/13/18 Yes    Meds ordered this encounter  Medications  . ibuprofen (ADVIL,MOTRIN) 800 MG tablet    Sig: Take 1 tablet (800 mg total) by mouth every 8 (eight) hours as needed.    Dispense:  90 tablet    Refill:  1  . HYDROcodone-acetaminophen (NORCO/VICODIN) 5-325 MG tablet    Sig: Take 1 tablet by mouth every 4 (four) hours as needed for up to 7 days for moderate pain.    Dispense:  42 tablet    Refill:  0

## 2018-04-07 IMAGING — DX DG ABDOMEN 1V
2 series · 2 of 2 positions shown · non-contrast
Comparison: Lumbar spine radiographs 02/14/2016

CLINICAL DATA: Hematuria, RIGHT flank pain

EXAM:
ABDOMEN - 1 VIEW

[abdomen kub (1 of 2)]
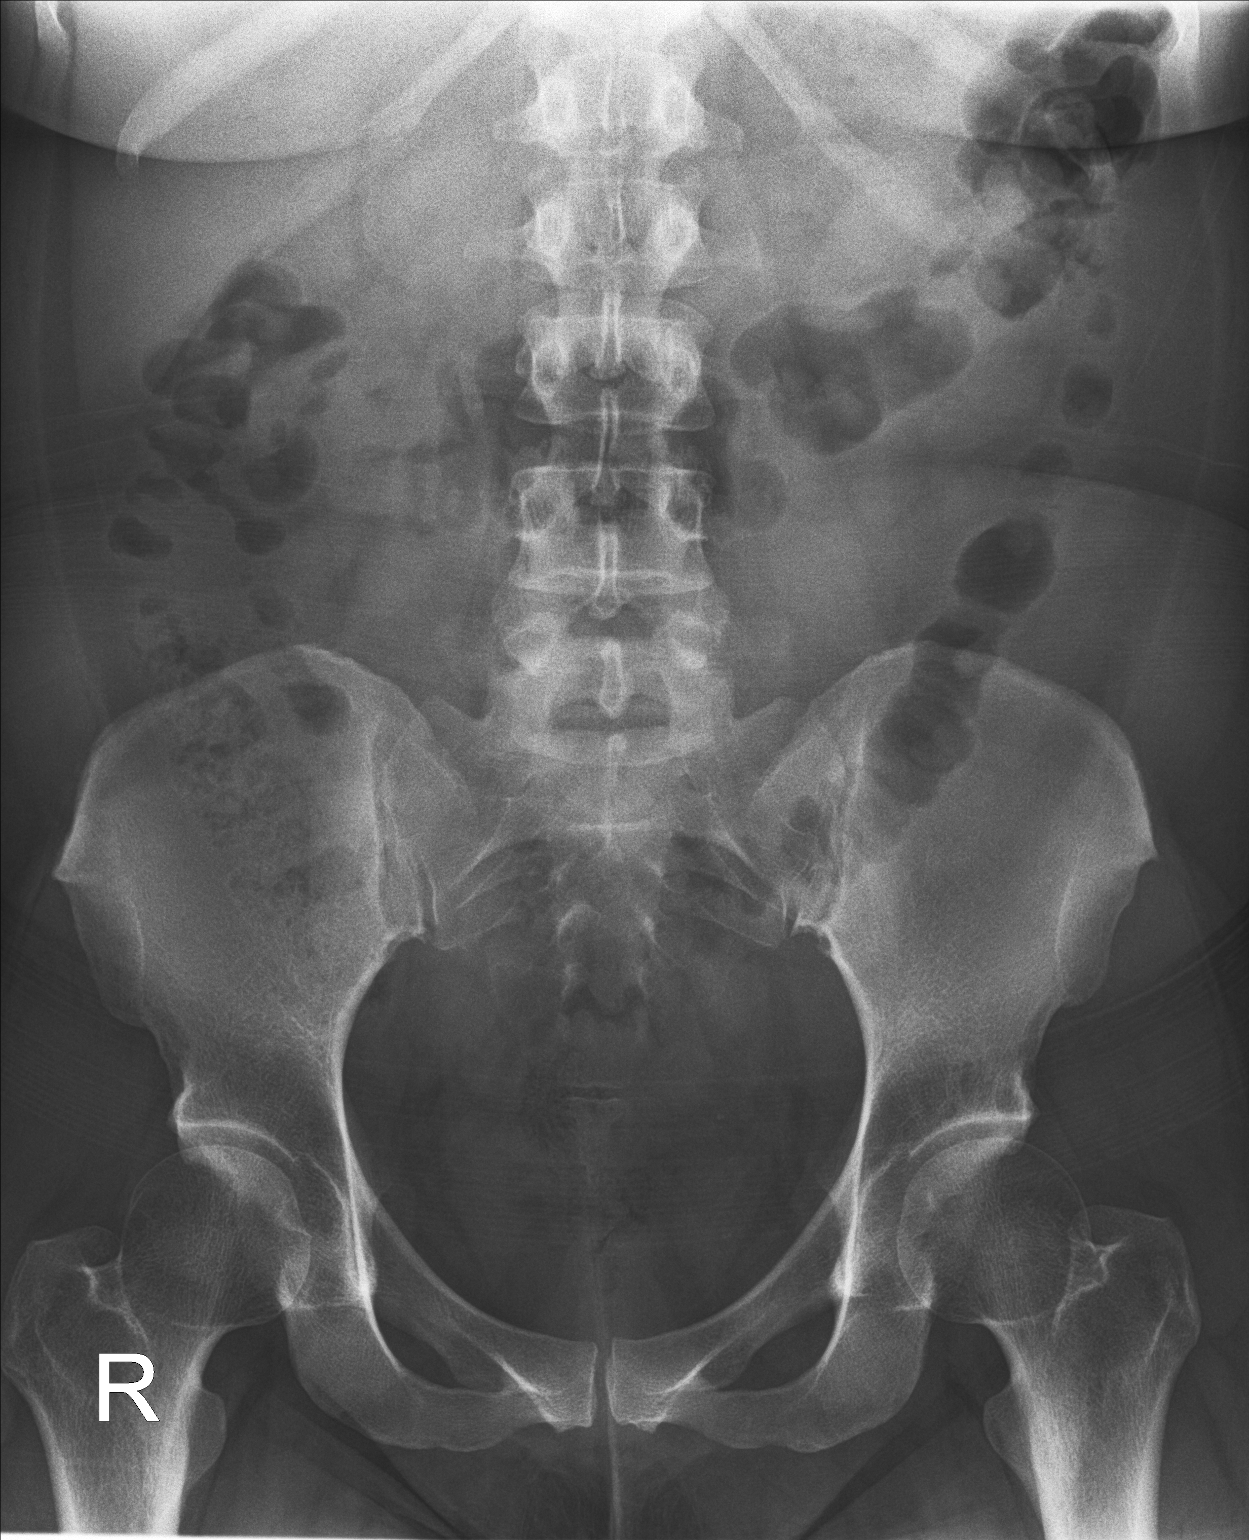

[abdomen kub (2 of 2)]
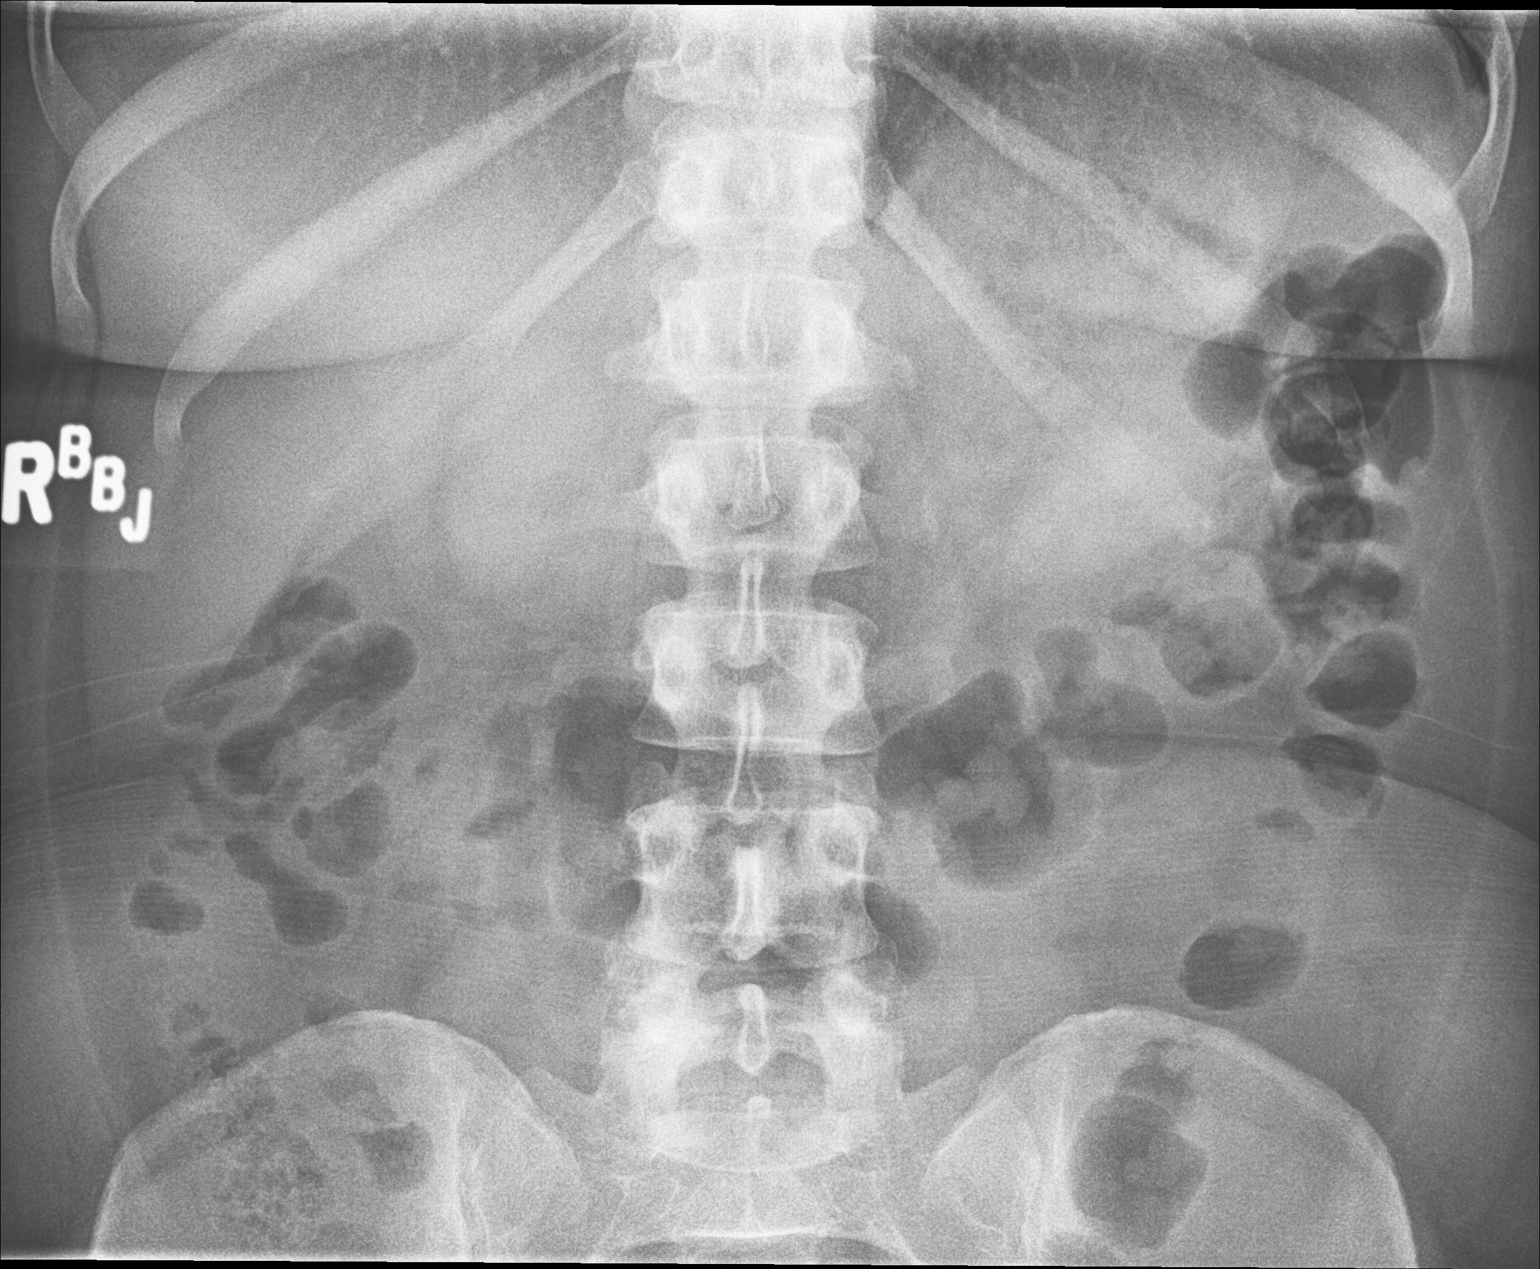

[2 of 2 positions shown; findings below may reference images not displayed]

FINDINGS: No urinary tract calcification.

Single tiny LEFT pelvic phleboliths stable.

Osseous structures unremarkable.

Bowel gas pattern normal.
IMPRESSION: Normal exam.

## 2018-04-21 ENCOUNTER — Ambulatory Visit (INDEPENDENT_AMBULATORY_CARE_PROVIDER_SITE_OTHER): Payer: Medicare Other | Admitting: Orthopedic Surgery

## 2018-04-21 ENCOUNTER — Encounter: Payer: Self-pay | Admitting: Orthopedic Surgery

## 2018-04-21 VITALS — BP 149/96 | HR 78 | Ht 63.0 in | Wt 184.0 lb

## 2018-04-21 DIAGNOSIS — Z9889 Other specified postprocedural states: Secondary | ICD-10-CM

## 2018-04-21 MED ORDER — CEPHALEXIN 500 MG PO CAPS: 500.0000 mg | ORAL_CAPSULE | Freq: Three times a day (TID) | ORAL | 0 refills | Status: DC

## 2018-04-21 NOTE — Progress Notes (Signed)
Chief Complaint  Patient presents with  . Wound Check    Rt CTR 03/13/18 with drainage and odor over 2 weeks   Patient says she has had drainage and odor from the right carpal tunnel incision which was done on January 9.  I do not smell any odor or noticing any drainage  Her wound is sensitive as it was on the other side.  She had to do desensitization with the toothbrushes we will institute that again we will put her on prophylactic antibiotics Keflex 1 3 times daily follow-up in a week  Encounter Diagnosis  Name Primary?  . S/P carpal tunnel release right 03/13/18 Yes

## 2018-04-28 ENCOUNTER — Ambulatory Visit (INDEPENDENT_AMBULATORY_CARE_PROVIDER_SITE_OTHER): Payer: Medicare Other | Admitting: Orthopedic Surgery

## 2018-04-28 ENCOUNTER — Encounter: Payer: Self-pay | Admitting: Orthopedic Surgery

## 2018-04-28 VITALS — BP 124/76 | HR 57 | Ht 63.0 in | Wt 181.0 lb

## 2018-04-28 DIAGNOSIS — M65341 Trigger finger, right ring finger: Secondary | ICD-10-CM

## 2018-04-28 DIAGNOSIS — Z9889 Other specified postprocedural states: Secondary | ICD-10-CM | POA: Diagnosis not present

## 2018-04-28 DIAGNOSIS — M6530 Trigger finger, unspecified finger: Secondary | ICD-10-CM | POA: Insufficient documentation

## 2018-04-28 NOTE — Progress Notes (Signed)
POSTOP VISIT  POD # 40  Chief Complaint  Patient presents with  . Follow-up    Recheck on right CTR, DOS 03-13-18.    43 year old female had a carpal tunnel release on the ninth developed sensitivity over the wound she has been treating that with sensitivity brushes also seem to be developing wound infection was put on Keflex that has improved  She complains of tingling at the index long and ring fingertips  She also complains of triggering of the right ring finger    Physical Exam Vitals signs reviewed.  Constitutional:      Appearance: Normal appearance. She is well-developed.  Musculoskeletal:       Hands:  Neurological:     Mental Status: She is alert and oriented to person, place, and time.  Psychiatric:        Attention and Perception: Attention normal.        Mood and Affect: Mood and affect normal.        Speech: Speech normal.        Behavior: Behavior normal.        Thought Content: Thought content normal.        Judgment: Judgment normal.      Encounter Diagnoses  Name Primary?  . S/P carpal tunnel release right 03/13/18 Yes  . Trigger finger, acquired     Trigger finger injection  Diagnosis  RIGHT RING FINGER TENOSYNOVITIS Procedure injection A1 pulley Medications lidocaine 1% 1 mL and Depo-Medrol 40 mg 1 mL Skin prep alcohol and ethyl chloride Verbal consent was obtained Timeout confirmed the injection site  After cleaning the skin with alcohol and anesthetizing the skin with ethyl chloride the A1 pulley was palpated and the injection was performed without complication  Postoperative plan (Work, WB, No orders of the defined types were placed in this encounter. ,FU)  Fu 1 week

## 2018-05-02 ENCOUNTER — Ambulatory Visit (INDEPENDENT_AMBULATORY_CARE_PROVIDER_SITE_OTHER): Payer: Medicare Other

## 2018-05-02 ENCOUNTER — Ambulatory Visit (INDEPENDENT_AMBULATORY_CARE_PROVIDER_SITE_OTHER): Payer: Medicare Other | Admitting: Obstetrics & Gynecology

## 2018-05-02 ENCOUNTER — Encounter: Payer: Self-pay | Admitting: Obstetrics & Gynecology

## 2018-05-02 ENCOUNTER — Other Ambulatory Visit: Payer: Self-pay

## 2018-05-02 VITALS — BP 131/86 | HR 67 | Ht 63.0 in | Wt 180.0 lb

## 2018-05-02 DIAGNOSIS — N951 Menopausal and female climacteric states: Secondary | ICD-10-CM | POA: Diagnosis not present

## 2018-05-02 DIAGNOSIS — D4959 Neoplasm of unspecified behavior of other genitourinary organ: Secondary | ICD-10-CM

## 2018-05-02 DIAGNOSIS — Z1273 Encounter for screening for malignant neoplasm of ovary: Secondary | ICD-10-CM | POA: Diagnosis not present

## 2018-05-02 DIAGNOSIS — K668 Other specified disorders of peritoneum: Secondary | ICD-10-CM

## 2018-05-02 MED ORDER — ESTRADIOL 2 MG PO TABS: 2.0000 mg | ORAL_TABLET | Freq: Every day | ORAL | 11 refills | Status: DC

## 2018-05-02 NOTE — Progress Notes (Signed)
PELVIC US TA/TV:normal vaginal cuff,normal ovaries bilat,large simple fluid collection right adnexa 12.5 x 7.9 x 8 cm,no free fluid in the RUQ or LUQ

## 2018-05-02 NOTE — Progress Notes (Signed)
Patient ID: Stacy Wolf, female   DOB: 12-16-1975, 43 y.o.   MRN: [id number] Follow up appointment for results  Chief Complaint  Patient presents with  . Follow-up    gyn u/s    Blood pressure 131/86, pulse 67, height 5\' 3"  (1.6 m), weight 180 lb (81.6 kg), last menstrual period 08/10/2016.  US Pelvis Transvanginal Non-ob (tv Only)  Result Date: 05/02/2018 GYNECOLOGIC SONOGRAM Stacy Wolf is a 43 y.o. [phone] s/p hysterectomy,she is here  for a pelvic sonogram to f/u right adnexal fluid collection. Uterus                     Surgically removed,normal vaginal cuff Endometrium          n/a Right ovary             3 x 1.8 x 2.5 cm, wnl,large irregular simple fluid collection right adnexa 12.5 x 7.9 x 8 cm Left ovary                2.5 x 1.4 x 2.6 cm, wnl No pain during ultrasound Technician Comments: PELVIC US TA/TV:normal vaginal cuff,normal ovaries bilat,large irregular simple fluid collection right adnexa 12.5 x 7.9 x 8 cm,? peritoneal inclusion cyst,no free fluid in the RUQ or LUQ Silver Huguenin 05/02/2018 10:36 AM Clinical Impression and recommendations: I have reviewed the sonogram results above, combined with the patient's current clinical course, below are my impressions and any appropriate recommendations for management based on the sonographic findings. peritoneal inclusion cyst is unchanged, no pain on exam Ovaries are normal otherwise (it is noted pt had severe adhesions at the time of her surgery 2 years ago) Florian Buff 05/02/2018 10:41 AM   US Pelvis (transabdominal Only)  Result Date: 05/02/2018 GYNECOLOGIC SONOGRAM Stacy Wolf is a 43 y.o. [phone] s/p hysterectomy,she is here  for a pelvic sonogram to f/u right adnexal fluid collection. Uterus                     Surgically removed,normal vaginal cuff Endometrium          n/a Right ovary             3 x 1.8 x 2.5 cm, wnl,large irregular simple fluid collection right adnexa 12.5 x 7.9 x 8 cm Left ovary                2.5 x 1.4 x  2.6 cm, wnl No pain during ultrasound Technician Comments: PELVIC US TA/TV:normal vaginal cuff,normal ovaries bilat,large irregular simple fluid collection right adnexa 12.5 x 7.9 x 8 cm,? peritoneal inclusion cyst,no free fluid in the RUQ or LUQ Silver Huguenin 05/02/2018 10:36 AM Clinical Impression and recommendations: I have reviewed the sonogram results above, combined with the patient's current clinical course, below are my impressions and any appropriate recommendations for management based on the sonographic findings. peritoneal inclusion cyst is unchanged, no pain on exam Ovaries are normal otherwise (it is noted pt had severe adhesions at the time of her surgery 2 years ago) Florian Buff 05/02/2018 10:41 AM      MEDS ordered this encounter: Meds ordered this encounter  Medications  . estradiol (ESTRACE) 2 MG tablet    Sig: Take 1 tablet (2 mg total) by mouth daily.    Dispense:  30 tablet    Refill:  11    Orders for this encounter: Orders Placed This Encounter  Procedures  . CA 125  Impression: 1. Peritoneal inclusion cyst Menopausal symptoms    Plan: >will check CA 125 to ensure that it is normal but this is an unchanging asymptomatic peritoneal inclusion, will follow up in future if needed or in 1 year >will let her know about the CA 125  Follow Up: Return in about 1 year (around 05/03/2019) for yearly, with Dr Elonda Husky.       Face to face time:  15 minutes  Greater than 50% of the visit time was spent in counseling and coordination of care with the patient.  The summary and outline of the counseling and care coordination is summarized in the note above.   All questions were answered.  Past Medical History:  Diagnosis Date  . Anxiety   . Bipolar 1 disorder ([place])   . Heart murmur   . Migraines   . Schizophrenia [facility])     Past Surgical History:  Procedure Laterality Date  . arm surgery Right    tendonitis-wrist  . BILATERAL SALPINGECTOMY Bilateral  08/29/2016   Procedure: BILATERAL SALPINGECTOMY;  Surgeon: Florian Buff, MD;  Location: [facility];  Service: Gynecology;  Laterality: Bilateral;  . CARPAL TUNNEL RELEASE Left 04/11/2016   Procedure: CARPAL TUNNEL RELEASE;  Surgeon: [name], MD;  Location: [facility];  Service: Orthopedics;  Laterality: Left;  . CARPAL TUNNEL RELEASE Right 03/13/2018   Procedure: RIGHT CARPAL TUNNEL RELEASE;  Surgeon: [name], MD;  Location: [facility];  Service: Orthopedics;  Laterality: Right;  . SUPRACERVICAL ABDOMINAL HYSTERECTOMY N/A 08/29/2016   Procedure: HYSTERECTOMY SUPRACERVICAL ABDOMINAL;  Surgeon: Florian Buff, MD;  Location: [facility];  Service: Gynecology;  Laterality: N/A;  . TUBAL LIGATION      OB History    Gravida  3   Para  3   Term  3   Preterm      AB      Living  3     SAB      TAB      Ectopic      Multiple      Live Births  3           Allergies  Allergen Reactions  . Imitrex [Sumatriptan] Anaphylaxis and Other (See Comments)    Could not swallow after taking med  . Latuda [Lurasidone Hcl] Other (See Comments)    Suicidal thoughts   . Wellbutrin [Bupropion] Other (See Comments)    Seizures / lowered seizure threshold  . Zoloft [Sertraline Hcl] Hives  . Haloperidol And Related Other (See Comments)    Leg cramps  . Nicoderm [Nicotine] Dermatitis    Social History   Socioeconomic History  . Marital status: Divorced    Spouse name: Not on file  . Number of children: 3  . Years of education: Not on file  . Highest education level: 11th grade  Occupational History  . Not on file  Social Needs  . Financial resource strain: Not hard at all  . Food insecurity:    Worry: Never true    Inability: Never true  . Transportation needs:    Medical: No    Non-medical: No  Tobacco Use  . Smoking status: Former Smoker    Packs/day: 0.50    Years: 7.00    Pack years: 3.50    Types: Cigarettes    Last attempt to quit: 11/05/2017    Years since  quitting: 0.4  . Smokeless tobacco: Never Used  Substance and Sexual Activity  . Alcohol use:  Yes  . Drug use: Yes    Types: Marijuana    Comment: once a week  . Sexual activity: Yes    Birth control/protection: Surgical    Comment: hyst  Lifestyle  . Physical activity:    Days per week: 0 days    Minutes per session: 0 min  . Stress: Not at all  Relationships  . Social connections:    Talks on phone: More than three times a week    Gets together: Three times a week    Attends religious service: Never    Active member of club or organization: No    Attends meetings of clubs or organizations: Never    Relationship status: Divorced  Other Topics Concern  . Not on file  Social History Narrative  . Not on file    Family History  Problem Relation Age of Onset  . Cancer Mother 26       breast CA at 54 and uterine CA at 58  . Hypertension Mother   . Kidney disease Mother        kidney transplant  . Cancer Sister 66       uterine  . Cancer Sister 26       uterine  . Diabetes Father   . Liver disease Father   . Cancer Maternal Aunt        breast

## 2018-05-03 LAB — CA 125: Cancer Antigen (CA) 125: 10.9 U/mL (ref 0.0–38.1)

## 2018-05-05 ENCOUNTER — Ambulatory Visit (INDEPENDENT_AMBULATORY_CARE_PROVIDER_SITE_OTHER): Payer: Medicare Other | Admitting: Orthopedic Surgery

## 2018-05-05 ENCOUNTER — Encounter: Payer: Self-pay | Admitting: Orthopedic Surgery

## 2018-05-05 VITALS — BP 129/89 | HR 67 | Ht 63.0 in | Wt 181.0 lb

## 2018-05-05 DIAGNOSIS — Z9889 Other specified postprocedural states: Secondary | ICD-10-CM

## 2018-05-05 DIAGNOSIS — M6530 Trigger finger, unspecified finger: Secondary | ICD-10-CM

## 2018-05-05 NOTE — Progress Notes (Signed)
Chief Complaint  Patient presents with  . Post-op Follow-up    03/13/2018 right carpal tunnel release     Encounter Diagnoses  Name Primary?  . S/P carpal tunnel release right 03/13/18 Yes  . Trigger finger, acquired RIGHT RING FINGER      Right long fingertip has some numbness residual.  Tenderness and soreness over the incision.  She took Keflex has a few tablets left the infection seems to have resolved  Full range of motion she is no longer triggering she has some soreness at the A1 pulley  Follow-up in 2 weeks  She should continue scar desensitization and active range of motion

## 2018-05-19 ENCOUNTER — Encounter: Payer: Self-pay | Admitting: Orthopedic Surgery

## 2018-05-19 ENCOUNTER — Other Ambulatory Visit: Payer: Self-pay

## 2018-05-19 ENCOUNTER — Ambulatory Visit (INDEPENDENT_AMBULATORY_CARE_PROVIDER_SITE_OTHER): Payer: Medicare Other

## 2018-05-19 ENCOUNTER — Ambulatory Visit (INDEPENDENT_AMBULATORY_CARE_PROVIDER_SITE_OTHER): Payer: Medicare Other | Admitting: Orthopedic Surgery

## 2018-05-19 VITALS — BP 120/61 | HR 72 | Ht 63.0 in | Wt 179.0 lb

## 2018-05-19 DIAGNOSIS — T8149XA Infection following a procedure, other surgical site, initial encounter: Secondary | ICD-10-CM

## 2018-05-19 DIAGNOSIS — M79641 Pain in right hand: Secondary | ICD-10-CM

## 2018-05-19 DIAGNOSIS — Z9889 Other specified postprocedural states: Secondary | ICD-10-CM

## 2018-05-19 MED ORDER — CEPHALEXIN 500 MG PO CAPS: 500.0000 mg | ORAL_CAPSULE | Freq: Three times a day (TID) | ORAL | 0 refills | Status: DC

## 2018-05-19 MED ORDER — HYDROCODONE-ACETAMINOPHEN 5-325 MG PO TABS: 1.0000 | ORAL_TABLET | Freq: Four times a day (QID) | ORAL | 0 refills | Status: DC | PRN

## 2018-05-19 NOTE — Addendum Note (Signed)
Addended byCandice Camp on: 05/19/2018 11:07 AM   Modules accepted: Orders

## 2018-05-19 NOTE — Patient Instructions (Signed)
If oow continue x 2 weeks

## 2018-05-19 NOTE — Progress Notes (Signed)
Chief Complaint  Patient presents with  . Follow-up    Recheck on right CTR, DOS 03-13-18.    Stacy Wolf comes in for recheck on her right wrist she reports increased pain swelling near the incision just to the ulnar side.  Her symptoms of numbness and tingling seem to be okay but her pain is increasing her wrist is swelling having trouble sleeping at night.  Oral antibiotics have finished she was able to take ibuprofen but is now having to take hydrocodone for pain relief  Review of Systems  Constitutional: Negative for chills, fever and malaise/fatigue.  Neurological: Negative for tingling, sensory change, focal weakness and weakness.   BP 120/61   Pulse 72   Ht 5\' 3"  (1.6 m)   Wt 179 lb (81.2 kg)   LMP 08/10/2016   BMI 31.71 kg/m  Physical Exam Vitals signs and nursing note reviewed.  Constitutional:      Appearance: Normal appearance.  Musculoskeletal:       Arms:  Neurological:     Mental Status: She is alert and oriented to person, place, and time.  Psychiatric:        Mood and Affect: Mood normal.     Image right wrist: normal xrays   Encounter Diagnoses  Name Primary?  . Pain of right hand   . S/P carpal tunnel release right 03/13/18   . Postoperative wound infection Yes   Resume antibiotics send patient for laboratory studies CBC and sed rate and C-reactive protein Refill [facility] Assess work status

## 2018-05-20 LAB — CBC WITH DIFFERENTIAL/PLATELET
Absolute Monocytes: 343 cells/uL (ref 200–950)
Basophils Absolute: 51 cells/uL (ref 0–200)
Basophils Relative: 0.7 %
Eosinophils Absolute: 161 cells/uL (ref 15–500)
Eosinophils Relative: 2.2 %
HCT: 38.7 % (ref 35.0–45.0)
Hemoglobin: 13.1 g/dL (ref 11.7–15.5)
Lymphs Abs: 3183 cells/uL (ref 850–3900)
MCH: 31.3 pg (ref 27.0–33.0)
MCHC: 33.9 g/dL (ref 32.0–36.0)
MCV: 92.4 fL (ref 80.0–100.0)
MPV: 10.3 fL (ref 7.5–12.5)
Monocytes Relative: 4.7 %
NEUTROS ABS: 3562 {cells}/uL (ref 1500–7800)
Neutrophils Relative %: 48.8 %
Platelets: 247 10*3/uL (ref 140–400)
RBC: 4.19 10*6/uL (ref 3.80–5.10)
RDW: 13.1 % (ref 11.0–15.0)
Total Lymphocyte: 43.6 %
WBC: 7.3 10*3/uL (ref 3.8–10.8)

## 2018-05-20 LAB — C-REACTIVE PROTEIN: CRP: 4.2 mg/L (ref <8.0)

## 2018-05-20 LAB — SEDIMENTATION RATE: Sed Rate: 19 mm/h (ref 0–20)

## 2018-05-26 ENCOUNTER — Other Ambulatory Visit: Payer: Self-pay | Admitting: Obstetrics & Gynecology

## 2018-06-02 ENCOUNTER — Ambulatory Visit (INDEPENDENT_AMBULATORY_CARE_PROVIDER_SITE_OTHER): Payer: Medicare Other | Admitting: Orthopedic Surgery

## 2018-06-02 ENCOUNTER — Encounter: Payer: Self-pay | Admitting: Orthopedic Surgery

## 2018-06-02 ENCOUNTER — Other Ambulatory Visit: Payer: Self-pay

## 2018-06-02 VITALS — BP 141/92 | HR 64 | Temp 96.7°F | Ht 63.0 in | Wt 180.0 lb

## 2018-06-02 DIAGNOSIS — Z9889 Other specified postprocedural states: Secondary | ICD-10-CM

## 2018-06-02 NOTE — Patient Instructions (Signed)
Desensitization with hard toothbrush  Continue Keflex  Continue hydrocodone for pain

## 2018-06-02 NOTE — Progress Notes (Signed)
POSTOP VISIT  POD # 81, carpal tunnel release right hand March 13, 2018  Chief Complaint  Patient presents with  . Results   Complains of cramping 1 time last Thursday and thumb index long and ring fingertips still numb   66F s/p bilateral CTR, right wound problem, sensitive scar  On Keflex: 2nd round  Labs ordered for wound infection   Results for LADY, WISHAM (MRN [id number]) as of 06/02/2018 08:33  05/19/2018 11:26 CRP: 4.2 WBC: 7.3 Neutrophils: 48.8 NEUT#: 3,562 Sed Rate: 19   BP (!) 141/92   Pulse 64   Temp (!) 96.7 F (35.9 C)   Ht 5\' 3"  (1.6 m)   Wt 180 lb (81.6 kg)   LMP 08/10/2016   BMI 31.89 kg/m   Physical Exam Musculoskeletal:       Arms:    Encounter Diagnosis  Name Primary?  . S/P carpal tunnel release right 03/13/18 Yes    Virtual visit in a week    Postoperative plan (Work, WB, No orders of the defined types were placed in this encounter. ,FU)   Desensitization with hard toothbrush  Continue Keflex  Continue hydrocodone for pain

## 2018-06-09 ENCOUNTER — Ambulatory Visit (INDEPENDENT_AMBULATORY_CARE_PROVIDER_SITE_OTHER): Payer: Medicare Other | Admitting: Orthopedic Surgery

## 2018-06-09 ENCOUNTER — Other Ambulatory Visit: Payer: Self-pay

## 2018-06-09 ENCOUNTER — Encounter: Payer: Self-pay | Admitting: Orthopedic Surgery

## 2018-06-09 DIAGNOSIS — Z9889 Other specified postprocedural states: Secondary | ICD-10-CM

## 2018-06-09 DIAGNOSIS — M79641 Pain in right hand: Secondary | ICD-10-CM

## 2018-06-09 MED ORDER — CEPHALEXIN 500 MG PO CAPS: 500.0000 mg | ORAL_CAPSULE | Freq: Three times a day (TID) | ORAL | 0 refills | Status: DC

## 2018-06-09 NOTE — Progress Notes (Signed)
Virtual Visit via Telephone Note  I connected with Stacy Wolf on 06/09/18 at  3:00 PM EDT by telephone and verified that I am speaking with the correct person using two identifiers  Chief complaint pain incision right carpal tunnel  43 year old female had a right carpal tunnel has some extensive tenderness in the palm over the incision she has been worked up for infection and placed on prophylactic Keflex which she is on  She is on hydrocodone for pain but now she is down to once at night along with her desensitization therapies  Recommend 3-week virtual visit follow-up  Meds ordered this encounter  Medications  . cephALEXin (KEFLEX) 500 MG capsule    Sig: Take 1 capsule (500 mg total) by mouth 3 (three) times daily.    Dispense:  42 capsule    Refill:  0    Encounter Diagnoses  Name Primary?  . Pain of right hand   . S/P carpal tunnel release right 03/13/18 Yes     I discussed the limitations, risks, security and privacy concerns of performing an evaluation and management service by telephone and the availability of in person appointments. I also discussed with the patient that there may be a patient responsible charge related to this service. The patient expressed understanding and agreed to proceed.     I discussed the assessment and treatment plan with the patient. The patient was provided an opportunity to ask questions and all were answered. The patient agreed with the plan and demonstrated an understanding of the instructions.   The patient was advised to call back or seek an in-person evaluation if the symptoms worsen or if the condition fails to improve as anticipated.  I provided <5 minutes of non-face-to-face time during this encounter.   Arther Abbott, MD

## 2018-06-20 ENCOUNTER — Other Ambulatory Visit: Payer: Self-pay | Admitting: Orthopedic Surgery

## 2018-06-20 DIAGNOSIS — M79641 Pain in right hand: Secondary | ICD-10-CM

## 2018-06-20 DIAGNOSIS — T8149XA Infection following a procedure, other surgical site, initial encounter: Secondary | ICD-10-CM

## 2018-06-20 DIAGNOSIS — Z9889 Other specified postprocedural states: Secondary | ICD-10-CM

## 2018-06-20 NOTE — Telephone Encounter (Signed)
Hydrocodone-Acetaminophen  5/325 mg  Qty 30 Tablets  Take 1 tablet by mouth every 6(six) hours as needed for moderate pain.  PATIENT USES [facility]

## 2018-06-24 MED ORDER — HYDROCODONE-ACETAMINOPHEN 5-325 MG PO TABS: 1.0000 | ORAL_TABLET | Freq: Three times a day (TID) | ORAL | 0 refills | Status: AC | PRN

## 2018-06-24 NOTE — Telephone Encounter (Signed)
CTR was January

## 2018-06-30 ENCOUNTER — Other Ambulatory Visit: Payer: Self-pay

## 2018-06-30 ENCOUNTER — Ambulatory Visit (INDEPENDENT_AMBULATORY_CARE_PROVIDER_SITE_OTHER): Payer: Medicare Other | Admitting: Orthopedic Surgery

## 2018-06-30 DIAGNOSIS — Z9889 Other specified postprocedural states: Secondary | ICD-10-CM

## 2018-06-30 NOTE — Progress Notes (Signed)
Virtual Visit via Telephone Note  I connected with Stacy Wolf on 06/30/18 at  1:50 PM EDT by telephone and verified that I am speaking with the correct person using two identifiers.   I discussed the limitations, risks, security and privacy concerns of performing an evaluation and management service by telephone and the availability of in person appointments. I also discussed with the patient that there may be a patient responsible charge related to this service. The patient expressed understanding and agreed to proceed.  Chief complaint follow-up visit status post carpal tunnel release on the right March 13, 7562  Complicated by painful incision swelling.  We did rule out infection with normal lab values but kept her on prophylactic antibiotics.  She did require some hydrocodone again when she hit her hand while cleaning up.  She says things are getting much better she still has some swelling but her pain is improved  We recommend that she continue with her antibiotics until they are finished.  She can finish out her hydrocodone which was a 1 week prescription starting on April 21  Virtual visit follow-up in 3 weeks  [id number]   I discussed the assessment and treatment plan with the patient. The patient was provided an opportunity to ask questions and all were answered. The patient agreed with the plan and demonstrated an understanding of the instructions.   The patient was advised to call back or seek an in-person evaluation if the symptoms worsen or if the condition fails to improve as anticipated.  I provided 3 minutes of non-face-to-face time during this encounter.   Arther Abbott, MD

## 2018-07-21 ENCOUNTER — Ambulatory Visit (INDEPENDENT_AMBULATORY_CARE_PROVIDER_SITE_OTHER): Payer: Medicare Other | Admitting: Orthopedic Surgery

## 2018-07-21 ENCOUNTER — Other Ambulatory Visit: Payer: Self-pay

## 2018-07-21 DIAGNOSIS — Z9889 Other specified postprocedural states: Secondary | ICD-10-CM | POA: Diagnosis not present

## 2018-07-21 DIAGNOSIS — T8149XA Infection following a procedure, other surgical site, initial encounter: Secondary | ICD-10-CM

## 2018-07-21 NOTE — Progress Notes (Signed)
Virtual Visit via Telephone Note  I connected with Stacy Wolf on 07/21/18 at  3:40 PM EDT by telephone and verified that I am speaking with the correct person using two identifiers.  Location: Patient: home Provider: office   I discussed the limitations, risks, security and privacy concerns of performing an evaluation and management service by telephone and the availability of in person appointments. I also discussed with the patient that there may be a patient responsible charge related to this service. The patient expressed understanding and agreed to proceed.     I discussed the assessment and treatment plan with the patient. The patient was provided an opportunity to ask questions and all were answered. The patient agreed with the plan and demonstrated an understanding of the instructions.   The patient was advised to call back or seek an in-person evaluation if the symptoms worsen or if the condition fails to improve as anticipated.  I provided 62min 23 sec   minutes of non-face-to-face time during this encounter.  Chief complaint recheck wrist, status post carpal tunnel right wrist March 13, 2018  Postop course complicated by painful incision with swelling around the wrist treated with antibiotics and oral hydrocodone.  She says she is doing great she is having no pain she is moving her wrist normally he has no numbness or tingling in her hand  Patient is advised to complete a course of antibiotics and then stop them if she has any recurrence of symptoms she is to call the office  Diagnosis status post carpal tunnel release right wrist Postop wound infection Postop pain   Arther Abbott, MD

## 2018-08-12 ENCOUNTER — Encounter: Payer: Self-pay | Admitting: Physician Assistant

## 2018-08-12 ENCOUNTER — Ambulatory Visit (INDEPENDENT_AMBULATORY_CARE_PROVIDER_SITE_OTHER): Payer: Medicare Other | Admitting: Physician Assistant

## 2018-08-12 ENCOUNTER — Other Ambulatory Visit: Payer: Self-pay

## 2018-08-12 VITALS — BP 124/79 | HR 70 | Temp 98.6°F | Ht 63.0 in | Wt 180.2 lb

## 2018-08-12 DIAGNOSIS — N3000 Acute cystitis without hematuria: Secondary | ICD-10-CM | POA: Diagnosis not present

## 2018-08-12 DIAGNOSIS — R300 Dysuria: Secondary | ICD-10-CM

## 2018-08-12 LAB — URINALYSIS, COMPLETE
Bilirubin, UA: NEGATIVE
Glucose, UA: NEGATIVE
Ketones, UA: NEGATIVE
Leukocytes,UA: NEGATIVE
Nitrite, UA: NEGATIVE
Specific Gravity, UA: >1.03 — ABNORMAL HIGH (ref 1.005–1.030)
Urobilinogen, Ur: 0.2 mg/dL (ref 0.2–1.0)
pH, UA: 5.5 (ref 5.0–7.5)

## 2018-08-12 LAB — MICROSCOPIC EXAMINATION
Epithelial Cells (non renal): >10 /hpf — AB (ref 0–10)
Renal Epithel, UA: NONE SEEN /hpf

## 2018-08-12 MED ORDER — FLUCONAZOLE 150 MG PO TABS: 150.0000 mg | ORAL_TABLET | Freq: Once | ORAL | 0 refills | Status: AC

## 2018-08-12 MED ORDER — CEPHALEXIN 500 MG PO CAPS: 500.0000 mg | ORAL_CAPSULE | Freq: Four times a day (QID) | ORAL | 0 refills | Status: DC

## 2018-08-12 NOTE — Progress Notes (Signed)
BP 124/79   Pulse 70   Temp 98.6 F (37 C) (Oral)   Ht 5\' 3"  (1.6 m)   Wt 180 lb 3.2 oz (81.7 kg)   LMP 08/10/2016   BMI 31.92 kg/m    Subjective:    Patient ID: Stacy Wolf, female    DOB: 1975/12/16, 43 y.o.   MRN: [id number]  HPI: Stacy Wolf is a 43 y.o. female presenting on 08/12/2018 for Back Pain (low ) and Urinary Tract Infection  This patient has had several days of dysuria, frequency and nocturia. There is also pain over the bladder in the suprapubic region, no back pain. Denies leakage or hematuria.  Denies fever or chills. No pain in flank area.   Past Medical History:  Diagnosis Date  . Anxiety   . Bipolar 1 disorder ([place])   . Heart murmur   . Migraines   . Schizophrenia ([place])    Relevant past medical, surgical, family and social history reviewed and updated as indicated. Interim medical history since our last visit reviewed. Allergies and medications reviewed and updated. DATA REVIEWED: CHART IN EPIC  Family History reviewed for pertinent findings.  Review of Systems  Constitutional: Negative.   HENT: Negative.   Eyes: Negative.   Respiratory: Negative.   Gastrointestinal: Negative.   Genitourinary: Positive for difficulty urinating, dysuria and urgency. Negative for flank pain.    Allergies as of 08/12/2018      Reactions   Imitrex [sumatriptan] Anaphylaxis, Other (See Comments)   Could not swallow after taking med   Latuda [lurasidone Hcl] Other (See Comments)   Suicidal thoughts   Wellbutrin [bupropion] Other (See Comments)   Seizures / lowered seizure threshold   Zoloft [sertraline Hcl] Hives   Haloperidol And Related Other (See Comments)   Leg cramps   Nicoderm [nicotine] Dermatitis      Medication List       Accurate as of August 12, 2018 12:46 PM. If you have any questions, ask your nurse or doctor.        aspirin-acetaminophen-caffeine 250-250-65 MG tablet Commonly known as:  EXCEDRIN MIGRAINE Take 2 tablets by mouth every 6  (six) hours as needed for headache or migraine.   cephALEXin 500 MG capsule Commonly known as:  KEFLEX Take 1 capsule (500 mg total) by mouth 4 (four) times daily. What changed:  when to take this Changed by:  Terald Sleeper, PA-C   estradiol 2 MG tablet Commonly known as:  ESTRACE TAKE 1 TABLET BY MOUTH EVERY DAY   fluconazole 150 MG tablet Commonly known as:  Diflucan Take 1 tablet (150 mg total) by mouth once for 1 dose. Started by:  Terald Sleeper, PA-C   ibuprofen 800 MG tablet Commonly known as:  ADVIL Take 1 tablet (800 mg total) by mouth every 8 (eight) hours as needed.   topiramate 50 MG tablet Commonly known as:  TOPAMAX Take 1 tablet (50 mg total) by mouth at bedtime.          Objective:    BP 124/79   Pulse 70   Temp 98.6 F (37 C) (Oral)   Ht 5\' 3"  (1.6 m)   Wt 180 lb 3.2 oz (81.7 kg)   LMP 08/10/2016   BMI 31.92 kg/m   Allergies  Allergen Reactions  . Imitrex [Sumatriptan] Anaphylaxis and Other (See Comments)    Could not swallow after taking med  . Latuda [Lurasidone Hcl] Other (See Comments)    Suicidal thoughts   .  Wellbutrin [Bupropion] Other (See Comments)    Seizures / lowered seizure threshold  . Zoloft [Sertraline Hcl] Hives  . Haloperidol And Related Other (See Comments)    Leg cramps  . Nicoderm [Nicotine] Dermatitis    Wt Readings from Last 3 Encounters:  08/12/18 180 lb 3.2 oz (81.7 kg)  06/02/18 180 lb (81.6 kg)  05/19/18 179 lb (81.2 kg)    Physical Exam Constitutional:      Appearance: She is well-developed.  HENT:     Head: Normocephalic and atraumatic.  Eyes:     Conjunctiva/sclera: Conjunctivae normal.     Pupils: Pupils are equal, round, and reactive to light.  Cardiovascular:     Rate and Rhythm: Normal rate and regular rhythm.     Heart sounds: Normal heart sounds.  Pulmonary:     Effort: Pulmonary effort is normal.     Breath sounds: Normal breath sounds.  Abdominal:     General: Bowel sounds are normal.  There is no distension.     Palpations: Abdomen is soft. There is no mass.     Tenderness: There is abdominal tenderness in the suprapubic area. There is no guarding or rebound.  Skin:    General: Skin is warm and dry.     Findings: No rash.  Neurological:     Mental Status: She is alert and oriented to person, place, and time.     Deep Tendon Reflexes: Reflexes are normal and symmetric.  Psychiatric:        Behavior: Behavior normal.        Thought Content: Thought content normal.        Judgment: Judgment normal.         Assessment & Plan:   1. Dysuria - Urine Culture - Urinalysis, Complete  2. Acute cystitis without hematuria - cephALEXin (KEFLEX) 500 MG capsule; Take 1 capsule (500 mg total) by mouth 4 (four) times daily.  Dispense: 30 capsule; Refill: 0 - fluconazole (DIFLUCAN) 150 MG tablet; Take 1 tablet (150 mg total) by mouth once for 1 dose.  Dispense: 1 tablet; Refill: 0   Continue all other maintenance medications as listed above.  Follow up plan: No follow-ups on file.  Educational handout given for Men[place] PA-C [place] [address]  [place], [place] [address] [phone]   08/12/2018, 12:46 PM

## 2018-08-14 LAB — URINE CULTURE

## 2018-08-28 ENCOUNTER — Telehealth: Payer: Self-pay | Admitting: Family Medicine

## 2018-09-09 ENCOUNTER — Other Ambulatory Visit: Payer: Self-pay

## 2018-09-09 ENCOUNTER — Encounter: Payer: Medicare Other | Admitting: *Deleted

## 2018-11-12 ENCOUNTER — Encounter: Payer: Self-pay | Admitting: Orthopedic Surgery

## 2018-11-12 ENCOUNTER — Ambulatory Visit (INDEPENDENT_AMBULATORY_CARE_PROVIDER_SITE_OTHER): Payer: Medicare Other | Admitting: Orthopedic Surgery

## 2018-11-12 ENCOUNTER — Other Ambulatory Visit: Payer: Self-pay

## 2018-11-12 VITALS — BP 123/79 | HR 75 | Ht 63.0 in | Wt 175.0 lb

## 2018-11-12 DIAGNOSIS — M65341 Trigger finger, right ring finger: Secondary | ICD-10-CM | POA: Diagnosis not present

## 2018-11-12 NOTE — Progress Notes (Signed)
Chief Complaint  Patient presents with  . Hand Problem    fingers catch and are painful    S/P CTR  RIGHT JAN 2020  C/O severe dull sometimes sharp nonradiating pain right ring finger with catching locking tenderness over the A1 pulley no relief except with opioid medication  Review of Systems  All other systems reviewed and are negative.  Past Medical History:  Diagnosis Date  . Anxiety   . Bipolar 1 disorder ([place])   . Heart murmur   . Migraines   . Schizophrenia [facility])    Past Surgical History:  Procedure Laterality Date  . arm surgery Right    tendonitis-wrist  . BILATERAL SALPINGECTOMY Bilateral 08/29/2016   Procedure: BILATERAL SALPINGECTOMY;  Surgeon: Florian Buff, MD;  Location: [facility];  Service: Gynecology;  Laterality: Bilateral;  . CARPAL TUNNEL RELEASE Left 04/11/2016   Procedure: CARPAL TUNNEL RELEASE;  Surgeon: [name], MD;  Location: [facility];  Service: Orthopedics;  Laterality: Left;  . CARPAL TUNNEL RELEASE Right 03/13/2018   Procedure: RIGHT CARPAL TUNNEL RELEASE;  Surgeon: [name], MD;  Location: [facility];  Service: Orthopedics;  Laterality: Right;  . SUPRACERVICAL ABDOMINAL HYSTERECTOMY N/A 08/29/2016   Procedure: HYSTERECTOMY SUPRACERVICAL ABDOMINAL;  Surgeon: Florian Buff, MD;  Location: [facility];  Service: Gynecology;  Laterality: N/A;  . TUBAL LIGATION     Social History   Tobacco Use  . Smoking status: Former Smoker    Packs/day: 0.50    Years: 7.00    Pack years: 3.50    Types: Cigarettes    Quit date: 11/05/2017    Years since quitting: 1.0  . Smokeless tobacco: Never Used  Substance Use Topics  . Alcohol use: Yes  . Drug use: Yes    Types: Marijuana    Comment: once a week   BP 123/79   Pulse 75   Ht 5\' 3"  (1.6 m)   Wt 175 lb (79.4 kg)   LMP 08/10/2016   BMI 31.00 kg/m   General appearance normal well-groomed  She is oriented to person place and time normally  Her mood is pleasant her affect is normal  She walks  normally without any gait disturbance  Then in the right hand we see normal alignment in the hand with tenderness over the A1 pulley catching on flexion extension of the right ring finger with tenderness there otherwise stable normal grip strength skin is normal pulse and perfusion are normal lymph nodes are normal in the right arm she has no sensory deficit she has a scar over the carpal tunnel Deep tendon reflexes are normal no pathologic reflexes coordination and balance are normal  Encounter Diagnosis  Name Primary?  . Trigger finger, right ring finger Yes    Options surgery versus injection she opted for surgery on the right ring finger for   right ring finger trigger finger release  The procedure has been fully reviewed with the patient; The risks and benefits of surgery have been discussed and explained and understood. Alternative treatment has also been reviewed, questions were encouraged and answered. The postoperative plan is also been reviewed.

## 2018-11-12 NOTE — Patient Instructions (Signed)
Trigger Finger  Trigger finger (stenosing tenosynovitis) is a condition that causes a finger to get stuck in a bent position. Each finger has a tough, cord-like tissue that connects muscle to bone (tendon), and each tendon is surrounded by a tunnel of tissue (tendon sheath). To move your finger, your tendon needs to slide freely through the sheath. Trigger finger happens when the tendon or the sheath thickens, making it difficult to move your finger. Trigger finger can affect any finger or a thumb. It may affect more than one finger. Mild cases may clear up with rest and medicine. Severe cases require more treatment. What are the causes? Trigger finger is caused by a thickened finger tendon or tendon sheath. The cause of this thickening is not known. What increases the risk? The following factors may make you more likely to develop this condition:  Doing activities that require a strong grip.  Having rheumatoid arthritis, gout, or diabetes.  Being 40-60 years old.  Being a woman. What are the signs or symptoms? Symptoms of this condition include:  Pain when bending or straightening your finger.  Tenderness or swelling where your finger attaches to the palm of your hand.  A lump in the palm of your hand or on the inside of your finger.  Hearing a popping sound when you try to straighten your finger.  Feeling a popping, catching, or locking sensation when you try to straighten your finger.  Being unable to straighten your finger. How is this diagnosed? This condition is diagnosed based on your symptoms and a physical exam. How is this treated? This condition may be treated by:  Resting your finger and avoiding activities that make symptoms worse.  Wearing a finger splint to keep your finger in a slightly bent position.  Taking NSAIDs to relieve pain and swelling.  Injecting medicine (steroids) into the tendon sheath to reduce swelling and irritation. Injections may need to be  repeated.  Having surgery to open the tendon sheath. This may be done if other treatments do not work and you cannot straighten your finger. You may need physical therapy after surgery. Follow these instructions at home:   Use moist heat to help reduce pain and swelling as told by your health care provider.  Rest your finger and avoid activities that make pain worse. Return to normal activities as told by your health care provider.  If you have a splint, wear it as told by your health care provider.  Take over-the-counter and prescription medicines only as told by your health care provider.  Keep all follow-up visits as told by your health care provider. This is important. Contact a health care provider if:  Your symptoms are not improving with home care. Summary  Trigger finger (stenosing tenosynovitis) causes your finger to get stuck in a bent position, and it can make it difficult and painful to straighten your finger.  This condition develops when a finger tendon or tendon sheath thickens.  Treatment starts with resting, wearing a splint, and taking NSAIDs.  In severe cases, surgery to open the tendon sheath may be needed. This information is not intended to replace advice given to you by your health care provider. Make sure you discuss any questions you have with your health care provider. Document Released: 12/10/2003 Document Revised: 02/01/2017 Document Reviewed: 01/31/2016 Elsevier Patient Education  2020 [employer]  

## 2018-11-13 NOTE — Patient Instructions (Signed)
Your procedure is scheduled on: 11/18/2018  Report to [facility] at  6:15   AM.  Call this number if you have problems the morning of surgery: [phone]   Remember:   Do not Eat or Drink after midnight   :     Do not wear jewelry, make-up or nail polish.  Do not wear lotions, powders, or perfumes. You may wear deodorant.  Do not shave 48 hours prior to surgery. Men may shave face and neck.  Do not bring valuables to the hospital.  Contacts, dentures or bridgework may not be worn into surgery.  Leave suitcase in the car. After surgery it may be brought to your room.  For patients admitted to the hospital, checkout time is 11:00 AM the day of discharge.   Patients discharged the day of surgery will not be allowed to drive home.    Special Instructions: Shower using CHG night before surgery and shower the day of surgery use CHG.  Use special wash - you have one bottle of CHG for all showers.  You should use approximately 1/2 of the bottle for each shower.  Trigger Finger  Trigger finger (stenosing tenosynovitis) is a condition that causes a finger to get stuck in a bent position. Each finger has a tough, cord-like tissue that connects muscle to bone (tendon), and each tendon is surrounded by a tunnel of tissue (tendon sheath). To move your finger, your tendon needs to slide freely through the sheath. Trigger finger happens when the tendon or the sheath thickens, making it difficult to move your finger. Trigger finger can affect any finger or a thumb. It may affect more than one finger. Mild cases may clear up with rest and medicine. Severe cases require more treatment. What are the causes? Trigger finger is caused by a thickened finger tendon or tendon sheath. The cause of this thickening is not known. What increases the risk? The following factors may make you more likely to develop this condition:  Doing activities that require a strong grip.  Having rheumatoid arthritis, gout,  or diabetes.  Being 34-58 years old.  Being a woman. What are the signs or symptoms? Symptoms of this condition include:  Pain when bending or straightening your finger.  Tenderness or swelling where your finger attaches to the palm of your hand.  A lump in the palm of your hand or on the inside of your finger.  Hearing a popping sound when you try to straighten your finger.  Feeling a popping, catching, or locking sensation when you try to straighten your finger.  Being unable to straighten your finger. How is this diagnosed? This condition is diagnosed based on your symptoms and a physical exam. How is this treated? This condition may be treated by:  Resting your finger and avoiding activities that make symptoms worse.  Wearing a finger splint to keep your finger in a slightly bent position.  Taking NSAIDs to relieve pain and swelling.  Injecting medicine (steroids) into the tendon sheath to reduce swelling and irritation. Injections may need to be repeated.  Having surgery to open the tendon sheath. This may be done if other treatments do not work and you cannot straighten your finger. You may need physical therapy after surgery. Follow these instructions at home:   Use moist heat to help reduce pain and swelling as told by your health care provider.  Rest your finger and avoid activities that make pain worse. Return to normal activities as told by  your health care provider.  If you have a splint, wear it as told by your health care provider.  Take over-the-counter and prescription medicines only as told by your health care provider.  Keep all follow-up visits as told by your health care provider. This is important. Contact a health care provider if:  Your symptoms are not improving with home care. Summary  Trigger finger (stenosing tenosynovitis) causes your finger to get stuck in a bent position, and it can make it difficult and painful to straighten your  finger.  This condition develops when a finger tendon or tendon sheath thickens.  Treatment starts with resting, wearing a splint, and taking NSAIDs.  In severe cases, surgery to open the tendon sheath may be needed. This information is not intended to replace advice given to you by your health care provider. Make sure you discuss any questions you have with your health care provider. Document Released: 12/10/2003 Document Revised: 02/01/2017 Document Reviewed: 01/31/2016 Elsevier Patient Education  2020 [place] Sutures are stitches that can be used to close wounds. Taking care of your wound properly can help prevent pain and infection. It can also help your wound to heal more quickly. Follow instructions from your doctor about how to care for your sutured wound. Supplies needed:  Soap and water.  A clean bandage (dressing), if needed.  Antibiotic ointment.  A clean towel. How to care for your sutured wound   Keep the wound completely dry for the first 24 hours or as long as told by your doctor. After 24-48 hours, you may shower or bathe as told by your doctor. Do not soak the wound or put the wound completely under water until the sutures have been removed.  After the first 24 hours, clean the wound once a day, or as often as your doctor tells you to. Take these steps: ? Wash the wound with soap and water. ? Rinse the wound with water. Make sure to wash all the soap off. ? Pat the wound dry with a clean towel. Do not rub the wound.  After cleaning the wound, put a thin layer of antibiotic ointment on the wound as told by your doctor. This will help: ? Prevent infection. ? Keep the bandage from sticking to the wound.  Follow instructions from your doctor about how to change your bandage: ? Wash your hands with soap and water. If you cannot use soap and water, use hand sanitizer. ? Change your bandage at least once a day, or as often as told by your  doctor. If your dressing gets wet or dirty, change it. ? Leave sutures, skin glue, or skin tape (adhesive) strips in place. They may need to stay in place for 2 weeks or longer. If tape strips get loose and curl up, you may trim the loose edges. Do not remove tape strips completely unless your doctor says it is okay.  Check your wound every day for signs of infection. Watch for: ? Redness, swelling, or pain. ? Fluid or blood. ? Warmth. ? Pus or a bad smell.  Have the sutures removed as told by your doctor. Follow these instructions at home: Medicines  Take or apply over-the-counter and prescription medicines only as told by your doctor.  If you were prescribed an antibiotic medicine or ointment, take or apply it as told by your doctor. Do not stop using the antibiotic even if you start to feel better. General instructions  Cover your wound  with clothes or put sunscreen on when you are outside. Use a sunscreen of at least 30 SPF.  Do not scratch or pick at your wound.  Avoid stretching your wound.  Raise (elevate) the injured area above the level of your heart while you are sitting or lying down, if possible.  Drink enough fluids to keep your pee (urine) clear or pale yellow.  Keep all follow-up visits as told by your doctor. This is important. Contact a doctor if:  You were given a tetanus shot and you have any of the following at the site where the needle went in: ? Swelling. ? Very bad pain. ? Redness. ? Bleeding.  Your wound breaks open.  You have redness, swelling, or pain around your wound.  You have fluid or blood coming from your wound.  Your wound feels warm to the touch.  You have a fever.  You notice something coming out of your wound, such as wood or glass.  You have pain that does not get better with medicine.  The skin near your wound changes color.  You need to change your bandage often due to a lot of fluid, blood, or pus coming from the wound.   You get a new rash.  You get numbness around the wound. Get help right away if:  You have very bad swelling around your wound.  You have pus or a bad smell coming from your wound.  Your pain suddenly gets worse and is very bad.  You have painful lumps near your wound or anywhere on your body.  You have a red streak going away from your wound.  The wound is on your hand or foot, and: ? You cannot move a finger or toe as you used to do. ? Your fingers or toes look pale or blue. ? You have numbness that spreads down your hand, foot, fingers, or toes. Summary  Sutures are stitches that are used to close wounds.  Taking care of your wound properly can help prevent pain and infection.  Keep the wound completely dry for the first 24 hours or for as long as told by your doctor. After 24-48 hours, you may shower or bathe as directed by your doctor. This information is not intended to replace advice given to you by your health care provider. Make sure you discuss any questions you have with your health care provider. Document Released: 08/08/2007 Document Revised: 02/01/2017 Document Reviewed: 03/27/2016 Elsevier Patient Education  2020 [employer] After These instructions provide you with information about caring for yourself after your procedure. Your health care provider may also give you more specific instructions. Your treatment has been planned according to current medical practices, but problems sometimes occur. Call your health care provider if you have any problems or questions after your procedure. What can I expect after the procedure? After your procedure, you may:  Feel sleepy for several hours.  Feel clumsy and have poor balance for several hours.  Feel forgetful about what happened after the procedure.  Have poor judgment for several hours.  Feel nauseous or vomit.  Have a sore throat if you had a breathing tube during the  procedure. Follow these instructions at home: For at least 24 hours after the procedure:      Have a responsible adult stay with you. It is important to have someone help care for you until you are awake and alert.  Rest as needed.  Do not: ?  Participate in activities in which you could fall or become injured. ? Drive. ? Use heavy machinery. ? Drink alcohol. ? Take sleeping pills or medicines that cause drowsiness. ? Make important decisions or sign legal documents. ? Take care of children on your own. Eating and drinking  Follow the diet that is recommended by your health care provider.  If you vomit, drink water, juice, or soup when you can drink without vomiting.  Make sure you have little or no nausea before eating solid foods. General instructions  Take over-the-counter and prescription medicines only as told by your health care provider.  If you have sleep apnea, surgery and certain medicines can increase your risk for breathing problems. Follow instructions from your health care provider about wearing your sleep device: ? Anytime you are sleeping, including during daytime naps. ? While taking prescription pain medicines, sleeping medicines, or medicines that make you drowsy.  If you smoke, do not smoke without supervision.  Keep all follow-up visits as told by your health care provider. This is important. Contact a health care provider if:  You keep feeling nauseous or you keep vomiting.  You feel light-headed.  You develop a rash.  You have a fever. Get help right away if:  You have trouble breathing. Summary  For several hours after your procedure, you may feel sleepy and have poor judgment.  Have a responsible adult stay with you for at least 24 hours or until you are awake and alert. This information is not intended to replace advice given to you by your health care provider. Make sure you discuss any questions you have with your health care provider.  Document Released: 06/12/2015 Document Revised: 05/20/2017 Document Reviewed: 06/12/2015 Elsevier Patient Education  2020 [employer].

## 2018-11-14 ENCOUNTER — Encounter (HOSPITAL_COMMUNITY)
Admission: RE | Admit: 2018-11-14 | Discharge: 2018-11-14 | Disposition: A | Discharge status: Home / Self Care | Payer: Medicare Other | Source: Ambulatory Visit | Attending: Orthopedic Surgery | Admitting: Orthopedic Surgery

## 2018-11-14 ENCOUNTER — Other Ambulatory Visit (HOSPITAL_COMMUNITY)
Admission: RE | Admit: 2018-11-14 | Discharge: 2018-11-14 | Disposition: A | Discharge status: Home / Self Care | Payer: Medicare Other | Source: Ambulatory Visit | Attending: Orthopedic Surgery | Admitting: Orthopedic Surgery

## 2018-11-14 ENCOUNTER — Other Ambulatory Visit: Payer: Self-pay

## 2018-11-14 DIAGNOSIS — M65341 Trigger finger, right ring finger: Secondary | ICD-10-CM | POA: Insufficient documentation

## 2018-11-14 DIAGNOSIS — Z20828 Contact with and (suspected) exposure to other viral communicable diseases: Secondary | ICD-10-CM | POA: Diagnosis not present

## 2018-11-14 DIAGNOSIS — Z01812 Encounter for preprocedural laboratory examination: Secondary | ICD-10-CM | POA: Diagnosis not present

## 2018-11-14 LAB — CBC WITH DIFFERENTIAL/PLATELET
Abs Immature Granulocytes: 0.02 10*3/uL (ref 0.00–0.07)
Basophils Absolute: 0 10*3/uL (ref 0.0–0.1)
Basophils Relative: 0 %
Eosinophils Absolute: 0.2 10*3/uL (ref 0.0–0.5)
Eosinophils Relative: 2 %
HCT: 41.3 % (ref 36.0–46.0)
Hemoglobin: 13.4 g/dL (ref 12.0–15.0)
Immature Granulocytes: 0 %
Lymphocytes Relative: 39 %
Lymphs Abs: 2.8 10*3/uL (ref 0.7–4.0)
MCH: 31.7 pg (ref 26.0–34.0)
MCHC: 32.4 g/dL (ref 30.0–36.0)
MCV: 97.6 fL (ref 80.0–100.0)
Monocytes Absolute: 0.5 10*3/uL (ref 0.1–1.0)
Monocytes Relative: 7 %
Neutro Abs: 3.8 10*3/uL (ref 1.7–7.7)
Neutrophils Relative %: 52 %
Platelets: 242 10*3/uL (ref 150–400)
RBC: 4.23 MIL/uL (ref 3.87–5.11)
RDW: 13.7 % (ref 11.5–15.5)
WBC: 7.4 10*3/uL (ref 4.0–10.5)
nRBC: 0 % (ref 0.0–0.2)

## 2018-11-14 LAB — BASIC METABOLIC PANEL
Anion gap: 9 (ref 5–15)
BUN: 11 mg/dL (ref 6–20)
CO2: 20 mmol/L — ABNORMAL LOW (ref 22–32)
Calcium: 8.8 mg/dL — ABNORMAL LOW (ref 8.9–10.3)
Chloride: 110 mmol/L (ref 98–111)
Creatinine, Ser: 0.91 mg/dL (ref 0.44–1.00)
GFR calc Af Amer: >60 mL/min (ref >60)
GFR calc non Af Amer: >60 mL/min (ref >60)
Glucose, Bld: 105 mg/dL — ABNORMAL HIGH (ref 70–99)
Potassium: 3.5 mmol/L (ref 3.5–5.1)
Sodium: 139 mmol/L (ref 135–145)

## 2018-11-14 LAB — SARS CORONAVIRUS 2 (TAT 6-24 HRS): SARS Coronavirus 2: NEGATIVE

## 2018-11-18 ENCOUNTER — Ambulatory Visit (HOSPITAL_COMMUNITY): Payer: Medicare Other | Admitting: Certified Registered Nurse Anesthetist

## 2018-11-18 ENCOUNTER — Ambulatory Visit (HOSPITAL_COMMUNITY)
Admission: RE | Admit: 2018-11-18 | Discharge: 2018-11-18 | Disposition: A | Discharge status: Home / Self Care | Payer: Medicare Other | Attending: Orthopedic Surgery | Admitting: Orthopedic Surgery

## 2018-11-18 ENCOUNTER — Encounter (HOSPITAL_COMMUNITY): Payer: Self-pay | Admitting: Emergency Medicine

## 2018-11-18 ENCOUNTER — Other Ambulatory Visit: Payer: Self-pay

## 2018-11-18 ENCOUNTER — Encounter (HOSPITAL_COMMUNITY)
Admission: RE | Disposition: A | Discharge status: Home / Self Care | Payer: Self-pay | Source: Home / Self Care | Attending: Orthopedic Surgery

## 2018-11-18 DIAGNOSIS — M65341 Trigger finger, right ring finger: Secondary | ICD-10-CM | POA: Diagnosis not present

## 2018-11-18 DIAGNOSIS — M65331 Trigger finger, right middle finger: Secondary | ICD-10-CM | POA: Diagnosis not present

## 2018-11-18 DIAGNOSIS — I10 Essential (primary) hypertension: Secondary | ICD-10-CM | POA: Insufficient documentation

## 2018-11-18 DIAGNOSIS — Z87891 Personal history of nicotine dependence: Secondary | ICD-10-CM | POA: Diagnosis not present

## 2018-11-18 HISTORY — PX: TRIGGER FINGER RELEASE: SHX641

## 2018-11-18 SURGERY — RELEASE, A1 PULLEY, FOR TRIGGER FINGER
Anesthesia: Monitor Anesthesia Care | Site: Ring Finger | Laterality: Right

## 2018-11-18 MED ORDER — LACTATED RINGERS IV SOLN
Freq: Once | INTRAVENOUS | Status: AC
  Administered 2018-11-18: 07:00:00 via INTRAVENOUS

## 2018-11-18 MED ORDER — PROPOFOL 500 MG/50ML IV EMUL
INTRAVENOUS | Status: DC | PRN
  Administered 2018-11-18: 125 ug/kg/min via INTRAVENOUS

## 2018-11-18 MED ORDER — MIDAZOLAM HCL 2 MG/2ML IJ SOLN
INTRAMUSCULAR | Status: AC
  Filled 2018-11-18: qty 2

## 2018-11-18 MED ORDER — CHLORHEXIDINE GLUCONATE 4 % EX LIQD: 60.0000 mL | Freq: Once | CUTANEOUS | Status: DC

## 2018-11-18 MED ORDER — LIDOCAINE HCL (PF) 0.5 % IJ SOLN
INTRAMUSCULAR | Status: DC | PRN
  Administered 2018-11-18: 50 mL via INTRAVENOUS

## 2018-11-18 MED ORDER — FENTANYL CITRATE (PF) 100 MCG/2ML IJ SOLN
INTRAMUSCULAR | Status: DC | PRN
  Administered 2018-11-18 (×2): 50 ug via INTRAVENOUS

## 2018-11-18 MED ORDER — LIDOCAINE HCL (PF) 0.5 % IJ SOLN
INTRAMUSCULAR | Status: AC
  Filled 2018-11-18: qty 50

## 2018-11-18 MED ORDER — 0.9 % SODIUM CHLORIDE (POUR BTL) OPTIME
TOPICAL | Status: DC | PRN
  Administered 2018-11-18: 1000 mL

## 2018-11-18 MED ORDER — CEFAZOLIN SODIUM-DEXTROSE 2-4 GM/100ML-% IV SOLN
INTRAVENOUS | Status: AC
  Filled 2018-11-18: qty 100

## 2018-11-18 MED ORDER — ONDANSETRON HCL 4 MG/2ML IJ SOLN
INTRAMUSCULAR | Status: AC
  Filled 2018-11-18: qty 2

## 2018-11-18 MED ORDER — HYDROCODONE-ACETAMINOPHEN 5-325 MG PO TABS: 1.0000 | ORAL_TABLET | ORAL | 0 refills | Status: AC | PRN

## 2018-11-18 MED ORDER — LACTATED RINGERS IV SOLN
INTRAVENOUS | Status: DC | PRN
  Administered 2018-11-18: 07:00:00 via INTRAVENOUS

## 2018-11-18 MED ORDER — PROPOFOL 10 MG/ML IV BOLUS
INTRAVENOUS | Status: AC
  Filled 2018-11-18: qty 40

## 2018-11-18 MED ORDER — MIDAZOLAM HCL 5 MG/5ML IJ SOLN
INTRAMUSCULAR | Status: DC | PRN
  Administered 2018-11-18 (×2): 1 mg via INTRAVENOUS

## 2018-11-18 MED ORDER — PROPOFOL 10 MG/ML IV BOLUS
INTRAVENOUS | Status: DC | PRN
  Administered 2018-11-18: 40 mg via INTRAVENOUS

## 2018-11-18 MED ORDER — MEPERIDINE HCL 50 MG/ML IJ SOLN: 6.2500 mg | INTRAMUSCULAR | Status: DC | PRN

## 2018-11-18 MED ORDER — HYDROMORPHONE HCL 1 MG/ML IJ SOLN
0.2500 mg | INTRAMUSCULAR | Status: DC | PRN
  Administered 2018-11-18: 0.25 mg via INTRAVENOUS
  Filled 2018-11-18: qty 0.5

## 2018-11-18 MED ORDER — CEFAZOLIN SODIUM-DEXTROSE 2-4 GM/100ML-% IV SOLN
2.0000 g | INTRAVENOUS | Status: AC
  Administered 2018-11-18: 07:00:00 2 g via INTRAVENOUS

## 2018-11-18 MED ORDER — ONDANSETRON HCL 4 MG/2ML IJ SOLN
INTRAMUSCULAR | Status: DC | PRN
  Administered 2018-11-18: 4 mg via INTRAVENOUS

## 2018-11-18 MED ORDER — BUPIVACAINE HCL (PF) 0.5 % IJ SOLN
INTRAMUSCULAR | Status: DC | PRN
  Administered 2018-11-18: 10 mL

## 2018-11-18 MED ORDER — ONDANSETRON HCL 4 MG/2ML IJ SOLN: 4.0000 mg | Freq: Once | INTRAMUSCULAR | Status: DC | PRN

## 2018-11-18 MED ORDER — BUPIVACAINE HCL (PF) 0.5 % IJ SOLN
INTRAMUSCULAR | Status: AC
  Filled 2018-11-18: qty 30

## 2018-11-18 MED ORDER — FENTANYL CITRATE (PF) 100 MCG/2ML IJ SOLN
INTRAMUSCULAR | Status: AC
  Filled 2018-11-18: qty 2

## 2018-11-18 SURGICAL SUPPLY — 44 items
BANDAGE ESMARK 4X12 BL STRL LF (DISPOSABLE) ×1 IMPLANT
BLADE SURG 15 STRL LF DISP TIS (BLADE) ×1 IMPLANT
BLADE SURG 15 STRL SS (BLADE) ×1
BNDG COHESIVE 3X5 TAN STRL LF (GAUZE/BANDAGES/DRESSINGS) ×2 IMPLANT
BNDG CONFORM 2 STRL LF (GAUZE/BANDAGES/DRESSINGS) ×2 IMPLANT
BNDG ELASTIC 2X5.8 VLCR NS LF (GAUZE/BANDAGES/DRESSINGS) ×1 IMPLANT
BNDG ELASTIC 2X5.8 VLCR STR LF (GAUZE/BANDAGES/DRESSINGS) ×2 IMPLANT
BNDG ESMARK 4X12 BLUE STRL LF (DISPOSABLE) ×2
CHLORAPREP W/TINT 26 (MISCELLANEOUS) ×2 IMPLANT
CLOTH BEACON ORANGE TIMEOUT ST (SAFETY) ×2 IMPLANT
COVER LIGHT HANDLE STERIS (MISCELLANEOUS) ×4 IMPLANT
COVER WAND RF STERILE (DRAPES) ×2 IMPLANT
CUFF TOURN SGL QUICK 18X4 (TOURNIQUET CUFF) ×2 IMPLANT
CUFF TOURN SGL QUICK 24 (TOURNIQUET CUFF) ×1
CUFF TRNQT CYL 24X4X16.5-23 (TOURNIQUET CUFF) ×1 IMPLANT
DECANTER SPIKE VIAL GLASS SM (MISCELLANEOUS) ×2 IMPLANT
DRAPE HALF SHEET 40X57 (DRAPES) ×2 IMPLANT
DRSG XEROFORM 1X8 (GAUZE/BANDAGES/DRESSINGS) ×1 IMPLANT
ELECT NDL TIP 2.8 STRL (NEEDLE) ×1 IMPLANT
ELECT NEEDLE TIP 2.8 STRL (NEEDLE) ×2 IMPLANT
ELECT REM PT RETURN 9FT ADLT (ELECTROSURGICAL) ×2
ELECTRODE REM PT RTRN 9FT ADLT (ELECTROSURGICAL) ×1 IMPLANT
GAUZE SPONGE 4X4 12PLY STRL (GAUZE/BANDAGES/DRESSINGS) ×2 IMPLANT
GAUZE XEROFORM 1X8 LF (GAUZE/BANDAGES/DRESSINGS) ×2 IMPLANT
GLOVE BIO SURGEON STRL SZ7 (GLOVE) ×1 IMPLANT
GLOVE BIOGEL PI IND STRL 7.0 (GLOVE) ×1 IMPLANT
GLOVE BIOGEL PI INDICATOR 7.0 (GLOVE) ×2
GLOVE SKINSENSE NS SZ8.0 LF (GLOVE) ×1
GLOVE SKINSENSE STRL SZ8.0 LF (GLOVE) ×1 IMPLANT
GLOVE SS N UNI LF 8.5 STRL (GLOVE) ×2 IMPLANT
GOWN STRL REUS W/TWL LRG LVL3 (GOWN DISPOSABLE) ×2 IMPLANT
GOWN STRL REUS W/TWL XL LVL3 (GOWN DISPOSABLE) ×2 IMPLANT
KIT TURNOVER KIT A (KITS) ×2 IMPLANT
MANIFOLD NEPTUNE II (INSTRUMENTS) ×2 IMPLANT
NDL HYPO 21X1.5 SAFETY (NEEDLE) ×1 IMPLANT
NEEDLE HYPO 21X1.5 SAFETY (NEEDLE) ×2 IMPLANT
NS IRRIG 1000ML POUR BTL (IV SOLUTION) ×2 IMPLANT
PACK BASIC LIMB (CUSTOM PROCEDURE TRAY) ×2 IMPLANT
PAD ARMBOARD 7.5X6 YLW CONV (MISCELLANEOUS) ×2 IMPLANT
POSITIONER HAND ALUMI XLG (MISCELLANEOUS) ×2 IMPLANT
SET BASIN LINEN APH (SET/KITS/TRAYS/PACK) ×2 IMPLANT
SPONGE GAUZE 2X2 8PLY STRL LF (GAUZE/BANDAGES/DRESSINGS) ×1 IMPLANT
SUT ETHILON 3 0 FSL (SUTURE) ×2 IMPLANT
SYR CONTROL 10ML LL (SYRINGE) ×2 IMPLANT

## 2018-11-18 NOTE — Anesthesia Procedure Notes (Signed)
Procedure Name: MAC Date/Time: 11/18/2018 7:28 AM Performed by: Vista Deck, CRNA Pre-anesthesia Checklist: Patient identified, Emergency Drugs available, Suction available, Timeout performed and Patient being monitored Patient Re-evaluated:Patient Re-evaluated prior to induction Oxygen Delivery Method: Nasal Cannula

## 2018-11-18 NOTE — Transfer of Care (Signed)
Immediate Anesthesia Transfer of Care Note  Patient: Stacy Wolf  Procedure(s) Performed: RELEASE TRIGGER FINGER/A-1 PULLEY right ring (Right Ring Finger)  Patient Location: PACU  Anesthesia Type:MAC and Bier block  Level of Consciousness: awake, alert  and patient cooperative  Airway & Oxygen Therapy: Patient Spontanous Breathing  Post-op Assessment: Report given to RN and Post -op Vital signs reviewed and stable  Post vital signs: Reviewed and stable  Last Vitals:  Vitals Value Taken Time  BP 118/81 11/18/18 0812  Temp 36.5 C 11/18/18 0812  Pulse 58 11/18/18 0812  Resp 18 11/18/18 0812  SpO2      Last Pain:  Vitals:   11/18/18 0631  TempSrc: Oral  PainSc: 0-No pain         Complications: No apparent anesthesia complications

## 2018-11-18 NOTE — Brief Op Note (Signed)
11/18/2018  8:12 AM  PATIENT:  Stacy Wolf  43 y.o. female  PRE-OPERATIVE DIAGNOSIS:  right ring trigger finger  POST-OPERATIVE DIAGNOSIS:  right ring trigger finger  Findings at surgery: Stenosing tenosynovitis at the A1 pulley of the right ring finger  PROCEDURE:  Procedure(s): RELEASE TRIGGER FINGER/A-1 PULLEY right ring (Right)-26055  SURGEON:  Surgeon(s) and Role:    [name], MD - Primary  Details of procedure.  The patient was seen in preop identity was confirmed and the surgical site was confirmed chart review was completed surgical site was marked right ring finger.  Patient taken to surgery for Bier block.  Successful Bier block followed by sterile prep and drape and timeout.  Surgical site and procedure confirmed  Antibiotics sent  A transverse incision was made over the A1 pulley of the right ring finger subcutaneous tissue was divided bluntly neurovascular structures were protected with right angle retractors.  A1 pulley was identified and then incised with scissors.  Finger was flexed and extended to make sure there was no further impediment and the wound was irrigated and closed with interrupted horizontal mattress sutures x3.  10 cc of plain Marcaine injected into the wound  Sterile dressing applied tourniquet released pressure held on the dressing good color and capillary refill noted  Patient taken recovery room stable condition  PHYSICIAN ASSISTANT:   ASSISTANTS: none   ANESTHESIA:   regional  EBL:  0 mL   BLOOD ADMINISTERED:none  DRAINS: none   LOCAL MEDICATIONS USED:  MARCAINE     SPECIMEN:  No Specimen  DISPOSITION OF SPECIMEN:  N/A  COUNTS:  YES  TOURNIQUET:   Total Tourniquet Time Documented: Upper Arm (Right) - 27 minutes Total: Upper Arm (Right) - 27 minutes   DICTATION: .Viviann Spare Dictation  PLAN OF CARE: Discharge to home after PACU  PATIENT DISPOSITION:  PACU - hemodynamically stable.   Delay start of  Pharmacological VTE agent (>24hrs) due to surgical blood loss or risk of bleeding: no

## 2018-11-18 NOTE — Anesthesia Preprocedure Evaluation (Addendum)
Anesthesia Evaluation  Patient identified by MRN, date of birth, ID band Patient awake    Reviewed: Allergy & Precautions, NPO status , Patient's Chart, lab work & pertinent test results, reviewed documented beta blocker date and time   Airway Mallampati: II  TM Distance: >3 FB Neck ROM: Full    Dental  (+) Chipped Upper chipped tooth:   Pulmonary Patient abstained from smoking., former smoker,    Pulmonary exam normal breath sounds clear to auscultation       Cardiovascular Exercise Tolerance: Good hypertension (not on meds), Normal cardiovascular exam+ Valvular Problems/Murmurs (patient denied)  Rhythm:Regular Rate:Normal     Neuro/Psych  Headaches, PSYCHIATRIC DISORDERS Anxiety Bipolar Disorder Schizophrenia  Neuromuscular disease    GI/Hepatic GERD (not on meds, mild)  ,(+)     substance abuse (last use - 2 months ago)  marijuana use,   Endo/Other  negative endocrine ROS  Renal/GU negative Renal ROS     Musculoskeletal   Abdominal   Peds  Hematology   Anesthesia Other Findings   Reproductive/Obstetrics                           Anesthesia Physical Anesthesia Plan  ASA: II  Anesthesia Plan: Bier Block and MAC and Bier Block-LIDOCAINE ONLY   Post-op Pain Management:    Induction:   PONV Risk Score and Plan:   Airway Management Planned: Nasal Cannula, Natural Airway and Simple Face Mask  Additional Equipment:   Intra-op Plan:   Post-operative Plan:   Informed Consent: I have reviewed the patients History and Physical, chart, labs and discussed the procedure including the risks, benefits and alternatives for the proposed anesthesia with the patient or authorized representative who has indicated his/her understanding and acceptance.     Dental advisory given  Plan Discussed with: CRNA  Anesthesia Plan Comments:        Anesthesia Quick Evaluation

## 2018-11-18 NOTE — Anesthesia Procedure Notes (Signed)
Procedure Name: MAC Date/Time: 11/18/2018 7:48 AM Performed by: Vista Deck, CRNA Pre-anesthesia Checklist: Patient identified, Emergency Drugs available, Suction available, Timeout performed and Patient being monitored Patient Re-evaluated:Patient Re-evaluated prior to induction Oxygen Delivery Method: Non-rebreather mask

## 2018-11-18 NOTE — Anesthesia Postprocedure Evaluation (Signed)
Anesthesia Post Note  Patient: Stacy Wolf  Procedure(s) Performed: RELEASE TRIGGER FINGER/A-1 PULLEY right ring (Right Ring Finger)  Patient location during evaluation: Phase II Anesthesia Type: MAC and Bier Block Level of consciousness: awake and alert, oriented and patient cooperative Pain management: satisfactory to patient Vital Signs Assessment: post-procedure vital signs reviewed and stable Respiratory status: spontaneous breathing Cardiovascular status: stable Postop Assessment: no apparent nausea or vomiting Anesthetic complications: no     Last Vitals:  Vitals:   11/18/18 0838 11/18/18 0856  BP: 127/88   Pulse:  (!) 54  Resp:  16  Temp:  36.5 C  SpO2: 100% 98%    Last Pain:  Vitals:   11/18/18 0856  TempSrc: Oral  PainSc: 0-No pain                 Ople Girgis

## 2018-11-18 NOTE — Anesthesia Procedure Notes (Signed)
Anesthesia Regional Block: Bier block (IV Regional)   Pre-Anesthetic Checklist: ,, timeout performed, Correct Patient, Correct Site, Correct Laterality, Correct Procedure, Correct Position, site marked, Risks and benefits discussed, Surgical consent,  Pre-op evaluation,  At surgeon's request  Laterality: Right     Needles:  Injection technique: Single-shot  Needle Type: Other      Needle Gauge: 20     Additional Needles:   Procedures:,,,,, intact distal pulses, Esmarch exsanguination, single tourniquet utilized,   Nerve Stimulator or Paresthesia:   Additional Responses:  Pulse checked post tourniquet inflation. IV NSL discontinued post injection. Narrative:  Start time: 11/18/2018 7:38 AM End time: 11/18/2018 7:40 AM  Performed by: Personally

## 2018-11-18 NOTE — Interval H&P Note (Signed)
History and Physical Interval Note:  11/18/2018 7:24 AM  Stacy Wolf  has presented today for surgery, with the diagnosis of right ring trigger finger.  The various methods of treatment have been discussed with the patient and family. After consideration of risks, benefits and other options for treatment, the patient has consented to  Procedure(s): RELEASE TRIGGER FINGER/A-1 PULLEY right ring (Right) as a surgical intervention.  The patient's history has been reviewed, patient examined, no change in status, stable for surgery.  I have reviewed the patient's chart and labs.  Questions were answered to the patient's satisfaction.     Arther Abbott

## 2018-11-18 NOTE — Op Note (Addendum)
11/18/2018  8:12 AM  PATIENT:  Stacy Wolf  43 y.o. female  PRE-OPERATIVE DIAGNOSIS:  right ring trigger finger  POST-OPERATIVE DIAGNOSIS:  right ring trigger finger  Findings at surgery: Stenosing tenosynovitis at the A1 pulley of the right ring finger  PROCEDURE:  Procedure(s): RELEASE TRIGGER FINGER/A-1 PULLEY right ring (Right)-[id number]  SURGEON:  Surgeon(s) and Role:    [name], MD - Primary  Details of procedure.  The patient was seen in preop identity was confirmed and the surgical site was confirmed chart review was completed surgical site was marked right ring finger.  Patient taken to surgery for Bier block.  Successful Bier block followed by sterile prep and drape and timeout.  Surgical site and procedure confirmed  Antibiotics sent  A transverse incision was made over the A1 pulley of the right ring finger subcutaneous tissue was divided bluntly neurovascular structures were protected with right angle retractors.  A1 pulley was identified and then incised with scissors.  Finger was flexed and extended to make sure there was no further impediment and the wound was irrigated and closed with interrupted horizontal mattress sutures x3.  10 cc of plain Marcaine injected into the wound  Sterile dressing applied tourniquet released pressure held on the dressing good color and capillary refill noted  Patient taken recovery room stable condition  PHYSICIAN ASSISTANT:   ASSISTANTS: none   ANESTHESIA:   regional  EBL:  0 mL   BLOOD ADMINISTERED:none  DRAINS: none   LOCAL MEDICATIONS USED:  MARCAINE     SPECIMEN:  No Specimen  DISPOSITION OF SPECIMEN:  N/A  COUNTS:  YES  TOURNIQUET:   Total Tourniquet Time Documented: Upper Arm (Right) - 27 minutes Total: Upper Arm (Right) - 27 minutes   DICTATION: .Viviann Spare Dictation  PLAN OF CARE: Discharge to home after PACU  PATIENT DISPOSITION:  PACU - hemodynamically stable.   Delay start of  Pharmacological VTE agent (>24hrs) due to surgical blood loss or risk of bleeding: no

## 2018-11-18 NOTE — H&P (Signed)
  Chief Complaint  Patient presents with  . Hand Problem    fingers catch and are painful    S/P CTR  RIGHT JAN 2020  C/O severe dull sometimes sharp nonradiating pain right ring finger with catching locking tenderness over the A1 pulley no relief except with opioid medication  Review of Systems  All other systems reviewed and are negative.      Past Medical History:  Diagnosis Date  . Anxiety   . Bipolar 1 disorder ([place])   . Heart murmur   . Migraines   . Schizophrenia [facility])         Past Surgical History:  Procedure Laterality Date  . arm surgery Right    tendonitis-wrist  . BILATERAL SALPINGECTOMY Bilateral 08/29/2016   Procedure: BILATERAL SALPINGECTOMY;  Surgeon: Florian Buff, MD;  Location: [facility];  Service: Gynecology;  Laterality: Bilateral;  . CARPAL TUNNEL RELEASE Left 04/11/2016   Procedure: CARPAL TUNNEL RELEASE;  Surgeon: [name], MD;  Location: [facility];  Service: Orthopedics;  Laterality: Left;  . CARPAL TUNNEL RELEASE Right 03/13/2018   Procedure: RIGHT CARPAL TUNNEL RELEASE;  Surgeon: [name], MD;  Location: [facility];  Service: Orthopedics;  Laterality: Right;  . SUPRACERVICAL ABDOMINAL HYSTERECTOMY N/A 08/29/2016   Procedure: HYSTERECTOMY SUPRACERVICAL ABDOMINAL;  Surgeon: Florian Buff, MD;  Location: [facility];  Service: Gynecology;  Laterality: N/A;  . TUBAL LIGATION     Social History        Tobacco Use  . Smoking status: Former Smoker    Packs/day: 0.50    Years: 7.00    Pack years: 3.50    Types: Cigarettes    Quit date: 11/05/2017    Years since quitting: 1.0  . Smokeless tobacco: Never Used  Substance Use Topics  . Alcohol use: Yes  . Drug use: Yes    Types: Marijuana    Comment: once a week   BP 123/79   Pulse 75   Ht 5\' 3"  (1.6 m)   Wt 175 lb (79.4 kg)   LMP 08/10/2016   BMI 31.00 kg/m   General appearance normal well-groomed  She is oriented to person place and time  normally  Her mood is pleasant her affect is normal  She walks normally without any gait disturbance  Then in the right hand we see normal alignment in the hand with tenderness over the A1 pulley catching on flexion extension of the right ring finger with tenderness there otherwise stable normal grip strength skin is normal pulse and perfusion are normal lymph nodes are normal in the right arm she has no sensory deficit she has a scar over the carpal tunnel Deep tendon reflexes are normal no pathologic reflexes coordination and balance are normal      Encounter Diagnosis  Name Primary?  . Trigger finger, right ring finger Yes    Options surgery versus injection she opted for surgery on the right ring finger for   right ring finger trigger finger release  The procedure has been fully reviewed with the patient; The risks and benefits of surgery have been discussed and explained and understood. Alternative treatment has also been reviewed, questions were encouraged and answered. The postoperative plan is also been reviewed.

## 2018-11-19 ENCOUNTER — Encounter (HOSPITAL_COMMUNITY): Payer: Self-pay | Admitting: Orthopedic Surgery

## 2018-11-24 ENCOUNTER — Other Ambulatory Visit: Payer: Self-pay

## 2018-11-24 ENCOUNTER — Encounter: Payer: Self-pay | Admitting: Orthopedic Surgery

## 2018-11-24 ENCOUNTER — Ambulatory Visit (INDEPENDENT_AMBULATORY_CARE_PROVIDER_SITE_OTHER): Payer: Medicare Other | Admitting: Orthopedic Surgery

## 2018-11-24 DIAGNOSIS — G8918 Other acute postprocedural pain: Secondary | ICD-10-CM

## 2018-11-24 DIAGNOSIS — Z9889 Other specified postprocedural states: Secondary | ICD-10-CM

## 2018-11-24 MED ORDER — HYDROCODONE-ACETAMINOPHEN 10-325 MG PO TABS: 1.0000 | ORAL_TABLET | Freq: Four times a day (QID) | ORAL | 0 refills | Status: AC | PRN

## 2018-11-24 MED ORDER — HYDROCODONE-ACETAMINOPHEN 10-325 MG PO TABS: 1.0000 | ORAL_TABLET | Freq: Four times a day (QID) | ORAL | 0 refills | Status: DC | PRN

## 2018-11-24 NOTE — Progress Notes (Signed)
POSTOP VISIT  POD #6 S/p trigger finger release right ring finger  Chief Complaint  Patient presents with  . Post-op Follow-up    right trigger finger release 11/18/18    11/18/2018  PATIENT:  Stacy Wolf  43 y.o. female  PRE-OPERATIVE DIAGNOSIS:  right ring trigger finger  POST-OPERATIVE DIAGNOSIS:  right ring trigger finger  Findings at surgery: Stenosing tenosynovitis at the A1 pulley of the right ring finger  PROCEDURE:  Procedure(s): RELEASE TRIGGER FINGER/A-1 PULLEY right ring [phone]  SURGEON:  Surgeon(s) and Role:    [name], MD - Primary     Encounter Diagnoses  Name Primary?  . Post-operative pain Yes  . Status post trigger finger release right ring finger     Patient complains that she has to take 2 pills instead of 1 of the 5 mg hydrocodone's so I increased it to 10 for 1 week  No signs of infection she has full flexion extension of the finger with pain  Remove sutures 1 week  Postoperative plan (Work, [employer],  Meds ordered this encounter  Medications  . DISCONTD: HYDROcodone-acetaminophen (NORCO) 10-325 MG tablet    Sig: Take 1 tablet by mouth every 6 (six) hours as needed for up to 9 days.    Dispense:  36 tablet    Refill:  0  . HYDROcodone-acetaminophen (NORCO) 10-325 MG tablet    Sig: Take 1 tablet by mouth every 6 (six) hours as needed for up to 9 days.    Dispense:  36 tablet    Refill:  0  ,FU)  Meds ordered this encounter  Medications  . DISCONTD: HYDROcodone-acetaminophen (NORCO) 10-325 MG tablet    Sig: Take 1 tablet by mouth every 6 (six) hours as needed for up to 9 days.    Dispense:  36 tablet    Refill:  0  . HYDROcodone-acetaminophen (NORCO) 10-325 MG tablet    Sig: Take 1 tablet by mouth every 6 (six) hours as needed for up to 9 days.    Dispense:  36 tablet    Refill:  0

## 2018-12-03 ENCOUNTER — Other Ambulatory Visit: Payer: Self-pay

## 2018-12-03 ENCOUNTER — Ambulatory Visit (INDEPENDENT_AMBULATORY_CARE_PROVIDER_SITE_OTHER): Payer: Medicare Other | Admitting: Orthopedic Surgery

## 2018-12-03 VITALS — BP 129/78 | HR 69 | Temp 97.3°F | Ht 63.0 in | Wt 174.0 lb

## 2018-12-03 DIAGNOSIS — M65341 Trigger finger, right ring finger: Secondary | ICD-10-CM

## 2018-12-03 DIAGNOSIS — Z9889 Other specified postprocedural states: Secondary | ICD-10-CM

## 2018-12-03 NOTE — Progress Notes (Signed)
POST OP VISIT   Chief Complaint  Patient presents with  . Follow-up    Recheck on right ring finger release, DOS 11-18-18.   S/P TRIGGER FINGER RELEASE RIGHT RING FINGER POD 15   Encounter Diagnoses  Name Primary?  . Status post trigger finger release Yes  . Trigger finger, right ring finger      Current Outpatient Medications:  .  aspirin-acetaminophen-caffeine (EXCEDRIN MIGRAINE) 250-250-65 MG tablet, Take 2 tablets by mouth every 6 (six) hours as needed for headache or migraine. , Disp: , Rfl:  .  estradiol (ESTRACE) 2 MG tablet, TAKE 1 TABLET BY MOUTH EVERY DAY (Patient taking differently: Take 2 mg by mouth at bedtime. ), Disp: 90 tablet, Rfl: 4 .  HYDROcodone-acetaminophen (NORCO) 10-325 MG tablet, Take 1 tablet by mouth every 6 (six) hours as needed for up to 9 days., Disp: 36 tablet, Rfl: 0 .  topiramate (TOPAMAX) 50 MG tablet, Take 1 tablet (50 mg total) by mouth at bedtime., Disp: 90 tablet, Rfl: 3  THERE ARE NO COMPLAINTS   THE WOUND LOOKS CLEAN AND THERE ARE NO SIGNS OF ERYTHEMA  NEUROVASCULAR EXAM IS NORMAL   EVERYTHING LOOKS GOOD she does have some swelling she is bending almost full flexion almost full extension just missing the last 10 degrees each direction  FU WILL BE SCHEDULED FOR 4 WEEKS

## 2018-12-08 IMAGING — CT CT ABD-PELV W/ CM
2 of 5 series · 16 of 46 positions shown, 18 images · IV contrast (iopamidol)
Comparison: Ultrasound 11/19/2017

CLINICAL DATA: Abnormal pelvic ultrasound.

EXAM:
CT ABDOMEN AND PELVIS WITH CONTRAST
TECHNIQUE: Multidetector CT imaging of the abdomen and pelvis was performed
using the standard protocol following bolus administration of
intravenous contrast.
CONTRAST:  100mL NCI75U-HRR IOPAMIDOL (NCI75U-HRR) INJECTION 61%

[Series 2: axial st · axial · 0.72mm/px · z∈[-620,-190]mm · 13 of 98 slices shown, 15 images]
[im 6/98  soft-tissue]
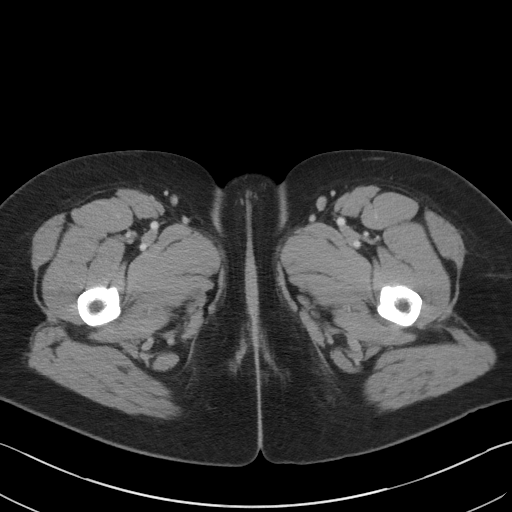
[im 6/98  bone]
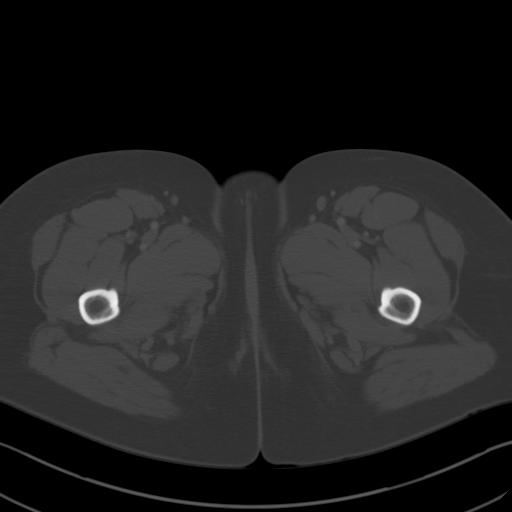
[im 12/98  soft-tissue]
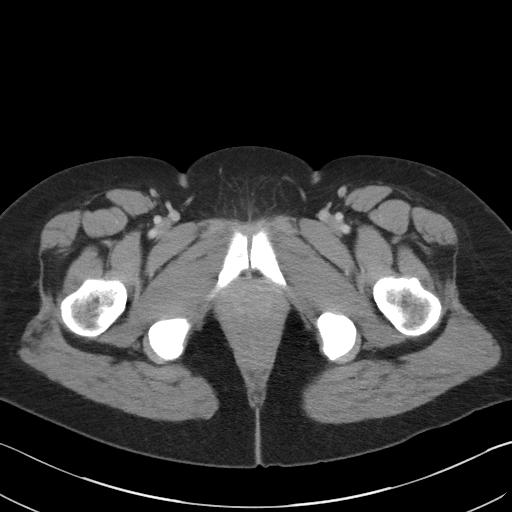
[im 23/98  soft-tissue]
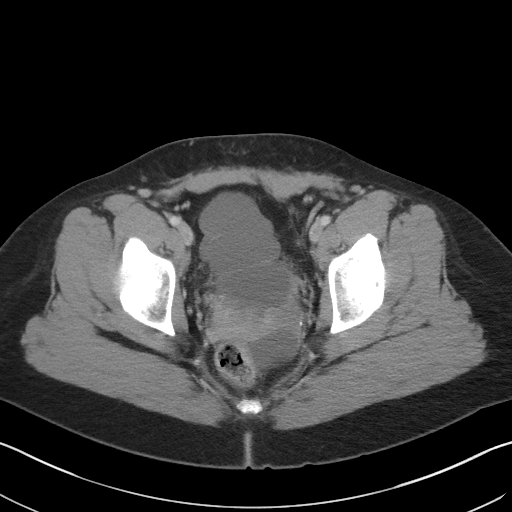
[im 29/98  soft-tissue]
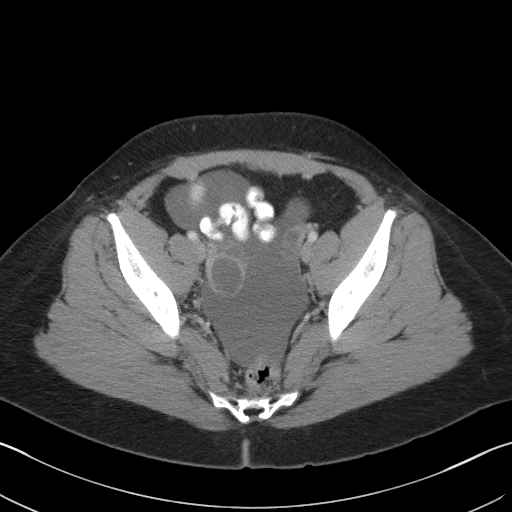
[im 35/98  soft-tissue]
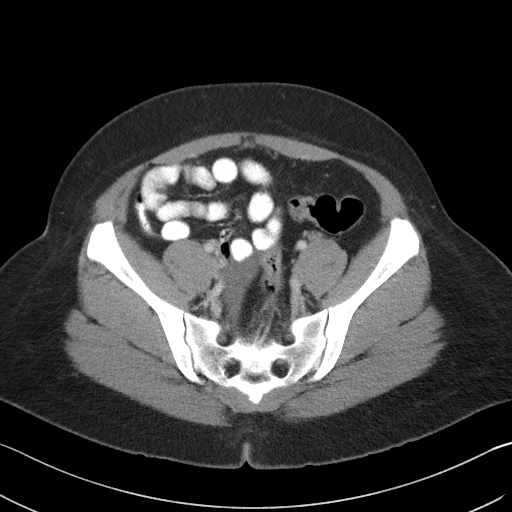
[im 40/98  soft-tissue]
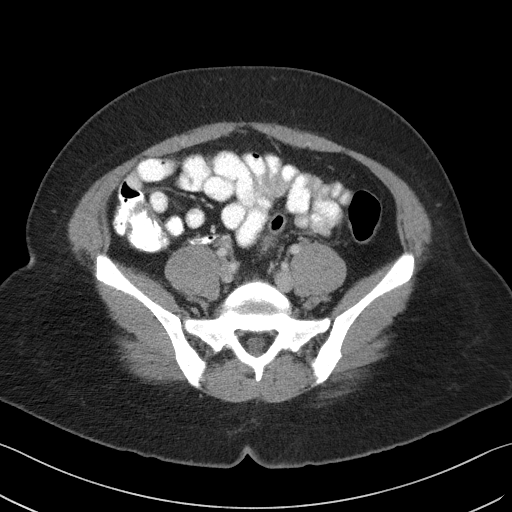
[im 52/98  soft-tissue]
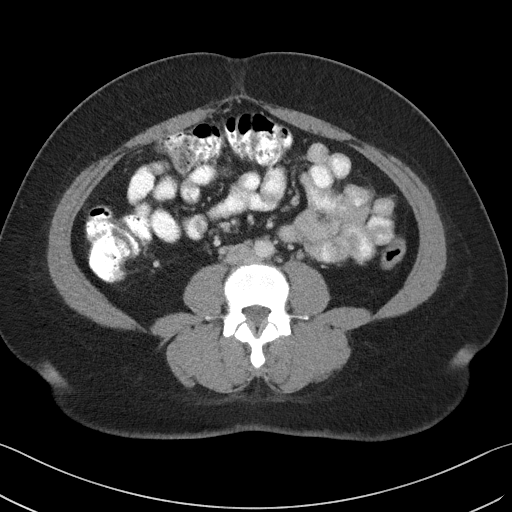
[im 58/98  soft-tissue]
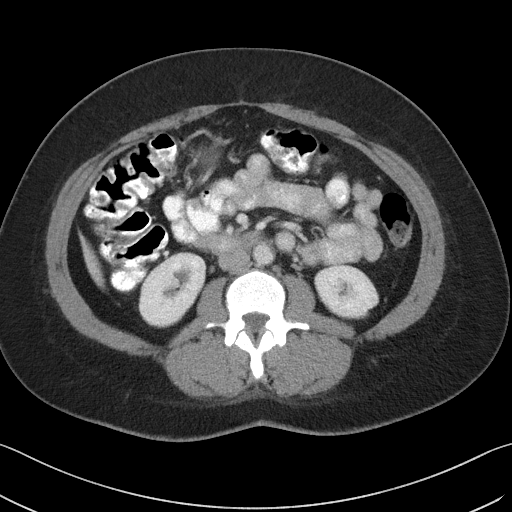
[im 63/98  soft-tissue]
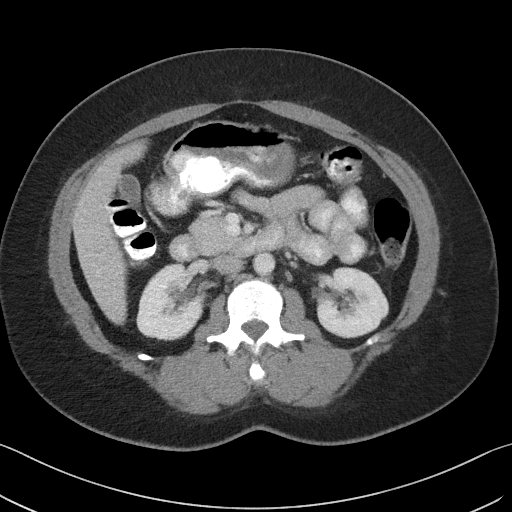
[im 63/98  bone]
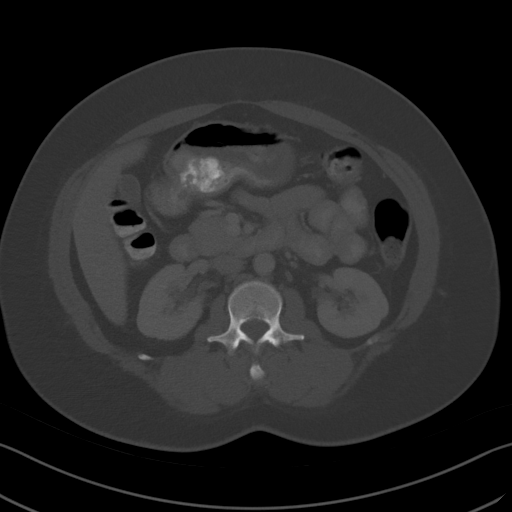
[im 69/98  soft-tissue]
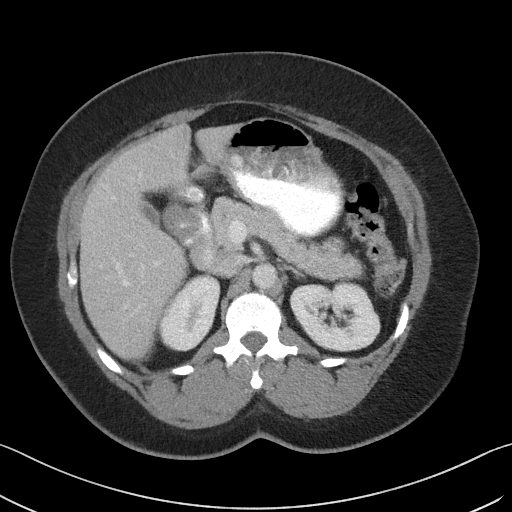
[im 75/98  soft-tissue]
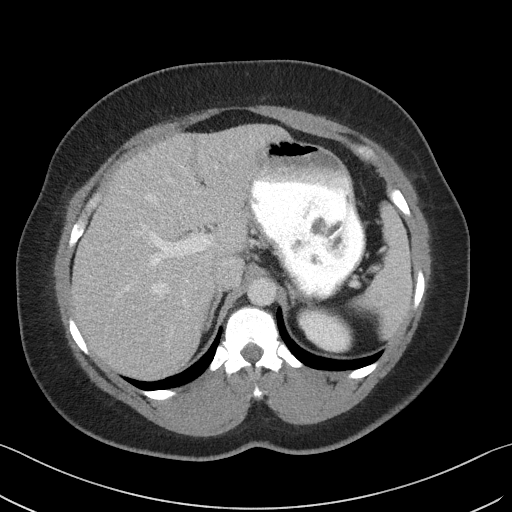
[im 86/98  soft-tissue]
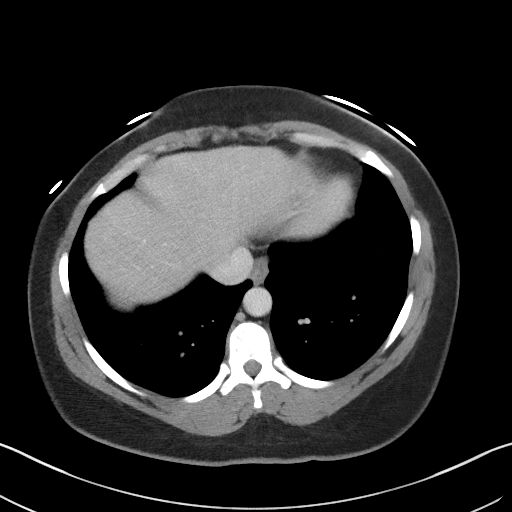
[im 92/98  soft-tissue]
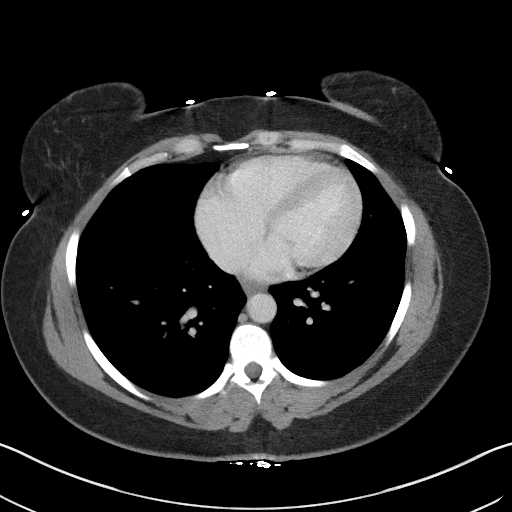

[Series 6: coronal st · coronal · 0.85mm/px · 3 of 116 slices shown]
[im 39/116  soft-tissue]
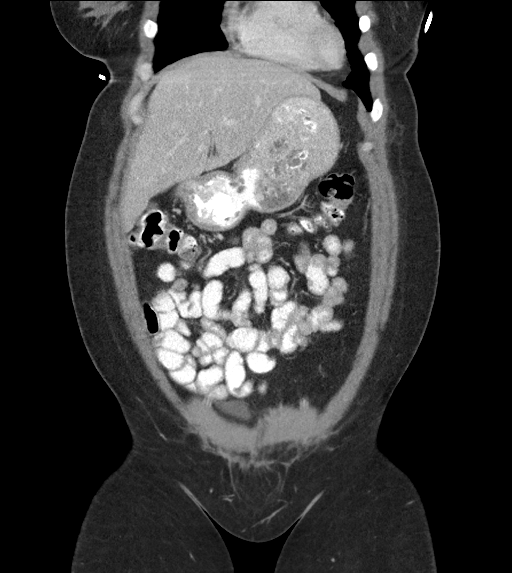
[im 52/116  soft-tissue]
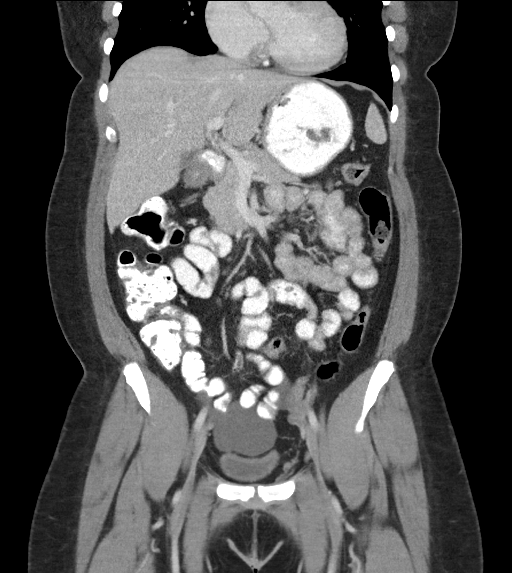
[im 64/116  soft-tissue]
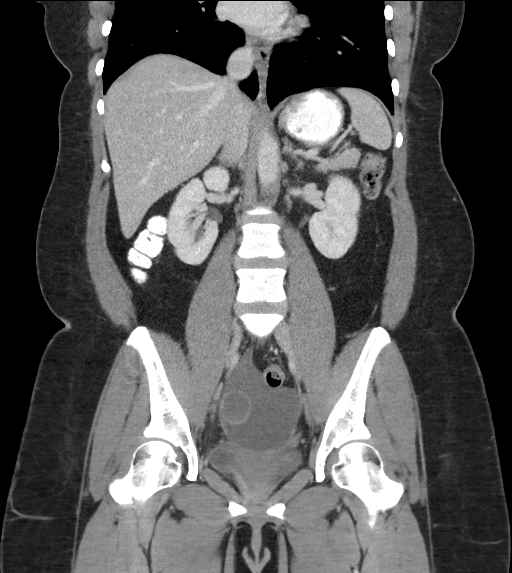

[16 of 46 positions shown; findings below may reference images not displayed]

FINDINGS: Lower chest: Lung bases are clear. No effusions. Heart is normal
size.

Hepatobiliary: No focal hepatic abnormality. Gallbladder
unremarkable.

Pancreas: No focal abnormality or ductal dilatation.

Spleen: No focal abnormality.  Normal size.

Adrenals/Urinary Tract: No adrenal abnormality. No focal renal
abnormality. No stones or hydronephrosis. Urinary bladder is
unremarkable.

Stomach/Bowel: Normal appendix. Stomach, large and small bowel
grossly unremarkable.

Vascular/Lymphatic: No evidence of aneurysm or adenopathy.

Reproductive: Prior hysterectomy. Complex fluid collection in the
pelvis. Centrally, thick walled cystic area is noted in the right
side of the fluid collection measuring 3.1 x 2.9 cm. This could be
within the right ovary. The large surrounding fluid collection
appears to be loculated ascites rather than true walled-off cystic
mass. The fluid collection measures 15 x 7.4 cm.

Other: No free air.

Musculoskeletal: No acute bony abnormality.
IMPRESSION: Large fluid collection within the pelvis which appears to represent
loculated ascites rather than a large cystic mass. Centrally, along
the right side of this fluid collection is a focal cystic area
measuring 3.1 x 2.9 cm, likely within the right ovary. Ruptured cyst
or cystic ovarian neoplasm cannot be excluded. Recommend continued
follow-up with repeat CT in 3 months.

## 2018-12-11 ENCOUNTER — Ambulatory Visit (INDEPENDENT_AMBULATORY_CARE_PROVIDER_SITE_OTHER): Payer: Medicare Other | Admitting: Orthopaedic Surgery

## 2018-12-11 ENCOUNTER — Encounter: Payer: Self-pay | Admitting: Orthopaedic Surgery

## 2018-12-11 ENCOUNTER — Other Ambulatory Visit: Payer: Self-pay

## 2018-12-11 VITALS — Temp 98.4°F

## 2018-12-11 DIAGNOSIS — G8918 Other acute postprocedural pain: Secondary | ICD-10-CM

## 2018-12-11 DIAGNOSIS — M65341 Trigger finger, right ring finger: Secondary | ICD-10-CM

## 2018-12-11 NOTE — Progress Notes (Signed)
CC:  My hand is more tender  She had trigger finger release on 11-18-2018 by Dr. Aline Brochure.  She had been doing well but has some swelling and tenderness of the finger ring on the right.  She has no redness.  She has had slight discharge but not today.  ROM of the ring finger on the right is very tender but has full motion of the other fingers.  She has no redness, no swelling.  NV intact.  Encounter Diagnoses  Name Primary?  . Trigger finger, right ring finger Yes  . Post-operative pain    I have advised putting her hand in warm water and using a small sponge.  I have recommended getting some PlayDough and using it.  She is to see Dr. Aline Brochure next Wednesday, earlier if worse.  Call if any problem.  Precautions discussed.   Electronically Signed Sanjuana Kava, MD 10/8/202012:12 PM

## 2018-12-19 ENCOUNTER — Other Ambulatory Visit: Payer: Self-pay | Admitting: Family Medicine

## 2018-12-19 ENCOUNTER — Ambulatory Visit (INDEPENDENT_AMBULATORY_CARE_PROVIDER_SITE_OTHER): Payer: Medicare Other | Admitting: Orthopedic Surgery

## 2018-12-19 ENCOUNTER — Encounter: Payer: Self-pay | Admitting: Orthopedic Surgery

## 2018-12-19 ENCOUNTER — Other Ambulatory Visit: Payer: Self-pay

## 2018-12-19 VITALS — BP 132/92 | Ht 63.0 in | Wt 174.0 lb

## 2018-12-19 DIAGNOSIS — Z9889 Other specified postprocedural states: Secondary | ICD-10-CM

## 2018-12-19 DIAGNOSIS — L24A9 Irritant contact dermatitis due friction or contact with other specified body fluids: Secondary | ICD-10-CM

## 2018-12-19 DIAGNOSIS — T148XXA Other injury of unspecified body region, initial encounter: Secondary | ICD-10-CM

## 2018-12-19 DIAGNOSIS — G43009 Migraine without aura, not intractable, without status migrainosus: Secondary | ICD-10-CM

## 2018-12-19 MED ORDER — DOXYCYCLINE HYCLATE 100 MG PO TABS: 100.0000 mg | ORAL_TABLET | Freq: Two times a day (BID) | ORAL | 1 refills | Status: DC

## 2018-12-19 NOTE — Patient Instructions (Signed)
Aloe vera plant okay

## 2018-12-19 NOTE — Progress Notes (Signed)
Chief Complaint  Patient presents with  . Routine Post Op    trigger finger release 11/18/18 has pain swelling still has used aloe with some relief     Status post right ring finger trigger release 11/18/2018 reported drainage came in on the eighth was advised to use warm water and soaks active range of motion exercises she also tried some aloe treatment which she says helped with the drainage but she still has tenderness.  He can bend the finger but not completely and presents now for evaluation and treatment  She has a lot of tenderness at the scar and just distal to it up to the MTP joint no surrounding erythema there peers to be a lot of induration there  Recommend oral antibiotics for 2 weeks and then recheck.  She can also continue to use aloe vera  Meds ordered this encounter  Medications  . doxycycline (VIBRA-TABS) 100 MG tablet    Sig: Take 1 tablet (100 mg total) by mouth 2 (two) times daily.    Dispense:  28 tablet    Refill:  1

## 2018-12-22 ENCOUNTER — Telehealth: Payer: Self-pay

## 2018-12-22 ENCOUNTER — Other Ambulatory Visit: Payer: Self-pay | Admitting: Orthopedic Surgery

## 2018-12-22 MED ORDER — SULFAMETHOXAZOLE-TRIMETHOPRIM 400-80 MG PO TABS: 1.0000 | ORAL_TABLET | Freq: Two times a day (BID) | ORAL | 0 refills | Status: DC

## 2018-12-22 NOTE — Telephone Encounter (Signed)
I called patient she d/c antibiotics due to nausea only took 2 /  She wants you to change to something else

## 2018-12-22 NOTE — Telephone Encounter (Signed)
Patient states she is having serious side effects with the antibiotics that she was prescribed. She is asking for an return call to discuss this.

## 2018-12-22 NOTE — Telephone Encounter (Signed)
Ok bactrim

## 2018-12-22 NOTE — Progress Notes (Signed)
Meds ordered this encounter  Medications  . sulfamethoxazole-trimethoprim (BACTRIM) 400-80 MG tablet    Sig: Take 1 tablet by mouth 2 (two) times daily.    Dispense:  28 tablet    Refill:  0    Change due to nausea

## 2018-12-24 DIAGNOSIS — H40003 Preglaucoma, unspecified, bilateral: Secondary | ICD-10-CM | POA: Diagnosis not present

## 2019-01-02 ENCOUNTER — Encounter: Payer: Self-pay | Admitting: Orthopedic Surgery

## 2019-01-02 ENCOUNTER — Ambulatory Visit (INDEPENDENT_AMBULATORY_CARE_PROVIDER_SITE_OTHER): Payer: Medicare Other | Admitting: Orthopedic Surgery

## 2019-01-02 ENCOUNTER — Other Ambulatory Visit: Payer: Self-pay

## 2019-01-02 DIAGNOSIS — Z9889 Other specified postprocedural states: Secondary | ICD-10-CM

## 2019-01-02 DIAGNOSIS — L24A9 Irritant contact dermatitis due friction or contact with other specified body fluids: Secondary | ICD-10-CM

## 2019-01-02 DIAGNOSIS — T148XXA Other injury of unspecified body region, initial encounter: Secondary | ICD-10-CM

## 2019-01-02 MED ORDER — DOXYCYCLINE HYCLATE 100 MG PO TABS: 100.0000 mg | ORAL_TABLET | Freq: Two times a day (BID) | ORAL | 1 refills | Status: DC

## 2019-01-02 NOTE — Progress Notes (Signed)
Chief Complaint  Patient presents with  . Routine Post Op    11/18/18 right  trigger finger release    Trigger finger release right groin on doxycycline improving swelling is gone down still little soreness no catching or locking recommend continue doxycycline follow-up in 2 weeks

## 2019-01-16 ENCOUNTER — Ambulatory Visit (INDEPENDENT_AMBULATORY_CARE_PROVIDER_SITE_OTHER): Payer: Medicare Other | Admitting: Orthopedic Surgery

## 2019-01-16 ENCOUNTER — Other Ambulatory Visit: Payer: Self-pay

## 2019-01-16 ENCOUNTER — Encounter: Payer: Self-pay | Admitting: Orthopedic Surgery

## 2019-01-16 DIAGNOSIS — Z9889 Other specified postprocedural states: Secondary | ICD-10-CM

## 2019-01-16 NOTE — Progress Notes (Signed)
Chief Complaint  Patient presents with  . Routine Post Op    right trigger finger release/ feels better    Surgery date 11/18/2018  Stacy Wolf had a right ring finger trigger release developed an infection was treated with doxycycline she is now almost asymptomatic except for some sensitivity over the wound for which she is using the toothbrush  Today she has no signs of infection full range of motion with no catching  I released

## 2019-01-20 ENCOUNTER — Encounter: Payer: Self-pay | Admitting: Adult Health

## 2019-01-20 ENCOUNTER — Other Ambulatory Visit (HOSPITAL_COMMUNITY)
Admission: RE | Admit: 2019-01-20 | Discharge: 2019-01-20 | Disposition: A | Discharge status: Home / Self Care | Payer: Medicare Other | Source: Ambulatory Visit | Attending: Adult Health | Admitting: Adult Health

## 2019-01-20 ENCOUNTER — Other Ambulatory Visit: Payer: Self-pay

## 2019-01-20 ENCOUNTER — Ambulatory Visit (INDEPENDENT_AMBULATORY_CARE_PROVIDER_SITE_OTHER): Payer: Medicare Other | Admitting: Adult Health

## 2019-01-20 VITALS — BP 131/93 | HR 67 | Ht 63.0 in | Wt 180.0 lb

## 2019-01-20 DIAGNOSIS — Z124 Encounter for screening for malignant neoplasm of cervix: Secondary | ICD-10-CM | POA: Insufficient documentation

## 2019-01-20 DIAGNOSIS — Z79899 Other long term (current) drug therapy: Secondary | ICD-10-CM

## 2019-01-20 DIAGNOSIS — Z1151 Encounter for screening for human papillomavirus (HPV): Secondary | ICD-10-CM | POA: Diagnosis not present

## 2019-01-20 DIAGNOSIS — Z01419 Encounter for gynecological examination (general) (routine) without abnormal findings: Secondary | ICD-10-CM | POA: Diagnosis not present

## 2019-01-20 DIAGNOSIS — K5900 Constipation, unspecified: Secondary | ICD-10-CM

## 2019-01-20 DIAGNOSIS — R309 Painful micturition, unspecified: Secondary | ICD-10-CM | POA: Diagnosis not present

## 2019-01-20 DIAGNOSIS — Z0000 Encounter for general adult medical examination without abnormal findings: Secondary | ICD-10-CM

## 2019-01-20 DIAGNOSIS — K668 Other specified disorders of peritoneum: Secondary | ICD-10-CM

## 2019-01-20 DIAGNOSIS — Z90710 Acquired absence of both cervix and uterus: Secondary | ICD-10-CM | POA: Insufficient documentation

## 2019-01-20 DIAGNOSIS — Z1211 Encounter for screening for malignant neoplasm of colon: Secondary | ICD-10-CM | POA: Insufficient documentation

## 2019-01-20 LAB — POCT URINALYSIS DIPSTICK
Glucose, UA: NEGATIVE
Ketones, UA: NEGATIVE
Leukocytes, UA: NEGATIVE
Nitrite, UA: NEGATIVE
Protein, UA: NEGATIVE

## 2019-01-20 MED ORDER — ESTRADIOL 2 MG PO TABS: 2.0000 mg | ORAL_TABLET | Freq: Every day | ORAL | 4 refills | Status: DC

## 2019-01-20 MED ORDER — NITROFURANTOIN MONOHYD MACRO 100 MG PO CAPS: 100.0000 mg | ORAL_CAPSULE | Freq: Two times a day (BID) | ORAL | 0 refills | Status: DC

## 2019-01-20 MED ORDER — LUBIPROSTONE 24 MCG PO CAPS: 24.0000 ug | ORAL_CAPSULE | Freq: Two times a day (BID) | ORAL | 3 refills | Status: DC

## 2019-01-20 NOTE — Progress Notes (Signed)
Patient ID: Stacy Wolf, female   DOB: 1975/04/10, 43 y.o.   MRN: [id number] History of Present Illness: Brunilda Payor is a Hispanic female, divorced, sp [facility] in for a well woman gyn exam and pap.She is peeing small amounts she says.  PCP is Dr Warrick Parisian.    Current Medications, Allergies, Past Medical History, Past Surgical History, Family History and Social History were reviewed in [employer] record.     Review of Systems: Patient denies any headaches, hearing loss, fatigue, blurred vision, shortness of breath, chest pain, abdominal pain, problems with  intercourse. No joint pain or mood swings. Has BM once a month unless drinks milk Has burning with urination and peeing small amounts for about a month and low back pain Has history of peritoneal inclusion cyst    Physical Exam:BP (!) 131/93 (BP Location: Left Arm, Patient Position: Sitting, Cuff Size: Normal)   Pulse 67   Ht 5\' 3"  (1.6 m)   Wt 180 lb (81.6 kg)   LMP 08/10/2016 Comment: [place]  BMI 31.89 kg/m   Urine dipstick trace blood. General:  Well developed, well nourished, no acute distress Skin:  Warm and dry Neck:  Midline trachea, normal thyroid, good ROM, no lymphadenopathy Lungs; Clear to auscultation bilaterally No CVAT Breast:  No dominant palpable mass, retraction, or nipple discharge Cardiovascular: Regular rate and rhythm Abdomen:  Soft, non tender, no hepatosplenomegaly Pelvic:  External genitalia is normal in appearance, no lesions.  The vagina is normal in appearance. Urethra has no lesions or masses. The cervix is bulbous. Pap with high risk HPV, 16/18 genotyping performed with GC/CHL.  Uterus is absent.  No adnexal masses or tenderness noted.Bladder is non tender, no masses felt. Rectal: Good sphincter tone, no polyps, or hemorrhoids felt.  Hemoccult negative. Extremities/musculoskeletal:  No swelling or varicosities noted, no clubbing or cyanosis Psych:  No mood changes, alert and  cooperative,seems happy Fall risk is low PHQ 2 score is 0. Examination chaperoned by Levy Pupa LPN   Impression and Plan:  1. Routine cervical smear  2. Routine medical exam   3. Pain with urination Will rx Macrobid, not enough urine for C&S  4. Encounter for gynecological examination with Papanicolaou smear of cervix Pap sent Physical in 1 year Pap in 3 if normal Mammogram yearly   5. Screening for colorectal cancer Wants to see GI, talk with PCP for referral due to insurance   6. Peritoneal inclusion cyst Return in 3 months with Dr Elonda Husky for follow up   7. Current use of estrogen therapy Will refill estrace 2 mg  Meds ordered this encounter  Medications  . estradiol (ESTRACE) 2 MG tablet    Sig: Take 1 tablet (2 mg total) by mouth at bedtime.    Dispense:  90 tablet    Refill:  4    Order Specific Question:   Supervising Provider    Answer:   Elonda Husky, LUTHER H [[id number]]  . lubiprostone (AMITIZA) 24 MCG capsule    Sig: Take 1 capsule (24 mcg total) by mouth 2 (two) times daily with a meal.    Dispense:  60 capsule    Refill:  3    Order Specific Question:   Supervising Provider    Answer:   Elonda Husky, LUTHER H [[id number]]  . nitrofurantoin, macrocrystal-monohydrate, (MACROBID) 100 MG capsule    Sig: Take 1 capsule (100 mg total) by mouth 2 (two) times daily.    Dispense:  14 capsule    Refill:  0  Order Specific Question:   Supervising Provider    Answer:   Tania Ade H [[id number]]    8. Constipation, unspecified constipation type Will rx amitiza See GI

## 2019-01-22 LAB — CYTOLOGY - PAP
Chlamydia: NEGATIVE
Comment: NEGATIVE
Comment: NEGATIVE
Comment: NORMAL
Diagnosis: NEGATIVE
High risk HPV: NEGATIVE
Neisseria Gonorrhea: NEGATIVE

## 2019-01-27 ENCOUNTER — Other Ambulatory Visit: Payer: Self-pay

## 2019-01-28 ENCOUNTER — Encounter: Payer: Self-pay | Admitting: Family Medicine

## 2019-01-28 ENCOUNTER — Ambulatory Visit (INDEPENDENT_AMBULATORY_CARE_PROVIDER_SITE_OTHER): Payer: Medicare Other | Admitting: Family Medicine

## 2019-01-28 VITALS — BP 129/79 | HR 70 | Temp 96.0°F | Ht 63.0 in | Wt 180.2 lb

## 2019-01-28 DIAGNOSIS — G43009 Migraine without aura, not intractable, without status migrainosus: Secondary | ICD-10-CM | POA: Diagnosis not present

## 2019-01-28 DIAGNOSIS — I10 Essential (primary) hypertension: Secondary | ICD-10-CM

## 2019-01-28 DIAGNOSIS — E785 Hyperlipidemia, unspecified: Secondary | ICD-10-CM | POA: Diagnosis not present

## 2019-01-28 DIAGNOSIS — E669 Obesity, unspecified: Secondary | ICD-10-CM

## 2019-01-28 DIAGNOSIS — K219 Gastro-esophageal reflux disease without esophagitis: Secondary | ICD-10-CM

## 2019-01-28 MED ORDER — PROPRANOLOL HCL ER 80 MG PO CP24: 80.0000 mg | ORAL_CAPSULE | Freq: Every day | ORAL | 3 refills | Status: DC

## 2019-01-28 NOTE — Progress Notes (Signed)
BP 129/79   Pulse 70   Temp (!) 96 F (35.6 C) (Temporal)   Ht '5\' 3"'  (1.6 m)   Wt 180 lb 3.2 oz (81.7 kg)   LMP 08/10/2016 Comment: [place]  SpO2 100%   BMI 31.92 kg/m    Subjective:   Patient ID: Stacy Wolf, female    DOB: 14-Aug-1975, 43 y.o.   MRN: [id number]  HPI: Stacy Wolf is a 43 y.o. female presenting on 01/28/2019 for Hypertension   HPI Hypertension Patient is currently on no medication, and their blood pressure today is 129/79. Patient denies any lightheadedness or dizziness. Patient denies blurred vision, chest pains, shortness of breath, or weakness. Denies any side effects from medication and is content with current medication.  Patient does feel like at home her blood pressures been running up and she has been getting blood pressures in the 127/93 and 147/97 at home and that is when she has her headaches and they have been coming more frequently and so she wanted to discuss doing something for the blood pressure.  She does have quite a history of migraines and is on Topamax but feels like her headaches are more frequent  Hyperlipidemia Patient is coming in for recheck of his hyperlipidemia. The patient is currently taking no medication currently and diet control. They deny any issues with myalgias or history of liver damage from it. They deny any focal numbness or weakness or chest pain.   GERD Patient is currently on no medication currently and does not feel like she is having acid reflux although she did start on a medication called Amitiza for constipation and it has been working greatly..  She denies any major symptoms or abdominal pain or belching or burping. She denies any blood in her stool or lightheadedness or dizziness.   Relevant past medical, surgical, family and social history reviewed and updated as indicated. Interim medical history since our last visit reviewed. Allergies and medications reviewed and updated.  Review of Systems  Constitutional:  Negative for chills and fever.  Eyes: Negative for redness and visual disturbance.  Respiratory: Negative for chest tightness and shortness of breath.   Cardiovascular: Negative for chest pain and leg swelling.  Musculoskeletal: Negative for back pain and gait problem.  Skin: Negative for rash.  Neurological: Positive for headaches. Negative for dizziness and light-headedness.  Psychiatric/Behavioral: Negative for agitation and behavioral problems.  All other systems reviewed and are negative.   Per HPI unless specifically indicated above   Allergies as of 01/28/2019      Reactions   Imitrex [sumatriptan] Anaphylaxis, Other (See Comments)   Could not swallow after taking med   Latuda [lurasidone Hcl] Other (See Comments)   Suicidal thoughts   Wellbutrin [bupropion] Other (See Comments)   Seizures / lowered seizure threshold   Zoloft [sertraline Hcl] Hives   Haloperidol And Related Other (See Comments)   Leg cramps   Nicoderm [nicotine] Dermatitis      Medication List       Accurate as of January 28, 2019  2:57 PM. If you have any questions, ask your nurse or doctor.        STOP taking these medications   nitrofurantoin (macrocrystal-monohydrate) 100 MG capsule Commonly known as: MACROBID Stopped by: Worthy Rancher, MD     TAKE these medications   aspirin-acetaminophen-caffeine [phone] MG tablet Commonly known as: EXCEDRIN MIGRAINE Take 2 tablets by mouth every 6 (six) hours as needed for headache or migraine.  estradiol 2 MG tablet Commonly known as: ESTRACE Take 1 tablet (2 mg total) by mouth at bedtime.   lubiprostone 24 MCG capsule Commonly known as: AMITIZA Take 1 capsule (24 mcg total) by mouth 2 (two) times daily with a meal.   propranolol ER 80 MG 24 hr capsule Commonly known as: Inderal LA Take 1 capsule (80 mg total) by mouth daily. Started by: Worthy Rancher, MD   topiramate 50 MG tablet Commonly known as: TOPAMAX Take 1 tablet (50  mg total) by mouth at bedtime. (Needs to be seen before next refill)        Objective:   BP 129/79   Pulse 70   Temp (!) 96 F (35.6 C) (Temporal)   Ht '5\' 3"'  (1.6 m)   Wt 180 lb 3.2 oz (81.7 kg)   LMP 08/10/2016 Comment: [place]  SpO2 100%   BMI 31.92 kg/m   Wt Readings from Last 3 Encounters:  01/28/19 180 lb 3.2 oz (81.7 kg)  01/20/19 180 lb (81.6 kg)  12/19/18 174 lb (78.9 kg)    Physical Exam Vitals signs and nursing note reviewed.  Constitutional:      General: She is not in acute distress.    Appearance: She is well-developed. She is not diaphoretic.  Eyes:     Conjunctiva/sclera: Conjunctivae normal.  Cardiovascular:     Rate and Rhythm: Normal rate and regular rhythm.     Heart sounds: Normal heart sounds. No murmur.  Pulmonary:     Effort: Pulmonary effort is normal. No respiratory distress.     Breath sounds: Normal breath sounds. No wheezing.  Musculoskeletal: Normal range of motion.        General: No tenderness.  Skin:    General: Skin is warm and dry.     Findings: No rash.  Neurological:     Mental Status: She is alert and oriented to person, place, and time.     Coordination: Coordination normal.  Psychiatric:        Behavior: Behavior normal.       Assessment & Plan:   Problem List Items Addressed This Visit      Cardiovascular and Mediastinum   Migraine headache   Relevant Medications   propranolol ER (INDERAL LA) 80 MG 24 hr capsule   Other Relevant Orders   TSH   Essential hypertension, benign   Relevant Medications   propranolol ER (INDERAL LA) 80 MG 24 hr capsule   Other Relevant Orders   CBC with Differential/Platelet   CMP14+EGFR   TSH     Digestive   Gastroesophageal reflux disease without esophagitis   Relevant Orders   CBC with Differential/Platelet   TSH     Other   Hyperlipidemia LDL goal <100 - Primary   Relevant Medications   propranolol ER (INDERAL LA) 80 MG 24 hr capsule   Other Relevant Orders   Lipid panel    TSH   Obesity (BMI 30.0-34.9)   Relevant Orders   TSH      Will start propranolol for possible headaches and possible blood pressure and see if it helps, recommended to reduce Excedrin use and only use it very infrequently. Follow up plan: Return in about 3 months (around 04/30/2019), or if symptoms worsen or fail to improve, for Recheck headaches and blood pressure.  Counseling provided for all of the vaccine components Orders Placed This Encounter  Procedures  . CBC with Differential/Platelet  . CMP14+EGFR  . Lipid panel  . TSH  Caryl Pina, MD [facility] 01/28/2019, 2:57 PM

## 2019-01-29 LAB — CBC WITH DIFFERENTIAL/PLATELET
Basophils Absolute: 0.1 10*3/uL (ref 0.0–0.2)
Basos: 1 %
EOS (ABSOLUTE): 0.2 10*3/uL (ref 0.0–0.4)
Eos: 2 %
Hematocrit: 40.1 % (ref 34.0–46.6)
Hemoglobin: 13.2 g/dL (ref 11.1–15.9)
Immature Grans (Abs): 0 10*3/uL (ref 0.0–0.1)
Immature Granulocytes: 0 %
Lymphocytes Absolute: 3.4 10*3/uL — ABNORMAL HIGH (ref 0.7–3.1)
Lymphs: 42 %
MCH: 31.6 pg (ref 26.6–33.0)
MCHC: 32.9 g/dL (ref 31.5–35.7)
MCV: 96 fL (ref 79–97)
Monocytes Absolute: 0.4 10*3/uL (ref 0.1–0.9)
Monocytes: 5 %
Neutrophils Absolute: 4 10*3/uL (ref 1.4–7.0)
Neutrophils: 50 %
Platelets: 270 10*3/uL (ref 150–450)
RBC: 4.18 x10E6/uL (ref 3.77–5.28)
RDW: 12.6 % (ref 11.7–15.4)
WBC: 8.1 10*3/uL (ref 3.4–10.8)

## 2019-01-29 LAB — LIPID PANEL
Chol/HDL Ratio: 4 ratio (ref 0.0–4.4)
Cholesterol, Total: 201 mg/dL — ABNORMAL HIGH (ref 100–199)
HDL: 50 mg/dL (ref >39)
LDL Chol Calc (NIH): 115 mg/dL — ABNORMAL HIGH (ref 0–99)
Triglycerides: 204 mg/dL — ABNORMAL HIGH (ref 0–149)
VLDL Cholesterol Cal: 36 mg/dL (ref 5–40)

## 2019-01-29 LAB — CMP14+EGFR
ALT: 9 IU/L (ref 0–32)
AST: 11 IU/L (ref 0–40)
Albumin/Globulin Ratio: 1.8 (ref 1.2–2.2)
Albumin: 4.5 g/dL (ref 3.8–4.8)
Alkaline Phosphatase: 103 IU/L (ref 39–117)
BUN/Creatinine Ratio: 15 (ref 9–23)
BUN: 13 mg/dL (ref 6–24)
Bilirubin Total: 0.2 mg/dL (ref 0.0–1.2)
CO2: 23 mmol/L (ref 20–29)
Calcium: 9.5 mg/dL (ref 8.7–10.2)
Chloride: 107 mmol/L — ABNORMAL HIGH (ref 96–106)
Creatinine, Ser: 0.86 mg/dL (ref 0.57–1.00)
GFR calc Af Amer: 95 mL/min/{1.73_m2} (ref >59)
GFR calc non Af Amer: 82 mL/min/{1.73_m2} (ref >59)
Globulin, Total: 2.5 g/dL (ref 1.5–4.5)
Glucose: 96 mg/dL (ref 65–99)
Potassium: 4.1 mmol/L (ref 3.5–5.2)
Sodium: 143 mmol/L (ref 134–144)
Total Protein: 7 g/dL (ref 6.0–8.5)

## 2019-01-29 LAB — TSH: TSH: 1.04 u[IU]/mL (ref 0.450–4.500)

## 2019-03-07 ENCOUNTER — Other Ambulatory Visit: Payer: Self-pay | Admitting: Family Medicine

## 2019-03-07 DIAGNOSIS — G43009 Migraine without aura, not intractable, without status migrainosus: Secondary | ICD-10-CM

## 2019-03-16 ENCOUNTER — Other Ambulatory Visit: Payer: Self-pay | Admitting: Family Medicine

## 2019-03-16 DIAGNOSIS — G43009 Migraine without aura, not intractable, without status migrainosus: Secondary | ICD-10-CM

## 2019-04-13 ENCOUNTER — Ambulatory Visit (INDEPENDENT_AMBULATORY_CARE_PROVIDER_SITE_OTHER): Payer: Medicaid Other | Admitting: Orthopedic Surgery

## 2019-04-13 ENCOUNTER — Other Ambulatory Visit: Payer: Self-pay

## 2019-04-13 ENCOUNTER — Encounter: Payer: Self-pay | Admitting: Orthopedic Surgery

## 2019-04-13 VITALS — BP 140/85 | HR 74 | Ht 63.0 in | Wt 175.0 lb

## 2019-04-13 DIAGNOSIS — M7989 Other specified soft tissue disorders: Secondary | ICD-10-CM | POA: Diagnosis not present

## 2019-04-13 NOTE — Progress Notes (Addendum)
Chief Complaint  Patient presents with  . 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  Takes 2 days of not working for the hands to go down she says she has not worked in several years  She has had to carpal tunnel releases 1 on each side seems to be doing well related to that  She has no family history of rheumatoid arthritis  Past Medical History:  Diagnosis Date  . Anxiety   . Bipolar 1 disorder ([place])   . Heart murmur   . Migraines   . Schizophrenia ([place])    No orders of the defined types were placed in this encounter.  Family History  Problem Relation Age of Onset  . Cancer Mother 68       breast CA at 44 and uterine CA at 83  . Hypertension Mother   . Kidney disease Mother        kidney transplant  . Cancer Sister 40       uterine  . Cancer Sister 58       uterine  . Diabetes Father   . Liver disease Father   . Cancer Maternal Aunt        breast    BP 140/85   Pulse 74   Ht 5\' 3"  (1.6 m)   Wt 175 lb (79.4 kg)   LMP 08/10/2016 Comment: [place]  BMI 31.00 kg/m   Awake and alert oriented x3 mood affect normal appearance body habitus normal  Both hands today show very little swelling full range of motion carpal tunnel incisions healed neurovascular exam is intact she has good grip strength and no warmth to the joints  An x-ray taken of her right hand shows no periarticular erosions  Impression bilateral edema in the hands  Recommend elevation and edema gloves through occupational therapy orders written  Acute uncomplicated illness injury with physical therapy orders

## 2019-04-20 ENCOUNTER — Telehealth: Payer: Self-pay | Admitting: Family Medicine

## 2019-04-22 ENCOUNTER — Encounter (HOSPITAL_COMMUNITY): Payer: Self-pay

## 2019-04-22 ENCOUNTER — Ambulatory Visit (HOSPITAL_COMMUNITY): Payer: Medicaid Other | Attending: Orthopedic Surgery

## 2019-04-22 ENCOUNTER — Other Ambulatory Visit: Payer: Self-pay

## 2019-04-22 DIAGNOSIS — M25641 Stiffness of right hand, not elsewhere classified: Secondary | ICD-10-CM

## 2019-04-22 DIAGNOSIS — M25642 Stiffness of left hand, not elsewhere classified: Secondary | ICD-10-CM

## 2019-04-22 DIAGNOSIS — R29898 Other symptoms and signs involving the musculoskeletal system: Secondary | ICD-10-CM

## 2019-04-22 NOTE — Therapy (Signed)
[place], [place], [address] Phone: [phone]   Fax:  [phone]  Occupational Therapy Evaluation  Patient Details  Name: Stacy Wolf MRN: [id number] Date of Birth: 06-17-1975 Referring Provider (OT): Dr. Adonis Huguenin   Encounter Date: 04/22/2019  OT End of Session - 04/22/19 1246    Visit Number  1    Number of Visits  1    Date for OT Re-Evaluation  05/13/19    Authorization Type  Medicaid    Authorization Time Period  No visit requested as patient will pick up gloves once they are delivered and will select which one she prefers.    OT Start Time  1030    OT Stop Time  1056    OT Time Calculation (min)  26 min    Activity Tolerance  Patient tolerated treatment well    Behavior During Therapy  WFL for tasks assessed/performed       Past Medical History:  Diagnosis Date  . Anxiety   . Bipolar 1 disorder ([place])   . Heart murmur   . Migraines   . Schizophrenia [facility])     Past Surgical History:  Procedure Laterality Date  . arm surgery Right    tendonitis-wrist  . BILATERAL SALPINGECTOMY Bilateral 08/29/2016   Procedure: BILATERAL SALPINGECTOMY;  Surgeon: Florian Buff, MD;  Location: [facility];  Service: Gynecology;  Laterality: Bilateral;  . CARPAL TUNNEL RELEASE Left 04/11/2016   Procedure: CARPAL TUNNEL RELEASE;  Surgeon: [name], MD;  Location: [facility];  Service: Orthopedics;  Laterality: Left;  . CARPAL TUNNEL RELEASE Right 03/13/2018   Procedure: RIGHT CARPAL TUNNEL RELEASE;  Surgeon: [name], MD;  Location: [facility];  Service: Orthopedics;  Laterality: Right;  . SUPRACERVICAL ABDOMINAL HYSTERECTOMY N/A 08/29/2016   Procedure: HYSTERECTOMY SUPRACERVICAL ABDOMINAL;  Surgeon: Florian Buff, MD;  Location: [facility];  Service: Gynecology;  Laterality: N/A;  . TRIGGER FINGER RELEASE Right 11/18/2018   Procedure: RELEASE TRIGGER FINGER/A-1 PULLEY right ring;  Surgeon: [name], MD;  Location: [facility];  Service: Orthopedics;  Laterality: Right;  . TUBAL LIGATION      There were no vitals filed for this visit.  Subjective Assessment - 04/22/19 1239    Subjective   S: I only have pain when I try to make a tight fist.    Pertinent History  Patient is a 44 y/o female presenting to OT with complaints of bilateral hand swelling. She reports that she did not experience any swelling until after she had surgery for carpal tunnel release and began working her job last year. She reports that the swelling will only decrease if she has at least 3 days off of work. Dr. Aline Brochure has referred patient to occupational therapy for evaluation and fitting for compression gloves.    Patient Stated Goals  To decrease swelling.    Currently in Pain?  No/denies        [facility] OT Assessment - 04/22/19 1241      Assessment   Medical Diagnosis  Bilateral hand swelling    Referring Provider (OT)  Dr. Adonis Huguenin    Onset Date/Surgical Date  --   last year once she started her present job.    Hand Dominance  Right    Next MD Visit  --   None scheduled.    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       ADL   ADL comments  Difficulty achieving a full fist. Swelling does not decrease. Difficulty holding small items tightly without dropping.       Mobility   Mobility Status  Independent      Written Expression   Dominant Hand  Right      Vision - History   Baseline Vision  Wears glasses all the time      Cognition   Overall Cognitive Status  Within Functional Limits for  tasks assessed      Observation/Other Assessments   Focus on Therapeutic Outcomes (FOTO)   N/A      Sensation   Light Touch  Appears Intact    Stereognosis  Appears Intact    Hot/Cold  Appears Intact      Coordination   Fine Motor Movements are Fluid and Coordinated  No      Edema   Edema  Wrist: right and left 16 cm, MCP joint Right and left: 19 cm, Left thumb: 13cm, Right thumb: 13.5cm      ROM / Strength   AROM / PROM / Strength  AROM      AROM   Overall AROM   Deficits    Overall AROM Comments  Grossly assessed. patient unable to form a full fist with her right and left hand. Approximately demonstrated 75% of range. Functional A/ROM noted in bilateral hands for flexion and extension.                       OT Education - 04/22/19 1245    Education Details  Discussed use of compression gloves. Measurements taken to order black pair. Handout provided for Contrast Bath and Edema Massage.    Person(s) Educated  Patient    Methods  Explanation;Demonstration;Verbal cues;Handout    Comprehension  Verbalized understanding       OT Short Term Goals - 04/22/19 1253      OT SHORT TERM GOAL #1   Title  Patient will be educated and verbalize understanding of HEP and use of compression gloves to decrease edema in bilateral hands and allow her to complete required daily and work related tasks with less difficulty.    Time  3    Period  Weeks    Status  New    Target Date  05/06/19               Plan - 04/22/19 1247    Clinical Impression Statement  A: Patient is a 44 y/o female S/P Bilateral hand swelling causing decreased ROM and occassional pain while resulting in difficulty completing simple daily and work related tasks. No small edema gloves were in stock at clinic. OT completed measurements and recommended two different types: Rolyan and ExoSoft. Both will be ordered and patient will return to try them on and select one pair to use. In the mean time, she  was provided with a handout and educated on completing a contrast bath  and/or edema massage to help decrease the swelling.    OT Occupational Profile and History  Problem Focused Assessment - Including review of records relating to presenting problem    Body Structure / Function / Physical Skills  Edema    Rehab Potential  Excellent    Clinical Decision Making  Limited treatment options, no task modification necessary    Comorbidities Affecting Occupational Performance:  None    Modification or Assistance to Complete Evaluation   No modification of tasks or assist necessary to complete eval    OT Frequency  One time visit    OT Treatment/Interventions  Self-care/ADL training;Patient/family education    Plan  P: One time visit for evaluation of compression gloves. OT to order 2 different kinds of compression gloves. Once they are delivered patient will be scheduled to come in and try each one and pick one she would like to use. No additional treatment needed for OT.    Consulted and Agree with Plan of Care  Patient       Patient will benefit from skilled therapeutic intervention in order to improve the following deficits and impairments:   Body Structure / Function / Physical Skills: Edema       Visit Diagnosis: Other symptoms and signs involving the musculoskeletal system - Plan: Ot plan of care cert/re-cert  Stiffness of left hand, not elsewhere classified - Plan: Ot plan of care cert/re-cert  Stiffness of right hand, not elsewhere classified - Plan: Ot plan of care cert/re-cert    Problem List Patient Active Problem List   Diagnosis Date Noted  . Current use of estrogen therapy 01/20/2019  . Posttraumatic stress disorder 10/17/2016  . Personality disorder ([place]) 10/17/2016  . Menorrhagia with irregular cycle 08/13/2016  . Obesity (BMI 30.0-34.9) 04/23/2016  . Essential hypertension, benign 01/16/2016  . Migraine headache 04/18/2015  . Tobacco abuse 02/02/2015  . Constipation  02/02/2015  . Gastroesophageal reflux disease without esophagitis 08/24/2014  . Hyperlipidemia LDL goal <100 09/14/2013  . Bipolar disorder [facility]) 08/25/2013   Ailene Ravel, OTR/L,CBIS  [phone]  04/22/2019, 12:56 PM  [place] [address] [place], [place], [address] Phone: [phone]   Fax:  [phone]  Name: Stacy Wolf MRN: [id number] Date of Birth: 1976/02/20

## 2019-04-22 NOTE — Patient Instructions (Signed)
Other Strategies to decrease swelling  Contrast Bath  Prepare the baths.  Cold = 55-65 degrees     Hot = 105-110 degrees  Starting with the hot, dip hand or foot all the way into the water and hold there for selected duration.  Preferably 3 minutes.  After selected duration is up, dip hand or foot into the cold for 1/3 duration of the hot. (3 minutes hot, 1 minute cold)  Alternate back and forth for the times indicated for no more than a total of 20 minutes ending with hot.   Facts you should know Swelling: Prevention and Control  To help control the swelling, follow these suggestions:  1. When sitting or lying down, do not let your arm hang.  Keep your arm resting on a pillow.  Try to place your hand above the level of your heart as much as possible.  2. Perform the following massage techniques 3-5 times a day: 1. Elevate your arm on pillows above the level of your heart.  Make sure your hand is elevated higher than your elbow. 2. Put lotion on the had to be massaged 3. Using deep pressure, first massage fluid out of each finger and thumb. Beginning at the fingertips, move the fluid down into the hand.  Then massage the fluid out of the palm and back of the hand toward the wrist and elbow. 4. Continue to massage for about 5-10 minutes   Always massage from the fingertips toward the elbow and shoulder.  Never massage toward the fingers.

## 2019-04-23 ENCOUNTER — Ambulatory Visit: Payer: Medicaid Other | Admitting: Obstetrics & Gynecology

## 2019-05-01 ENCOUNTER — Other Ambulatory Visit: Payer: Self-pay

## 2019-05-01 ENCOUNTER — Ambulatory Visit (INDEPENDENT_AMBULATORY_CARE_PROVIDER_SITE_OTHER): Payer: Medicaid Other | Admitting: Obstetrics and Gynecology

## 2019-05-01 ENCOUNTER — Encounter: Payer: Self-pay | Admitting: Obstetrics and Gynecology

## 2019-05-01 VITALS — BP 131/87 | HR 56 | Ht 63.0 in | Wt 171.2 lb

## 2019-05-01 DIAGNOSIS — R102 Pelvic and perineal pain: Secondary | ICD-10-CM | POA: Diagnosis not present

## 2019-05-01 NOTE — Progress Notes (Signed)
Patient ID: [name], female   DOB: April 12, 1975, 44 y.o.   MRN: [id number]   [facility] Visit  @DATE @            Patient name: Stacy Wolf MRN [id number]  Date of birth: [birth year]  CC & HPI:  Stacy Wolf is a 44 y.o. female presenting today for cramping. She is s/p hysterectomy. She has cramping on her left lower side. She takes ibuprofen 800 mg to help alleviate the pain. She describes pain as "hammer and stabbing hammered" pain. She was given [place] which caused her to have a BM immediately. She believes the pain isn't caused due to her bowels.   ROS:  ROS +Lower left quadrant pain +vaginal cuff nodule in center of cuff that is tender to exam, and causes pain to radiate to the patient's left, described by the patient as similar to her chief complaint  Pertinent History Reviewed:   Reviewed: Significant for 2018 hysterectomy Medical         Past Medical History:  Diagnosis Date  . Anxiety   . Bipolar 1 disorder ([place])   . Heart murmur   . Migraines   . Schizophrenia [facility]                               Surgical Hx:    Past Surgical History:  Procedure Laterality Date  . arm surgery Right    tendonitis-wrist  . BILATERAL SALPINGECTOMY Bilateral 08/29/2016   Procedure: BILATERAL SALPINGECTOMY;  Surgeon: Florian Buff, MD;  Location: [facility];  Service: Gynecology;  Laterality: Bilateral;  . CARPAL TUNNEL RELEASE Left 04/11/2016   Procedure: CARPAL TUNNEL RELEASE;  Surgeon: [name], MD;  Location: [facility];  Service: Orthopedics;  Laterality: Left;  . CARPAL TUNNEL RELEASE Right 03/13/2018   Procedure: RIGHT CARPAL TUNNEL RELEASE;  Surgeon: [name], MD;  Location: [facility];  Service: Orthopedics;  Laterality: Right;  . SUPRACERVICAL ABDOMINAL HYSTERECTOMY N/A 08/29/2016   Procedure: HYSTERECTOMY SUPRACERVICAL ABDOMINAL;  Surgeon: Florian Buff, MD;  Location: [facility];  Service: Gynecology;  Laterality: N/A;  . TRIGGER FINGER RELEASE Right 11/18/2018    Procedure: RELEASE TRIGGER FINGER/A-1 PULLEY right ring;  Surgeon: [name], MD;  Location: [facility];  Service: Orthopedics;  Laterality: Right;  . TUBAL LIGATION     Medications: Reviewed & Updated - see associated section                       Current Outpatient Medications:  .  aspirin-acetaminophen-caffeine (EXCEDRIN MIGRAINE) 250-250-65 MG tablet, Take 2 tablets by mouth every 6 (six) hours as needed for headache or migraine. , Disp: , Rfl:  .  estradiol (ESTRACE) 2 MG tablet, Take 1 tablet (2 mg total) by mouth at bedtime., Disp: 90 tablet, Rfl: 4 .  lubiprostone (AMITIZA) 24 MCG capsule, Take 1 capsule (24 mcg total) by mouth 2 (two) times daily with a meal., Disp: 60 capsule, Rfl: 3 .  propranolol ER (INDERAL LA) 80 MG 24 hr capsule, Take 1 capsule (80 mg total) by mouth daily., Disp: 90 capsule, Rfl: 3 .  topiramate (TOPAMAX) 50 MG tablet, TAKE 1 TABLET (50 MG TOTAL) BY MOUTH AT BEDTIME. (NEEDS TO BE SEEN BEFORE NEXT REFILL), Disp: 30 tablet, Rfl: 0   Social History: Reviewed -  reports that she has been smoking cigars. She has been smoking about 0.00  packs per day for the past 7.00 years. She has never used smokeless tobacco.  Objective Findings:  Vitals: Blood pressure 131/87, pulse (!) 56, height 5\' 3"  (1.6 m), weight 171 lb 3.2 oz (77.7 kg), last menstrual period 08/10/2016.  PHYSICAL EXAMINATION General appearance - alert, well appearing, and in no distress Mental status - alert, oriented to person, place, and time, normal mood, behavior, speech, dress, motor activity, and thought processes, affect appropriate to mood  PELVIC Vagina - scar tissue at the top of the vagina; hard nodule on the right side of vaginal cuff that causes left sided pain.+vaginal cuff nodule in center of cuff that is tender to exam, and causes pain to radiate to the patient's left, described by the patient as similar to her chief complaint   Cervix - surgically absent Uterus - surgically  absent  Assessment & Plan:   A: Intermittent Left lower quadrant pain. 1.  Right sided vaginal cuff nodule; probable post surgical  scar tissue 2. S/p abdominal hysterectomy  P:  1.  Tv u/s in 2 week w/ provider F/u    By signing my name below, I, Samul Dada, attest that this documentation has been prepared under the direction and in the presence of Jonnie Kind, MD. Electronically Signed: [place]. 05/01/19. 9:48 AM.  I personally performed the services described in this documentation, which was SCRIBED in my presence. The recorded information has been reviewed and considered accurate. It has been edited as necessary during review. Jonnie Kind, MD

## 2019-05-14 ENCOUNTER — Other Ambulatory Visit: Payer: Self-pay | Admitting: Obstetrics and Gynecology

## 2019-05-14 DIAGNOSIS — R1032 Left lower quadrant pain: Secondary | ICD-10-CM

## 2019-05-15 ENCOUNTER — Other Ambulatory Visit: Payer: Self-pay

## 2019-05-15 ENCOUNTER — Ambulatory Visit (INDEPENDENT_AMBULATORY_CARE_PROVIDER_SITE_OTHER): Payer: Medicaid Other | Admitting: Obstetrics and Gynecology

## 2019-05-15 ENCOUNTER — Encounter: Payer: Self-pay | Admitting: Obstetrics and Gynecology

## 2019-05-15 ENCOUNTER — Ambulatory Visit (HOSPITAL_COMMUNITY): Payer: Medicaid Other | Attending: Orthopedic Surgery

## 2019-05-15 ENCOUNTER — Ambulatory Visit (INDEPENDENT_AMBULATORY_CARE_PROVIDER_SITE_OTHER): Payer: Medicaid Other

## 2019-05-15 ENCOUNTER — Encounter (HOSPITAL_COMMUNITY): Payer: Self-pay

## 2019-05-15 VITALS — BP 123/85 | HR 60 | Ht 63.0 in | Wt 169.0 lb

## 2019-05-15 DIAGNOSIS — M25642 Stiffness of left hand, not elsewhere classified: Secondary | ICD-10-CM | POA: Diagnosis present

## 2019-05-15 DIAGNOSIS — N83202 Unspecified ovarian cyst, left side: Secondary | ICD-10-CM

## 2019-05-15 DIAGNOSIS — R29898 Other symptoms and signs involving the musculoskeletal system: Secondary | ICD-10-CM | POA: Diagnosis present

## 2019-05-15 DIAGNOSIS — K668 Other specified disorders of peritoneum: Secondary | ICD-10-CM

## 2019-05-15 DIAGNOSIS — M25641 Stiffness of right hand, not elsewhere classified: Secondary | ICD-10-CM | POA: Diagnosis present

## 2019-05-15 DIAGNOSIS — Z79899 Other long term (current) drug therapy: Secondary | ICD-10-CM

## 2019-05-15 DIAGNOSIS — Z79818 Long term (current) use of other agents affecting estrogen receptors and estrogen levels: Secondary | ICD-10-CM

## 2019-05-15 DIAGNOSIS — N83291 Other ovarian cyst, right side: Secondary | ICD-10-CM

## 2019-05-15 DIAGNOSIS — R1032 Left lower quadrant pain: Secondary | ICD-10-CM

## 2019-05-15 DIAGNOSIS — N83292 Other ovarian cyst, left side: Secondary | ICD-10-CM | POA: Diagnosis not present

## 2019-05-15 NOTE — Progress Notes (Signed)
PELVIC US TA/TV,normal vaginal cuff,simple right ovarian cyst 2.8 x 2.5 x 1.4 cm,two left hemorraghic ovarian cysts (#1)4.2 x 2.5 x 3.6 cm,(#2) 2.1 x 1.4 x 2.2 cm,small amount of fluid in left adnexa,ovaries appear mobile,left adnexal pain during ultrasound,complex CDS fluid 4.6 x 6.2 x 2 cm  Chaperone Peggy

## 2019-05-15 NOTE — Therapy (Signed)
[facility] [address] [place], [place], [address] Phone: [phone]   Fax:  [phone]  Occupational Therapy Note  Patient Details  Name: Stacy Wolf MRN: [id number] Date of Birth: 08/24/1975 Referring Provider (OT): Dr. Stanely Harrison   Encounter Date: 05/15/2019  OT End of Session - 05/15/19 1015    OT Start Time  1000    OT Stop Time  1010    OT Time Calculation (min)  10 min       Past Medical History:  Diagnosis Date  . Anxiety   . Bipolar 1 disorder ([facility])   . Heart murmur   . Migraines   . Schizophrenia ([facility])     Past Surgical History:  Procedure Laterality Date  . arm surgery Right    tendonitis-wrist  . BILATERAL SALPINGECTOMY Bilateral 08/29/2016   Procedure: BILATERAL SALPINGECTOMY;  Surgeon: Eure, Luther H, MD;  Location: [facility];  Service: Gynecology;  Laterality: Bilateral;  . CARPAL TUNNEL RELEASE Left 04/11/2016   Procedure: CARPAL TUNNEL RELEASE;  Surgeon: Stanley E Harrison, MD;  Location: [facility];  Service: Orthopedics;  Laterality: Left;  . CARPAL TUNNEL RELEASE Right 03/13/2018   Procedure: RIGHT CARPAL TUNNEL RELEASE;  Surgeon: Harrison, Stanley E, MD;  Location: [facility];  Service: Orthopedics;  Laterality: Right;  . SUPRACERVICAL ABDOMINAL HYSTERECTOMY N/A 08/29/2016   Procedure: HYSTERECTOMY SUPRACERVICAL ABDOMINAL;  Surgeon: Eure, Luther H, MD;  Location: [facility];  Service: Gynecology;  Laterality: N/A;  . TRIGGER FINGER RELEASE Right 11/18/2018   Procedure: RELEASE TRIGGER FINGER/A-1 PULLEY right ring;  Surgeon: Harrison, Stanley E, MD;  Location: [facility];  Service: Orthopedics;  Laterality: Right;  . TUBAL LIGATION      There were no vitals filed for this visit.          05/15/19 1016  OT Assessment and Plan  Clinical Impression Statement A: Patient arrived to select which compression gloves would be the best fit to help decrease bilateral hand swelling. Trialed [place] and ExoSoft brand.  Patient selected Black Medium ExoSoft gloves. Reviewed precautions for use such as removal if any increased swelling or pain occurs, wearing schedule (at the start of the day and throughout as needed), care management (machine wash or hand wash, no softeners, may dry on permanent press or air dry) wear and tear (6 month max before needing to replace). Pt verbalized understanding.  Plan P: No additional OT services needed. Patient will follow up with MD or OT as needed. Gloves will be charged to patient.  Consulted and Agree with Plan of Care Patient                     OT Short Term Goals - 05/15/19 1016      OT SHORT TERM GOAL #1   Title  Patient will be educated and verbalize understanding of HEP and use of compression gloves to decrease edema in bilateral hands and allow her to complete required daily and work related tasks with less difficulty.    Time  3    Period  Weeks    Status  Achieved    Target Date  05/06/19                Visit Diagnosis: Stiffness of left hand, not elsewhere classified  Stiffness of right hand, not elsewhere classified  Other symptoms and signs involving the musculoskeletal system    Problem List Patient Active Problem List   Diagnosis Date Noted  .   Current use of estrogen therapy 01/20/2019  . Posttraumatic stress disorder 10/17/2016  . Personality disorder ([place]) 10/17/2016  . Menorrhagia with irregular cycle 08/13/2016  . Obesity (BMI 30.0-34.9) 04/23/2016  . Essential hypertension, benign 01/16/2016  . Migraine headache 04/18/2015  . Tobacco abuse 02/02/2015  . Constipation 02/02/2015  . Gastroesophageal reflux disease without esophagitis 08/24/2014  . Hyperlipidemia LDL goal <100 09/14/2013  . Bipolar disorder [facility]) 08/25/2013    Ailene Ravel, OTR/L,CBIS  [phone]   05/15/2019, 10:26 AM  [place] [address] [place], [place], [address] Phone:  [phone]   Fax:  [phone]  Name: Stacy Wolf MRN: [id number] Date of Birth: 03/28/1975   OCCUPATIONAL THERAPY DISCHARGE SUMMARY  Visits from Start of Care: 1  Current functional level related to goals / functional outcomes: Patient has been provided with bilateral compression gloves to help decrease edema in hands.    Remaining deficits: Goal has been met. Patient is able to management edema with use of compression gloves and edema massage.    Education / Equipment: Edema massage, contrast bath, use of compression gloves (precautions, wearing schedule, care management) Plan: Patient agrees to discharge.  Patient goals were met. Patient is being discharged due to meeting the stated rehab goals.  ?????        Ailene Ravel, OTR/L,CBIS  [phone]

## 2019-05-15 NOTE — Progress Notes (Signed)
Patient ID: [name], female   DOB: 11/11/1975, 44 y.o.   MRN: [id number]    [facility] Visit  @DATE @            Patient name: Stacy Wolf MRN [id number]  Date of birth: November 23, 1975  CC & HPI:  Stacy Wolf is a 44 y.o. female presenting today for gyn follow of left sided pelvic pain and review of in office TV u/s.  She describes the pain as cramping pain similar to when she was about to begin her period. She is s/p hysterectomy and bilateral salpingectomy.  ROS:  ROS PELVIC US TA/TV,normal vaginal cuff,simple right ovarian cyst 2.8 x 2.5 x 1.4 cm,two left hemorraghic ovarian cysts (#1)4.2 x 2.5 x 3.6 cm,(#2) 2.1 x 1.4 x 2.2 cm,small amount of fluid in left adnexa,ovaries appear mobile,left adnexal pain during ultrasound,complex CDS fluid 4.6 x 6.2 x 2 cm  Pertinent History Reviewed:   Reviewed: Significant for  Medical         Past Medical History:  Diagnosis Date  . Anxiety   . Bipolar 1 disorder ([place])   . Heart murmur   . Migraines   . Schizophrenia [facility])                               Surgical Hx:    Past Surgical History:  Procedure Laterality Date  . arm surgery Right    tendonitis-wrist  . BILATERAL SALPINGECTOMY Bilateral 08/29/2016   Procedure: BILATERAL SALPINGECTOMY;  Surgeon: Florian Buff, MD;  Location: [facility];  Service: Gynecology;  Laterality: Bilateral;  . CARPAL TUNNEL RELEASE Left 04/11/2016   Procedure: CARPAL TUNNEL RELEASE;  Surgeon: [name], MD;  Location: [facility];  Service: Orthopedics;  Laterality: Left;  . CARPAL TUNNEL RELEASE Right 03/13/2018   Procedure: RIGHT CARPAL TUNNEL RELEASE;  Surgeon: [name], MD;  Location: [facility];  Service: Orthopedics;  Laterality: Right;  . SUPRACERVICAL ABDOMINAL HYSTERECTOMY N/A 08/29/2016   Procedure: HYSTERECTOMY SUPRACERVICAL ABDOMINAL;  Surgeon: Florian Buff, MD;  Location: [facility];  Service: Gynecology;  Laterality: N/A;  . TRIGGER FINGER RELEASE Right 11/18/2018   Procedure:  RELEASE TRIGGER FINGER/A-1 PULLEY right ring;  Surgeon: [name], MD;  Location: [facility];  Service: Orthopedics;  Laterality: Right;  . TUBAL LIGATION     Medications: Reviewed & Updated - see associated section                       Current Outpatient Medications:  .  aspirin-acetaminophen-caffeine (EXCEDRIN MIGRAINE) 250-250-65 MG tablet, Take 2 tablets by mouth every 6 (six) hours as needed for headache or migraine. , Disp: , Rfl:  .  estradiol (ESTRACE) 2 MG tablet, Take 1 tablet (2 mg total) by mouth at bedtime., Disp: 90 tablet, Rfl: 4 .  lubiprostone (AMITIZA) 24 MCG capsule, Take 1 capsule (24 mcg total) by mouth 2 (two) times daily with a meal., Disp: 60 capsule, Rfl: 3 .  propranolol ER (INDERAL LA) 80 MG 24 hr capsule, Take 1 capsule (80 mg total) by mouth daily., Disp: 90 capsule, Rfl: 3 .  topiramate (TOPAMAX) 50 MG tablet, TAKE 1 TABLET (50 MG TOTAL) BY MOUTH AT BEDTIME. (NEEDS TO BE SEEN BEFORE NEXT REFILL), Disp: 30 tablet, Rfl: 0   Social History: Reviewed -  reports that she has been smoking cigars. She has been smoking about 0.00 packs  per day for the past 7.00 years. She has never used smokeless tobacco.  Objective Findings:  Vitals: Last menstrual period 08/10/2016.  PHYSICAL EXAMINATION General appearance - alert, well appearing, and in no distress Mental status - alert, oriented to person, place, and time, normal mood, behavior, speech, dress, motor activity, and thought processes, affect appropriate to mood  PELVIC Deferred discussion only  Assessment & Plan:   A:  1. Hemorrhagic ovarian cysts; normal variation 2. Chronic pelvic pain; use OTC 3. Medication management  P:  1. Call pharmacy for refill 2. F/u November for medication management and refill; televisit  is an option. May consider reducing to 1 mg/d.   By signing my name below, I, Samul Dada, attest that this documentation has been prepared under the direction and in the presence of  Jonnie Kind, MD. Electronically Signed: [place]. 05/15/19. 9:04 AM.  I personally performed the services described in this documentation, which was SCRIBED in my presence. The recorded information has been reviewed and considered accurate. It has been edited as necessary during review. Jonnie Kind, MD

## 2019-06-04 ENCOUNTER — Ambulatory Visit: Payer: Medicaid Other | Attending: Internal Medicine

## 2019-06-04 DIAGNOSIS — Z23 Encounter for immunization: Secondary | ICD-10-CM

## 2019-06-04 NOTE — Progress Notes (Signed)
   Covid-19 Vaccination Clinic  Name:  Stacy Wolf    MRN: [id number] DOB: 07-Jan-1976  06/04/2019  [name] was observed post Covid-19 immunization for 15 minutes without incident. She was provided with Vaccine Information Sheet and instruction to access the V-Safe system.   [name] was instructed to call 911 with any severe reactions post vaccine: Marland Kitchen Difficulty breathing  . Swelling of face and throat  . A fast heartbeat  . A bad rash all over body  . Dizziness and weakness   Immunizations Administered    Name Date Dose VIS Date Route   Moderna COVID-19 Vaccine 06/04/2019  9:50 AM 0.5 mL 02/03/2019 Intramuscular   Manufacturer: Moderna   Lot: [id number]   [id number]

## 2019-07-02 ENCOUNTER — Ambulatory Visit: Payer: Medicaid Other | Attending: Internal Medicine

## 2019-07-02 DIAGNOSIS — Z23 Encounter for immunization: Secondary | ICD-10-CM

## 2019-07-02 NOTE — Progress Notes (Signed)
   Covid-19 Vaccination Clinic  Name:  Stacy Wolf    MRN: [id number] DOB: Jun 27, 1975  07/02/2019  Stacy Wolf was observed post Covid-19 immunization for 15 minutes without incident. She was provided with Vaccine Information Sheet and instruction to access the V-Safe system.   Stacy Wolf was instructed to call 911 with any severe reactions post vaccine: Marland Kitchen Difficulty breathing  . Swelling of face and throat  . A fast heartbeat  . A bad rash all over body  . Dizziness and weakness   Immunizations Administered    Name Date Dose VIS Date Route   Moderna COVID-19 Vaccine 07/02/2019  9:55 AM 0.5 mL 02/2019 Intramuscular   Manufacturer: Moderna   Lot: [id number]   [id number]

## 2019-09-22 ENCOUNTER — Ambulatory Visit: Payer: Medicaid Other | Admitting: Family Medicine

## 2019-10-21 ENCOUNTER — Ambulatory Visit: Payer: Medicaid Other | Admitting: Family Medicine

## 2019-10-21 ENCOUNTER — Other Ambulatory Visit: Payer: Self-pay

## 2019-10-21 ENCOUNTER — Encounter: Payer: Self-pay | Admitting: Family Medicine

## 2019-10-21 VITALS — BP 147/90 | HR 71 | Temp 97.9°F | Ht 63.0 in | Wt 162.0 lb

## 2019-10-21 DIAGNOSIS — K219 Gastro-esophageal reflux disease without esophagitis: Secondary | ICD-10-CM

## 2019-10-21 DIAGNOSIS — E785 Hyperlipidemia, unspecified: Secondary | ICD-10-CM

## 2019-10-21 DIAGNOSIS — I10 Essential (primary) hypertension: Secondary | ICD-10-CM

## 2019-10-21 MED ORDER — AMLODIPINE BESYLATE 5 MG PO TABS: 5.0000 mg | ORAL_TABLET | Freq: Every day | ORAL | 3 refills | Status: DC

## 2019-10-21 MED ORDER — FLUCONAZOLE 150 MG PO TABS: 150.0000 mg | ORAL_TABLET | Freq: Once | ORAL | 0 refills | Status: AC

## 2019-10-21 MED ORDER — LISINOPRIL 10 MG PO TABS: 10.0000 mg | ORAL_TABLET | Freq: Every day | ORAL | 3 refills | Status: DC

## 2019-10-21 NOTE — Progress Notes (Signed)
BP (!) 147/90   Pulse 71   Temp 97.9 F (36.6 C)   Ht '5\' 3"'  (1.6 m)   Wt 162 lb (73.5 kg)   LMP 08/10/2016 Comment: [place]  SpO2 98%   BMI 28.70 kg/m    Subjective:   Patient ID: Stacy Wolf, female    DOB: [birth year], 44 y.o.   MRN: [id number]  HPI: Stacy Wolf is a 44 y.o. female presenting on 10/21/2019 for Medical Management of Chronic Issues and Hypertension   HPI Hypertension Patient is currently on lisinopril but just started a little over a week ago, been relatively new that her blood pressures been up, 178/125 and 175/102 at home, and their blood pressure today is 147/90. Patient denies any lightheadedness or dizziness. Patient denies headaches, blurred vision, chest pains, shortness of breath, or weakness. Denies any side effects from medication and is content with current medication.   Hyperlipidemia Patient is coming in for recheck of his hyperlipidemia. The patient is currently taking no medication currently and will check blood work. They deny any issues with myalgias or history of liver damage from it. They deny any focal numbness or weakness or chest pain.   GERD Patient is currently on no medication currently and has been doing well.  She denies any major symptoms or abdominal pain or belching or burping. She denies any blood in her stool or lightheadedness or dizziness.   Relevant past medical, surgical, family and social history reviewed and updated as indicated. Interim medical history since our last visit reviewed. Allergies and medications reviewed and updated.  Review of Systems  Constitutional: Negative for chills and fever.  Eyes: Negative for photophobia and visual disturbance.  Respiratory: Negative for chest tightness and shortness of breath.   Cardiovascular: Negative for chest pain and leg swelling.  Genitourinary: Negative for difficulty urinating and dysuria.  Musculoskeletal: Negative for back pain and gait problem.  Skin: Negative for rash.   Neurological: Positive for headaches. Negative for dizziness and light-headedness.  Psychiatric/Behavioral: Negative for agitation and behavioral problems.  All other systems reviewed and are negative.   Per HPI unless specifically indicated above   Allergies as of 10/21/2019      Reactions   Imitrex [sumatriptan] Anaphylaxis, Other (See Comments)   Could not swallow after taking med   Latuda [lurasidone Hcl] Other (See Comments)   Suicidal thoughts   Wellbutrin [bupropion] Other (See Comments)   Seizures / lowered seizure threshold   Zoloft [sertraline Hcl] Hives   Haloperidol And Related Other (See Comments)   Leg cramps   Nicoderm [nicotine] Dermatitis      Medication List       Accurate as of October 21, 2019 12:38 PM. If you have any questions, ask your nurse or doctor.        STOP taking these medications   lubiprostone 24 MCG capsule Commonly known as: AMITIZA Stopped by: Fransisca Kaufmann Friend Dorfman, MD   propranolol ER 80 MG 24 hr capsule Commonly known as: Inderal LA Stopped by: Fransisca Kaufmann Donovyn Guidice, MD   topiramate 50 MG tablet Commonly known as: TOPAMAX Stopped by: Fransisca Kaufmann Shatona Andujar, MD     TAKE these medications   amLODipine 5 MG tablet Commonly known as: NORVASC Take 1 tablet (5 mg total) by mouth daily. Started by: Worthy Rancher, MD   aspirin-acetaminophen-caffeine [phone] MG tablet Commonly known as: EXCEDRIN MIGRAINE Take 2 tablets by mouth every 6 (six) hours as needed for headache or migraine.   estradiol  2 MG tablet Commonly known as: ESTRACE Take 1 tablet (2 mg total) by mouth at bedtime.   fluconazole 150 MG tablet Commonly known as: Diflucan Take 1 tablet (150 mg total) by mouth once for 1 dose. Started by: Fransisca Kaufmann [place] Wanner, MD   lisinopril 10 MG tablet Commonly known as: ZESTRIL Take 1 tablet (10 mg total) by mouth daily.        Objective:   BP (!) 147/90   Pulse 71   Temp 97.9 F (36.6 C)   Ht '5\' 3"'  (1.6 m)   Wt 162  lb (73.5 kg)   LMP 08/10/2016 Comment: [place]  SpO2 98%   BMI 28.70 kg/m   Wt Readings from Last 3 Encounters:  10/21/19 162 lb (73.5 kg)  05/15/19 169 lb (76.7 kg)  05/01/19 171 lb 3.2 oz (77.7 kg)    Physical Exam Vitals and nursing note reviewed.  Constitutional:      General: She is not in acute distress.    Appearance: She is well-developed. She is not diaphoretic.  Eyes:     Conjunctiva/sclera: Conjunctivae normal.  Cardiovascular:     Rate and Rhythm: Normal rate and regular rhythm.     Heart sounds: Normal heart sounds. No murmur heard.   Pulmonary:     Effort: Pulmonary effort is normal. No respiratory distress.     Breath sounds: Normal breath sounds. No wheezing.  Musculoskeletal:        General: No tenderness. Normal range of motion.  Skin:    General: Skin is warm and dry.     Findings: No rash.  Neurological:     Mental Status: She is alert and oriented to person, place, and time.     Coordination: Coordination normal.  Psychiatric:        Behavior: Behavior normal.       Assessment & Plan:   Problem List Items Addressed This Visit      Cardiovascular and Mediastinum   Essential hypertension, benign - Primary   Relevant Medications   lisinopril (ZESTRIL) 10 MG tablet   amLODipine (NORVASC) 5 MG tablet   Other Relevant Orders   CMP14+EGFR   TSH     Digestive   Gastroesophageal reflux disease without esophagitis   Relevant Orders   CBC with Differential/Platelet   TSH     Other   Hyperlipidemia LDL goal <100   Relevant Medications   lisinopril (ZESTRIL) 10 MG tablet   amLODipine (NORVASC) 5 MG tablet   Other Relevant Orders   CMP14+EGFR   Lipid panel   TSH      Will add amlodipine to her lisinopril, recheck in 2 to 3 weeks Follow up plan: Return if symptoms worsen or fail to improve, for Hypertension 2 to 3 weeks.  Counseling provided for all of the vaccine components Orders Placed This Encounter  Procedures  . CBC with  Differential/Platelet  . CMP14+EGFR  . Lipid panel  . TSH    Caryl Pina, MD [facility] 10/21/2019, 12:38 PM

## 2019-10-22 ENCOUNTER — Telehealth: Payer: Self-pay

## 2019-10-22 LAB — CBC WITH DIFFERENTIAL/PLATELET
Basophils Absolute: 0.1 10*3/uL (ref 0.0–0.2)
Basos: 1 %
EOS (ABSOLUTE): 0.1 10*3/uL (ref 0.0–0.4)
Eos: 1 %
Hematocrit: 38.4 % (ref 34.0–46.6)
Hemoglobin: 13.4 g/dL (ref 11.1–15.9)
Immature Grans (Abs): 0 10*3/uL (ref 0.0–0.1)
Immature Granulocytes: 0 %
Lymphocytes Absolute: 2.4 10*3/uL (ref 0.7–3.1)
Lymphs: 35 %
MCH: 33.5 pg — ABNORMAL HIGH (ref 26.6–33.0)
MCHC: 34.9 g/dL (ref 31.5–35.7)
MCV: 96 fL (ref 79–97)
Monocytes Absolute: 0.4 10*3/uL (ref 0.1–0.9)
Monocytes: 6 %
Neutrophils Absolute: 3.9 10*3/uL (ref 1.4–7.0)
Neutrophils: 57 %
Platelets: 212 10*3/uL (ref 150–450)
RBC: 4 x10E6/uL (ref 3.77–5.28)
RDW: 12.7 % (ref 11.7–15.4)
WBC: 6.9 10*3/uL (ref 3.4–10.8)

## 2019-10-22 LAB — CMP14+EGFR
ALT: 12 IU/L (ref 0–32)
AST: 15 IU/L (ref 0–40)
Albumin/Globulin Ratio: 1.8 (ref 1.2–2.2)
Albumin: 4.2 g/dL (ref 3.8–4.8)
Alkaline Phosphatase: 84 IU/L (ref 48–121)
BUN/Creatinine Ratio: 12 (ref 9–23)
BUN: 9 mg/dL (ref 6–24)
Bilirubin Total: 0.3 mg/dL (ref 0.0–1.2)
CO2: 23 mmol/L (ref 20–29)
Calcium: 9 mg/dL (ref 8.7–10.2)
Chloride: 104 mmol/L (ref 96–106)
Creatinine, Ser: 0.73 mg/dL (ref 0.57–1.00)
GFR calc Af Amer: 115 mL/min/{1.73_m2} (ref >59)
GFR calc non Af Amer: 100 mL/min/{1.73_m2} (ref >59)
Globulin, Total: 2.3 g/dL (ref 1.5–4.5)
Glucose: 90 mg/dL (ref 65–99)
Potassium: 4.2 mmol/L (ref 3.5–5.2)
Sodium: 140 mmol/L (ref 134–144)
Total Protein: 6.5 g/dL (ref 6.0–8.5)

## 2019-10-22 LAB — LIPID PANEL
Chol/HDL Ratio: 2.9 ratio (ref 0.0–4.4)
Cholesterol, Total: 199 mg/dL (ref 100–199)
HDL: 69 mg/dL (ref >39)
LDL Chol Calc (NIH): 113 mg/dL — ABNORMAL HIGH (ref 0–99)
Triglycerides: 96 mg/dL (ref 0–149)
VLDL Cholesterol Cal: 17 mg/dL (ref 5–40)

## 2019-10-22 LAB — TSH: TSH: 0.664 u[IU]/mL (ref 0.450–4.500)

## 2019-10-22 NOTE — Telephone Encounter (Signed)
Pt started new BP med last night. BP today is 150/74. Per note yesterday BP was 147/90. Pt still c/o HA, and feeling like she is going to vomit.  Pt admits to taking excedrin Migraine 3x weekly. No caffeine in diet. Pt states that the elevated BP does give her anxiety.  Instructed pt to get something to eat, drink cold water, take something for her HA ( she has not taken Excedrin yet) and rest in cool, dark room.   Informed her that we would address wether this was a BP concern vs HA/migraine, etc with [place].

## 2019-10-22 NOTE — Telephone Encounter (Signed)
It will take at least a week for this blood pressure medicine to get into her system, continue to take it plus the other 1 and keep track of your blood pressures and let me know if anything gets better or worsens over the next week.

## 2019-10-22 NOTE — Telephone Encounter (Signed)
Patient aware and verbalizes understanding. 

## 2019-11-04 ENCOUNTER — Other Ambulatory Visit: Payer: Self-pay

## 2019-11-04 ENCOUNTER — Ambulatory Visit: Payer: Medicaid Other | Admitting: Family Medicine

## 2019-11-04 ENCOUNTER — Encounter: Payer: Self-pay | Admitting: Family Medicine

## 2019-11-04 VITALS — BP 120/75 | HR 77 | Temp 98.0°F | Ht 63.0 in | Wt 160.0 lb

## 2019-11-04 DIAGNOSIS — I10 Essential (primary) hypertension: Secondary | ICD-10-CM

## 2019-11-04 DIAGNOSIS — F419 Anxiety disorder, unspecified: Secondary | ICD-10-CM

## 2019-11-04 MED ORDER — BUSPIRONE HCL 15 MG PO TABS: 15.0000 mg | ORAL_TABLET | Freq: Two times a day (BID) | ORAL | 2 refills | Status: DC

## 2019-11-04 NOTE — Progress Notes (Signed)
BP 120/75   Pulse 77   Temp 98 F (36.7 C)   Ht 5\' 3"  (1.6 m)   Wt 160 lb (72.6 kg)   LMP 08/10/2016 Comment: [place]  SpO2 99%   BMI 28.34 kg/m    Subjective:   Patient ID: Stacy Wolf, female    DOB: [birth year], 44 y.o.   MRN: [id number]  HPI: Stacy Wolf is a 44 y.o. female presenting on 11/04/2019 for Medical Management of Chronic Issues and Hypertension   HPI Hypertension Patient is currently on amlodipine and lisinopril, and their blood pressure today is 120 20/75. Patient denies any lightheadedness or dizziness. Patient denies headaches, blurred vision, chest pains, shortness of breath, or weakness. Denies any side effects from medication and is content with current medication.   Anxiety  Patient is coming in to discuss anxiety and feelings of anxiousness that have been building up.  She says workplace has been causing her a lot of anxiety and her blood pressure still rubs up sometimes.  She thinks her blood pressure is more related to her anxiety would like to try something mild to help with that.  Patient denies any suicidal ideations.  Relevant past medical, surgical, family and social history reviewed and updated as indicated. Interim medical history since our last visit reviewed. Allergies and medications reviewed and updated.  Review of Systems  Constitutional: Negative for chills and fever.  Eyes: Negative for visual disturbance.  Respiratory: Negative for chest tightness and shortness of breath.   Cardiovascular: Negative for chest pain and leg swelling.  Skin: Negative for rash.  Neurological: Negative for dizziness, light-headedness and headaches.  Psychiatric/Behavioral: Positive for dysphoric mood. Negative for agitation, behavioral problems, self-injury, sleep disturbance and suicidal ideas. The patient is nervous/anxious.   All other systems reviewed and are negative.   Per HPI unless specifically indicated above   Allergies as of 11/04/2019       Reactions   Imitrex [sumatriptan] Anaphylaxis, Other (See Comments)   Could not swallow after taking med   Latuda [lurasidone Hcl] Other (See Comments)   Suicidal thoughts   Wellbutrin [bupropion] Other (See Comments)   Seizures / lowered seizure threshold   Zoloft [sertraline Hcl] Hives   Haloperidol And Related Other (See Comments)   Leg cramps   Nicoderm [nicotine] Dermatitis      Medication List       Accurate as of November 04, 2019 11:26 AM. If you have any questions, ask your nurse or doctor.        amLODipine 5 MG tablet Commonly known as: NORVASC Take 1 tablet (5 mg total) by mouth daily.   aspirin-acetaminophen-caffeine 250-250-65 MG tablet Commonly known as: EXCEDRIN MIGRAINE Take 2 tablets by mouth every 6 (six) hours as needed for headache or migraine.   busPIRone 15 MG tablet Commonly known as: BUSPAR Take 1 tablet (15 mg total) by mouth 2 (two) times daily. Started by: Worthy Rancher, MD   estradiol 2 MG tablet Commonly known as: ESTRACE Take 1 tablet (2 mg total) by mouth at bedtime.   lisinopril 10 MG tablet Commonly known as: ZESTRIL Take 1 tablet (10 mg total) by mouth daily.        Objective:   BP 120/75   Pulse 77   Temp 98 F (36.7 C)   Ht 5\' 3"  (1.6 m)   Wt 160 lb (72.6 kg)   LMP 08/10/2016 Comment: Stacy Wolf  SpO2 99%   BMI 28.34 kg/m   Wt Readings  from Last 3 Encounters:  11/04/19 160 lb (72.6 kg)  10/21/19 162 lb (73.5 kg)  05/15/19 169 lb (76.7 kg)    Physical Exam Vitals and nursing note reviewed.  Constitutional:      General: She is not in acute distress.    Appearance: She is well-developed. She is not diaphoretic.  Eyes:     Conjunctiva/sclera: Conjunctivae normal.  Cardiovascular:     Rate and Rhythm: Normal rate and regular rhythm.     Heart sounds: Normal heart sounds. No murmur heard.   Pulmonary:     Effort: Pulmonary effort is normal. No respiratory distress.     Breath sounds: Normal breath sounds. No  wheezing.  Musculoskeletal:        General: No tenderness. Normal range of motion.  Skin:    General: Skin is warm and dry.     Findings: No rash.  Neurological:     Mental Status: She is alert and oriented to person, place, and time.     Coordination: Coordination normal.  Psychiatric:        Mood and Affect: Mood is anxious. Mood is not depressed.        Behavior: Behavior normal.        Thought Content: Thought content does not include suicidal ideation. Thought content does not include suicidal plan.       Assessment & Plan:   Problem List Items Addressed This Visit      Cardiovascular and Mediastinum   Essential hypertension, benign - Primary   Relevant Medications   busPIRone (BUSPAR) 15 MG tablet    Other Visit Diagnoses    Anxiety       Relevant Medications   busPIRone (BUSPAR) 15 MG tablet      Will start buspirone and see how she does with anxiety. Follow up plan: Return if symptoms worsen or fail to improve, for 4-week follow-up for hypertension and anxiety.  Counseling provided for all of the vaccine components No orders of the defined types were placed in this encounter.   Caryl Pina, MD [facility] 11/04/2019, 11:26 AM

## 2019-11-27 ENCOUNTER — Other Ambulatory Visit: Payer: Self-pay | Admitting: Family Medicine

## 2019-11-27 DIAGNOSIS — I10 Essential (primary) hypertension: Secondary | ICD-10-CM

## 2019-12-02 ENCOUNTER — Other Ambulatory Visit: Payer: Self-pay

## 2019-12-02 ENCOUNTER — Encounter: Payer: Self-pay | Admitting: Family Medicine

## 2019-12-02 ENCOUNTER — Ambulatory Visit: Payer: Medicaid Other | Admitting: Family Medicine

## 2019-12-02 ENCOUNTER — Other Ambulatory Visit (HOSPITAL_COMMUNITY)
Admission: RE | Admit: 2019-12-02 | Discharge: 2019-12-02 | Disposition: A | Discharge status: Home / Self Care | Payer: Medicaid Other | Source: Ambulatory Visit | Attending: Family Medicine | Admitting: Family Medicine

## 2019-12-02 VITALS — BP 126/77 | HR 67 | Temp 98.0°F | Ht 63.0 in | Wt 161.0 lb

## 2019-12-02 DIAGNOSIS — F419 Anxiety disorder, unspecified: Secondary | ICD-10-CM

## 2019-12-02 DIAGNOSIS — N898 Other specified noninflammatory disorders of vagina: Secondary | ICD-10-CM

## 2019-12-02 NOTE — Progress Notes (Signed)
BP 126/77   Pulse 67   Temp 98 F (36.7 C)   Ht 5\' 3"  (1.6 m)   Wt 161 lb (73 kg)   LMP 08/10/2016 Comment: [place]  SpO2 98%   BMI 28.52 kg/m    Subjective:   Patient ID: Stacy Wolf, female    DOB: 10-11-1975, 44 y.o.   MRN: [id number]  HPI: Stacy Wolf is a 44 y.o. female presenting on 12/02/2019 for Medical Management of Chronic Issues and Hypertension   HPI Patient is coming in for anxiety recheck and she is doing really well on her anxiety. Continue BuSpar.  Patient is coming in today with complaints of vaginal discharge and irritation. She says is been going on and off over the past couple months. Patient has tried treatments of Monistat in the past and it has gotten better slightly, this most recent time the discharge started back up a week ago. She says it does start up commonly after having intercourse with her boyfriend. The Monistat did not help at all this time and she is having significant discharge and irritation and even some irritation when she urinates.  Relevant past medical, surgical, family and social history reviewed and updated as indicated. Interim medical history since our last visit reviewed. Allergies and medications reviewed and updated.  Review of Systems  Constitutional: Negative for chills and fever.  Eyes: Negative for visual disturbance.  Respiratory: Negative for chest tightness and shortness of breath.   Cardiovascular: Negative for chest pain and leg swelling.  Gastrointestinal: Negative for abdominal pain.  Genitourinary: Positive for dysuria and vaginal discharge. Negative for difficulty urinating, urgency, vaginal bleeding and vaginal pain.  Musculoskeletal: Negative for back pain and gait problem.  Skin: Negative for rash.  Neurological: Negative for light-headedness and headaches.  Psychiatric/Behavioral: Negative for agitation and behavioral problems.  All other systems reviewed and are negative.   Per HPI unless specifically  indicated above   Allergies as of 12/02/2019      Reactions   Imitrex [sumatriptan] Anaphylaxis, Other (See Comments)   Could not swallow after taking med   Latuda [lurasidone Hcl] Other (See Comments)   Suicidal thoughts   Wellbutrin [bupropion] Other (See Comments)   Seizures / lowered seizure threshold   Zoloft [sertraline Hcl] Hives   Haloperidol And Related Other (See Comments)   Leg cramps   Nicoderm [nicotine] Dermatitis      Medication List       Accurate as of December 02, 2019  4:05 PM. If you have any questions, ask your nurse or doctor.        amLODipine 5 MG tablet Commonly known as: NORVASC Take 1 tablet (5 mg total) by mouth daily.   aspirin-acetaminophen-caffeine 250-250-65 MG tablet Commonly known as: EXCEDRIN MIGRAINE Take 2 tablets by mouth every 6 (six) hours as needed for headache or migraine.   busPIRone 15 MG tablet Commonly known as: BUSPAR TAKE 1 TABLET (15 MG TOTAL) BY MOUTH 2 (TWO) TIMES DAILY.   estradiol 2 MG tablet Commonly known as: ESTRACE Take 1 tablet (2 mg total) by mouth at bedtime.   lisinopril 10 MG tablet Commonly known as: ZESTRIL Take 1 tablet (10 mg total) by mouth daily.        Objective:   BP 126/77   Pulse 67   Temp 98 F (36.7 C)   Ht 5\' 3"  (1.6 m)   Wt 161 lb (73 kg)   LMP 08/10/2016 Comment: [place]  SpO2 98%  BMI 28.52 kg/m   Wt Readings from Last 3 Encounters:  12/02/19 161 lb (73 kg)  11/04/19 160 lb (72.6 kg)  10/21/19 162 lb (73.5 kg)    Physical Exam Vitals and nursing note reviewed. Exam conducted with a chaperone present.  Constitutional:      General: She is not in acute distress.    Appearance: She is well-developed. She is not diaphoretic.  Eyes:     Conjunctiva/sclera: Conjunctivae normal.  Cardiovascular:     Rate and Rhythm: Normal rate and regular rhythm.     Heart sounds: Normal heart sounds. No murmur heard.   Pulmonary:     Effort: Pulmonary effort is normal. No respiratory  distress.     Breath sounds: Normal breath sounds. No wheezing.  Genitourinary:    Vagina: Vaginal discharge present. No tenderness or bleeding.     Cervix: Discharge present. No cervical motion tenderness or erythema.     Adnexa: Right adnexa normal and left adnexa normal.  Musculoskeletal:        General: No tenderness. Normal range of motion.  Skin:    General: Skin is warm and dry.     Findings: No rash.  Neurological:     Mental Status: She is alert and oriented to person, place, and time.     Coordination: Coordination normal.  Psychiatric:        Behavior: Behavior normal.       Assessment & Plan:   Problem List Items Addressed This Visit    None    Visit Diagnoses    Vaginal discharge    -  Primary   Relevant Orders   Cervicovaginal ancillary only   Anxiety        Continue BuSpar, will send sample over. Patient does have a significant discharge looks like yeast but will await results and send treatment because she is been fighting this off and on for some time.  Follow up plan: Return if symptoms worsen or fail to improve.  Counseling provided for all of the vaccine components No orders of the defined types were placed in this encounter.   Caryl Pina, MD [facility] 12/02/2019, 4:05 PM

## 2019-12-03 ENCOUNTER — Telehealth: Payer: Self-pay | Admitting: Family Medicine

## 2019-12-03 NOTE — Telephone Encounter (Signed)
Pt aware - still pending

## 2019-12-04 ENCOUNTER — Other Ambulatory Visit: Payer: Self-pay | Admitting: Family Medicine

## 2019-12-04 ENCOUNTER — Telehealth: Payer: Self-pay | Admitting: Family Medicine

## 2019-12-04 DIAGNOSIS — I10 Essential (primary) hypertension: Secondary | ICD-10-CM

## 2019-12-04 LAB — CERVICOVAGINAL ANCILLARY ONLY
Bacterial Vaginitis (gardnerella): POSITIVE — AB
Candida Glabrata: NEGATIVE
Candida Vaginitis: POSITIVE — AB
Comment: NEGATIVE
Comment: NEGATIVE
Comment: NEGATIVE

## 2019-12-04 MED ORDER — FLUCONAZOLE 150 MG PO TABS: 150.0000 mg | ORAL_TABLET | Freq: Once | ORAL | 0 refills | Status: AC

## 2019-12-04 MED ORDER — METRONIDAZOLE 500 MG PO TABS: 500.0000 mg | ORAL_TABLET | Freq: Two times a day (BID) | ORAL | 0 refills | Status: DC

## 2019-12-04 NOTE — Telephone Encounter (Signed)
patient came back positive for both yeast and bacteria in her vaginal swab, I have sent an antibiotic and Diflucan for her

## 2019-12-04 NOTE — Telephone Encounter (Signed)
Pt informed of results and knows she has 2 medications to complete.

## 2020-01-24 ENCOUNTER — Other Ambulatory Visit: Payer: Self-pay | Admitting: Adult Health

## 2020-01-24 ENCOUNTER — Other Ambulatory Visit: Payer: Self-pay | Admitting: Family Medicine

## 2020-01-24 DIAGNOSIS — I10 Essential (primary) hypertension: Secondary | ICD-10-CM

## 2020-02-19 ENCOUNTER — Ambulatory Visit (INDEPENDENT_AMBULATORY_CARE_PROVIDER_SITE_OTHER): Payer: Medicaid Other | Admitting: Family Medicine

## 2020-02-19 ENCOUNTER — Encounter: Payer: Self-pay | Admitting: Family Medicine

## 2020-02-19 ENCOUNTER — Other Ambulatory Visit: Payer: Self-pay

## 2020-02-19 VITALS — BP 136/91 | HR 92 | Ht 63.0 in | Wt 160.0 lb

## 2020-02-19 DIAGNOSIS — Z0000 Encounter for general adult medical examination without abnormal findings: Secondary | ICD-10-CM | POA: Diagnosis not present

## 2020-02-19 DIAGNOSIS — Z1159 Encounter for screening for other viral diseases: Secondary | ICD-10-CM | POA: Diagnosis not present

## 2020-02-19 DIAGNOSIS — Z01419 Encounter for gynecological examination (general) (routine) without abnormal findings: Secondary | ICD-10-CM

## 2020-02-19 DIAGNOSIS — I10 Essential (primary) hypertension: Secondary | ICD-10-CM

## 2020-02-19 DIAGNOSIS — K219 Gastro-esophageal reflux disease without esophagitis: Secondary | ICD-10-CM

## 2020-02-19 DIAGNOSIS — E785 Hyperlipidemia, unspecified: Secondary | ICD-10-CM

## 2020-02-19 DIAGNOSIS — Z1231 Encounter for screening mammogram for malignant neoplasm of breast: Secondary | ICD-10-CM

## 2020-02-19 NOTE — Progress Notes (Signed)
BP (!) 136/91   Pulse 92   Ht 5\' 3"  (1.6 m)   Wt 160 lb (72.6 kg)   LMP 08/10/2016 Comment: [place]  SpO2 95%   BMI 28.34 kg/m    Subjective:   Patient ID: Stacy Wolf, female    DOB: Oct 28, 1975, 44 y.o.   MRN: [id number]  HPI: Stacy Wolf is a 44 y.o. female presenting on 02/19/2020 for Hypertension   HPI Patient is coming in today for adult well exam and physical and gynecological exam Patient had a hysterectomy 3 years ago and has been doing well with that.  She did just get retreated for some vaginal discharge and feels like things are improved and gone back to baseline.  She denies any breast lumps or bumps.  She is sexually active with one partner.  Hypertension Patient is currently on amlodipine and lisinopril, and their blood pressure today is 136/91, she does admit that she did not take it today because she was on an empty stomach.. Patient denies any lightheadedness or dizziness. Patient denies headaches, blurred vision, chest pains, shortness of breath, or weakness. Denies any side effects from medication and is content with current medication.   Hyperlipidemia Patient is coming in for recheck of his hyperlipidemia. The patient is currently taking no medication currently we are monitoring.  She is doing diet control.. They deny any issues with myalgias or history of liver damage from it. They deny any focal numbness or weakness or chest pain.   Patient takes hormone replacement therapy because she had a total hysterectomy at a younger age.  Relevant past medical, surgical, family and social history reviewed and updated as indicated. Interim medical history since our last visit reviewed. Allergies and medications reviewed and updated.  Review of Systems  Constitutional: Negative for chills and fever.  Eyes: Negative for pain and visual disturbance.  Respiratory: Negative for cough, chest tightness, shortness of breath and wheezing.   Cardiovascular: Negative for  chest pain, palpitations and leg swelling.  Gastrointestinal: Negative for abdominal pain, blood in stool, constipation and diarrhea.  Genitourinary: Negative for difficulty urinating, dysuria and hematuria.  Musculoskeletal: Negative for back pain, gait problem and myalgias.  Skin: Negative for rash and wound.  Neurological: Negative for dizziness, weakness, light-headedness and headaches.  Psychiatric/Behavioral: Negative for agitation, behavioral problems and suicidal ideas.  All other systems reviewed and are negative.   Per HPI unless specifically indicated above   Allergies as of 02/19/2020      Reactions   Imitrex [sumatriptan] Anaphylaxis, Other (See Comments)   Could not swallow after taking med   Latuda [lurasidone Hcl] Other (See Comments)   Suicidal thoughts   Wellbutrin [bupropion] Other (See Comments)   Seizures / lowered seizure threshold   Zoloft [sertraline Hcl] Hives   Haloperidol And Related Other (See Comments)   Leg cramps   Nicoderm [nicotine] Dermatitis      Medication List       Accurate as of February 19, 2020  2:22 PM. If you have any questions, ask your nurse or doctor.        STOP taking these medications   metroNIDAZOLE 500 MG tablet Commonly known as: Flagyl Stopped by: Fransisca Kaufmann Prima Rayner, MD     TAKE these medications   amLODipine 5 MG tablet Commonly known as: NORVASC Take 1 tablet (5 mg total) by mouth daily.   aspirin-acetaminophen-caffeine 250-250-65 MG tablet Commonly known as: EXCEDRIN MIGRAINE Take 2 tablets by mouth every 6 (six) hours  as needed for headache or migraine.   busPIRone 15 MG tablet Commonly known as: BUSPAR TAKE 1 TABLET (15 MG TOTAL) BY MOUTH 2 (TWO) TIMES DAILY.   estradiol 2 MG tablet Commonly known as: ESTRACE TAKE 1 TABLET (2 MG TOTAL) BY MOUTH AT BEDTIME.   lisinopril 10 MG tablet Commonly known as: ZESTRIL Take 1 tablet (10 mg total) by mouth daily.        Objective:   BP (!) 136/91   Pulse  92   Ht 5\' 3"  (1.6 m)   Wt 160 lb (72.6 kg)   LMP 08/10/2016 Comment: [place]  SpO2 95%   BMI 28.34 kg/m   Wt Readings from Last 3 Encounters:  02/19/20 160 lb (72.6 kg)  12/02/19 161 lb (73 kg)  11/04/19 160 lb (72.6 kg)    Physical Exam Vitals and nursing note reviewed.  Constitutional:      General: She is not in acute distress.    Appearance: She is well-developed and well-nourished. She is not diaphoretic.  Eyes:     Extraocular Movements: EOM normal.     Conjunctiva/sclera: Conjunctivae normal.  Neck:     Thyroid: No thyromegaly.  Cardiovascular:     Rate and Rhythm: Normal rate and regular rhythm.     Pulses: Intact distal pulses.     Heart sounds: Normal heart sounds. No murmur heard.   Pulmonary:     Effort: Pulmonary effort is normal. No respiratory distress.     Breath sounds: Normal breath sounds. No wheezing.  Chest:  Breasts: No discharge from either breast. No tenderness and bleeding. Breasts are symmetrical.     Right: No inverted nipple, mass, nipple discharge, skin change or tenderness.     Left: No inverted nipple, mass, nipple discharge, skin change or tenderness.    Abdominal:     General: Bowel sounds are normal. There is no distension.     Palpations: Abdomen is soft.     Tenderness: There is no abdominal tenderness. There is no guarding or rebound.  Genitourinary:    Exam position: Lithotomy position.     Labia:        Right: No rash or lesion.        Left: No rash or lesion.      Vagina: Vaginal discharge (Small amount of vaginal discharge, blind pouch) present. No tenderness.     Uterus: Normal.   Musculoskeletal:        General: No tenderness or edema. Normal range of motion.     Cervical back: Neck supple.  Lymphadenopathy:     Cervical: No cervical adenopathy.     Upper Body:  No axillary adenopathy present. Skin:    General: Skin is warm and dry.     Findings: No rash.  Neurological:     Mental Status: She is alert and oriented to  person, place, and time.     Coordination: Coordination normal.  Psychiatric:        Mood and Affect: Mood and affect normal.        Behavior: Behavior normal.       Assessment & Plan:   Problem List Items Addressed This Visit      Cardiovascular and Mediastinum   Essential hypertension, benign     Digestive   Gastroesophageal reflux disease without esophagitis     Other   Hyperlipidemia LDL goal <100    Other Visit Diagnoses    Well woman exam    -  Primary  Relevant Orders   MM DIAG BREAST TOMO BILATERAL   Encounter for hepatitis C screening test for low risk patient       Encounter for screening mammogram for malignant neoplasm of breast       Relevant Orders   MM DIAG BREAST TOMO BILATERAL      Continue current medication, will continue monitor blood pressure at home, see back in 6 months.  She will schedule for mammogram as well. Follow up plan: Return in about 6 months (around 08/19/2020), or if symptoms worsen or fail to improve, for Hypertension recheck.  Counseling provided for all of the vaccine components No orders of the defined types were placed in this encounter.   Caryl Pina, MD [facility] 02/19/2020, 2:22 PM

## 2020-03-23 ENCOUNTER — Other Ambulatory Visit: Payer: Self-pay | Admitting: Family Medicine

## 2020-03-23 DIAGNOSIS — Z1231 Encounter for screening mammogram for malignant neoplasm of breast: Secondary | ICD-10-CM

## 2020-06-06 ENCOUNTER — Encounter: Payer: Self-pay | Admitting: Family Medicine

## 2020-06-06 ENCOUNTER — Ambulatory Visit: Payer: Medicaid Other | Admitting: Family Medicine

## 2020-06-06 DIAGNOSIS — R0981 Nasal congestion: Secondary | ICD-10-CM | POA: Diagnosis not present

## 2020-06-06 MED ORDER — PREDNISONE 20 MG PO TABS: ORAL_TABLET | ORAL | 0 refills | Status: DC

## 2020-06-06 MED ORDER — FLUTICASONE PROPIONATE 50 MCG/ACT NA SUSP: 1.0000 | Freq: Two times a day (BID) | NASAL | 6 refills | Status: DC | PRN

## 2020-06-06 NOTE — Progress Notes (Signed)
Virtual Visit via telephone Note  I connected with Stacy Wolf on 06/06/20 at 1608 by telephone and verified that I am speaking with the correct person using two identifiers. Stacy Wolf is currently located at home and patient are currently with her during visit. The provider, Fransisca Kaufmann Abbey Veith, MD is located in their office at time of visit.  Call ended at 1615  I discussed the limitations, risks, security and privacy concerns of performing an evaluation and management service by telephone and the availability of in person appointments. I also discussed with the patient that there may be a patient responsible charge related to this service. The patient expressed understanding and agreed to proceed.   History and Present Illness: Patient is having 3 days of eye irritation.  She is using claritin and is having eye drainage.  She is very congestion and difficulty breathing at night. She did a covid test and it was negative.  She is using benadryl and claritin without help.  She has a lot of sinus pressure. She has clear drainage.  She not sleeping well.   No diagnosis found.  Outpatient Encounter Medications as of 06/06/2020  Medication Sig  . amLODipine (NORVASC) 5 MG tablet Take 1 tablet (5 mg total) by mouth daily.  Marland Kitchen aspirin-acetaminophen-caffeine (EXCEDRIN MIGRAINE) 250-250-65 MG tablet Take 2 tablets by mouth every 6 (six) hours as needed for headache or migraine.   . busPIRone (BUSPAR) 15 MG tablet TAKE 1 TABLET (15 MG TOTAL) BY MOUTH 2 (TWO) TIMES DAILY.  Marland Kitchen estradiol (ESTRACE) 2 MG tablet TAKE 1 TABLET (2 MG TOTAL) BY MOUTH AT BEDTIME.  Marland Kitchen lisinopril (ZESTRIL) 10 MG tablet Take 1 tablet (10 mg total) by mouth daily.   No facility-administered encounter medications on file as of 06/06/2020.    Review of Systems  Constitutional: Negative for chills and fever.  HENT: Positive for congestion, postnasal drip, rhinorrhea, sinus pressure, sneezing and sore throat. Negative for ear  discharge and ear pain.   Eyes: Negative for pain, redness and visual disturbance.  Respiratory: Positive for cough. Negative for chest tightness and shortness of breath.   Cardiovascular: Negative for chest pain and leg swelling.  Genitourinary: Negative for difficulty urinating and dysuria.  Musculoskeletal: Negative for back pain and gait problem.  Skin: Negative for rash.  Neurological: Negative for light-headedness and headaches.  Psychiatric/Behavioral: Negative for agitation and behavioral problems.  All other systems reviewed and are negative.   Observations/Objective: Patient sounds comfortable and in no acute distress  Assessment and Plan: Problem List Items Addressed This Visit   None   Visit Diagnoses    Sinus congestion    -  Primary   Relevant Medications   fluticasone (FLONASE) 50 MCG/ACT nasal spray   predniSONE (DELTASONE) 20 MG tablet      Sounds more allergic than infectious, will treat like allergic rhinitis that is severe, doing allergy medicine at home but not improved so we will try the prednisone and Flonase. Follow up plan: Return if symptoms worsen or fail to improve.     I discussed the assessment and treatment plan with the patient. The patient was provided an opportunity to ask questions and all were answered. The patient agreed with the plan and demonstrated an understanding of the instructions.   The patient was advised to call back or seek an in-person evaluation if the symptoms worsen or if the condition fails to improve as anticipated.  The above assessment and management plan was discussed with  the patient. The patient verbalized understanding of and has agreed to the management plan. Patient is aware to call the clinic if symptoms persist or worsen. Patient is aware when to return to the clinic for a follow-up visit. Patient educated on when it is appropriate to go to the emergency department.    I provided 7 minutes of non-face-to-face time  during this encounter.    Worthy Rancher, MD

## 2020-08-19 ENCOUNTER — Ambulatory Visit: Payer: Medicaid Other | Admitting: Family Medicine

## 2020-08-19 ENCOUNTER — Encounter: Payer: Self-pay | Admitting: Family Medicine

## 2020-08-19 ENCOUNTER — Other Ambulatory Visit: Payer: Self-pay

## 2020-08-19 VITALS — BP 119/79 | HR 73 | Ht 63.0 in | Wt 173.0 lb

## 2020-08-19 DIAGNOSIS — I10 Essential (primary) hypertension: Secondary | ICD-10-CM | POA: Diagnosis not present

## 2020-08-19 DIAGNOSIS — Z90711 Acquired absence of uterus with remaining cervical stump: Secondary | ICD-10-CM | POA: Insufficient documentation

## 2020-08-19 DIAGNOSIS — Z1211 Encounter for screening for malignant neoplasm of colon: Secondary | ICD-10-CM

## 2020-08-19 DIAGNOSIS — K219 Gastro-esophageal reflux disease without esophagitis: Secondary | ICD-10-CM

## 2020-08-19 DIAGNOSIS — E785 Hyperlipidemia, unspecified: Secondary | ICD-10-CM

## 2020-08-19 DIAGNOSIS — N946 Dysmenorrhea, unspecified: Secondary | ICD-10-CM

## 2020-08-19 MED ORDER — LISINOPRIL 5 MG PO TABS: 5.0000 mg | ORAL_TABLET | Freq: Every day | ORAL | 3 refills | Status: DC

## 2020-08-19 MED ORDER — NORETHINDRONE 0.35 MG PO TABS: 1.0000 | ORAL_TABLET | Freq: Every day | ORAL | 11 refills | Status: DC

## 2020-08-19 MED ORDER — ESTRADIOL 2 MG PO TABS: 2.0000 mg | ORAL_TABLET | Freq: Every day | ORAL | 4 refills | Status: DC

## 2020-08-19 MED ORDER — AMLODIPINE BESYLATE 5 MG PO TABS: 5.0000 mg | ORAL_TABLET | Freq: Every day | ORAL | 3 refills | Status: DC

## 2020-08-19 NOTE — Progress Notes (Signed)
BP 119/79   Pulse 73   Ht '5\' 3"'  (1.6 m)   Wt 173 lb (78.5 kg)   LMP 08/10/2016 Comment: [place]  SpO2 100%   BMI 30.65 kg/m    Subjective:   Patient ID: Stacy Wolf, female    DOB: 06-28-1975, 45 y.o.   MRN: [id number]  HPI: Stacy Wolf is a 45 y.o. female presenting on 08/19/2020 for Medical Management of Chronic Issues, Hyperlipidemia, and Hypertension   HPI Hypertension Patient is currently on amlodipine and lisinopril, and their blood pressure today is 119/79. Patient denies any lightheadedness or dizziness. Patient denies headaches, blurred vision, chest pains, shortness of breath, or weakness. Denies any side effects from medication and is content with current medication.   Hyperlipidemia Patient is coming in for recheck of his hyperlipidemia. The patient is currently taking no medication currently. They deny any issues with myalgias or history of liver damage from it. They deny any focal numbness or weakness or chest pain.   GERD Patient is currently on nothing currently, has been doing well.  She denies any major symptoms or abdominal pain or belching or burping. She denies any blood in her stool or lightheadedness or dizziness.   Patient comes in complaining of still having menstrual cycles cramping in her left lower abdomen, it happens every month around where her cycle would be but she is having a hysterectomy and they removed her uterus but she is still having it.  Relevant past medical, surgical, family and social history reviewed and updated as indicated. Interim medical history since our last visit reviewed. Allergies and medications reviewed and updated.  Review of Systems  Constitutional:  Negative for chills and fever.  HENT:  Negative for congestion, ear discharge and ear pain.   Eyes:  Negative for redness and visual disturbance.  Respiratory:  Negative for chest tightness and shortness of breath.   Cardiovascular:  Negative for chest pain and leg swelling.   Gastrointestinal:  Positive for abdominal pain. Negative for abdominal distention, constipation, diarrhea, nausea and vomiting.  Genitourinary:  Positive for menstrual problem. Negative for difficulty urinating and dysuria.  Musculoskeletal:  Negative for back pain and gait problem.  Skin:  Negative for rash.  Neurological:  Negative for light-headedness and headaches.  Psychiatric/Behavioral:  Negative for agitation and behavioral problems.   All other systems reviewed and are negative.  Per HPI unless specifically indicated above   Allergies as of 08/19/2020       Reactions   Imitrex [sumatriptan] Anaphylaxis, Other (See Comments)   Could not swallow after taking med   Latuda [lurasidone Hcl] Other (See Comments)   Suicidal thoughts   Wellbutrin [bupropion] Other (See Comments)   Seizures / lowered seizure threshold   Zoloft [sertraline Hcl] Hives   Haloperidol And Related Other (See Comments)   Leg cramps   Nicoderm [nicotine] Dermatitis        Medication List        Accurate as of August 19, 2020  8:29 AM. If you have any questions, ask your nurse or doctor.          amLODipine 5 MG tablet Commonly known as: NORVASC Take 1 tablet (5 mg total) by mouth daily.   aspirin-acetaminophen-caffeine 250-250-65 MG tablet Commonly known as: EXCEDRIN MIGRAINE Take 2 tablets by mouth every 6 (six) hours as needed for headache or migraine.   busPIRone 15 MG tablet Commonly known as: BUSPAR TAKE 1 TABLET (15 MG TOTAL) BY MOUTH 2 (TWO) TIMES  DAILY.   estradiol 2 MG tablet Commonly known as: ESTRACE TAKE 1 TABLET (2 MG TOTAL) BY MOUTH AT BEDTIME.   fluticasone 50 MCG/ACT nasal spray Commonly known as: FLONASE Place 1 spray into both nostrils 2 (two) times daily as needed for allergies or rhinitis.   lisinopril 10 MG tablet Commonly known as: ZESTRIL Take 1 tablet (10 mg total) by mouth daily.   predniSONE 20 MG tablet Commonly known as: DELTASONE 2 po at same time  daily for 5 days         Objective:   BP 119/79   Pulse 73   Ht '5\' 3"'  (1.6 m)   Wt 173 lb (78.5 kg)   LMP 08/10/2016 Comment: [place]  SpO2 100%   BMI 30.65 kg/m   Wt Readings from Last 3 Encounters:  08/19/20 173 lb (78.5 kg)  02/19/20 160 lb (72.6 kg)  12/02/19 161 lb (73 kg)    Physical Exam Vitals and nursing note reviewed.  Constitutional:      General: She is not in acute distress.    Appearance: She is well-developed. She is not diaphoretic.  Eyes:     Conjunctiva/sclera: Conjunctivae normal.  Cardiovascular:     Rate and Rhythm: Normal rate and regular rhythm.     Heart sounds: Normal heart sounds. No murmur heard. Pulmonary:     Effort: Pulmonary effort is normal. No respiratory distress.     Breath sounds: Normal breath sounds. No wheezing.  Abdominal:     General: Abdomen is flat. Bowel sounds are normal. There is no distension.     Tenderness: There is no abdominal tenderness. There is no guarding or rebound.  Musculoskeletal:        General: No tenderness. Normal range of motion.  Skin:    General: Skin is warm and dry.     Findings: No rash.  Neurological:     Mental Status: She is alert and oriented to person, place, and time.     Coordination: Coordination normal.  Psychiatric:        Behavior: Behavior normal.      Assessment & Plan:   Problem List Items Addressed This Visit       Cardiovascular and Mediastinum   Essential hypertension, benign   Relevant Medications   amLODipine (NORVASC) 5 MG tablet   lisinopril (ZESTRIL) 5 MG tablet     Digestive   Gastroesophageal reflux disease without esophagitis   Relevant Orders   CBC with Differential/Platelet     Other   Hyperlipidemia LDL goal <100 - Primary   Relevant Medications   amLODipine (NORVASC) 5 MG tablet   lisinopril (ZESTRIL) 5 MG tablet   Other Relevant Orders   CMP14+EGFR   Lipid panel   Status post partial hysterectomy   Other Visit Diagnoses     Colon cancer  screening       Relevant Orders   Ambulatory referral to Gastroenterology   Menstrual cramps       Relevant Medications   estradiol (ESTRACE) 2 MG tablet   norethindrone (ORTHO MICRONOR) 0.35 MG tablet     Blood pressure looking great, reduce lisinopril to 5 mg daily  Patient still have menstrual cramping despite not having a uterus and taking estrogen supplement, will try and add progesterone to estrogen regimen and see if that suppresses things.  Follow up plan: Return in about 6 months (around 02/18/2021), or if symptoms worsen or fail to improve, for Hypertension and cholesterol.  Counseling provided for all  of the vaccine components No orders of the defined types were placed in this encounter.   Caryl Pina, MD [facility] 08/19/2020, 8:29 AM

## 2020-10-07 ENCOUNTER — Other Ambulatory Visit: Payer: Self-pay

## 2020-10-07 ENCOUNTER — Encounter: Payer: Self-pay | Admitting: Family Medicine

## 2020-10-07 ENCOUNTER — Ambulatory Visit: Payer: Medicaid Other | Admitting: Family Medicine

## 2020-10-07 VITALS — BP 134/65 | HR 62 | Temp 97.9°F | Ht 63.0 in | Wt 172.6 lb

## 2020-10-07 DIAGNOSIS — M62830 Muscle spasm of back: Secondary | ICD-10-CM | POA: Diagnosis not present

## 2020-10-07 DIAGNOSIS — M545 Low back pain, unspecified: Secondary | ICD-10-CM | POA: Diagnosis not present

## 2020-10-07 DIAGNOSIS — M79605 Pain in left leg: Secondary | ICD-10-CM | POA: Diagnosis not present

## 2020-10-07 DIAGNOSIS — M5432 Sciatica, left side: Secondary | ICD-10-CM | POA: Diagnosis not present

## 2020-10-07 MED ORDER — METHYLPREDNISOLONE ACETATE 40 MG/ML IJ SUSP
60.0000 mg | Freq: Once | INTRAMUSCULAR | Status: AC
Start: 2020-10-07 — End: 2020-10-07
  Administered 2020-10-07: 60 mg via INTRAMUSCULAR

## 2020-10-07 MED ORDER — CYCLOBENZAPRINE HCL 10 MG PO TABS
10.0000 mg | ORAL_TABLET | Freq: Three times a day (TID) | ORAL | 1 refills | Status: DC | PRN
Start: 2020-10-07 — End: 2021-07-27

## 2020-10-07 NOTE — Progress Notes (Signed)
Subjective:  Patient ID: Stacy Wolf, female    DOB: 1975-07-18, 45 y.o.   MRN: [id number]  Patient Care Team: Dettinger, Fransisca Kaufmann, MD as PCP - General (Family Medicine)   Chief Complaint:  Back Pain   HPI: Stacy Wolf is a 45 y.o. female presenting on 10/07/2020 for Back Pain   Pt presents with back pain after getting out of bed. No known injury. No red flags.   Back Pain This is a new problem. The current episode started in the past 7 days. The problem occurs intermittently. The problem has been waxing and waning since onset. The pain is present in the gluteal and lumbar spine. The quality of the pain is described as aching, shooting, cramping and stabbing. The pain radiates to the left thigh and left knee. The pain is at a severity of 6/10. The pain is moderate. The symptoms are aggravated by bending, position, standing and twisting. Associated symptoms include leg pain and tingling. Pertinent negatives include no abdominal pain, bladder incontinence, bowel incontinence, chest pain, dysuria, fever, headaches, numbness, paresis, paresthesias, pelvic pain, perianal numbness, weakness or weight loss. She has tried heat and bed rest for the symptoms. The treatment provided no relief.     Relevant past medical, surgical, family, and social history reviewed and updated as indicated.  Allergies and medications reviewed and updated. Date reviewed: Chart in Epic.   Past Medical History:  Diagnosis Date   Anxiety    Bipolar 1 disorder ([place])    Heart murmur    Migraines    Schizophrenia ([place])     Past Surgical History:  Procedure Laterality Date   arm surgery Right    tendonitis-wrist   BILATERAL SALPINGECTOMY Bilateral 08/29/2016   Procedure: BILATERAL SALPINGECTOMY;  Surgeon: Florian Buff, MD;  Location: [facility];  Service: Gynecology;  Laterality: Bilateral;   CARPAL TUNNEL RELEASE Left 04/11/2016   Procedure: CARPAL TUNNEL RELEASE;  Surgeon: [name], MD;   Location: [facility];  Service: Orthopedics;  Laterality: Left;   CARPAL TUNNEL RELEASE Right 03/13/2018   Procedure: RIGHT CARPAL TUNNEL RELEASE;  Surgeon: [name], MD;  Location: [facility];  Service: Orthopedics;  Laterality: Right;   SUPRACERVICAL ABDOMINAL HYSTERECTOMY N/A 08/29/2016   Procedure: HYSTERECTOMY SUPRACERVICAL ABDOMINAL;  Surgeon: Florian Buff, MD;  Location: [facility];  Service: Gynecology;  Laterality: N/A;   TRIGGER FINGER RELEASE Right 11/18/2018   Procedure: RELEASE TRIGGER FINGER/A-1 PULLEY right ring;  Surgeon: [name], MD;  Location: [facility];  Service: Orthopedics;  Laterality: Right;   TUBAL LIGATION      Social History   Socioeconomic History   Marital status: Divorced    Spouse name: Not on file   Number of children: 3   Years of education: Not on file   Highest education level: 11th grade  Occupational History   Not on file  Tobacco Use   Smoking status: Every Day    Packs/day: 0.00    Years: 7.00    Pack years: 0.00    Types: Cigars, Cigarettes    Last attempt to quit: 11/05/2017    Years since quitting: 2.9   Smokeless tobacco: Never   Tobacco comments:    1 cigar per day  Vaping Use   Vaping Use: Never used  Substance and Sexual Activity   Alcohol use: Not Currently    Comment: last used 10 days ago   Drug use: Not Currently    Types:  Marijuana   Sexual activity: Yes    Birth control/protection: Surgical    Comment: supracervical hyst  Other Topics Concern   Not on file  Social History Narrative   Not on file   Social Determinants of Health   Financial Resource Strain: Not on file  Food Insecurity: Not on file  Transportation Needs: Not on file  Physical Activity: Not on file  Stress: Not on file  Social Connections: Not on file  Intimate Partner Violence: Not on file    Outpatient Encounter Medications as of 10/07/2020  Medication Sig   amLODipine (NORVASC) 5 MG tablet Take 1 tablet (5 mg total) by mouth daily.    aspirin-acetaminophen-caffeine (EXCEDRIN MIGRAINE) 250-250-65 MG tablet Take 2 tablets by mouth every 6 (six) hours as needed for headache or migraine.    busPIRone (BUSPAR) 15 MG tablet TAKE 1 TABLET (15 MG TOTAL) BY MOUTH 2 (TWO) TIMES DAILY.   cyclobenzaprine (FLEXERIL) 10 MG tablet Take 1 tablet (10 mg total) by mouth 3 (three) times daily as needed for muscle spasms.   estradiol (ESTRACE) 2 MG tablet Take 1 tablet (2 mg total) by mouth at bedtime.   fluticasone (FLONASE) 50 MCG/ACT nasal spray Place 1 spray into both nostrils 2 (two) times daily as needed for allergies or rhinitis.   lisinopril (ZESTRIL) 5 MG tablet Take 1 tablet (5 mg total) by mouth daily.   norethindrone (ORTHO MICRONOR) 0.35 MG tablet Take 1 tablet (0.35 mg total) by mouth daily.   [EXPIRED] methylPREDNISolone acetate (DEPO-MEDROL) injection 60 mg    No facility-administered encounter medications on file as of 10/07/2020.    Allergies  Allergen Reactions   Imitrex [Sumatriptan] Anaphylaxis and Other (See Comments)    Could not swallow after taking med   Latuda [Lurasidone Hcl] Other (See Comments)    Suicidal thoughts    Wellbutrin [Bupropion] Other (See Comments)    Seizures / lowered seizure threshold   Zoloft [Sertraline Hcl] Hives   Haloperidol And Related Other (See Comments)    Leg cramps   Nicoderm [Nicotine] Dermatitis    Review of Systems  Constitutional:  Negative for fever and weight loss.  Cardiovascular:  Negative for chest pain.  Gastrointestinal:  Negative for abdominal pain and bowel incontinence.  Genitourinary:  Negative for bladder incontinence, difficulty urinating, dysuria and pelvic pain.  Musculoskeletal:  Positive for arthralgias, back pain and myalgias. Negative for gait problem, joint swelling, neck pain and neck stiffness.  Skin: Negative.   Neurological:  Positive for tingling. Negative for dizziness, tremors, seizures, syncope, facial asymmetry, speech difficulty, weakness,  light-headedness, numbness, headaches and paresthesias.  All other systems reviewed and are negative.      Objective:  BP 134/65   Pulse 62   Temp 97.9 F (36.6 C) (Temporal)   Ht '5\' 3"'$  (1.6 m)   Wt 172 lb 9.6 oz (78.3 kg)   LMP 08/10/2016 Comment: [place]  BMI 30.57 kg/m    Wt Readings from Last 3 Encounters:  10/07/20 172 lb 9.6 oz (78.3 kg)  08/19/20 173 lb (78.5 kg)  02/19/20 160 lb (72.6 kg)    Physical Exam Vitals and nursing note reviewed.  Constitutional:      General: She is not in acute distress.    Appearance: Normal appearance. She is well-developed and well-groomed. She is obese. She is not ill-appearing, toxic-appearing or diaphoretic.  HENT:     Head: Normocephalic and atraumatic.     Jaw: There is normal jaw occlusion.  Right Ear: Hearing normal.     Left Ear: Hearing normal.     Nose: Nose normal.     Mouth/Throat:     Lips: Pink.     Mouth: Mucous membranes are moist.     Pharynx: Oropharynx is clear. Uvula midline.  Eyes:     General: Lids are normal.     Extraocular Movements: Extraocular movements intact.     Conjunctiva/sclera: Conjunctivae normal.     Pupils: Pupils are equal, round, and reactive to light.  Neck:     Thyroid: No thyroid mass, thyromegaly or thyroid tenderness.     Vascular: No carotid bruit or JVD.     Trachea: Trachea and phonation normal.  Cardiovascular:     Rate and Rhythm: Normal rate and regular rhythm.     Chest Wall: PMI is not displaced.     Pulses: Normal pulses.     Heart sounds: Normal heart sounds. No murmur heard.   No friction rub. No gallop.  Pulmonary:     Effort: Pulmonary effort is normal. No respiratory distress.     Breath sounds: Normal breath sounds. No wheezing.  Abdominal:     General: Bowel sounds are normal. There is no distension or abdominal bruit.     Palpations: Abdomen is soft. There is no hepatomegaly or splenomegaly.     Tenderness: There is no abdominal tenderness. There is no right CVA  tenderness or left CVA tenderness.     Hernia: No hernia is present.  Musculoskeletal:     Cervical back: Normal, normal range of motion and neck supple.     Thoracic back: Normal.     Lumbar back: Spasms and tenderness (left lower back) present. No swelling, edema, deformity, signs of trauma, lacerations or bony tenderness. Decreased range of motion (due to pain). Positive left straight leg raise test. Negative right straight leg raise test. No scoliosis.     Right lower leg: No edema.     Left lower leg: No edema.  Lymphadenopathy:     Cervical: No cervical adenopathy.  Skin:    General: Skin is warm and dry.     Capillary Refill: Capillary refill takes less than 2 seconds.     Coloration: Skin is not cyanotic, jaundiced or pale.     Findings: No rash.  Neurological:     General: No focal deficit present.     Mental Status: She is alert and oriented to person, place, and time.     Cranial Nerves: Cranial nerves are intact.     Sensory: Sensation is intact.     Motor: Motor function is intact.     Coordination: Coordination is intact.     Gait: Gait is intact.     Deep Tendon Reflexes: Reflexes are normal and symmetric.  Psychiatric:        Attention and Perception: Attention and perception normal.        Mood and Affect: Mood and affect normal.        Speech: Speech normal.        Behavior: Behavior normal. Behavior is cooperative.        Thought Content: Thought content normal.        Cognition and Memory: Cognition and memory normal.        Judgment: Judgment normal.    Results for orders placed or performed in visit on 12/02/19  Cervicovaginal ancillary only  Result Value Ref Range   Bacterial Vaginitis (gardnerella) Positive (A)  Candida Vaginitis Positive (A)    Candida Glabrata Negative    Comment Normal Reference Range Candida Species - Negative    Comment Normal Reference Range Candida Galbrata - Negative    Comment      Normal Reference Range Bacterial  Vaginosis - Negative       Pertinent labs & imaging results that were available during my care of the patient were reviewed by me and considered in my medical decision making.  Assessment & Plan:  Luellar was seen today for back pain.  Diagnoses and all orders for this visit:  Low back pain radiating to left lower extremity Sciatica of left side Low back pain radiating to LLE with positive left SLR test. Will burst with steroids as pt has HTN and unable to take NSAIDs. Continue tylenol. Symptomatic care discussed in detail. Report any new or worsening symptoms. No red flags present.  -     methylPREDNISolone acetate (DEPO-MEDROL) injection 60 mg  Paraspinal muscle spasm Symptomatic care discussed in detail. Medications as prescribed. Report any new or worsening symptoms.  -     cyclobenzaprine (FLEXERIL) 10 MG tablet; Take 1 tablet (10 mg total) by mouth 3 (three) times daily as needed for muscle spasms.    Continue all other maintenance medications.  Follow up plan: Return if symptoms worsen or fail to improve.   Continue healthy lifestyle choices, including diet (rich in fruits, vegetables, and lean proteins, and low in salt and simple carbohydrates) and exercise (at least 30 minutes of moderate physical activity daily).  Educational handout given for sciatica, muscle spasm  The above assessment and management plan was discussed with the patient. The patient verbalized understanding of and has agreed to the management plan. Patient is aware to call the clinic if they develop any new symptoms or if symptoms persist or worsen. Patient is aware when to return to the clinic for a follow-up visit. Patient educated on when it is appropriate to go to the emergency department.   Monia Pouch, FNP-C [facility] [phone]

## 2020-11-14 ENCOUNTER — Other Ambulatory Visit: Payer: Self-pay

## 2020-11-14 ENCOUNTER — Ambulatory Visit
Admission: RE | Admit: 2020-11-14 | Discharge: 2020-11-14 | Disposition: A | Discharge status: Home / Self Care | Payer: Medicaid Other | Source: Ambulatory Visit | Attending: Family Medicine | Admitting: Family Medicine

## 2020-11-14 DIAGNOSIS — Z1231 Encounter for screening mammogram for malignant neoplasm of breast: Secondary | ICD-10-CM

## 2020-11-17 ENCOUNTER — Encounter (HOSPITAL_COMMUNITY): Payer: Self-pay | Admitting: Emergency Medicine

## 2020-11-17 ENCOUNTER — Other Ambulatory Visit: Payer: Self-pay

## 2020-11-17 ENCOUNTER — Emergency Department (HOSPITAL_COMMUNITY)
Admission: EM | Admit: 2020-11-17 | Discharge: 2020-11-17 | Disposition: A | Discharge status: Home / Self Care | Payer: Medicaid Other | Attending: Emergency Medicine | Admitting: Emergency Medicine

## 2020-11-17 DIAGNOSIS — Z5321 Procedure and treatment not carried out due to patient leaving prior to being seen by health care provider: Secondary | ICD-10-CM | POA: Diagnosis not present

## 2020-11-17 DIAGNOSIS — R109 Unspecified abdominal pain: Secondary | ICD-10-CM | POA: Diagnosis present

## 2020-11-17 NOTE — ED Triage Notes (Signed)
Pt c/o right flank pain that started tonight while at work.

## 2020-11-18 ENCOUNTER — Encounter: Payer: Self-pay | Admitting: Family

## 2020-11-18 ENCOUNTER — Ambulatory Visit: Payer: Medicaid Other | Admitting: Family

## 2020-11-18 ENCOUNTER — Ambulatory Visit (INDEPENDENT_AMBULATORY_CARE_PROVIDER_SITE_OTHER): Payer: Medicaid Other

## 2020-11-18 VITALS — BP 136/89 | HR 72 | Temp 97.3°F | Resp 20 | Ht 63.0 in | Wt 171.0 lb

## 2020-11-18 DIAGNOSIS — N12 Tubulo-interstitial nephritis, not specified as acute or chronic: Secondary | ICD-10-CM | POA: Diagnosis not present

## 2020-11-18 DIAGNOSIS — Z720 Tobacco use: Secondary | ICD-10-CM | POA: Diagnosis not present

## 2020-11-18 DIAGNOSIS — R109 Unspecified abdominal pain: Secondary | ICD-10-CM

## 2020-11-18 LAB — URINALYSIS, COMPLETE
Bilirubin, UA: NEGATIVE
Glucose, UA: NEGATIVE
Leukocytes,UA: NEGATIVE
Nitrite, UA: NEGATIVE
Specific Gravity, UA: 1.02 (ref 1.005–1.030)
Urobilinogen, Ur: 1 mg/dL (ref 0.2–1.0)
pH, UA: 7 (ref 5.0–7.5)

## 2020-11-18 LAB — MICROSCOPIC EXAMINATION
RBC, Urine: NONE SEEN /hpf (ref 0–2)
Renal Epithel, UA: NONE SEEN /hpf

## 2020-11-18 MED ORDER — CIPROFLOXACIN HCL 500 MG PO TABS: 500.0000 mg | ORAL_TABLET | Freq: Two times a day (BID) | ORAL | 0 refills | Status: DC

## 2020-11-18 NOTE — Patient Instructions (Signed)
Pyelonephritis, Adult Pyelonephritis is an infection that occurs in the kidney. The kidneys are the organs that filter a person's blood and move waste out of the bloodstream and into the urine. Urine passes from the kidneys, through tubes called ureters, and into the bladder. There are two main types of pyelonephritis: Infections that come on quickly without any warning (acute pyelonephritis). Infections that last for a long period of time (chronic pyelonephritis). In most cases, the infection clears up with treatment and does not cause further problems. More severe infections or chronic infections can sometimes spread to the bloodstream or lead to other problems with the kidneys. What are the causes? This condition is usually caused by: Bacteria traveling from the bladder up to the kidney. This may occur after having a bladder infection (cystitis) or urinary tract infection (UTI). Bladder infections caused from bacteria traveling from the bloodstream to the kidney. What increases the risk? This condition is more likely to develop in: Pregnant women. Older people. People who have any of these conditions: Diabetes. Inflammation of the prostate gland (prostatitis), in males. Kidney stones or bladder stones. Other abnormalities of the kidney or ureter. Cancer. People who have a catheter placed in the bladder. People who are sexually active. Women who use spermicides. People who have had a prior UTI. What are the signs or symptoms? Symptoms of this condition include: Frequent urination. Strong or persistent urge to urinate. Burning or stinging when urinating. Abdominal pain. Back pain. Pain in the side or flank area. Fever or chills. Blood in the urine, or dark urine. Nausea or vomiting. How is this diagnosed? This condition may be diagnosed based on: Your medical history and a physical exam. Urine tests. Blood tests. You may also have imaging tests of the kidneys, such as an  ultrasound or CT scan. How is this treated? Treatment for this condition may depend on the severity of the infection. If the infection is mild and is found early, you may be treated with antibiotic medicines taken by mouth (orally). You will need to drink fluids to remain hydrated. If the infection is more severe, you may need to stay in the hospital and receive antibiotics given directly into a vein through an IV. You may also need to receive fluids through an IV if you are not able to remain hydrated. After your hospital stay, you may need to take oral antibiotics for a period of time. Other treatments may be required, depending on the cause of the infection. Follow these instructions at home: Medicines Take your antibiotic medicine as told by your health care provider. Do not stop taking the antibiotic even if you start to feel better. Take over-the-counter and prescription medicines only as told by your health care provider. General instructions  Drink enough fluid to keep your urine pale yellow. Avoid caffeine, tea, and carbonated beverages. They tend to irritate the bladder. Urinate often. Avoid holding in urine for long periods of time. Urinate before and after sex. After a bowel movement, women should cleanse from front to back. Use each tissue only once. Keep all follow-up visits as told by your health care provider. This is important. Contact a health care provider if: Your symptoms do not get better after 2 days of treatment. Your symptoms get worse. You have a fever. Get help right away if you: Are unable to take your antibiotics or fluids. Have shaking chills. Vomit. Have severe flank or back pain. Have extreme weakness or fainting. Summary Pyelonephritis is a urinary tract infection (  UTI) that occurs in the kidney. Treatment for this condition may depend on the severity of the infection. Take your antibiotic medicine as told by your health care provider. Do not stop  taking the antibiotic even if you start to feel better. Drink enough fluid to keep your urine pale yellow. Keep all follow-up visits as told by your health care provider. This is important. This information is not intended to replace advice given to you by your health care provider. Make sure you discuss any questions you have with your health care provider. Document Revised: 12/24/2017 Document Reviewed: 12/24/2017 Elsevier Patient Education  2022 [employer].

## 2020-11-18 NOTE — Progress Notes (Signed)
Subjective:    Patient ID: Stacy Wolf, female    DOB: Apr 24, 1975, 45 y.o.   MRN: [id number]  Chief Complaint  Patient presents with   Flank Pain   Abdominal Pain   Pt presents to the office today with right flank pain that started suddenly last night at work. She went to the ED, but left because of the wait time.  Flank Pain This is a new problem. The current episode started yesterday. The problem occurs constantly. Pain location: right flank. The quality of the pain is described as stabbing. The pain is at a severity of 9/10. The pain is mild. Associated symptoms include abdominal pain. Pertinent negatives include no dysuria or fever.  Abdominal Pain This is a new problem. The current episode started yesterday. The onset quality is sudden. The pain is located in the right flank. The pain is at a severity of 9/10. The quality of the pain is sharp. The abdominal pain does not radiate. Pertinent negatives include no dysuria, fever, frequency, hematuria, nausea or vomiting. The pain is aggravated by certain positions. The pain is relieved by Bowel movements. She has tried acetaminophen for the symptoms. The treatment provided mild relief.     Review of Systems  Constitutional:  Negative for fever.  Gastrointestinal:  Positive for abdominal pain. Negative for nausea and vomiting.  Genitourinary:  Positive for flank pain. Negative for dysuria, frequency and hematuria.  All other systems reviewed and are negative.     Objective:   Physical Exam Vitals reviewed.  Constitutional:      General: She is not in acute distress.    Appearance: She is well-developed.  HENT:     Head: Normocephalic and atraumatic.     Right Ear: External ear normal.  Eyes:     Pupils: Pupils are equal, round, and reactive to light.  Neck:     Thyroid: No thyromegaly.  Cardiovascular:     Rate and Rhythm: Normal rate and regular rhythm.     Heart sounds: Normal heart sounds. No murmur heard. Pulmonary:      Effort: Pulmonary effort is normal. No respiratory distress.     Breath sounds: Normal breath sounds. No wheezing.  Abdominal:     General: Bowel sounds are normal. There is no distension.     Palpations: Abdomen is soft.     Tenderness: There is abdominal tenderness in the suprapubic area. There is right CVA tenderness.  Musculoskeletal:        General: No tenderness. Normal range of motion.     Cervical back: Normal range of motion and neck supple.  Skin:    General: Skin is warm and dry.  Neurological:     Mental Status: She is alert and oriented to person, place, and time.     Cranial Nerves: No cranial nerve deficit.     Deep Tendon Reflexes: Reflexes are normal and symmetric.  Psychiatric:        Behavior: Behavior normal.        Thought Content: Thought content normal.        Judgment: Judgment normal.      BP 136/89   Pulse 72   Temp (!) 97.3 F (36.3 C) (Temporal)   Resp 20   Ht '5\' 3"'$  (1.6 m)   Wt 171 lb (77.6 kg)   LMP 08/10/2016 Comment: [place]  SpO2 97%   BMI 30.29 kg/m      Assessment & Plan:  Stacy Wolf comes in today  with chief complaint of Flank Pain and Abdominal Pain   Diagnosis and orders addressed:  1. Flank pain - Urinalysis, Complete - DG Abd 1 View - Urine Culture  2. Tobacco abuse  - Urine Culture  3. Pyelonephritis Force fluids Start Cipro today If symptoms worsen return to office or go to Urgent care. Stable at this time.  - Urine Culture - ciprofloxacin (CIPRO) 500 MG tablet; Take 1 tablet (500 mg total) by mouth 2 (two) times daily.  Dispense: 14 tablet; Refill: 0     Evelina Dun, FNP

## 2020-11-22 LAB — URINE CULTURE

## 2020-11-23 NOTE — Progress Notes (Signed)
Pt calling back about lab results

## 2020-11-25 ENCOUNTER — Ambulatory Visit: Payer: Medicaid Other | Admitting: Family Medicine

## 2020-11-25 ENCOUNTER — Encounter: Payer: Self-pay | Admitting: Family Medicine

## 2020-11-25 ENCOUNTER — Other Ambulatory Visit: Payer: Self-pay

## 2020-11-25 VITALS — BP 129/84 | HR 74 | Ht 63.0 in | Wt 176.0 lb

## 2020-11-25 DIAGNOSIS — H02824 Cysts of left upper eyelid: Secondary | ICD-10-CM

## 2020-11-25 DIAGNOSIS — M13161 Monoarthritis, not elsewhere classified, right knee: Secondary | ICD-10-CM

## 2020-11-25 MED ORDER — NAPROXEN SODIUM 220 MG PO TABS: 440.0000 mg | ORAL_TABLET | Freq: Two times a day (BID) | ORAL | 1 refills | Status: DC | PRN

## 2020-11-25 NOTE — Progress Notes (Signed)
BP 129/84   Pulse 74   Ht 5\' 3"  (1.6 m)   Wt 176 lb (79.8 kg)   LMP 08/10/2016 Comment: [place]  SpO2 100%   BMI 31.18 kg/m    Subjective:   Patient ID: Stacy Wolf, female    DOB: 10-Jun-1975, 45 y.o.   MRN: [id number]  HPI: Stacy Wolf is a 45 y.o. female presenting on 11/25/2020 for Knee Pain (Right, fell 2 months ago. Swollen area) and Eyelid Mass (Left, enlarging)   HPI Patient is coming in complaining of soreness in her right knee that is been present about 2 months.  She does states she had a fall where she landed onto her hands and her knees on the steps going up into her house and her knee has been bothering her off and on and been swollen off and on a little bit since then but recently 2 days ago she was at work and she feels like she wore it out and out swelling and has a bulge on the outside of the knee.  Patient also complains of a little hard nodule on her left upper eyelid that is been there for 2 weeks.  She says its slightly enlarging.  She is not had any drainage or pain with it or swelling or redness or warmth that she has noticed.  She has been doing warm compression and it has not helped.  Relevant past medical, surgical, family and social history reviewed and updated as indicated. Interim medical history since our last visit reviewed. Allergies and medications reviewed and updated.  Review of Systems  Musculoskeletal:  Positive for arthralgias and joint swelling. Negative for gait problem and myalgias.  Skin:  Negative for color change, pallor, rash and wound.   Per HPI unless specifically indicated above   Allergies as of 11/25/2020       Reactions   Imitrex [sumatriptan] Anaphylaxis, Other (See Comments)   Could not swallow after taking med   Latuda [lurasidone Hcl] Other (See Comments)   Suicidal thoughts   Wellbutrin [bupropion] Other (See Comments)   Seizures / lowered seizure threshold   Zoloft [sertraline Hcl] Hives   Haloperidol And Related  Other (See Comments)   Leg cramps   Nicoderm [nicotine] Dermatitis        Medication List        Accurate as of November 25, 2020  9:44 AM. If you have any questions, ask your nurse or doctor.          amLODipine 5 MG tablet Commonly known as: NORVASC Take 1 tablet (5 mg total) by mouth daily.   aspirin-acetaminophen-caffeine 250-250-65 MG tablet Commonly known as: EXCEDRIN MIGRAINE Take 2 tablets by mouth every 6 (six) hours as needed for headache or migraine.   busPIRone 15 MG tablet Commonly known as: BUSPAR TAKE 1 TABLET (15 MG TOTAL) BY MOUTH 2 (TWO) TIMES DAILY.   ciprofloxacin 500 MG tablet Commonly known as: Cipro Take 1 tablet (500 mg total) by mouth 2 (two) times daily.   cyclobenzaprine 10 MG tablet Commonly known as: FLEXERIL Take 1 tablet (10 mg total) by mouth 3 (three) times daily as needed for muscle spasms.   estradiol 2 MG tablet Commonly known as: ESTRACE Take 1 tablet (2 mg total) by mouth at bedtime.   fluticasone 50 MCG/ACT nasal spray Commonly known as: FLONASE Place 1 spray into both nostrils 2 (two) times daily as needed for allergies or rhinitis.   lisinopril 5 MG tablet Commonly  known as: ZESTRIL Take 1 tablet (5 mg total) by mouth daily.   naproxen sodium 220 MG tablet Commonly known as: Aleve Take 2 tablets (440 mg total) by mouth 2 (two) times daily as needed. Started by: Worthy Rancher, MD   norethindrone 0.35 MG tablet Commonly known as: Ortho Micronor Take 1 tablet (0.35 mg total) by mouth daily.         Objective:   BP 129/84   Pulse 74   Ht 5\' 3"  (1.6 m)   Wt 176 lb (79.8 kg)   LMP 08/10/2016 Comment: [place]  SpO2 100%   BMI 31.18 kg/m   Wt Readings from Last 3 Encounters:  11/25/20 176 lb (79.8 kg)  11/18/20 171 lb (77.6 kg)  10/07/20 172 lb 9.6 oz (78.3 kg)    Physical Exam Vitals and nursing note reviewed.  Constitutional:      Appearance: Normal appearance.  Musculoskeletal:     Right knee:  Effusion (Small effusion) present. No bony tenderness or crepitus. Normal range of motion. No tenderness. No LCL laxity, MCL laxity, ACL laxity or PCL laxity.     Instability Tests: Anterior drawer test negative. Posterior drawer test negative. Anterior Lachman test negative. Medial McMurray test negative and lateral McMurray test negative.  Skin:      Neurological:     Mental Status: She is alert.      Assessment & Plan:   Problem List Items Addressed This Visit   None Visit Diagnoses     Inflammation of joint of right knee    -  Primary   Relevant Medications   naproxen sodium (ALEVE) 220 MG tablet   Cyst of left upper eyelid           Likely due to a little arthritis flareup, likely mild, recommended oral anti-inflammatory for 2 weeks and if not improved consider injection in the future. Follow up plan: Return if symptoms worsen or fail to improve.  Counseling provided for all of the vaccine components No orders of the defined types were placed in this encounter.   Caryl Pina, MD [facility] 11/25/2020, 9:44 AM

## 2020-12-06 ENCOUNTER — Telehealth: Payer: Self-pay | Admitting: Family Medicine

## 2020-12-07 ENCOUNTER — Ambulatory Visit (INDEPENDENT_AMBULATORY_CARE_PROVIDER_SITE_OTHER): Payer: Medicaid Other

## 2020-12-07 ENCOUNTER — Other Ambulatory Visit: Payer: Self-pay

## 2020-12-07 ENCOUNTER — Encounter: Payer: Self-pay | Admitting: Family Medicine

## 2020-12-07 ENCOUNTER — Ambulatory Visit: Payer: Medicaid Other | Admitting: Family Medicine

## 2020-12-07 VITALS — BP 118/70 | HR 75 | Temp 97.5°F | Ht 63.0 in | Wt 173.2 lb

## 2020-12-07 DIAGNOSIS — M25561 Pain in right knee: Secondary | ICD-10-CM

## 2020-12-07 MED ORDER — DICLOFENAC SODIUM 75 MG PO TBEC: 75.0000 mg | DELAYED_RELEASE_TABLET | Freq: Two times a day (BID) | ORAL | 0 refills | Status: DC

## 2020-12-07 NOTE — Patient Instructions (Signed)
Acute Knee Pain, Adult Acute knee pain is sudden and may be caused by damage, swelling, or irritation of the muscles and tissues that support the knee. Pain may result from: A fall. An injury to the knee from twisting motions. A hit to the knee. Infection. Acute knee pain may go away on its own with time and rest. If it does not, your health care provider may order tests to find the cause of the pain. These may include: Imaging tests, such as an X-ray, MRI, CT scan, or ultrasound. Joint aspiration. In this test, fluid is removed from the knee and evaluated. Arthroscopy. In this test, a lighted tube is inserted into the knee and an image is projected onto a TV screen. Biopsy. In this test, a sample of tissue is removed from the body and studied under a microscope. Follow these instructions at home: If you have a knee sleeve or brace:  Wear the knee sleeve or brace as told by your health care provider. Remove it only as told by your health care provider. Loosen it if your toes tingle, become numb, or turn cold and blue. Keep it clean. If the knee sleeve or brace is not waterproof: Do not let it get wet. Cover it with a watertight covering when you take a bath or shower.  Activity Rest your knee. Do not do things that cause pain or make pain worse. Avoid high-impact activities or exercises, such as running, jumping rope, or doing jumping jacks. Work with a physical therapist to make a safe exercise program, as recommended by your health care provider. Do exercises as told by your physical therapist. Managing pain, stiffness, and swelling  If directed, put ice on the affected knee. To do this: If you have a removable knee sleeve or brace, remove it as told by your health care provider. Put ice in a plastic bag. Place a towel between your skin and the bag. Leave the ice on for 20 minutes, 2-3 times a day. Remove the ice if your skin turns bright red. This is very important. If you cannot  feel pain, heat, or cold, you have a greater risk of damage to the area. If directed, use an elastic bandage to put pressure (compression) on your injured knee. This may control swelling, give support, and help with discomfort. Raise (elevate) your knee above the level of your heart while you are sitting or lying down. Sleep with a pillow under your knee.  General instructions Take over-the-counter and prescription medicines only as told by your health care provider. Do not use any products that contain nicotine or tobacco, such as cigarettes, e-cigarettes, and chewing tobacco. If you need help quitting, ask your health care provider. If you are overweight, work with your health care provider and a dietitian to set a weight-loss goal that is healthy and reasonable for you. Extra weight can put pressure on your knee. Pay attention to any changes in your symptoms. Keep all follow-up visits. This is important. Contact a health care provider if: Your knee pain continues, changes, or gets worse. You have a fever along with knee pain. Your knee feels warm to the touch or is red. Your knee buckles or locks up. Get help right away if: Your knee swells, and the swelling becomes worse. You cannot move your knee. You have severe pain in your knee that cannot be managed with pain medicine. Summary Acute knee pain can be caused by a fall, an injury, an infection, or damage, swelling,   or irritation of the tissues that support your knee. Your health care provider may perform tests to find out the cause of the pain. Pay attention to any changes in your symptoms. Relieve your pain with rest, medicines, light activity, and the use of ice. Get help right away if your knee swells, you cannot move your knee, or you have severe pain that cannot be managed with medicine. This information is not intended to replace advice given to you by your health care provider. Make sure you discuss any questions you have with  your healthcare provider. Document Revised: 08/05/2019 Document Reviewed: 08/05/2019 Elsevier Patient Education  2022 Elsevier Inc.  

## 2020-12-07 NOTE — Progress Notes (Signed)
Acute Office Visit  Subjective:    Patient ID: Stacy Wolf, female    DOB: [birth year], 45 y.o.   MRN: [id number]  Chief Complaint  Patient presents with   Knee Pain    HPI Patient is in today for acute knee pain x 12 days. This started after a fall. She reports that the pain is worse than when it started. The pain is constant for the past two days. The pain is a 8/10. It is worse with activity. She also reports swelling that has not improvement. She has been taking aleve without improvement. She denies numbness, tingling, catching, clicking, erythema, warmth, or fever.   Past Medical History:  Diagnosis Date   Anxiety    Bipolar 1 disorder ([place])    Heart murmur    Migraines    Schizophrenia ([place])     Past Surgical History:  Procedure Laterality Date   arm surgery Right    tendonitis-wrist   BILATERAL SALPINGECTOMY Bilateral 08/29/2016   Procedure: BILATERAL SALPINGECTOMY;  Surgeon: Florian Buff, MD;  Location: [facility];  Service: Gynecology;  Laterality: Bilateral;   CARPAL TUNNEL RELEASE Left 04/11/2016   Procedure: CARPAL TUNNEL RELEASE;  Surgeon: [name], MD;  Location: [facility];  Service: Orthopedics;  Laterality: Left;   CARPAL TUNNEL RELEASE Right 03/13/2018   Procedure: RIGHT CARPAL TUNNEL RELEASE;  Surgeon: [name], MD;  Location: [facility];  Service: Orthopedics;  Laterality: Right;   SUPRACERVICAL ABDOMINAL HYSTERECTOMY N/A 08/29/2016   Procedure: HYSTERECTOMY SUPRACERVICAL ABDOMINAL;  Surgeon: Florian Buff, MD;  Location: [facility];  Service: Gynecology;  Laterality: N/A;   TRIGGER FINGER RELEASE Right 11/18/2018   Procedure: RELEASE TRIGGER FINGER/A-1 PULLEY right ring;  Surgeon: [name], MD;  Location: [facility];  Service: Orthopedics;  Laterality: Right;   TUBAL LIGATION      Family History  Problem Relation Age of Onset   Breast cancer Mother    Cancer Mother 63       breast CA at 39 and uterine CA at 84   Hypertension Mother     Kidney disease Mother        kidney transplant   Diabetes Father    Liver disease Father    Cancer Sister 57       uterine   Cancer Sister 41       uterine   Breast cancer Maternal Aunt    Cancer Maternal Aunt        breast    Social History   Socioeconomic History   Marital status: Divorced    Spouse name: Not on file   Number of children: 3   Years of education: Not on file   Highest education level: 11th grade  Occupational History   Not on file  Tobacco Use   Smoking status: Every Day    Packs/day: 0.00    Years: 7.00    Pack years: 0.00    Types: Cigars, Cigarettes    Last attempt to quit: 11/05/2017    Years since quitting: 3.0   Smokeless tobacco: Never   Tobacco comments:    1 cigar per day  Vaping Use   Vaping Use: Never used  Substance and Sexual Activity   Alcohol use: Not Currently    Comment: last used 10 days ago   Drug use: Not Currently    Types: Marijuana   Sexual activity: Yes    Birth control/protection: Surgical    Comment: supracervical hyst  Other Topics Concern   Not on file  Social History Narrative   Not on file   Social Determinants of Health   Financial Resource Strain: Not on file  Food Insecurity: Not on file  Transportation Needs: Not on file  Physical Activity: Not on file  Stress: Not on file  Social Connections: Not on file  Intimate Partner Violence: Not on file    Outpatient Medications Prior to Visit  Medication Sig Dispense Refill   amLODipine (NORVASC) 5 MG tablet Take 1 tablet (5 mg total) by mouth daily. 90 tablet 3   aspirin-acetaminophen-caffeine (EXCEDRIN MIGRAINE) 250-250-65 MG tablet Take 2 tablets by mouth every 6 (six) hours as needed for headache or migraine.      busPIRone (BUSPAR) 15 MG tablet TAKE 1 TABLET (15 MG TOTAL) BY MOUTH 2 (TWO) TIMES DAILY. 180 tablet 0   cyclobenzaprine (FLEXERIL) 10 MG tablet Take 1 tablet (10 mg total) by mouth 3 (three) times daily as needed for muscle spasms. 30 tablet 1    estradiol (ESTRACE) 2 MG tablet Take 1 tablet (2 mg total) by mouth at bedtime. 90 tablet 4   fluticasone (FLONASE) 50 MCG/ACT nasal spray Place 1 spray into both nostrils 2 (two) times daily as needed for allergies or rhinitis. 16 g 6   lisinopril (ZESTRIL) 5 MG tablet Take 1 tablet (5 mg total) by mouth daily. 90 tablet 3   naproxen sodium (ALEVE) 220 MG tablet Take 2 tablets (440 mg total) by mouth 2 (two) times daily as needed. 60 tablet 1   ciprofloxacin (CIPRO) 500 MG tablet Take 1 tablet (500 mg total) by mouth 2 (two) times daily. 14 tablet 0   norethindrone (ORTHO MICRONOR) 0.35 MG tablet Take 1 tablet (0.35 mg total) by mouth daily. 28 tablet 11   No facility-administered medications prior to visit.    Allergies  Allergen Reactions   Imitrex [Sumatriptan] Anaphylaxis and Other (See Comments)    Could not swallow after taking med   Latuda [Lurasidone Hcl] Other (See Comments)    Suicidal thoughts    Wellbutrin [Bupropion] Other (See Comments)    Seizures / lowered seizure threshold   Zoloft [Sertraline Hcl] Hives   Haloperidol And Related Other (See Comments)    Leg cramps   Nicoderm [Nicotine] Dermatitis    Review of Systems As per HPI.     Objective:    Physical Exam Vitals and nursing note reviewed.  Constitutional:      General: She is not in acute distress.    Appearance: She is not ill-appearing, toxic-appearing or diaphoretic.  Pulmonary:     Effort: Pulmonary effort is normal. No respiratory distress.  Musculoskeletal:     Right knee: No swelling, deformity, effusion or erythema. Tenderness present over the lateral joint line. No medial joint line, MCL, LCL, ACL, PCL or patellar tendon tenderness. Normal patellar mobility.     Instability Tests: Medial McMurray test negative and lateral McMurray test negative.     Right lower leg: No edema.     Left lower leg: No edema.  Skin:    General: Skin is warm and dry.  Neurological:     Mental Status: She is  alert and oriented to person, place, and time.  Psychiatric:        Mood and Affect: Mood normal.        Behavior: Behavior normal.    BP 118/70   Pulse 75   Temp (!) 97.5 F (36.4 C) (Temporal)  Ht 5\' 3"  (1.6 m)   Wt 173 lb 4 oz (78.6 kg)   LMP 08/10/2016 Comment: [place]  BMI 30.69 kg/m  Wt Readings from Last 3 Encounters:  12/07/20 173 lb 4 oz (78.6 kg)  11/25/20 176 lb (79.8 kg)  11/18/20 171 lb (77.6 kg)    Health Maintenance Due  Topic Date Due   COLONOSCOPY (Pts 45-14yrs Insurance coverage will need to be confirmed)  Never done    There are no preventive care reminders to display for this patient.   Lab Results  Component Value Date   TSH 0.664 10/21/2019   Lab Results  Component Value Date   WBC 6.9 10/21/2019   HGB 13.4 10/21/2019   HCT 38.4 10/21/2019   MCV 96 10/21/2019   PLT 212 10/21/2019   Lab Results  Component Value Date   NA 140 10/21/2019   K 4.2 10/21/2019   CO2 23 10/21/2019   GLUCOSE 90 10/21/2019   BUN 9 10/21/2019   CREATININE 0.73 10/21/2019   BILITOT 0.3 10/21/2019   ALKPHOS 84 10/21/2019   AST 15 10/21/2019   ALT 12 10/21/2019   PROT 6.5 10/21/2019   ALBUMIN 4.2 10/21/2019   CALCIUM 9.0 10/21/2019   ANIONGAP 9 11/14/2018   Lab Results  Component Value Date   CHOL 199 10/21/2019   Lab Results  Component Value Date   HDL 69 10/21/2019   Lab Results  Component Value Date   LDLCALC 113 (H) 10/21/2019   Lab Results  Component Value Date   TRIG 96 10/21/2019   Lab Results  Component Value Date   CHOLHDL 2.9 10/21/2019   Lab Results  Component Value Date   HGBA1C 6.0 04/23/2016       Assessment & Plan:   Afsana was seen today for knee pain.  Diagnoses and all orders for this visit:  Acute pain of right knee Xray today in office, radiology report pending. Try Voltaren as below. Discussed rest, ice, heat.  -     diclofenac (VOLTAREN) 75 MG EC tablet; Take 1 tablet (75 mg total) by mouth 2 (two) times daily. -      DG Knee 1-2 Views Right; Future  Return to office for new or worsening symptoms, or if symptoms persist.   The patient indicates understanding of these issues and agrees with the plan.  Gwenlyn Perking, FNP

## 2020-12-12 ENCOUNTER — Telehealth: Payer: Self-pay | Admitting: *Deleted

## 2020-12-12 DIAGNOSIS — M25561 Pain in right knee: Secondary | ICD-10-CM

## 2020-12-12 MED ORDER — MELOXICAM 7.5 MG PO TABS
7.5000 mg | ORAL_TABLET | Freq: Every day | ORAL | 0 refills | Status: DC
Start: 2020-12-12 — End: 2021-03-07

## 2020-12-12 NOTE — Telephone Encounter (Signed)
Patient must try and fail 2 of the preferred formulary. Pateint has failed naproxen. She must fail one more-celebrex, indomethacin, ketorolac, meloxicam, or sulindac

## 2020-12-12 NOTE — Telephone Encounter (Signed)
Meloxicam ordered

## 2020-12-13 NOTE — Telephone Encounter (Signed)
Patient aware and verbalizes understanding. 

## 2020-12-15 ENCOUNTER — Encounter: Payer: Self-pay | Admitting: Internal Medicine

## 2021-01-13 ENCOUNTER — Other Ambulatory Visit: Payer: Self-pay

## 2021-01-13 ENCOUNTER — Ambulatory Visit: Payer: Medicaid Other | Admitting: Family Medicine

## 2021-01-13 ENCOUNTER — Encounter: Payer: Self-pay | Admitting: Family Medicine

## 2021-01-13 VITALS — BP 133/81 | HR 75 | Temp 97.8°F | Ht 63.0 in | Wt 172.2 lb

## 2021-01-13 DIAGNOSIS — G8929 Other chronic pain: Secondary | ICD-10-CM

## 2021-01-13 DIAGNOSIS — M25561 Pain in right knee: Secondary | ICD-10-CM

## 2021-01-13 MED ORDER — METHYLPREDNISOLONE ACETATE 40 MG/ML IJ SUSP
40.0000 mg | Freq: Once | INTRAMUSCULAR | Status: AC
  Administered 2021-01-13: 40 mg via INTRAMUSCULAR

## 2021-01-13 NOTE — Patient Instructions (Addendum)
I think you have a meniscal injury.  We treated you with a steroid shot in the knee.  I've referred to physical therapy.  See Dr D in 3-4 weeks for recheck  Knee Injection A knee injection is a procedure to get medicine into your knee joint to relieve the pain, swelling, and stiffness of arthritis. Your health care provider uses a needle to inject medicine, which may also help to lubricate and cushion your knee joint. You may need more than one injection. Tell a health care provider about: Any allergies you have. All medicines you are taking, including vitamins, herbs, eye drops, creams, and over-the-counter medicines. Any problems you or family members have had with anesthetic medicines. Any blood disorders you have. Any surgeries you have had. Any medical conditions you have. Whether you are pregnant or may be pregnant. What are the risks? Generally, this is a safe procedure. However, problems may occur, including: Infection. Bleeding. Symptoms that get worse. Damage to the area around your knee. Allergic reaction to any of the medicines. Skin reactions from repeated injections. What happens before the procedure? Ask your health care provider about: Changing or stopping your regular medicines. This is especially important if you are taking diabetes medicines or blood thinners. Taking medicines such as aspirin and ibuprofen. These medicines can thin your blood. Do not take these medicines unless your health care provider tells you to take them. Taking over-the-counter medicines, vitamins, herbs, and supplements. Plan to have a responsible adult take you home from the hospital or clinic. What happens during the procedure?  You will sit or lie down in a position for your knee to be treated. The skin over your kneecap will be cleaned with a germ-killing soap. You will be given a medicine that numbs the area (local anesthetic). You may feel some stinging. The medicine will be injected  into your knee. The needle is carefully placed between your kneecap and your knee. The medicine is injected into the joint space. The needle will be removed at the end of the procedure. A bandage (dressing) may be placed over the injection site. The procedure may vary among health care providers and hospitals. What can I expect after the procedure? Your blood pressure, heart rate, breathing rate, and blood oxygen level will be monitored until you leave the hospital or clinic. You may have to move your knee through its full range of motion. This helps to get all the medicine into your joint space. You will be watched to make sure that you do not have a reaction to the injected medicine. You may feel more pain, swelling, and warmth than you did before the injection. This reaction may last about 1-2 days. Follow these instructions at home: Medicines Take over-the-counter and prescription medicines only as told by your health care provider. Ask your health care provider if the medicine prescribed to you requires you to avoid driving or using machinery. Do not take medicines such as aspirin and ibuprofen unless your health care provider tells you to take them. Injection site care Follow instructions from your health care provider about: How to take care of your puncture site. When and how you should change your dressing. When you should remove your dressing. Check your injection area every day for signs of infection. Check for: More redness, swelling, or pain after 2 days. Fluid or blood. Pus or a bad smell. Warmth. Managing pain, stiffness, and swelling  If directed, put ice on the injection area. To do this: Put  ice in a plastic bag. Place a towel between your skin and the bag. Leave the ice on for 20 minutes, 2-3 times per day. Remove the ice if your skin turns bright red. This is very important. If you cannot feel pain, heat, or cold, you have a greater risk of damage to the area. Do  not apply heat to your knee. Raise (elevate) the injection area above the level of your heart while you are sitting or lying down. General instructions If you were given a dressing, keep it dry until your health care provider says it can be removed. Ask your health care provider when you can start showering or bathing. Avoid strenuous activities for as long as directed by your health care provider. Ask your health care provider when you can return to your normal activities. Keep all follow-up visits. This is important. You may need more injections. Contact a health care provider if you have: A fever. Warmth in your injection area. Fluid, blood, or pus coming from your injection site. Symptoms at your injection site that last longer than 2 days after your procedure. Get help right away if: Your knee turns very red. Your knee becomes very swollen. Your knee is in severe pain. Summary A knee injection is a procedure to get medicine into your knee joint to relieve the pain, swelling, and stiffness of arthritis. A needle is carefully placed between your kneecap and your knee to inject medicine into the joint space. Before the procedure, ask your health care provider about changing or stopping your regular medicines, especially if you are taking diabetes medicines or blood thinners. Contact your health care provider if you have any problems or questions after your procedure. This information is not intended to replace advice given to you by your health care provider. Make sure you discuss any questions you have with your health care provider. Document Revised: 08/05/2019 Document Reviewed: 08/05/2019 Elsevier Patient Education  2022 [employer].

## 2021-01-13 NOTE — Progress Notes (Signed)
Subjective: CC: Knee pain PCP: Dettinger, Fransisca Kaufmann, MD [id number] Stacy Wolf is a 45 y.o. female presenting to clinic today for:  1.  Knee pain Patient reports right-sided knee pain that is been ongoing since a fall in March.  She has utilized oral NSAIDs, muscle relaxers, ice and perform home physical therapy exercises with no significant improvement in symptoms.  She points to the right anterior lateral aspect of the knee as the area of pain.  Sometimes it feels swollen on that side.  She reports sensation of instability but denies any popping or locking of the knee.  Only recently has she been using a knee brace.  No reports of sensory changes.  Her job unfortunately requires her to kneel quite a bit.  She denies any use of blood thinners.  No history of allergy to lidocaine or corticosteroids.  She is not a diabetic.   ROS: Per HPI  Allergies  Allergen Reactions   Imitrex [Sumatriptan] Anaphylaxis and Other (See Comments)    Could not swallow after taking med   Latuda [Lurasidone Hcl] Other (See Comments)    Suicidal thoughts    Wellbutrin [Bupropion] Other (See Comments)    Seizures / lowered seizure threshold   Zoloft [Sertraline Hcl] Hives   Haloperidol And Related Other (See Comments)    Leg cramps   Nicoderm [Nicotine] Dermatitis   Past Medical History:  Diagnosis Date   Anxiety    Bipolar 1 disorder (HCC)    Heart murmur    Migraines    Schizophrenia ([place])     Current Outpatient Medications:    amLODipine (NORVASC) 5 MG tablet, Take 1 tablet (5 mg total) by mouth daily., Disp: 90 tablet, Rfl: 3   aspirin-acetaminophen-caffeine (EXCEDRIN MIGRAINE) 250-250-65 MG tablet, Take 2 tablets by mouth every 6 (six) hours as needed for headache or migraine. , Disp: , Rfl:    busPIRone (BUSPAR) 15 MG tablet, TAKE 1 TABLET (15 MG TOTAL) BY MOUTH 2 (TWO) TIMES DAILY., Disp: 180 tablet, Rfl: 0   cyclobenzaprine (FLEXERIL) 10 MG tablet, Take 1 tablet (10 mg total) by mouth 3 (three)  times daily as needed for muscle spasms., Disp: 30 tablet, Rfl: 1   estradiol (ESTRACE) 2 MG tablet, Take 1 tablet (2 mg total) by mouth at bedtime., Disp: 90 tablet, Rfl: 4   fluticasone (FLONASE) 50 MCG/ACT nasal spray, Place 1 spray into both nostrils 2 (two) times daily as needed for allergies or rhinitis., Disp: 16 g, Rfl: 6   lisinopril (ZESTRIL) 5 MG tablet, Take 1 tablet (5 mg total) by mouth daily., Disp: 90 tablet, Rfl: 3   meloxicam (MOBIC) 7.5 MG tablet, Take 1 tablet (7.5 mg total) by mouth daily., Disp: 30 tablet, Rfl: 0   naproxen sodium (ALEVE) 220 MG tablet, Take 2 tablets (440 mg total) by mouth 2 (two) times daily as needed., Disp: 60 tablet, Rfl: 1 Social History   Socioeconomic History   Marital status: Divorced    Spouse name: Not on file   Number of children: 3   Years of education: Not on file   Highest education level: 11th grade  Occupational History   Not on file  Tobacco Use   Smoking status: Every Day    Packs/day: 0.00    Years: 7.00    Pack years: 0.00    Types: Cigars, Cigarettes    Last attempt to quit: 11/05/2017    Years since quitting: 3.1   Smokeless tobacco: Never   Tobacco comments:  1 cigar per day  Vaping Use   Vaping Use: Never used  Substance and Sexual Activity   Alcohol use: Not Currently    Comment: last used 10 days ago   Drug use: Not Currently    Types: Marijuana   Sexual activity: Yes    Birth control/protection: Surgical    Comment: supracervical hyst  Other Topics Concern   Not on file  Social History Narrative   Not on file   Social Determinants of Health   Financial Resource Strain: Not on file  Food Insecurity: Not on file  Transportation Needs: Not on file  Physical Activity: Not on file  Stress: Not on file  Social Connections: Not on file  Intimate Partner Violence: Not on file   Family History  Problem Relation Age of Onset   Breast cancer Mother    Cancer Mother 41       breast CA at 96 and uterine CA  at 80   Hypertension Mother    Kidney disease Mother        kidney transplant   Diabetes Father    Liver disease Father    Cancer Sister 66       uterine   Cancer Sister 3       uterine   Breast cancer Maternal Aunt    Cancer Maternal Aunt        breast    Objective: Office vital signs reviewed. BP 133/81   Pulse 75   Temp 97.8 F (36.6 C)   Ht 5\' 3"  (1.6 m)   Wt 172 lb 3.2 oz (78.1 kg)   LMP 08/10/2016 Comment: [place]  SpO2 97%   BMI 30.50 kg/m   Physical Examination:  General: Awake, alert, well nourished, No acute distress MSK:   Right knee: Has full active range of motion.  She does have tenderness palpation along the right lateral aspect of the knee.  No gross joint effusion or soft tissue swelling.  No ligamentous laxity.  No tenderness palpation or palpable masses in the posterior popliteal fossa.  She has pain with Thessaly maneuver  JOINT INJECTION:  Patient denies allergy to antiseptics (including iodine) and anesthetics.  Patient denies h/o diabetes, frequent steroid use, use of blood thinners/ antiplatelets.  Patient was given informed consent and a signed copy has been placed in the chart. Appropriate time out was taken. Area prepped and draped in usual sterile fashion. Anatomic landmarks were identified and injection site was marked.  Ethyl chloride spray was used to numb the area and 1 cc of methylprednisolone 40 mg/ml plus  3 cc of 1% lidocaine without epinephrine was injected into the right using a(n) anteriomedial approach. The patient tolerated the procedure well and there were no immediate complications. Estimated blood loss is less than 1cc.  Post procedure instructions were reviewed and handout outlining these instructions were provided to patient.   Assessment/ Plan: 45 y.o. female   Chronic pain of right knee - Plan: Ambulatory referral to Physical Therapy  Treated today with corticosteroid injection.  Referral to physical therapy placed as well.  We  discussed alternative options including referral to orthopedics versus MRI of knee.  Would like her to come back and see her PCP in the next 4 weeks or so for reevaluation.  Can determine at that time if referral to orthopedics is warranted.  We discussed red flag signs and symptoms following corticosteroid injection and reasons for emergent evaluation emergency department.  She was good understanding and will  follow up as needed  No orders of the defined types were placed in this encounter.  No orders of the defined types were placed in this encounter.    Janora Norlander, DO [place] [phone]

## 2021-01-24 ENCOUNTER — Other Ambulatory Visit: Payer: Self-pay

## 2021-01-24 ENCOUNTER — Ambulatory Visit: Payer: Medicaid Other | Attending: Family Medicine | Admitting: Physical Therapy

## 2021-01-24 ENCOUNTER — Encounter: Payer: Self-pay | Admitting: Physical Therapy

## 2021-01-24 DIAGNOSIS — M25661 Stiffness of right knee, not elsewhere classified: Secondary | ICD-10-CM

## 2021-01-24 DIAGNOSIS — M25561 Pain in right knee: Secondary | ICD-10-CM | POA: Diagnosis present

## 2021-01-24 DIAGNOSIS — G8929 Other chronic pain: Secondary | ICD-10-CM | POA: Diagnosis present

## 2021-01-24 NOTE — Therapy (Signed)
[facility] [place], [place], [address] Phone: [phone]   Fax:  [phone]  Physical Therapy Evaluation  Patient Details  Name: MIMI DEBELLIS MRN: [id number] Date of Birth: Feb 22, 1976 Referring Provider (PT): Ronnie Doss DO   Encounter Date: 01/24/2021   PT End of Session - 01/24/21 1115     Visit Number 1    Number of Visits 12    Date for PT Re-Evaluation 03/14/21    PT Start Time 0954    PT Stop Time 1020    PT Time Calculation (min) 26 min    Activity Tolerance Patient tolerated treatment well    Behavior During Therapy [facility] for tasks assessed/performed             Past Medical History:  Diagnosis Date   Anxiety    Bipolar 1 disorder ([place])    Heart murmur    Migraines    Schizophrenia ([place])     Past Surgical History:  Procedure Laterality Date   arm surgery Right    tendonitis-wrist   BILATERAL SALPINGECTOMY Bilateral 08/29/2016   Procedure: BILATERAL SALPINGECTOMY;  Surgeon: Florian Buff, MD;  Location: [facility];  Service: Gynecology;  Laterality: Bilateral;   CARPAL TUNNEL RELEASE Left 04/11/2016   Procedure: CARPAL TUNNEL RELEASE;  Surgeon: [name], MD;  Location: [facility];  Service: Orthopedics;  Laterality: Left;   CARPAL TUNNEL RELEASE Right 03/13/2018   Procedure: RIGHT CARPAL TUNNEL RELEASE;  Surgeon: [name], MD;  Location: [facility];  Service: Orthopedics;  Laterality: Right;   SUPRACERVICAL ABDOMINAL HYSTERECTOMY N/A 08/29/2016   Procedure: HYSTERECTOMY SUPRACERVICAL ABDOMINAL;  Surgeon: Florian Buff, MD;  Location: [facility];  Service: Gynecology;  Laterality: N/A;   TRIGGER FINGER RELEASE Right 11/18/2018   Procedure: RELEASE TRIGGER FINGER/A-1 PULLEY right ring;  Surgeon: [name], MD;  Location: [facility];  Service: Orthopedics;  Laterality: Right;   TUBAL LIGATION      There were no vitals filed for this visit.    Subjective Assessment - 01/24/21 1032      Subjective COVID-19 screen performed prior to patient entering clinic. The patient presents to the clinic today with c/o right knee pain that she reports was the result of a fall after a 12 hour workshift in March of this year.  She states she was going into her home and fell on her right knee.  Her pain is tated at a 8/10 today.  Prolonged walking increases her pain and resting wihther right LE elevated decreases her pain.    Pertinent History Left CTR, Bipolar 1.    How long can you walk comfortably? Can walk over the length of her workshift but her knee hurts a lot and swells.    Patient Stated Goals Get out of pain.    Currently in Pain? Yes    Pain Score 8     Pain Orientation Right    Pain Descriptors / Indicators Throbbing    Pain Type Chronic pain    Pain Onset More than a month ago    Pain Frequency Constant    Aggravating Factors  See above.    Pain Relieving Factors See above.                [facility] PT Assessment - 01/24/21 0001       Assessment   Medical Diagnosis Chronic right knee pain.    Referring Provider (PT) Ronnie Doss DO    Onset Date/Surgical  Date --   March/2022.     Precautions   Precaution Comments Pain-free right LE ther ex.      Restrictions   Weight Bearing Restrictions No      Balance Screen   Has the patient fallen in the past 6 months Yes    How many times? 2.    Has the patient had a decrease in activity level because of a fear of falling?  Yes    Is the patient reluctant to leave their home because of a fear of falling?  No      Home Environment   Living Environment Private residence      Prior Function   Level of Independence Independent      Observation/Other Assessments-Edema    Edema Circumferential      Circumferential Edema   Circumferential - Right RT is .5 cm > left.  Patient reports her right knee swells more the longer she is up walking.      ROM / Strength   AROM / PROM / Strength AROM;Strength      AROM    Overall AROM Comments Right knee active extension is full and flexion is 105 degrees compared to left which was 117 degrees.      Strength   Overall Strength Comments Right knee extension graded at 4+/5 with a "shaky" muscular response.      Palpation   Palpation comment Tender to palpation over patient's right lateral joint line and especially at the anterior/lateral tibial plateau.      Special Tests   Other special tests (-) right knee stability testing.      Ambulation/Gait   Gait Comments WNL this morning.                        Objective measurements completed on examination: See above findings.                     PT Long Term Goals - 01/24/21 1119       PT LONG TERM GOAL #1   Title Independent with a HEP.    Time 6    Period Weeks    Status New      PT LONG TERM GOAL #2   Title Right knee active flexion to 115 degrees.    Time 6    Period Weeks    Status New      PT LONG TERM GOAL #3   Title Perform ADL's with right knee pain not > 2-3/10.    Time 6    Period Weeks    Status New      PT LONG TERM GOAL #4   Title Walk a community distance with right knee pain not > 2-3/10.    Time 6    Period Weeks    Status New                    Plan - 01/24/21 1115     Clinical Impression Statement The patient presents to [facility]PP[facility] with c/o right knee pain that occurred in March of this year.  She was found to lack some flexion and quadriceps strength.  She had minimal swelling this morning but states her knee swells a lot more the longer she is up and walking.  Stability testing is normal.  She is tender to palpation over her right lateral joint line and especially over her right anterior/lateral tibial plateau.  Patient will  benefit from skilled physical therapy intervention to address pain and deficits.    Personal Factors and Comorbidities Comorbidity 1;Other    Examination-Activity Limitations Locomotion Level;Stand;Other     Examination-Participation Restrictions Other    Stability/Clinical Decision Making Stable/Uncomplicated    Clinical Decision Making Low    Rehab Potential Excellent    PT Frequency 2x / week    PT Duration 6 weeks    PT Treatment/Interventions ADLs/Self Care Home Management;Cryotherapy;Electrical Stimulation;Ultrasound;Moist Heat;Iontophoresis 4mg /ml Dexamethasone;Functional mobility training;Therapeutic activities;Therapeutic exercise;Neuromuscular re-education;Manual techniques;Patient/family education;Passive range of motion;Dry needling;Vasopneumatic Device    PT Next Visit Plan Recumbent bike, pain-free right LE ther ex (O and CKC), modalities as needed.    Consulted and Agree with Plan of Care Patient             Patient will benefit from skilled therapeutic intervention in order to improve the following deficits and impairments:  Pain, Decreased activity tolerance, Decreased strength, Decreased range of motion  Visit Diagnosis: Chronic pain of right knee - Plan: PT plan of care cert/re-cert  Stiffness of right knee, not elsewhere classified - Plan: PT plan of care cert/re-cert     Problem List Patient Active Problem List   Diagnosis Date Noted   Status post partial hysterectomy 08/19/2020   Current use of estrogen therapy 01/20/2019   Posttraumatic stress disorder 10/17/2016   Personality disorder ([place]) 10/17/2016   Menorrhagia with irregular cycle 08/13/2016   Obesity (BMI 30.0-34.9) 04/23/2016   Essential hypertension, benign 01/16/2016   Migraine headache 04/18/2015   Tobacco abuse 02/02/2015   Constipation 02/02/2015   Gastroesophageal reflux disease without esophagitis 08/24/2014   Hyperlipidemia LDL goal <100 09/14/2013   Bipolar disorder ([place]) 08/25/2013    Raylea Adcox, [place], PT 01/24/2021, 11:23 AM  [facility] [address] [place], [place], [address] Phone: [phone]   Fax:  [phone]  Name: YERALDY SPIKE MRN: [id number] Date of Birth: 07-08-1975

## 2021-02-06 ENCOUNTER — Ambulatory Visit (AMBULATORY_SURGERY_CENTER): Payer: Medicaid Other

## 2021-02-06 ENCOUNTER — Other Ambulatory Visit: Payer: Self-pay

## 2021-02-06 VITALS — Ht 63.0 in | Wt 172.0 lb

## 2021-02-06 DIAGNOSIS — Z1211 Encounter for screening for malignant neoplasm of colon: Secondary | ICD-10-CM

## 2021-02-06 MED ORDER — NA SULFATE-K SULFATE-MG SULF 17.5-3.13-1.6 GM/177ML PO SOLN: 1.0000 | Freq: Once | ORAL | 0 refills | Status: AC

## 2021-02-06 NOTE — Progress Notes (Signed)
Pre visit completed via phone call; Patient verified name, DOB, and address; No egg or soy allergy known to patient  No issues known to pt with past sedation with any surgeries or procedures Patient denies ever being told they had issues or difficulty with intubation  No FH of Malignant Hyperthermia Pt is not on diet pills Pt is not on home 02  Pt is not on blood thinners  Pt reports issues with constipation -patient reports she "drinks milk or anything with milk products and that makes me go"---patient advised to increase po fluids, activity, fresh fruits/veggies that are not contraindicated- No A fib or A flutter Pt is fully vaccinated for Covid x 2; NO PA's for preps discussed with pt in PV today  Discussed with pt there will be an out-of-pocket cost for prep and that varies from $0 to 70 +  dollars - pt verbalized understanding  Due to the COVID-19 pandemic we are asking patients to follow certain guidelines in PV and the [place]   Pt aware of COVID protocols and [facility] guidelines

## 2021-02-07 ENCOUNTER — Ambulatory Visit: Payer: Medicaid Other | Attending: Family Medicine

## 2021-02-07 ENCOUNTER — Other Ambulatory Visit: Payer: Self-pay

## 2021-02-07 DIAGNOSIS — M25561 Pain in right knee: Secondary | ICD-10-CM | POA: Diagnosis present

## 2021-02-07 DIAGNOSIS — G8929 Other chronic pain: Secondary | ICD-10-CM | POA: Insufficient documentation

## 2021-02-07 DIAGNOSIS — M25661 Stiffness of right knee, not elsewhere classified: Secondary | ICD-10-CM | POA: Diagnosis present

## 2021-02-07 NOTE — Therapy (Signed)
[facility] [place], [place], [address] Phone: [phone]   Fax:  [phone]  Physical Therapy Treatment  Patient Details  Name: Stacy Wolf MRN: [id number] Date of Birth: 22-Feb-1976 Referring Provider (PT): Ronnie Doss DO   Encounter Date: 02/07/2021   PT End of Session - 02/07/21 8101     Visit Number 2    Number of Visits 12    Date for PT Re-Evaluation 03/14/21    PT Start Time 0945    PT Stop Time 1048    PT Time Calculation (min) 63 min    Activity Tolerance Patient tolerated treatment well    Behavior During Therapy [facility] for tasks assessed/performed             Past Medical History:  Diagnosis Date   Anxiety    on meds   Bipolar 1 disorder ([place])    Blood transfusion without reported diagnosis 2000   Depression    on meds   GERD (gastroesophageal reflux disease)    on meds   Glaucoma    LEFT eye- not a surgical candidate at this time (02/06/2021)   Heart murmur    Hypertension    on meds   Migraines    Schizophrenia ([place])    Seasonal allergies     Past Surgical History:  Procedure Laterality Date   BILATERAL SALPINGECTOMY Bilateral 08/29/2016   Procedure: BILATERAL SALPINGECTOMY;  Surgeon: Florian Buff, MD;  Location: [facility];  Service: Gynecology;  Laterality: Bilateral;   CARPAL TUNNEL RELEASE Left 04/11/2016   Procedure: CARPAL TUNNEL RELEASE;  Surgeon: [name], MD;  Location: [facility];  Service: Orthopedics;  Laterality: Left;   CARPAL TUNNEL RELEASE Right 03/13/2018   Procedure: RIGHT CARPAL TUNNEL RELEASE;  Surgeon: [name], MD;  Location: [facility];  Service: Orthopedics;  Laterality: Right;   SUPRACERVICAL ABDOMINAL HYSTERECTOMY N/A 08/29/2016   Procedure: HYSTERECTOMY SUPRACERVICAL ABDOMINAL;  Surgeon: Florian Buff, MD;  Location: [facility];  Service: Gynecology;  Laterality: N/A;   TRIGGER FINGER RELEASE Right 11/18/2018   Procedure: RELEASE TRIGGER  FINGER/A-1 PULLEY right ring;  Surgeon: [name], MD;  Location: [facility];  Service: Orthopedics;  Laterality: Right;   TUBAL LIGATION     WISDOM TOOTH EXTRACTION     WRIST SURGERY Right 2006   tendonitis    There were no vitals filed for this visit.   Subjective Assessment - 02/07/21 0947     Subjective COVID-19 screen performed prior to patient entering clinic. Patient reports that her knee is "hanging in there today."    Pertinent History Left CTR, Bipolar 1.    How long can you walk comfortably? Can walk over the length of her workshift but her knee hurts a lot and swells.    Patient Stated Goals Get out of pain.    Currently in Pain? Yes    Pain Score 7     Pain Location Knee    Pain Orientation Right    Pain Type Chronic pain    Pain Onset More than a month ago                               [facility] Adult PT Treatment/Exercise - 02/07/21 0001       Exercises   Exercises Knee/Hip      Knee/Hip Exercises: Stretches   Gastroc Stretch Right;4 reps;30 seconds  Knee/Hip Exercises: Aerobic   Nustep L3 x 13 minutes      Knee/Hip Exercises: Standing   Rocker Board 3 minutes      Knee/Hip Exercises: Seated   Long Arc Quad Strengthening;Both;20 reps;Weights;2 sets    [employer] Weight 5 lbs.      Modalities   Modalities Vasopneumatic      Vasopneumatic   Number Minutes Vasopneumatic  15 minutes    Vasopnuematic Location  Knee    Vasopneumatic Pressure Low    Vasopneumatic Temperature  34                 Balance Exercises - 02/07/21 0001       Balance Exercises: Standing   Tandem Stance Eyes open;Intermittent upper extremity support;4 reps;30 secs    Marching Foam/compliant surface;Upper extremity assist 2;Static   3 minutes                 PT Short Term Goals - 02/07/21 1145       PT SHORT TERM GOAL #1   Title Patient will be independent with her initial HEP.    Baseline gastroc stretch and long arc quad  (provided on 02/07/21)    Time 1    Period Weeks    Status New    Target Date 02/10/21               PT Long Term Goals - 01/24/21 1119       PT LONG TERM GOAL #1   Title Independent with a HEP.    Baseline No knowledge of appropriate ther ex.    Time 6    Period Weeks    Status New      PT LONG TERM GOAL #2   Title Right knee active flexion to 115 degrees.    Baseline 105 degrees.    Time 6    Period Weeks    Status New      PT LONG TERM GOAL #3   Title Perform ADL's with right knee pain not > 2-3/10.    Baseline Pain increases to high levels whne performing ADL's.    Time 6    Period Weeks    Status New      PT LONG TERM GOAL #4   Title Walk a community distance with right knee pain not > 2-3/10.    Baseline Pain increases to high levels with increased walking.    Time 6    Period Weeks    Status New                   Plan - 02/07/21 3662     Clinical Impression Statement Patient was progressed with multiple new interventions for improved knee mobility and stability with moderate difficulty. She experienced a moderate "burn" in the right knee and lower extremity with the nustep and the rocker board today, but this was able to be easily reduced with brief rest breaks between interventions. She required minimal cuing with long arc quads to facilitate full knee extension to increase demand on the quadriceps. She reported no other significant increases in her familiar symptoms with any of today's interventions. She reported that her knee felt good upon the conclusion of treatment. She continues to require skilled physical therapy to address her remaining impairments to return to her prior level of function.    Personal Factors and Comorbidities Comorbidity 1;Other    Examination-Activity Limitations Locomotion Level;Stand;Other    Examination-Participation Restrictions Other  Stability/Clinical Decision Making Stable/Uncomplicated    Rehab Potential  Excellent    PT Frequency 2x / week    PT Duration 6 weeks    PT Treatment/Interventions ADLs/Self Care Home Management;Cryotherapy;Electrical Stimulation;Ultrasound;Moist Heat;Iontophoresis 4mg /ml Dexamethasone;Functional mobility training;Therapeutic activities;Therapeutic exercise;Neuromuscular re-education;Manual techniques;Patient/family education;Passive range of motion;Dry needling;Vasopneumatic Device    PT Next Visit Plan Recumbent bike, pain-free right LE ther ex (O and CKC), modalities as needed.    Consulted and Agree with Plan of Care Patient             Patient will benefit from skilled therapeutic intervention in order to improve the following deficits and impairments:  Pain, Decreased activity tolerance, Decreased strength, Decreased range of motion  Visit Diagnosis: Chronic pain of right knee  Stiffness of right knee, not elsewhere classified     Problem List Patient Active Problem List   Diagnosis Date Noted   Status post partial hysterectomy 08/19/2020   Current use of estrogen therapy 01/20/2019   Posttraumatic stress disorder 10/17/2016   Personality disorder ([place]) 10/17/2016   Menorrhagia with irregular cycle 08/13/2016   Obesity (BMI 30.0-34.9) 04/23/2016   Essential hypertension, benign 01/16/2016   Migraine headache 04/18/2015   Tobacco abuse 02/02/2015   Constipation 02/02/2015   Gastroesophageal reflux disease without esophagitis 08/24/2014   Hyperlipidemia LDL goal <100 09/14/2013   Bipolar disorder ([place]) 08/25/2013    Darlin Coco, PT 02/07/2021, 11:46 AM  [facility] [place], [place], [address] Phone: [phone]   Fax:  [phone]  Name: Stacy Wolf MRN: [id number] Date of Birth: 1976/01/06

## 2021-02-09 ENCOUNTER — Other Ambulatory Visit: Payer: Self-pay

## 2021-02-09 ENCOUNTER — Ambulatory Visit: Payer: Medicaid Other

## 2021-02-09 DIAGNOSIS — M25561 Pain in right knee: Secondary | ICD-10-CM | POA: Diagnosis not present

## 2021-02-09 DIAGNOSIS — G8929 Other chronic pain: Secondary | ICD-10-CM

## 2021-02-09 DIAGNOSIS — M25661 Stiffness of right knee, not elsewhere classified: Secondary | ICD-10-CM

## 2021-02-09 NOTE — Therapy (Signed)
[facility] [place], [place], [address] Phone: [phone]   Fax:  [phone]  Physical Therapy Treatment  Patient Details  Name: Stacy Wolf MRN: [id number] Date of Birth: 1975-12-06 Referring Provider (PT): Ronnie Doss DO   Encounter Date: 02/09/2021   PT End of Session - 02/09/21 0728     Visit Number 3    Number of Visits 12    Date for PT Re-Evaluation 03/14/21    PT Start Time 0730    PT Stop Time 0817    PT Time Calculation (min) 47 min    Activity Tolerance Patient tolerated treatment well    Behavior During Therapy [facility] for tasks assessed/performed             Past Medical History:  Diagnosis Date   Anxiety    on meds   Bipolar 1 disorder ([place])    Blood transfusion without reported diagnosis 2000   Depression    on meds   GERD (gastroesophageal reflux disease)    on meds   Glaucoma    LEFT eye- not a surgical candidate at this time (02/06/2021)   Heart murmur    Hypertension    on meds   Migraines    Schizophrenia ([place])    Seasonal allergies     Past Surgical History:  Procedure Laterality Date   BILATERAL SALPINGECTOMY Bilateral 08/29/2016   Procedure: BILATERAL SALPINGECTOMY;  Surgeon: Florian Buff, MD;  Location: [facility];  Service: Gynecology;  Laterality: Bilateral;   CARPAL TUNNEL RELEASE Left 04/11/2016   Procedure: CARPAL TUNNEL RELEASE;  Surgeon: [name], MD;  Location: [facility];  Service: Orthopedics;  Laterality: Left;   CARPAL TUNNEL RELEASE Right 03/13/2018   Procedure: RIGHT CARPAL TUNNEL RELEASE;  Surgeon: [name], MD;  Location: [facility];  Service: Orthopedics;  Laterality: Right;   SUPRACERVICAL ABDOMINAL HYSTERECTOMY N/A 08/29/2016   Procedure: HYSTERECTOMY SUPRACERVICAL ABDOMINAL;  Surgeon: Florian Buff, MD;  Location: [facility];  Service: Gynecology;  Laterality: N/A;   TRIGGER FINGER RELEASE Right 11/18/2018   Procedure: RELEASE TRIGGER  FINGER/A-1 PULLEY right ring;  Surgeon: [name], MD;  Location: [facility];  Service: Orthopedics;  Laterality: Right;   TUBAL LIGATION     WISDOM TOOTH EXTRACTION     WRIST SURGERY Right 2006   tendonitis    There were no vitals filed for this visit.   Subjective Assessment - 02/09/21 0728     Subjective COVID-19 screen performed prior to patient entering clinic.  Pt arrives for today's treatment session reporting 9/10 right knee pain.  Pt reports that she just got off work and she was on her feet a lot this shift.    Pertinent History Left CTR, Bipolar 1.    How long can you walk comfortably? Can walk over the length of her workshift but her knee hurts a lot and swells.    Patient Stated Goals Get out of pain.    Currently in Pain? Yes    Pain Score 9     Pain Location Knee    Pain Orientation Right    Pain Onset More than a month ago                               [facility] Adult PT Treatment/Exercise - 02/09/21 0001       Knee/Hip Exercises: Stretches   Gastroc Stretch Both;4 reps;30 seconds  on Rockerboard     Knee/Hip Exercises: Aerobic   Nustep Lvl 3 x 15 mins      Knee/Hip Exercises: Standing   Hip Flexion Stengthening;Both;15 reps    Hip ADduction Strengthening;Both;15 reps   cues to avoid lean   Rocker Board 3 minutes      Knee/Hip Exercises: Seated   Long Arc Quad Strengthening;Both;20 reps;Weights;2 sets    Long Arc Quad Weight --   5     Modalities   Modalities Vasopneumatic      Vasopneumatic   Number Minutes Vasopneumatic  15 minutes    Vasopnuematic Location  Knee    Vasopneumatic Pressure Low    Vasopneumatic Temperature  34                       PT Short Term Goals - 02/09/21 6283       PT SHORT TERM GOAL #1   Title Patient will be independent with her initial HEP.    Baseline gastroc stretch and long arc quad (provided on 02/07/21)    Time 1    Period Weeks    Status Achieved    Target Date 02/10/21                PT Long Term Goals - 02/09/21 0733       PT LONG TERM GOAL #1   Title Independent with a HEP.    Baseline No knowledge of appropriate ther ex.    Time 6    Period Weeks    Status On-going      PT LONG TERM GOAL #2   Title Right knee active flexion to 115 degrees.    Baseline 105 degrees.    Time 6    Period Weeks    Status On-going      PT LONG TERM GOAL #3   Title Perform ADL's with right knee pain not > 2-3/10.    Baseline Pain increases to high levels whne performing ADL's.    Time 6    Period Weeks    Status On-going      PT LONG TERM GOAL #4   Title Walk a community distance with right knee pain not > 2-3/10.    Baseline Pain increases to high levels with increased walking.    Time 6    Period Weeks    Status On-going                   Plan - 02/09/21 0740     Clinical Impression Statement Pt arrives for today's treatment session reporting 9/10 right knee pain.  Pt states that she just got off work and was on her feet most of the shift.  Pt able to tolerate increased time on Nustep for warm up without increase in pain.  Pt given rest breaks as needed for fatigue.  Pt requiring min cues for proper technique with gastroc stretch and LAQ.  Pt instructed to hold for 3 secs with LAQ due to tendency to rush thorugh reps.  Normal responses to vaso noted.  Pt reported 2/10 right knee pain at completion of today's treatment session.             Patient will benefit from skilled therapeutic intervention in order to improve the following deficits and impairments:  Pain, Decreased activity tolerance, Decreased strength, Decreased range of motion  Visit Diagnosis: Chronic pain of right knee  Stiffness of right knee, not elsewhere classified  Problem List Patient Active Problem List   Diagnosis Date Noted   Status post partial hysterectomy 08/19/2020   Current use of estrogen therapy 01/20/2019   Posttraumatic stress disorder  10/17/2016   Personality disorder ([place]) 10/17/2016   Menorrhagia with irregular cycle 08/13/2016   Obesity (BMI 30.0-34.9) 04/23/2016   Essential hypertension, benign 01/16/2016   Migraine headache 04/18/2015   Tobacco abuse 02/02/2015   Constipation 02/02/2015   Gastroesophageal reflux disease without esophagitis 08/24/2014   Hyperlipidemia LDL goal <100 09/14/2013   Bipolar disorder ([place]) 08/25/2013    Kathrynn Ducking, PTA 02/09/2021, 8:18 AM  [facility] [address] [place], [place], [address] Phone: [phone]   Fax:  [phone]  Name: Stacy Wolf MRN: [id number] Date of Birth: 08-22-1975

## 2021-02-20 ENCOUNTER — Encounter: Payer: Self-pay | Admitting: Internal Medicine

## 2021-02-20 ENCOUNTER — Other Ambulatory Visit: Payer: Self-pay

## 2021-02-20 ENCOUNTER — Ambulatory Visit (AMBULATORY_SURGERY_CENTER): Payer: Medicaid Other | Admitting: Internal Medicine

## 2021-02-20 VITALS — BP 91/70 | HR 60 | Temp 96.6°F | Resp 16 | Ht 63.0 in | Wt 172.0 lb

## 2021-02-20 DIAGNOSIS — D123 Benign neoplasm of transverse colon: Secondary | ICD-10-CM

## 2021-02-20 DIAGNOSIS — Z1211 Encounter for screening for malignant neoplasm of colon: Secondary | ICD-10-CM

## 2021-02-20 DIAGNOSIS — K635 Polyp of colon: Secondary | ICD-10-CM

## 2021-02-20 DIAGNOSIS — D125 Benign neoplasm of sigmoid colon: Secondary | ICD-10-CM

## 2021-02-20 MED ORDER — SODIUM CHLORIDE 0.9 % IV SOLN: 500.0000 mL | Freq: Once | INTRAVENOUS | Status: DC

## 2021-02-20 NOTE — Patient Instructions (Signed)
Please read handouts provided. Continue present medications. Await pathology results.   YOU HAD AN ENDOSCOPIC PROCEDURE TODAY AT THE [facility]:   Refer to the procedure report that was given to you for any specific questions about what was found during the examination.  If the procedure report does not answer your questions, please call your gastroenterologist to clarify.  If you requested that your care partner not be given the details of your procedure findings, then the procedure report has been included in a sealed envelope for you to review at your convenience later.  YOU SHOULD EXPECT: Some feelings of bloating in the abdomen. Passage of more gas than usual.  Walking can help get rid of the air that was put into your GI tract during the procedure and reduce the bloating. If you had a lower endoscopy (such as a colonoscopy or flexible sigmoidoscopy) you may notice spotting of blood in your stool or on the toilet paper. If you underwent a bowel prep for your procedure, you may not have a normal bowel movement for a few days.  Please Note:  You might notice some irritation and congestion in your nose or some drainage.  This is from the oxygen used during your procedure.  There is no need for concern and it should clear up in a day or so.  SYMPTOMS TO REPORT IMMEDIATELY:  Following lower endoscopy (colonoscopy or flexible sigmoidoscopy):  Excessive amounts of blood in the stool  Significant tenderness or worsening of abdominal pains  Swelling of the abdomen that is new, acute  Fever of 100F or higher   For urgent or emergent issues, a gastroenterologist can be reached at any hour by calling [phone]. Do not use MyChart messaging for urgent concerns.    DIET:  We do recommend a small meal at first, but then you may proceed to your regular diet.  Drink plenty of fluids but you should avoid alcoholic beverages for 24 hours.  ACTIVITY:  You should plan to take it easy  for the rest of today and you should NOT DRIVE or use heavy machinery until tomorrow (because of the sedation medicines used during the test).    FOLLOW UP: Our staff will call the number listed on your records 48-72 hours following your procedure to check on you and address any questions or concerns that you may have regarding the information given to you following your procedure. If we do not reach you, we will leave a message.  We will attempt to reach you two times.  During this call, we will ask if you have developed any symptoms of COVID 19. If you develop any symptoms (ie: fever, flu-like symptoms, shortness of breath, cough etc.) before then, please call [phone].  If you test positive for Covid 19 in the 2 weeks post procedure, please call and report this information to us.    If any biopsies were taken you will be contacted by phone or by letter within the next 1-3 weeks.  Please call us at [phone] if you have not heard about the biopsies in 3 weeks.    SIGNATURES/CONFIDENTIALITY: You and/or your care partner have signed paperwork which will be entered into your electronic medical record.  These signatures attest to the fact that that the information above on your After Visit Summary has been reviewed and is understood.  Full responsibility of the confidentiality of this discharge information lies with you and/or your care-partner.  

## 2021-02-20 NOTE — Progress Notes (Signed)
Report to PACU, RN, vss, BBS= Clear.  

## 2021-02-20 NOTE — Op Note (Signed)
[facility] Patient Name: Stacy Wolf Procedure Date: 02/20/2021 8:26 AM MRN: [id number] Endoscopist: Jerene Bears , MD Age: 45 Referring MD:  Date of Birth: 1975/05/11 Gender: Female Account #: [id number] Procedure:                Colonoscopy Indications:              Screening for colorectal malignant neoplasm, This                            is the patient's first colonoscopy Medicines:                Monitored Anesthesia Care Procedure:                Pre-Anesthesia Assessment:                           - Prior to the procedure, a History and Physical                            was performed, and patient medications and                            allergies were reviewed. The patient's tolerance of                            previous anesthesia was also reviewed. The risks                            and benefits of the procedure and the sedation                            options and risks were discussed with the patient.                            All questions were answered, and informed consent                            was obtained. Prior Anticoagulants: The patient has                            taken no previous anticoagulant or antiplatelet                            agents. ASA Grade Assessment: II - A patient with                            mild systemic disease. After reviewing the risks                            and benefits, the patient was deemed in                            satisfactory condition to undergo the procedure.  After obtaining informed consent, the colonoscope                            was passed under direct vision. Throughout the                            procedure, the patient's blood pressure, pulse, and                            oxygen saturations were monitored continuously. The                            PCF-HQ190L Colonoscope was introduced through the                            anus and advanced to the  cecum, identified by                            appendiceal orifice and ileocecal valve. The                            colonoscopy was performed without difficulty. The                            patient tolerated the procedure well. The quality                            of the bowel preparation was excellent. The                            ileocecal valve, appendiceal orifice, and rectum                            were photographed. Scope In: 8:36:48 AM Scope Out: 8:50:25 AM Scope Withdrawal Time: 0 hours 11 minutes 50 seconds  Total Procedure Duration: 0 hours 13 minutes 37 seconds  Findings:                 The digital rectal exam was normal.                           A 2 mm polyp was found in the hepatic flexure. The                            polyp was sessile. The polyp was removed with a                            cold snare. Resection and retrieval were complete.                           A 4 mm polyp was found in the distal sigmoid colon.                            The polyp was sessile. The polyp  was removed with a                            cold snare. Resection and retrieval were complete.                           A few small and large-mouthed diverticula were                            found in the hepatic flexure and ascending colon.                           The retroflexed view of the distal rectum and anal                            verge was normal and showed no anal or rectal                            abnormalities. Complications:            No immediate complications. Estimated Blood Loss:     Estimated blood loss: none. Impression:               - One 2 mm polyp at the hepatic flexure, removed                            with a cold snare. Resected and retrieved.                           - One 4 mm polyp in the distal sigmoid colon,                            removed with a cold snare. Resected and retrieved.                           - Diverticulosis at the  hepatic flexure and in the                            ascending colon.                           - The distal rectum and anal verge are normal on                            retroflexion view. Recommendation:           - Patient has a contact number available for                            emergencies. The signs and symptoms of potential                            delayed complications were discussed with the  patient. Return to normal activities tomorrow.                            Written discharge instructions were provided to the                            patient.                           - Resume previous diet.                           - Continue present medications.                           - Await pathology results.                           - Repeat colonoscopy is recommended. The                            colonoscopy date will be determined after pathology                            results from today's exam become available for                            review. Jerene Bears, MD 02/20/2021 8:53:19 AM This report has been signed electronically.

## 2021-02-20 NOTE — Progress Notes (Signed)
Pt's states no medical or surgical changes since previsit or office visit. 

## 2021-02-20 NOTE — Progress Notes (Signed)
GASTROENTEROLOGY PROCEDURE H&P NOTE   Primary Care Physician: Dettinger, Fransisca Kaufmann, MD    Reason for Procedure:  Colon cancer screening  Plan:    Colonoscopy  Patient is appropriate for endoscopic procedure(s) in the ambulatory ([facility]) setting.  The nature of the procedure, as well as the risks, benefits, and alternatives were carefully and thoroughly reviewed with the patient. Ample time for discussion and questions allowed. The patient understood, was satisfied, and agreed to proceed.     HPI: Stacy Wolf is a 45 y.o. female who presents for screening colonoscopy.  Medical history as below.  Tolerated the prep.  No chest pain or shortness of breath recently.  No abdominal pain today.  Past Medical History:  Diagnosis Date   Anxiety    on meds   Bipolar 1 disorder ([place])    Blood transfusion without reported diagnosis 2000   Depression    on meds   GERD (gastroesophageal reflux disease)    on meds   Glaucoma    LEFT eye- not a surgical candidate at this time (02/06/2021)   Heart murmur    Hypertension    on meds   Migraines    Schizophrenia ([place])    Seasonal allergies     Past Surgical History:  Procedure Laterality Date   BILATERAL SALPINGECTOMY Bilateral 08/29/2016   Procedure: BILATERAL SALPINGECTOMY;  Surgeon: Florian Buff, MD;  Location: [facility];  Service: Gynecology;  Laterality: Bilateral;   CARPAL TUNNEL RELEASE Left 04/11/2016   Procedure: CARPAL TUNNEL RELEASE;  Surgeon: [name], MD;  Location: [facility];  Service: Orthopedics;  Laterality: Left;   CARPAL TUNNEL RELEASE Right 03/13/2018   Procedure: RIGHT CARPAL TUNNEL RELEASE;  Surgeon: [name], MD;  Location: [facility];  Service: Orthopedics;  Laterality: Right;   SUPRACERVICAL ABDOMINAL HYSTERECTOMY N/A 08/29/2016   Procedure: HYSTERECTOMY SUPRACERVICAL ABDOMINAL;  Surgeon: Florian Buff, MD;  Location: [facility];  Service: Gynecology;  Laterality: N/A;   TRIGGER FINGER RELEASE Right  11/18/2018   Procedure: RELEASE TRIGGER FINGER/A-1 PULLEY right ring;  Surgeon: [name], MD;  Location: [facility];  Service: Orthopedics;  Laterality: Right;   TUBAL LIGATION     WISDOM TOOTH EXTRACTION     WRIST SURGERY Right 2006   tendonitis    Prior to Admission medications   Medication Sig Start Date End Date Taking? Authorizing Provider  amLODipine (NORVASC) 5 MG tablet Take 1 tablet (5 mg total) by mouth daily. 08/19/20  Yes Dettinger, Fransisca Kaufmann, MD  estradiol (ESTRACE) 2 MG tablet Take 1 tablet (2 mg total) by mouth at bedtime. 08/19/20  Yes Dettinger, Fransisca Kaufmann, MD  Lansoprazole (PREVACID PO) Take 1 tablet by mouth daily at 6 (six) AM.   Yes [provider]  lisinopril (ZESTRIL) 5 MG tablet Take 1 tablet (5 mg total) by mouth daily. 08/19/20  Yes Dettinger, Fransisca Kaufmann, MD  aspirin-acetaminophen-caffeine (EXCEDRIN MIGRAINE) [id number] MG tablet Take 2 tablets by mouth every 6 (six) hours as needed for headache or migraine.     [provider]  cyclobenzaprine (FLEXERIL) 10 MG tablet Take 1 tablet (10 mg total) by mouth 3 (three) times daily as needed for muscle spasms. 10/07/20   Baruch Gouty, FNP  fluticasone (FLONASE) 50 MCG/ACT nasal spray Place 1 spray into both nostrils 2 (two) times daily as needed for allergies or rhinitis. 06/06/20   Dettinger, Fransisca Kaufmann, MD  meloxicam (MOBIC) 7.5 MG tablet Take 1 tablet (7.5 mg total) by  mouth daily. 12/12/20   Gwenlyn Perking, FNP  naproxen sodium (ALEVE) 220 MG tablet Take 2 tablets (440 mg total) by mouth 2 (two) times daily as needed. 11/25/20   Dettinger, Fransisca Kaufmann, MD    Current Outpatient Medications  Medication Sig Dispense Refill   amLODipine (NORVASC) 5 MG tablet Take 1 tablet (5 mg total) by mouth daily. 90 tablet 3   estradiol (ESTRACE) 2 MG tablet Take 1 tablet (2 mg total) by mouth at bedtime. 90 tablet 4   Lansoprazole (PREVACID PO) Take 1 tablet by mouth daily at 6 (six) AM.     lisinopril (ZESTRIL) 5 MG  tablet Take 1 tablet (5 mg total) by mouth daily. 90 tablet 3   aspirin-acetaminophen-caffeine (EXCEDRIN MIGRAINE) 250-250-65 MG tablet Take 2 tablets by mouth every 6 (six) hours as needed for headache or migraine.      cyclobenzaprine (FLEXERIL) 10 MG tablet Take 1 tablet (10 mg total) by mouth 3 (three) times daily as needed for muscle spasms. 30 tablet 1   fluticasone (FLONASE) 50 MCG/ACT nasal spray Place 1 spray into both nostrils 2 (two) times daily as needed for allergies or rhinitis. 16 g 6   meloxicam (MOBIC) 7.5 MG tablet Take 1 tablet (7.5 mg total) by mouth daily. 30 tablet 0   naproxen sodium (ALEVE) 220 MG tablet Take 2 tablets (440 mg total) by mouth 2 (two) times daily as needed. 60 tablet 1   Current Facility-Administered Medications  Medication Dose Route Frequency Provider Last Rate Last Admin   0.9 %  sodium chloride infusion  500 mL Intravenous Once Justyne Roell, Lajuan Lines, MD        Allergies as of 02/20/2021 - Review Complete 02/20/2021  Allergen Reaction Noted   Imitrex [sumatriptan] Anaphylaxis and Other (See Comments) 06/28/2012   Anette Guarneri [lurasidone hcl] Other (See Comments) 08/25/2013   Wellbutrin [bupropion] Other (See Comments) 05/14/2012   Zoloft [sertraline hcl] Hives 05/14/2012   Haloperidol and related Other (See Comments) 08/25/2013   Nicoderm [nicotine] Dermatitis 04/18/2015    Family History  Problem Relation Age of Onset   Breast cancer Mother    Cancer Mother 68       breast CA at 41 and uterine CA at 78   Hypertension Mother    Kidney disease Mother        kidney transplant   Diabetes Father    Liver disease Father    Cancer Sister 13       uterine   Cancer Sister 64       uterine   Breast cancer Maternal Aunt    Cancer Maternal Aunt        breast   Colon polyps Neg Hx    Colon cancer Neg Hx    Esophageal cancer Neg Hx    Rectal cancer Neg Hx    Stomach cancer Neg Hx     Social History   Socioeconomic History   Marital status: Divorced     Spouse name: Not on file   Number of children: 3   Years of education: Not on file   Highest education level: 11th grade  Occupational History   Not on file  Tobacco Use   Smoking status: Every Day    Packs/day: 0.25    Years: 7.00    Pack years: 1.75    Types: Cigars, Cigarettes    Last attempt to quit: 11/05/2017    Years since quitting: 3.2   Smokeless tobacco: Never   Tobacco  comments:    1 cigar per day  Vaping Use   Vaping Use: Never used  Substance and Sexual Activity   Alcohol use: Not Currently    Alcohol/week: 0.0 - 1.0 standard drinks    Comment: last used 10 days ago   Drug use: Not Currently    Frequency: 4.0 times per week    Types: Marijuana   Sexual activity: Yes    Birth control/protection: Surgical    Comment: supracervical hyst  Other Topics Concern   Not on file  Social History Narrative   Not on file   Social Determinants of Health   Financial Resource Strain: Not on file  Food Insecurity: Not on file  Transportation Needs: Not on file  Physical Activity: Not on file  Stress: Not on file  Social Connections: Not on file  Intimate Partner Violence: Not on file    Physical Exam: Vital signs in last 24 hours: @BP  111/71    Pulse 61    Temp (!) 96.6 F (35.9 C)    Ht 5\' 3"  (1.6 m)    Wt 172 lb (78 kg)    LMP 08/10/2016 Comment: [place]   SpO2 98%    BMI 30.47 kg/m  GEN: NAD EYE: Sclerae anicteric ENT: MMM CV: Non-tachycardic Pulm: CTA b/l GI: Soft, NT/ND NEURO:  Alert & Oriented x 3   Zenovia Jarred, MD [facility]  02/20/2021 8:28 AM

## 2021-02-20 NOTE — Progress Notes (Signed)
[place] vital signs. 

## 2021-02-22 ENCOUNTER — Other Ambulatory Visit: Payer: Self-pay

## 2021-02-22 ENCOUNTER — Ambulatory Visit: Payer: Medicaid Other

## 2021-02-22 ENCOUNTER — Telehealth: Payer: Self-pay

## 2021-02-22 DIAGNOSIS — M25561 Pain in right knee: Secondary | ICD-10-CM | POA: Diagnosis not present

## 2021-02-22 DIAGNOSIS — M25661 Stiffness of right knee, not elsewhere classified: Secondary | ICD-10-CM

## 2021-02-22 NOTE — Therapy (Signed)
[facility] [place], [place], [address] Phone: [phone]   Fax:  [phone]  Physical Therapy Treatment  Patient Details  Name: Stacy Wolf MRN: [id number] Date of Birth: 06-16-1975 Referring Provider (PT): Ronnie Doss DO   Encounter Date: 02/22/2021   PT End of Session - 02/22/21 1141     Visit Number 4    Number of Visits 12    Date for PT Re-Evaluation 03/14/21    PT Start Time 1121    PT Stop Time 1158    PT Time Calculation (min) 37 min    Activity Tolerance Patient tolerated treatment well    Behavior During Therapy [facility] for tasks assessed/performed             Past Medical History:  Diagnosis Date   Anxiety    on meds   Bipolar 1 disorder ([place])    Blood transfusion without reported diagnosis 2000   Depression    on meds   GERD (gastroesophageal reflux disease)    on meds   Glaucoma    LEFT eye- not a surgical candidate at this time (02/06/2021)   Heart murmur    Hypertension    on meds   Migraines    Schizophrenia ([place])    Seasonal allergies     Past Surgical History:  Procedure Laterality Date   BILATERAL SALPINGECTOMY Bilateral 08/29/2016   Procedure: BILATERAL SALPINGECTOMY;  Surgeon: Florian Buff, MD;  Location: [facility];  Service: Gynecology;  Laterality: Bilateral;   CARPAL TUNNEL RELEASE Left 04/11/2016   Procedure: CARPAL TUNNEL RELEASE;  Surgeon: [name], MD;  Location: [facility];  Service: Orthopedics;  Laterality: Left;   CARPAL TUNNEL RELEASE Right 03/13/2018   Procedure: RIGHT CARPAL TUNNEL RELEASE;  Surgeon: [name], MD;  Location: [facility];  Service: Orthopedics;  Laterality: Right;   SUPRACERVICAL ABDOMINAL HYSTERECTOMY N/A 08/29/2016   Procedure: HYSTERECTOMY SUPRACERVICAL ABDOMINAL;  Surgeon: Florian Buff, MD;  Location: [facility];  Service: Gynecology;  Laterality: N/A;   TRIGGER FINGER RELEASE Right 11/18/2018   Procedure: RELEASE TRIGGER  FINGER/A-1 PULLEY right ring;  Surgeon: [name], MD;  Location: [facility];  Service: Orthopedics;  Laterality: Right;   TUBAL LIGATION     WISDOM TOOTH EXTRACTION     WRIST SURGERY Right 2006   tendonitis    There were no vitals filed for this visit.   Subjective Assessment - 02/22/21 1120     Subjective COVID-19 screen performed prior to patient entering clinic.  Patient reports that her knee is not hurting right now. She notes that she had an appointment on Monday that caused her to have difficulty getting her leg into the car, but she is unsure why.    Pertinent History Left CTR, Bipolar 1.    How long can you walk comfortably? Can walk over the length of her workshift but her knee hurts a lot and swells.    Patient Stated Goals Get out of pain.    Currently in Pain? No/denies    Pain Onset More than a month ago                               [facility] Adult PT Treatment/Exercise - 02/22/21 0001       Knee/Hip Exercises: Stretches   Hip Flexor Stretch Right;4 reps;30 seconds   supine     Knee/Hip Exercises: Aerobic   Recumbent  Bike L4 x 15 minutes      Knee/Hip Exercises: Machines for Strengthening   Cybex Knee Extension 20 lbs x 2x11 reps    Cybex Knee Flexion 20 lbs; 3x12 reps      Knee/Hip Exercises: Standing   Functional Squat 1 set;10 reps                       PT Short Term Goals - 02/09/21 0733       PT SHORT TERM GOAL #1   Title Patient will be independent with her initial HEP.    Baseline gastroc stretch and long arc quad (provided on 02/07/21)    Time 1    Period Weeks    Status Achieved    Target Date 02/10/21               PT Long Term Goals - 02/09/21 0733       PT LONG TERM GOAL #1   Title Independent with a HEP.    Baseline No knowledge of appropriate ther ex.    Time 6    Period Weeks    Status On-going      PT LONG TERM GOAL #2   Title Right knee active flexion to 115 degrees.    Baseline 105  degrees.    Time 6    Period Weeks    Status On-going      PT LONG TERM GOAL #3   Title Perform ADL's with right knee pain not > 2-3/10.    Baseline Pain increases to high levels whne performing ADL's.    Time 6    Period Weeks    Status On-going      PT LONG TERM GOAL #4   Title Walk a community distance with right knee pain not > 2-3/10.    Baseline Pain increases to high levels with increased walking.    Time 6    Period Weeks    Status On-going                   Plan - 02/22/21 1142     Clinical Impression Statement Patient was progressed with multiple new interventions for improved knee mobility and strength. She required minimal cuing with resisted knee flexion and extension for improved eccentric control with both of these interventions for improved knee stability. She experienced a mild increase in her familiar knee pain at the conclusion of the first set of the knee extension. However, this did not limit her ability to complete any of today's other interventions. She continues to require skilled physical therapy to address her remaining impairments to return to her prior level of function.    Personal Factors and Comorbidities Comorbidity 1;Other    Examination-Activity Limitations Locomotion Level;Stand;Other    Examination-Participation Restrictions Other    Stability/Clinical Decision Making Stable/Uncomplicated    Rehab Potential Excellent    PT Frequency 2x / week    PT Duration 6 weeks    PT Treatment/Interventions ADLs/Self Care Home Management;Cryotherapy;Electrical Stimulation;Ultrasound;Moist Heat;Iontophoresis 4mg /ml Dexamethasone;Functional mobility training;Therapeutic activities;Therapeutic exercise;Neuromuscular re-education;Manual techniques;Patient/family education;Passive range of motion;Dry needling;Vasopneumatic Device    PT Next Visit Plan Recumbent bike, pain-free right LE ther ex (O and CKC), modalities as needed.    Consulted and Agree with  Plan of Care Patient             Patient will benefit from skilled therapeutic intervention in order to improve the following deficits and impairments:  Pain, Decreased activity  tolerance, Decreased strength, Decreased range of motion  Visit Diagnosis: Chronic pain of right knee  Stiffness of right knee, not elsewhere classified     Problem List Patient Active Problem List   Diagnosis Date Noted   Status post partial hysterectomy 08/19/2020   Current use of estrogen therapy 01/20/2019   Posttraumatic stress disorder 10/17/2016   Personality disorder ([place]) 10/17/2016   Menorrhagia with irregular cycle 08/13/2016   Obesity (BMI 30.0-34.9) 04/23/2016   Essential hypertension, benign 01/16/2016   Migraine headache 04/18/2015   Tobacco abuse 02/02/2015   Constipation 02/02/2015   Gastroesophageal reflux disease without esophagitis 08/24/2014   Hyperlipidemia LDL goal <100 09/14/2013   Bipolar disorder ([place]) 08/25/2013    Darlin Coco, PT 02/22/2021, 12:20 PM  [facility] [address] [place], [place], [address] Phone: [phone]   Fax:  [phone]  Name: Stacy Wolf MRN: [id number] Date of Birth: Jul 01, 1975

## 2021-02-22 NOTE — Telephone Encounter (Signed)
°  Follow up Call-  Call back number 02/20/2021  Post procedure Call Back phone  # [phone]  Permission to leave phone message Yes  Some recent data might be hidden     Patient questions:  Do you have a fever, pain , or abdominal swelling? No. Pain Score  0 *  Have you tolerated food without any problems? Yes.    Have you been able to return to your normal activities? Yes.    Do you have any questions about your discharge instructions: Diet   No. Medications  No. Follow up visit  No.  Do you have questions or concerns about your Care? No.  Actions: * If pain score is 4 or above: No action needed, pain <4.  Have you developed a fever since your procedure? No   2.   Have you had an respiratory symptoms (SOB or cough) since your procedure? no  3.   Have you tested positive for COVID 19 since your procedure no  4.   Have you had any family members/close contacts diagnosed with the COVID 19 since your procedure?  no   If yes to any of these questions please route to Joylene John, RN and Joella Prince, RN

## 2021-02-23 ENCOUNTER — Encounter: Payer: Self-pay | Admitting: Internal Medicine

## 2021-02-28 ENCOUNTER — Ambulatory Visit: Payer: Medicaid Other | Admitting: Physical Therapy

## 2021-02-28 ENCOUNTER — Other Ambulatory Visit: Payer: Self-pay

## 2021-02-28 DIAGNOSIS — M25561 Pain in right knee: Secondary | ICD-10-CM | POA: Diagnosis not present

## 2021-02-28 DIAGNOSIS — M25661 Stiffness of right knee, not elsewhere classified: Secondary | ICD-10-CM

## 2021-02-28 NOTE — Therapy (Signed)
[facility] [place], [place], [address] Phone: [phone]   Fax:  [phone]  Physical Therapy Treatment  Patient Details  Name: Stacy Wolf MRN: [id number] Date of Birth: 1975-08-09 Referring Provider (PT): Ronnie Doss DO   Encounter Date: 02/28/2021   PT End of Session - 02/28/21 1547     Visit Number 5    Number of Visits 12    Date for PT Re-Evaluation 03/14/21    PT Start Time 0230    PT Stop Time 0317    PT Time Calculation (min) 47 min    Activity Tolerance Patient tolerated treatment well    Behavior During Therapy [facility] for tasks assessed/performed             Past Medical History:  Diagnosis Date   Anxiety    on meds   Bipolar 1 disorder ([place])    Blood transfusion without reported diagnosis 2000   Depression    on meds   GERD (gastroesophageal reflux disease)    on meds   Glaucoma    LEFT eye- not a surgical candidate at this time (02/06/2021)   Heart murmur    Hypertension    on meds   Migraines    Schizophrenia ([place])    Seasonal allergies     Past Surgical History:  Procedure Laterality Date   BILATERAL SALPINGECTOMY Bilateral 08/29/2016   Procedure: BILATERAL SALPINGECTOMY;  Surgeon: Florian Buff, MD;  Location: [facility];  Service: Gynecology;  Laterality: Bilateral;   CARPAL TUNNEL RELEASE Left 04/11/2016   Procedure: CARPAL TUNNEL RELEASE;  Surgeon: [name], MD;  Location: [facility];  Service: Orthopedics;  Laterality: Left;   CARPAL TUNNEL RELEASE Right 03/13/2018   Procedure: RIGHT CARPAL TUNNEL RELEASE;  Surgeon: [name], MD;  Location: [facility];  Service: Orthopedics;  Laterality: Right;   SUPRACERVICAL ABDOMINAL HYSTERECTOMY N/A 08/29/2016   Procedure: HYSTERECTOMY SUPRACERVICAL ABDOMINAL;  Surgeon: Florian Buff, MD;  Location: [facility];  Service: Gynecology;  Laterality: N/A;   TRIGGER FINGER RELEASE Right 11/18/2018   Procedure: RELEASE TRIGGER  FINGER/A-1 PULLEY right ring;  Surgeon: [name], MD;  Location: [facility];  Service: Orthopedics;  Laterality: Right;   TUBAL LIGATION     WISDOM TOOTH EXTRACTION     WRIST SURGERY Right 2006   tendonitis    There were no vitals filed for this visit.   Subjective Assessment - 02/28/21 1548     Subjective COVID-19 screen performed prior to patient entering clinic.  Hurting since last treatment ("10") and left knee a "7".    Pertinent History Left CTR, Bipolar 1.    How long can you walk comfortably? Can walk over the length of her workshift but her knee hurts a lot and swells.    Patient Stated Goals Get out of pain.    Currently in Pain? Yes    Pain Score 10-Worst pain ever    Pain Location Knee    Pain Orientation Right    Pain Descriptors / Indicators Aching;Throbbing    Pain Type Chronic pain    Pain Onset More than a month ago                               [facility] Adult PT Treatment/Exercise - 02/28/21 0001       Exercises   Exercises Knee/Hip      Knee/Hip Exercises: Aerobic  Recumbent Bike Level 3 x 10 minutes.      Modalities   Modalities Merchandiser, retail Stimulation Location Right lateral knee.    Electrical Stimulation Action Pre-mod.    Electrical Stimulation Parameters 80-150 Hz. x 20 minutes.    Electrical Stimulation Goals Pain      Ultrasound   Ultrasound Location Affected right lateral knee region.    Ultrasound Parameters Combo e'stim/US at 1.20 W/CM2, 3.3 Mhz at 50% x 8 minutes.      Vasopneumatic   Number Minutes Vasopneumatic  20 minutes    Vasopnuematic Location  --   Right knee.   Vasopneumatic Pressure Low                       PT Short Term Goals - 02/09/21 0733       PT SHORT TERM GOAL #1   Title Patient will be independent with her initial HEP.    Baseline gastroc stretch and long arc quad (provided on 02/07/21)    Time 1     Period Weeks    Status Achieved    Target Date 02/10/21               PT Long Term Goals - 02/09/21 0733       PT LONG TERM GOAL #1   Title Independent with a HEP.    Baseline No knowledge of appropriate ther ex.    Time 6    Period Weeks    Status On-going      PT LONG TERM GOAL #2   Title Right knee active flexion to 115 degrees.    Baseline 105 degrees.    Time 6    Period Weeks    Status On-going      PT LONG TERM GOAL #3   Title Perform ADL's with right knee pain not > 2-3/10.    Baseline Pain increases to high levels whne performing ADL's.    Time 6    Period Weeks    Status On-going      PT LONG TERM GOAL #4   Title Walk a community distance with right knee pain not > 2-3/10.    Baseline Pain increases to high levels with increased walking.    Time 6    Period Weeks    Status On-going                   Plan - 02/28/21 1551     Clinical Impression Statement Patient reporting severe right knee pain today.  Conservative treatment today with patient feeling better following.    Personal Factors and Comorbidities Comorbidity 1;Other    Examination-Activity Limitations Locomotion Level;Stand;Other    Examination-Participation Restrictions Other    Stability/Clinical Decision Making Stable/Uncomplicated    Rehab Potential Excellent    PT Frequency 2x / week    PT Duration 6 weeks    PT Treatment/Interventions ADLs/Self Care Home Management;Cryotherapy;Electrical Stimulation;Ultrasound;Moist Heat;Iontophoresis 4mg /ml Dexamethasone;Functional mobility training;Therapeutic activities;Therapeutic exercise;Neuromuscular re-education;Manual techniques;Patient/family education;Passive range of motion;Dry needling;Vasopneumatic Device    PT Next Visit Plan Recumbent bike, pain-free right LE ther ex (O and CKC), modalities as needed.    Consulted and Agree with Plan of Care Patient             Patient will benefit from skilled therapeutic  intervention in order to improve the following deficits and impairments:  Pain, Decreased activity tolerance, Decreased strength, Decreased range  of motion  Visit Diagnosis: Chronic pain of right knee  Stiffness of right knee, not elsewhere classified     Problem List Patient Active Problem List   Diagnosis Date Noted   Status post partial hysterectomy 08/19/2020   Current use of estrogen therapy 01/20/2019   Posttraumatic stress disorder 10/17/2016   Personality disorder ([place]) 10/17/2016   Menorrhagia with irregular cycle 08/13/2016   Obesity (BMI 30.0-34.9) 04/23/2016   Essential hypertension, benign 01/16/2016   Migraine headache 04/18/2015   Tobacco abuse 02/02/2015   Constipation 02/02/2015   Gastroesophageal reflux disease without esophagitis 08/24/2014   Hyperlipidemia LDL goal <100 09/14/2013   Bipolar disorder ([place]) 08/25/2013    Ineze Serrao, [place], PT 02/28/2021, 3:53 PM  [facility] [address] [place], [place], [address] Phone: [phone]   Fax:  [phone]  Name: Stacy Wolf MRN: [id number] Date of Birth: [birth year]

## 2021-03-01 ENCOUNTER — Telehealth: Payer: Self-pay | Admitting: *Deleted

## 2021-03-01 NOTE — Telephone Encounter (Signed)
Diclofenac sod DR 75 mg tabs - Denied coverage by medicaid.  These are what is covered: celecoxib capsule (generic for Celebrex ibuprofen suspension / tablet (generic for Motrin indomethacin capsule (generic for Indocin ketorolac tablet (generic for Toradol meloxicam tablet (generic for Mobic Tablet naproxen EC / DR tablet (generic for Naprosyn  EC) naproxen tablet (generic for Naprosyn  Tablet sulindac tablet (generic for Clinoril   MUST try at least 2 of these and fail  Will but the OTC after reading providers note.    Pt currently trying the mobic and naproxen - if she calls back with failure, we can attempt PA

## 2021-03-02 ENCOUNTER — Other Ambulatory Visit: Payer: Self-pay

## 2021-03-02 ENCOUNTER — Ambulatory Visit: Payer: Medicaid Other | Admitting: Physical Therapy

## 2021-03-02 ENCOUNTER — Encounter: Payer: Self-pay | Admitting: Physical Therapy

## 2021-03-02 NOTE — Therapy (Addendum)
[facility] [place], [place], [address] Phone: [phone]   Fax:  [phone]  Patient Details  Name: Stacy Wolf MRN: [id number] Date of Birth: [birth year] Referring Provider:  Dettinger, Fransisca Kaufmann, MD  Encounter Date: 03/02/2021  Patient presented in clinic with continued R knee pain but also beginning to experience L knee pain due to compensation during standing. Patient indicates that most pain is predominately while static standing for prolonged periods such as while cooking or washing dishes. Patient able to achieve most goals and exceed R knee flexion goals at 140 deg. Patient was tender to palpation over the lateral condyle of the R femur. Patient reports compliance with HEP and was encouraged to continue HEP and use ice and heat rotation for 15-20 minutes max. Patient verbalized understanding of all instruction for pain management and HEP. D/C for MCD authorization expiration.  Standley Brooking, PTA 03/02/2021, 8:24 AM  [facility] [address] [place], [place], [address] Phone: [phone]   Fax:  [phone]

## 2021-03-04 ENCOUNTER — Other Ambulatory Visit: Payer: Self-pay | Admitting: Family Medicine

## 2021-03-04 DIAGNOSIS — M25561 Pain in right knee: Secondary | ICD-10-CM

## 2021-03-08 ENCOUNTER — Encounter: Payer: Self-pay | Admitting: Nurse Practitioner

## 2021-03-08 ENCOUNTER — Ambulatory Visit: Payer: Medicaid Other | Admitting: Nurse Practitioner

## 2021-03-08 VITALS — BP 101/67 | HR 81 | Temp 98.4°F | Ht 63.0 in | Wt 174.5 lb

## 2021-03-08 DIAGNOSIS — L0291 Cutaneous abscess, unspecified: Secondary | ICD-10-CM | POA: Insufficient documentation

## 2021-03-08 MED ORDER — CEPHALEXIN 500 MG PO CAPS: 500.0000 mg | ORAL_CAPSULE | Freq: Two times a day (BID) | ORAL | 0 refills | Status: DC

## 2021-03-08 NOTE — Patient Instructions (Signed)
Skin Abscess  A skin abscess is an infected area on or under your skin that contains a collection of pus and other material. An abscess may also be called a furuncle,carbuncle, or boil. An abscess can occur in or on almost any part of your body. Some abscesses break open (rupture) on their own. Most continue to get worse unless they are treated. The infection can spread deeper into the body and eventually into your blood, whichcan make you feel ill. Treatment usually involves draining the abscess. What are the causes? An abscess occurs when germs, like bacteria, pass through your skin and cause an infection. This may be caused by: A scrape or cut on your skin. A puncture wound through your skin, including a needle injection or insect bite. Blocked oil or sweat glands. Blocked and infected hair follicles. A cyst that forms beneath your skin (sebaceous cyst) and becomes infected. What increases the risk? This condition is more likely to develop in people who: Have a weak body defense system (immune system). Have diabetes. Have dry and irritated skin. Get frequent injections or use illegal IV drugs. Have a foreign body in a wound, such as a splinter. Have problems with their lymph system or veins. What are the signs or symptoms? Symptoms of this condition include: A painful, firm bump under the skin. A bump with pus at the top. This may break through the skin and drain. Other symptoms include: Redness surrounding the abscess site. Warmth. Swelling of the lymph nodes (glands) near the abscess. Tenderness. A sore on the skin. How is this diagnosed? This condition may be diagnosed based on: A physical exam. Your medical history. A sample of pus. This may be used to find out what is causing the infection. Blood tests. Imaging tests, such as an ultrasound, CT scan, or MRI. How is this treated? A small abscess that drains on its own may not need treatment. Treatment for larger abscesses  may include: Moist heat or heat pack applied to the area several times a day. A procedure to drain the abscess (incision and drainage). Antibiotic medicines. For a severe abscess, you may first get antibiotics through an IV and then change to antibiotics by mouth. Follow these instructions at home: Medicines  Take over-the-counter and prescription medicines only as told by your health care provider. If you were prescribed an antibiotic medicine, take it as told by your health care provider. Do not stop taking the antibiotic even if you start to feel better.  Abscess care  If you have an abscess that has not drained, apply heat to the affected area. Use the heat source that your health care provider recommends, such as a moist heat pack or a heating pad. Place a towel between your skin and the heat source. Leave the heat on for 20-30 minutes. Remove the heat if your skin turns bright red. This is especially important if you are unable to feel pain, heat, or cold. You may have a greater risk of getting burned. Follow instructions from your health care provider about how to take care of your abscess. Make sure you: Cover the abscess with a bandage (dressing). Change your dressing or gauze as told by your health care provider. Wash your hands with soap and water before you change the dressing or gauze. If soap and water are not available, use hand sanitizer. Check your abscess every day for signs of a worsening infection. Check for: More redness, swelling, or pain. More fluid or blood. Warmth. More   pus or a bad smell.  General instructions To avoid spreading the infection: Do not share personal care items, towels, or hot tubs with others. Avoid making skin contact with other people. Keep all follow-up visits as told by your health care provider. This is important. Contact a health care provider if you have: More redness, swelling, or pain around your abscess. More fluid or blood coming  from your abscess. Warm skin around your abscess. More pus or a bad smell coming from your abscess. A fever. Muscle aches. Chills or a general ill feeling. Get help right away if you: Have severe pain. See red streaks on your skin spreading away from the abscess. Summary A skin abscess is an infected area on or under your skin that contains a collection of pus and other material. A small abscess that drains on its own may not need treatment. Treatment for larger abscesses may include having a procedure to drain the abscess and taking an antibiotic. This information is not intended to replace advice given to you by your health care provider. Make sure you discuss any questions you have with your healthcare provider. Document Revised: 06/12/2018 Document Reviewed: 04/04/2017 Elsevier Patient Education  2022 [employer] Inc.  

## 2021-03-08 NOTE — Assessment & Plan Note (Signed)
Abscess from ingrown hair in the past 4 days, pain and tenderness. Started patient on Keflex 500 mg two  Times a day. Warm compress, antiinflammatory for pain. Follow up with unresolved symptoms.  Education provided, printed hand out given.   RX sent to pharmacy.

## 2021-03-08 NOTE — Progress Notes (Signed)
Acute Office Visit  Subjective:    Patient ID: Stacy Wolf, female    DOB: [birth year], 46 y.o.   MRN: [id number]  Chief Complaint  Patient presents with   Cyst    HPI Patient is in today for  Abscess: Patient presents for evaluation of a cutaneous abscess. Lesion is located in the left inguinal region. Onset was 3 days ago. Symptoms have gradually worsened. Abscess has associated symptoms of pain. Patient does not have previous history of cutaneous abscesses. Patient does not have diabetes.   Past Medical History:  Diagnosis Date   Anxiety    on meds   Bipolar 1 disorder ([place])    Blood transfusion without reported diagnosis 2000   Depression    on meds   GERD (gastroesophageal reflux disease)    on meds   Glaucoma    LEFT eye- not a surgical candidate at this time (02/06/2021)   Heart murmur    Hypertension    on meds   Migraines    Schizophrenia ([place])    Seasonal allergies     Past Surgical History:  Procedure Laterality Date   BILATERAL SALPINGECTOMY Bilateral 08/29/2016   Procedure: BILATERAL SALPINGECTOMY;  Surgeon: Florian Buff, MD;  Location: [facility];  Service: Gynecology;  Laterality: Bilateral;   CARPAL TUNNEL RELEASE Left 04/11/2016   Procedure: CARPAL TUNNEL RELEASE;  Surgeon: [name], MD;  Location: [facility];  Service: Orthopedics;  Laterality: Left;   CARPAL TUNNEL RELEASE Right 03/13/2018   Procedure: RIGHT CARPAL TUNNEL RELEASE;  Surgeon: [name], MD;  Location: [facility];  Service: Orthopedics;  Laterality: Right;   SUPRACERVICAL ABDOMINAL HYSTERECTOMY N/A 08/29/2016   Procedure: HYSTERECTOMY SUPRACERVICAL ABDOMINAL;  Surgeon: Florian Buff, MD;  Location: [facility];  Service: Gynecology;  Laterality: N/A;   TRIGGER FINGER RELEASE Right 11/18/2018   Procedure: RELEASE TRIGGER FINGER/A-1 PULLEY right ring;  Surgeon: [name], MD;  Location: [facility];  Service: Orthopedics;  Laterality: Right;   TUBAL LIGATION     WISDOM TOOTH  EXTRACTION     WRIST SURGERY Right 2006   tendonitis    Family History  Problem Relation Age of Onset   Breast cancer Mother    Cancer Mother 26       breast CA at 59 and uterine CA at 64   Hypertension Mother    Kidney disease Mother        kidney transplant   Diabetes Father    Liver disease Father    Cancer Sister 27       uterine   Cancer Sister 57       uterine   Breast cancer Maternal Aunt    Cancer Maternal Aunt        breast   Colon polyps Neg Hx    Colon cancer Neg Hx    Esophageal cancer Neg Hx    Rectal cancer Neg Hx    Stomach cancer Neg Hx     Social History   Socioeconomic History   Marital status: Divorced    Spouse name: Not on file   Number of children: 3   Years of education: Not on file   Highest education level: 11th grade  Occupational History   Not on file  Tobacco Use   Smoking status: Every Day    Packs/day: 0.25    Years: 7.00    Pack years: 1.75    Types: Cigars, Cigarettes    Last attempt to quit:  11/05/2017    Years since quitting: 3.3   Smokeless tobacco: Never   Tobacco comments:    1 cigar per day  Vaping Use   Vaping Use: Never used  Substance and Sexual Activity   Alcohol use: Not Currently    Alcohol/week: 0.0 - 1.0 standard drinks    Comment: last used 10 days ago   Drug use: Not Currently    Frequency: 4.0 times per week    Types: Marijuana   Sexual activity: Yes    Birth control/protection: Surgical    Comment: supracervical hyst  Other Topics Concern   Not on file  Social History Narrative   Not on file   Social Determinants of Health   Financial Resource Strain: Not on file  Food Insecurity: Not on file  Transportation Needs: Not on file  Physical Activity: Not on file  Stress: Not on file  Social Connections: Not on file  Intimate Partner Violence: Not on file    Outpatient Medications Prior to Visit  Medication Sig Dispense Refill   amLODipine (NORVASC) 5 MG tablet Take 1 tablet (5 mg total) by  mouth daily. 90 tablet 3   aspirin-acetaminophen-caffeine (EXCEDRIN MIGRAINE) 250-250-65 MG tablet Take 2 tablets by mouth every 6 (six) hours as needed for headache or migraine.      cyclobenzaprine (FLEXERIL) 10 MG tablet Take 1 tablet (10 mg total) by mouth 3 (three) times daily as needed for muscle spasms. 30 tablet 1   estradiol (ESTRACE) 2 MG tablet Take 1 tablet (2 mg total) by mouth at bedtime. 90 tablet 4   fluticasone (FLONASE) 50 MCG/ACT nasal spray Place 1 spray into both nostrils 2 (two) times daily as needed for allergies or rhinitis. 16 g 6   Lansoprazole (PREVACID PO) Take 1 tablet by mouth daily at 6 (six) AM.     lisinopril (ZESTRIL) 5 MG tablet Take 1 tablet (5 mg total) by mouth daily. 90 tablet 3   meloxicam (MOBIC) 7.5 MG tablet TAKE 1 TABLET BY MOUTH EVERY DAY 30 tablet 0   naproxen sodium (ALEVE) 220 MG tablet Take 2 tablets (440 mg total) by mouth 2 (two) times daily as needed. 60 tablet 1   No facility-administered medications prior to visit.    Allergies  Allergen Reactions   Imitrex [Sumatriptan] Anaphylaxis and Other (See Comments)    Could not swallow after taking med   Latuda [Lurasidone Hcl] Other (See Comments)    Suicidal thoughts    Wellbutrin [Bupropion] Other (See Comments)    Seizures / lowered seizure threshold   Zoloft [Sertraline Hcl] Hives   Haloperidol And Related Other (See Comments)    Leg cramps   Nicoderm [Nicotine] Dermatitis    Review of Systems  HENT: Negative.    Eyes: Negative.   Respiratory: Negative.    Gastrointestinal: Negative.   Genitourinary: Negative.   All other systems reviewed and are negative.     Objective:    Physical Exam Vitals and nursing note reviewed.  Constitutional:      Appearance: Normal appearance.  HENT:     Head: Normocephalic.  Eyes:     Pupils: Pupils are equal, round, and reactive to light.  Cardiovascular:     Rate and Rhythm: Normal rate and regular rhythm.     Pulses: Normal pulses.      Heart sounds: Normal heart sounds.  Pulmonary:     Effort: Pulmonary effort is normal.     Breath sounds: Normal breath sounds.  Abdominal:  General: Bowel sounds are normal.  Skin:    General: Skin is warm.     Findings: Abscess present.          Comments: Abscess right inguinal area.  Neurological:     Mental Status: She is alert and oriented to person, place, and time.  Psychiatric:        Behavior: Behavior normal.    BP 101/67    Pulse 81    Temp 98.4 F (36.9 C) (Temporal)    Ht 5\' 3"  (1.6 m)    Wt 174 lb 8 oz (79.2 kg)    LMP 08/10/2016 Comment: [place]   BMI 30.91 kg/m  Wt Readings from Last 3 Encounters:  03/08/21 174 lb 8 oz (79.2 kg)  02/20/21 172 lb (78 kg)  02/06/21 172 lb (78 kg)    There are no preventive care reminders to display for this patient.  There are no preventive care reminders to display for this patient.   Lab Results  Component Value Date   TSH 0.664 10/21/2019   Lab Results  Component Value Date   WBC 6.9 10/21/2019   HGB 13.4 10/21/2019   HCT 38.4 10/21/2019   MCV 96 10/21/2019   PLT 212 10/21/2019   Lab Results  Component Value Date   NA 140 10/21/2019   K 4.2 10/21/2019   CO2 23 10/21/2019   GLUCOSE 90 10/21/2019   BUN 9 10/21/2019   CREATININE 0.73 10/21/2019   BILITOT 0.3 10/21/2019   ALKPHOS 84 10/21/2019   AST 15 10/21/2019   ALT 12 10/21/2019   PROT 6.5 10/21/2019   ALBUMIN 4.2 10/21/2019   CALCIUM 9.0 10/21/2019   ANIONGAP 9 11/14/2018   Lab Results  Component Value Date   CHOL 199 10/21/2019   Lab Results  Component Value Date   HDL 69 10/21/2019   Lab Results  Component Value Date   LDLCALC 113 (H) 10/21/2019   Lab Results  Component Value Date   TRIG 96 10/21/2019   Lab Results  Component Value Date   CHOLHDL 2.9 10/21/2019   Lab Results  Component Value Date   HGBA1C 6.0 04/23/2016       Assessment & Plan:   Problem List Items Addressed This Visit       Other   Abscess - Primary     Abscess from ingrown hair in the past 4 days, pain and tenderness. Started patient on Keflex 500 mg two  Times a day. Warm compress, antiinflammatory for pain. Follow up with unresolved symptoms.  Education provided, printed hand out given.   RX sent to pharmacy.         Relevant Medications   cephALEXin (KEFLEX) 500 MG capsule     Meds ordered this encounter  Medications   cephALEXin (KEFLEX) 500 MG capsule    Sig: Take 1 capsule (500 mg total) by mouth 2 (two) times daily.    Dispense:  14 capsule    Refill:  0    Order Specific Question:   Supervising Provider    Answer:   Claretta Fraise [[id number]]     Ivy Lynn, NP

## 2021-03-28 ENCOUNTER — Encounter: Payer: Self-pay | Admitting: Nurse Practitioner

## 2021-03-28 ENCOUNTER — Ambulatory Visit: Payer: Medicaid Other | Admitting: Nurse Practitioner

## 2021-03-28 ENCOUNTER — Other Ambulatory Visit (HOSPITAL_COMMUNITY)
Admission: RE | Admit: 2021-03-28 | Discharge: 2021-03-28 | Disposition: A | Discharge status: Home / Self Care | Payer: Medicaid Other | Source: Ambulatory Visit | Attending: Nurse Practitioner | Admitting: Nurse Practitioner

## 2021-03-28 VITALS — BP 124/81 | HR 74 | Ht 63.0 in | Wt 174.0 lb

## 2021-03-28 DIAGNOSIS — N898 Other specified noninflammatory disorders of vagina: Secondary | ICD-10-CM | POA: Insufficient documentation

## 2021-03-28 MED ORDER — FLUCONAZOLE 150 MG PO TABS: 150.0000 mg | ORAL_TABLET | Freq: Once | ORAL | 0 refills | Status: AC

## 2021-03-28 NOTE — Patient Instructions (Signed)

## 2021-03-28 NOTE — Addendum Note (Signed)
Addended by: Thana Ates on: 03/28/2021 09:17 AM   Modules accepted: Orders

## 2021-03-28 NOTE — Progress Notes (Signed)
Acute Office Visit  Subjective:    Patient ID: Stacy Wolf, female    DOB: 01/22/1976, 46 y.o.   MRN: [id number]  Chief Complaint  Patient presents with   Vaginal Discharge    Thick white, no itching    Vaginal Discharge The patient's primary symptoms include vaginal discharge. The patient's pertinent negatives include no genital itching, genital rash or pelvic pain. This is a new problem. Episode onset: in the past 3 days. The problem occurs constantly. The problem has been unchanged. The patient is experiencing no pain. Pertinent negatives include no abdominal pain, constipation, diarrhea, flank pain, nausea or urgency. The vaginal discharge was milky and white. There has been no bleeding. She has not been passing clots. She has not been passing tissue. She has tried antibiotics for the symptoms. The treatment provided no relief. She is sexually active. It is unknown whether or not her partner has an STD.    Past Medical History:  Diagnosis Date   Anxiety    on meds   Bipolar 1 disorder ([place])    Blood transfusion without reported diagnosis 2000   Depression    on meds   GERD (gastroesophageal reflux disease)    on meds   Glaucoma    LEFT eye- not a surgical candidate at this time (02/06/2021)   Heart murmur    Hypertension    on meds   Migraines    Schizophrenia ([place])    Seasonal allergies     Past Surgical History:  Procedure Laterality Date   BILATERAL SALPINGECTOMY Bilateral 08/29/2016   Procedure: BILATERAL SALPINGECTOMY;  Surgeon: Florian Buff, MD;  Location: [facility];  Service: Gynecology;  Laterality: Bilateral;   CARPAL TUNNEL RELEASE Left 04/11/2016   Procedure: CARPAL TUNNEL RELEASE;  Surgeon: [name], MD;  Location: [facility];  Service: Orthopedics;  Laterality: Left;   CARPAL TUNNEL RELEASE Right 03/13/2018   Procedure: RIGHT CARPAL TUNNEL RELEASE;  Surgeon: [name], MD;  Location: [facility];  Service: Orthopedics;  Laterality: Right;    SUPRACERVICAL ABDOMINAL HYSTERECTOMY N/A 08/29/2016   Procedure: HYSTERECTOMY SUPRACERVICAL ABDOMINAL;  Surgeon: Florian Buff, MD;  Location: [facility];  Service: Gynecology;  Laterality: N/A;   TRIGGER FINGER RELEASE Right 11/18/2018   Procedure: RELEASE TRIGGER FINGER/A-1 PULLEY right ring;  Surgeon: [name], MD;  Location: [facility];  Service: Orthopedics;  Laterality: Right;   TUBAL LIGATION     WISDOM TOOTH EXTRACTION     WRIST SURGERY Right 2006   tendonitis    Family History  Problem Relation Age of Onset   Breast cancer Mother    Cancer Mother 80       breast CA at 33 and uterine CA at 29   Hypertension Mother    Kidney disease Mother        kidney transplant   Diabetes Father    Liver disease Father    Cancer Sister 57       uterine   Cancer Sister 47       uterine   Breast cancer Maternal Aunt    Cancer Maternal Aunt        breast   Colon polyps Neg Hx    Colon cancer Neg Hx    Esophageal cancer Neg Hx    Rectal cancer Neg Hx    Stomach cancer Neg Hx     Social History   Socioeconomic History   Marital status: Divorced    Spouse name: Not  on file   Number of children: 3   Years of education: Not on file   Highest education level: 11th grade  Occupational History   Not on file  Tobacco Use   Smoking status: Every Day    Packs/day: 0.25    Years: 7.00    Pack years: 1.75    Types: Cigars, Cigarettes    Last attempt to quit: 11/05/2017    Years since quitting: 3.3   Smokeless tobacco: Never   Tobacco comments:    1 cigar per day  Vaping Use   Vaping Use: Never used  Substance and Sexual Activity   Alcohol use: Not Currently    Alcohol/week: 0.0 - 1.0 standard drinks    Comment: last used 10 days ago   Drug use: Not Currently    Frequency: 4.0 times per week    Types: Marijuana   Sexual activity: Yes    Birth control/protection: Surgical    Comment: supracervical hyst  Other Topics Concern   Not on file  Social History Narrative   Not  on file   Social Determinants of Health   Financial Resource Strain: Not on file  Food Insecurity: Not on file  Transportation Needs: Not on file  Physical Activity: Not on file  Stress: Not on file  Social Connections: Not on file  Intimate Partner Violence: Not on file    Outpatient Medications Prior to Visit  Medication Sig Dispense Refill   amLODipine (NORVASC) 5 MG tablet Take 1 tablet (5 mg total) by mouth daily. 90 tablet 3   aspirin-acetaminophen-caffeine (EXCEDRIN MIGRAINE) 250-250-65 MG tablet Take 2 tablets by mouth every 6 (six) hours as needed for headache or migraine.      cyclobenzaprine (FLEXERIL) 10 MG tablet Take 1 tablet (10 mg total) by mouth 3 (three) times daily as needed for muscle spasms. 30 tablet 1   estradiol (ESTRACE) 2 MG tablet Take 1 tablet (2 mg total) by mouth at bedtime. 90 tablet 4   fluticasone (FLONASE) 50 MCG/ACT nasal spray Place 1 spray into both nostrils 2 (two) times daily as needed for allergies or rhinitis. 16 g 6   Lansoprazole (PREVACID PO) Take 1 tablet by mouth daily at 6 (six) AM.     lisinopril (ZESTRIL) 5 MG tablet Take 1 tablet (5 mg total) by mouth daily. 90 tablet 3   meloxicam (MOBIC) 7.5 MG tablet TAKE 1 TABLET BY MOUTH EVERY DAY 30 tablet 0   cephALEXin (KEFLEX) 500 MG capsule Take 1 capsule (500 mg total) by mouth 2 (two) times daily. 14 capsule 0   No facility-administered medications prior to visit.    Allergies  Allergen Reactions   Imitrex [Sumatriptan] Anaphylaxis and Other (See Comments)    Could not swallow after taking med   Latuda [Lurasidone Hcl] Other (See Comments)    Suicidal thoughts    Wellbutrin [Bupropion] Other (See Comments)    Seizures / lowered seizure threshold   Zoloft [Sertraline Hcl] Hives   Haloperidol And Related Other (See Comments)    Leg cramps   Nicoderm [Nicotine] Dermatitis    Review of Systems  Constitutional: Negative.   HENT: Negative.    Eyes: Negative.   Respiratory:  Negative.    Gastrointestinal:  Negative for abdominal distention, abdominal pain, constipation, diarrhea, nausea and rectal pain.  Genitourinary:  Positive for vaginal discharge. Negative for decreased urine volume, flank pain, pelvic pain and urgency.  Musculoskeletal: Negative.   All other systems reviewed and are negative.  Objective:    Physical Exam Vitals and nursing note reviewed.  Constitutional:      Appearance: Normal appearance.  HENT:     Right Ear: External ear normal.     Left Ear: External ear normal.  Eyes:     Conjunctiva/sclera: Conjunctivae normal.  Cardiovascular:     Rate and Rhythm: Normal rate and regular rhythm.     Pulses: Normal pulses.     Heart sounds: Normal heart sounds.  Pulmonary:     Effort: Pulmonary effort is normal.     Breath sounds: Normal breath sounds.  Abdominal:     General: Bowel sounds are normal.  Genitourinary:    General: Normal vulva.     Vagina: Vaginal discharge present.     Rectum: Normal.  Skin:    General: Skin is warm.     Findings: No rash.  Neurological:     Mental Status: She is alert and oriented to person, place, and time.  Psychiatric:        Behavior: Behavior normal.    BP 124/81    Pulse 74    Ht 5\' 3"  (1.6 m)    Wt 174 lb (78.9 kg)    LMP 08/10/2016 Comment: [place]   SpO2 100%    BMI 30.82 kg/m  Wt Readings from Last 3 Encounters:  03/28/21 174 lb (78.9 kg)  03/08/21 174 lb 8 oz (79.2 kg)  02/20/21 172 lb (78 kg)      Lab Results  Component Value Date   TSH 0.664 10/21/2019   Lab Results  Component Value Date   WBC 6.9 10/21/2019   HGB 13.4 10/21/2019   HCT 38.4 10/21/2019   MCV 96 10/21/2019   PLT 212 10/21/2019   Lab Results  Component Value Date   NA 140 10/21/2019   K 4.2 10/21/2019   CO2 23 10/21/2019   GLUCOSE 90 10/21/2019   BUN 9 10/21/2019   CREATININE 0.73 10/21/2019   BILITOT 0.3 10/21/2019   ALKPHOS 84 10/21/2019   AST 15 10/21/2019   ALT 12 10/21/2019   PROT 6.5  10/21/2019   ALBUMIN 4.2 10/21/2019   CALCIUM 9.0 10/21/2019   ANIONGAP 9 11/14/2018   Lab Results  Component Value Date   CHOL 199 10/21/2019   Lab Results  Component Value Date   HDL 69 10/21/2019   Lab Results  Component Value Date   LDLCALC 113 (H) 10/21/2019   Lab Results  Component Value Date   TRIG 96 10/21/2019   Lab Results  Component Value Date   CHOLHDL 2.9 10/21/2019   Lab Results  Component Value Date   HGBA1C 6.0 04/23/2016       Assessment & Plan:  Patient recently completed a week of antibiotics for an abscess and developed a vaginal yeast infection. Completed assessment. No redness, bleeding, fever or pain. Diflucan 150 mg tablet  once by mouth. Wet prep completed results pending. Education provided to patient with printed hand out given. Follow up with worsening unresolved symptoms.   Problem List Items Addressed This Visit   None Visit Diagnoses     Vaginal discharge    -  Primary   Relevant Medications   fluconazole (DIFLUCAN) 150 MG tablet   Other Relevant Orders   WET PREP FOR TRICH, YEAST, CLUE        Meds ordered this encounter  Medications   fluconazole (DIFLUCAN) 150 MG tablet    Sig: Take 1 tablet (150 mg total) by mouth once  for 1 dose.    Dispense:  1 tablet    Refill:  0    Order Specific Question:   Supervising Provider    Answer:   Claretta Fraise [[id number]]     Ivy Lynn, NP

## 2021-03-29 LAB — CERVICOVAGINAL ANCILLARY ONLY
Bacterial Vaginitis (gardnerella): NEGATIVE
Candida Glabrata: NEGATIVE
Candida Vaginitis: POSITIVE — AB
Comment: NEGATIVE
Comment: NEGATIVE
Comment: NEGATIVE
Comment: NEGATIVE
Trichomonas: NEGATIVE

## 2021-04-07 ENCOUNTER — Ambulatory Visit: Payer: Medicaid Other | Admitting: Nurse Practitioner

## 2021-04-07 ENCOUNTER — Encounter: Payer: Self-pay | Admitting: Nurse Practitioner

## 2021-04-07 VITALS — BP 126/85 | HR 58 | Temp 97.4°F | Resp 20 | Ht 63.0 in | Wt 174.0 lb

## 2021-04-07 DIAGNOSIS — L02219 Cutaneous abscess of trunk, unspecified: Secondary | ICD-10-CM | POA: Diagnosis not present

## 2021-04-07 MED ORDER — FLUCONAZOLE 150 MG PO TABS: 150.0000 mg | ORAL_TABLET | Freq: Once | ORAL | 0 refills | Status: AC

## 2021-04-07 MED ORDER — SULFAMETHOXAZOLE-TRIMETHOPRIM 800-160 MG PO TABS: 1.0000 | ORAL_TABLET | Freq: Two times a day (BID) | ORAL | 0 refills | Status: DC

## 2021-04-07 NOTE — Progress Notes (Signed)
Subjective:    Patient ID: Stacy Wolf, female    DOB: 08/24/1975, 46 y.o.   MRN: [id number]   Chief Complaint: Cyst in vaginal area   Patient comes in today with a cyst in perineal area. She came in and saw Onyeje, NP. And was given keflex for infection and lortab for pain. Was mot getting batter so  she returned on 03/28/21 and treated her for yeast infection. She did not do anything about cyst. Today cyst is bigger and sore to touch. No drainage.      Review of Systems  Constitutional:  Negative for diaphoresis.  Eyes:  Negative for pain.  Respiratory:  Negative for shortness of breath.   Cardiovascular:  Negative for chest pain, palpitations and leg swelling.  Gastrointestinal:  Negative for abdominal pain.  Endocrine: Negative for polydipsia.  Skin:  Negative for rash.  Neurological:  Negative for dizziness, weakness and headaches.  Hematological:  Does not bruise/bleed easily.  All other systems reviewed and are negative.     Objective:   Physical Exam Vitals and nursing note reviewed.  Constitutional:      General: She is not in acute distress.    Appearance: Normal appearance. She is well-developed.  HENT:     Head: Normocephalic.     Right Ear: Tympanic membrane normal.     Left Ear: Tympanic membrane normal.     Nose: Nose normal.     Mouth/Throat:     Mouth: Mucous membranes are moist.  Eyes:     Pupils: Pupils are equal, round, and reactive to light.  Neck:     Vascular: No carotid bruit or JVD.  Cardiovascular:     Rate and Rhythm: Normal rate and regular rhythm.     Heart sounds: Normal heart sounds.  Pulmonary:     Effort: Pulmonary effort is normal. No respiratory distress.     Breath sounds: Normal breath sounds. No wheezing or rales.  Chest:     Chest wall: No tenderness.  Abdominal:     General: Bowel sounds are normal. There is no distension or abdominal bruit.     Palpations: Abdomen is soft. There is no hepatomegaly, splenomegaly, mass  or pulsatile mass.     Tenderness: There is no abdominal tenderness.  Genitourinary:    Comments: Raised indurated lesion left mons pubis Musculoskeletal:        General: Normal range of motion.     Cervical back: Normal range of motion and neck supple.  Lymphadenopathy:     Cervical: No cervical adenopathy.  Skin:    General: Skin is warm and dry.  Neurological:     Mental Status: She is alert and oriented to person, place, and time.     Deep Tendon Reflexes: Reflexes are normal and symmetric.  Psychiatric:        Behavior: Behavior normal.        Thought Content: Thought content normal.        Judgment: Judgment normal.   BP 126/85    Pulse (!) 58    Temp (!) 97.4 F (36.3 C) (Temporal)    Resp 20    Ht 5\' 3"  (1.6 m)    Wt 174 lb (78.9 kg)    LMP 08/10/2016 Comment: [place]   SpO2 95%    BMI 30.82 kg/m    I & D  Date/Time: 04/07/2021 9:49 AM Performed by: Chevis Pretty, FNP Authorized by: Chevis Pretty, FNP   Consent:    Consent  obtained:  Verbal   Consent given by:  Patient   Risks, benefits, and alternatives were discussed: yes     Risks discussed:  Incomplete drainage and pain   Alternatives discussed:  Delayed treatment Location:    Type:  Abscess   Location: pubic area. Pre-procedure details:    Skin preparation:  Povidone-iodine Sedation:    Sedation type:  None Anesthesia:    Anesthesia method:  Local infiltration   Local anesthetic:  Lidocaine 2% WITH epi Procedure type:    Complexity:  Simple Procedure details:    Needle aspiration: no     Incision types:  Stab incision   Incision depth:  Submucosal   Scalpel blade:  11   Wound management:  Probed and deloculated   Drainage:  Purulent   Drainage amount:  Scant   Wound treatment:  Wound left open Post-procedure details:    Procedure completion:  Tolerated well, no immediate complications      Assessment & Plan:   Stacy Wolf in today with chief complaint of Cyst in vaginal  area   1. Abscess of pubic region Warm compresses Clean daily RTO if no improvement Meds ordered this encounter  Medications   sulfamethoxazole-trimethoprim (BACTRIM DS) 800-160 MG tablet    Sig: Take 1 tablet by mouth 2 (two) times daily.    Dispense:  20 tablet    Refill:  0    Order Specific Question:   Supervising Provider    Answer:   Caryl Pina A [[id number]]       The above assessment and management plan was discussed with the patient. The patient verbalized understanding of and has agreed to the management plan. Patient is aware to call the clinic if symptoms persist or worsen. Patient is aware when to return to the clinic for a follow-up visit. Patient educated on when it is appropriate to go to the emergency department.   Mary-Margaret Hassell Done, FNP

## 2021-04-07 NOTE — Patient Instructions (Signed)
Skin Abscess  A skin abscess is an infected area on or under your skin that contains a collection of pus and other material. An abscess may also be called a furuncle,carbuncle, or boil. An abscess can occur in or on almost any part of your body. Some abscesses break open (rupture) on their own. Most continue to get worse unless they are treated. The infection can spread deeper into the body and eventually into your blood, whichcan make you feel ill. Treatment usually involves draining the abscess. What are the causes? An abscess occurs when germs, like bacteria, pass through your skin and cause an infection. This may be caused by: A scrape or cut on your skin. A puncture wound through your skin, including a needle injection or insect bite. Blocked oil or sweat glands. Blocked and infected hair follicles. A cyst that forms beneath your skin (sebaceous cyst) and becomes infected. What increases the risk? This condition is more likely to develop in people who: Have a weak body defense system (immune system). Have diabetes. Have dry and irritated skin. Get frequent injections or use illegal IV drugs. Have a foreign body in a wound, such as a splinter. Have problems with their lymph system or veins. What are the signs or symptoms? Symptoms of this condition include: A painful, firm bump under the skin. A bump with pus at the top. This may break through the skin and drain. Other symptoms include: Redness surrounding the abscess site. Warmth. Swelling of the lymph nodes (glands) near the abscess. Tenderness. A sore on the skin. How is this diagnosed? This condition may be diagnosed based on: A physical exam. Your medical history. A sample of pus. This may be used to find out what is causing the infection. Blood tests. Imaging tests, such as an ultrasound, CT scan, or MRI. How is this treated? A small abscess that drains on its own may not need treatment. Treatment for larger abscesses  may include: Moist heat or heat pack applied to the area several times a day. A procedure to drain the abscess (incision and drainage). Antibiotic medicines. For a severe abscess, you may first get antibiotics through an IV and then change to antibiotics by mouth. Follow these instructions at home: Medicines  Take over-the-counter and prescription medicines only as told by your health care provider. If you were prescribed an antibiotic medicine, take it as told by your health care provider. Do not stop taking the antibiotic even if you start to feel better.  Abscess care  If you have an abscess that has not drained, apply heat to the affected area. Use the heat source that your health care provider recommends, such as a moist heat pack or a heating pad. Place a towel between your skin and the heat source. Leave the heat on for 20-30 minutes. Remove the heat if your skin turns bright red. This is especially important if you are unable to feel pain, heat, or cold. You may have a greater risk of getting burned. Follow instructions from your health care provider about how to take care of your abscess. Make sure you: Cover the abscess with a bandage (dressing). Change your dressing or gauze as told by your health care provider. Wash your hands with soap and water before you change the dressing or gauze. If soap and water are not available, use hand sanitizer. Check your abscess every day for signs of a worsening infection. Check for: More redness, swelling, or pain. More fluid or blood. Warmth. More   pus or a bad smell.  General instructions To avoid spreading the infection: Do not share personal care items, towels, or hot tubs with others. Avoid making skin contact with other people. Keep all follow-up visits as told by your health care provider. This is important. Contact a health care provider if you have: More redness, swelling, or pain around your abscess. More fluid or blood coming  from your abscess. Warm skin around your abscess. More pus or a bad smell coming from your abscess. A fever. Muscle aches. Chills or a general ill feeling. Get help right away if you: Have severe pain. See red streaks on your skin spreading away from the abscess. Summary A skin abscess is an infected area on or under your skin that contains a collection of pus and other material. A small abscess that drains on its own may not need treatment. Treatment for larger abscesses may include having a procedure to drain the abscess and taking an antibiotic. This information is not intended to replace advice given to you by your health care provider. Make sure you discuss any questions you have with your healthcare provider. Document Revised: 06/12/2018 Document Reviewed: 04/04/2017 Elsevier Patient Education  2022 [employer] Inc.  

## 2021-04-07 NOTE — Addendum Note (Signed)
Addended by: Chevis Pretty on: 04/07/2021 09:54 AM   Modules accepted: Orders

## 2021-04-18 ENCOUNTER — Other Ambulatory Visit: Payer: Self-pay | Admitting: Nurse Practitioner

## 2021-04-18 DIAGNOSIS — M25561 Pain in right knee: Secondary | ICD-10-CM

## 2021-04-27 ENCOUNTER — Telehealth: Payer: Self-pay | Admitting: *Deleted

## 2021-04-27 NOTE — Telephone Encounter (Signed)
Pt calling in this morning c/o headache since Tuesday afternoon and she now has BP readings are 146/112 this morning. She does have a history of migraines but hasn't had one in a long time since she started HTN meds and she hasn't missed any of her medications. She denies chest pain, SOB, weakness, numbness/tingling, heart racing and blurred vision. Pt is c/o sensitivity to light. I scheduled pt with B Joyce at 8:20 in the morning as we don't have any openings today. She will go to UC today due to her BP being so elevated and usually her BP is much lower.

## 2021-04-28 ENCOUNTER — Ambulatory Visit: Payer: Medicaid Other | Admitting: Family Medicine

## 2021-04-28 ENCOUNTER — Encounter: Payer: Self-pay | Admitting: Family Medicine

## 2021-04-28 VITALS — BP 138/82 | HR 74 | Temp 97.9°F | Ht 63.0 in | Wt 175.8 lb

## 2021-04-28 DIAGNOSIS — F330 Major depressive disorder, recurrent, mild: Secondary | ICD-10-CM

## 2021-04-28 DIAGNOSIS — R110 Nausea: Secondary | ICD-10-CM | POA: Diagnosis not present

## 2021-04-28 DIAGNOSIS — G44201 Tension-type headache, unspecified, intractable: Secondary | ICD-10-CM | POA: Diagnosis not present

## 2021-04-28 DIAGNOSIS — F419 Anxiety disorder, unspecified: Secondary | ICD-10-CM

## 2021-04-28 MED ORDER — ONDANSETRON 4 MG PO TBDP: 4.0000 mg | ORAL_TABLET | Freq: Three times a day (TID) | ORAL | 0 refills | Status: DC | PRN

## 2021-04-28 NOTE — Progress Notes (Signed)
Assessment & Plan:  1. Acute intractable tension-type headache Unresolved. Sample given of Nurtec 75 mg PO QOD PRN. Encouraged to continue prednisone taper and try taking tizanidine. Note provided to be out of work this weekend. Education provided on tension headaches.  2. Nausea without vomiting - ondansetron (ZOFRAN-ODT) 4 MG disintegrating tablet; Take 1 tablet (4 mg total) by mouth every 8 (eight) hours as needed for nausea or vomiting.  Dispense: 20 tablet; Refill: 0  3-4. Anxiety/Recurrent major depressive episodes, mild (HCC) Uncontrolled. GeneSight testing completed today.   Follow up plan: Return if symptoms worsen or fail to improve.  Hendricks Limes, MSN, APRN, FNP-C [facility]  Subjective:   Patient ID: Stacy Wolf, female    DOB: 03/02/1976, 46 y.o.   MRN: [id number]  HPI: Stacy Wolf is a 46 y.o. female presenting on 04/28/2021 for Follow-up (Urgent care follow up- 2/24 [facility]- migraine & HTN)  Patient was seen at [facility] urgent care yesterday due to elevated blood pressure and headache that started 2 days prior.  She was given Toradol 60 mg and dexamethasone 10 mg in the office.  Blood pressure was 138/80.  Patient was reassured that blood pressure was controlled and that her headache was from a tension headache and not her blood pressure.  She was prescribed a prednisone taper and tizanidine.  Patient reports today she got no relief from interventions yesterday. She has not yet tried the tizanidine. Patient describes her headache is in her temples. It wraps around her head and is also in her neck. She is very nauseated. Her headache started three days ago around 2 PM at which time she was also very dizzy. Dizziness continues. She is taking Excedrin every four hours like tic-tacs.   Patient reports she does have a lot of anxiety, mostly due to a coworker that she has to work with every night. She has been doing a lot of meditation and has apps on her  phone to help calm her down. She cannot use her phone at work to help during that time. Previously she has taken Xanax, Clonazepam, Wellbutrin, Zoloft, Latuda, Haldol, and Seroquel. She reports she was allergic to Xanax - she ended up in the "crazy house".   GAD 7 : Generalized Anxiety Score 04/28/2021 03/28/2021 03/08/2021 01/13/2021  Nervous, Anxious, on Edge 3 3 0 3  Control/stop worrying 3 3 0 3  Worry too much - different things 3 3 0 3  Trouble relaxing 3 2 0 2  Restless 3 1 0 0  Easily annoyed or irritable 3 3 0 0  Afraid - awful might happen 3 0 0 3  Total GAD 7 Score 21 15 0 14  Anxiety Difficulty Extremely difficult - Not difficult at all Somewhat difficult   Depression screen [facility] 2/9 04/28/2021 03/28/2021 03/08/2021  Decreased Interest 1 2 0  Down, Depressed, Hopeless 0 2 0  PHQ - 2 Score 1 4 0  Altered sleeping 3 3 0  Tired, decreased energy 1 3 0  Change in appetite 1 0 0  Feeling bad or failure about yourself  0 3 0  Trouble concentrating 0 0 0  Moving slowly or fidgety/restless 1 0 0  Suicidal thoughts 0 1 0  PHQ-9 Score 7 14 0  Difficult doing work/chores Somewhat difficult - Not difficult at all  Some recent data might be hidden    ROS: Negative unless specifically indicated above in HPI.   Relevant past medical history reviewed and  updated as indicated.   Allergies and medications reviewed and updated.   Current Outpatient Medications:    amLODipine (NORVASC) 5 MG tablet, Take 1 tablet (5 mg total) by mouth daily., Disp: 90 tablet, Rfl: 3   aspirin-acetaminophen-caffeine (EXCEDRIN MIGRAINE) 250-250-65 MG tablet, Take 2 tablets by mouth every 6 (six) hours as needed for headache or migraine. , Disp: , Rfl:    cyclobenzaprine (FLEXERIL) 10 MG tablet, Take 1 tablet (10 mg total) by mouth 3 (three) times daily as needed for muscle spasms., Disp: 30 tablet, Rfl: 1   estradiol (ESTRACE) 2 MG tablet, Take 1 tablet (2 mg total) by mouth at bedtime., Disp: 90 tablet, Rfl: 4    fluticasone (FLONASE) 50 MCG/ACT nasal spray, Place 1 spray into both nostrils 2 (two) times daily as needed for allergies or rhinitis., Disp: 16 g, Rfl: 6   Lansoprazole (PREVACID PO), Take 1 tablet by mouth daily at 6 (six) AM., Disp: , Rfl:    lisinopril (ZESTRIL) 5 MG tablet, Take 1 tablet (5 mg total) by mouth daily., Disp: 90 tablet, Rfl: 3   meloxicam (MOBIC) 7.5 MG tablet, Take 1 tablet (7.5 mg total) by mouth daily. (NEEDS TO BE SEEN BEFORE NEXT REFILL), Disp: 30 tablet, Rfl: 0   predniSONE (DELTASONE) 5 MG tablet, Take 6-5-4-3-2-1 po qd, Disp: , Rfl:    tiZANidine (ZANAFLEX) 4 MG tablet, Take by mouth., Disp: , Rfl:   Allergies  Allergen Reactions   Imitrex [Sumatriptan] Anaphylaxis and Other (See Comments)    Could not swallow after taking med   Latuda [Lurasidone Hcl] Other (See Comments)    Suicidal thoughts    Wellbutrin [Bupropion] Other (See Comments)    Seizures / lowered seizure threshold   Zoloft [Sertraline Hcl] Hives   Haloperidol And Related Other (See Comments)    Leg cramps   Nicoderm [Nicotine] Dermatitis    Objective:   BP 138/82    Pulse 74    Temp 97.9 F (36.6 C) (Temporal)    Ht 5\' 3"  (1.6 m)    Wt 175 lb 12.8 oz (79.7 kg)    LMP 08/10/2016 Comment: [place]   SpO2 98%    BMI 31.14 kg/m    Physical Exam Vitals reviewed.  Constitutional:      General: She is not in acute distress.    Appearance: Normal appearance. She is not ill-appearing, toxic-appearing or diaphoretic.  HENT:     Head: Normocephalic and atraumatic.  Eyes:     General: No scleral icterus.       Right eye: No discharge.        Left eye: No discharge.     Conjunctiva/sclera: Conjunctivae normal.  Cardiovascular:     Rate and Rhythm: Normal rate.  Pulmonary:     Effort: Pulmonary effort is normal. No respiratory distress.  Musculoskeletal:        General: Normal range of motion.     Cervical back: Normal range of motion.  Skin:    General: Skin is warm and dry.     Capillary  Refill: Capillary refill takes less than 2 seconds.  Neurological:     General: No focal deficit present.     Mental Status: She is alert and oriented to person, place, and time. Mental status is at baseline.  Psychiatric:        Mood and Affect: Mood normal.        Behavior: Behavior normal.        Thought Content: Thought content  normal.        Judgment: Judgment normal.

## 2021-05-05 ENCOUNTER — Telehealth: Payer: Self-pay | Admitting: Family Medicine

## 2021-05-05 NOTE — Telephone Encounter (Signed)
Please let patient know I have received her Gene Sight test results. I would like to start her on amitriptyline to help with both her headaches and depression. If she is agreeable, please send in amitriptyline 25 mg PO QHS #30 with 2 refills. She will need a follow-up appointment with either myself or PCP in 6 weeks.  ?

## 2021-05-08 ENCOUNTER — Other Ambulatory Visit: Payer: Self-pay

## 2021-05-08 MED ORDER — AMITRIPTYLINE HCL 25 MG PO TABS: 25.0000 mg | ORAL_TABLET | Freq: Every day | ORAL | 2 refills | Status: DC

## 2021-05-08 NOTE — Telephone Encounter (Signed)
Pt has been informed. She is ok starting Amitriptyline. Rx sent to [facility] in [place]. Appt scheduled with Dettinger on 4/17 ?

## 2021-05-12 ENCOUNTER — Encounter: Payer: Self-pay | Admitting: Family Medicine

## 2021-05-12 ENCOUNTER — Other Ambulatory Visit (HOSPITAL_COMMUNITY)
Admission: RE | Admit: 2021-05-12 | Discharge: 2021-05-12 | Disposition: A | Discharge status: Home / Self Care | Payer: Medicaid Other | Source: Ambulatory Visit | Attending: Family Medicine | Admitting: Family Medicine

## 2021-05-12 ENCOUNTER — Ambulatory Visit: Payer: Medicaid Other | Admitting: Family Medicine

## 2021-05-12 VITALS — BP 103/63 | HR 85 | Temp 97.5°F | Ht 63.0 in | Wt 171.0 lb

## 2021-05-12 DIAGNOSIS — N898 Other specified noninflammatory disorders of vagina: Secondary | ICD-10-CM | POA: Diagnosis not present

## 2021-05-12 LAB — WET PREP FOR TRICH, YEAST, CLUE
Clue Cell Exam: NEGATIVE
Trichomonas Exam: NEGATIVE
Yeast Exam: NEGATIVE

## 2021-05-12 MED ORDER — FLUCONAZOLE 150 MG PO TABS
150.0000 mg | ORAL_TABLET | Freq: Once | ORAL | 0 refills | Status: AC
Start: 2021-05-12 — End: 2021-05-12

## 2021-05-12 NOTE — Progress Notes (Signed)
? ?Subjective: ?CC: Abdominal pain ?PCP: Dettinger, Fransisca Kaufmann, MD ?GYB:WLSLHT Stacy Wolf is a 46 y.o. female presenting to clinic today for: ? ?1.  Abdominal pain and cramping ?Patient reports bilateral lower abdominal pain and cramping with left being worse than the right.  She reports a watery discharge over the last 4 days.  Denies any vaginal odors, vaginal itching or irritation.  No nausea, vomiting, fevers, diarrhea, constipation, dysuria, postcoital bleeding or dyspareunia.  Menses are absent as she has had a supracervical abdominal hysterectomy.  She is sexually active with one partner.  Uncertain fidelity.  Last unprotected sex was 2 days ago ? ? ?ROS: Per HPI ? ?Allergies  ?Allergen Reactions  ? Imitrex [Sumatriptan] Anaphylaxis and Other (See Comments)  ?  Could not swallow after taking med  ? Stacy Wolf [Lurasidone Hcl] Other (See Comments)  ?  Suicidal thoughts ?  ? Wellbutrin [Bupropion] Other (See Comments)  ?  Seizures / lowered seizure threshold  ? Zoloft [Sertraline Hcl] Hives  ? Alprazolam Other (See Comments)  ?  Passed out  ? Haloperidol And Related Other (See Comments)  ?  Leg cramps  ? Nicoderm [Nicotine] Dermatitis  ? ?Past Medical History:  ?Diagnosis Date  ? Anxiety   ? on meds  ? Bipolar 1 disorder ([place])   ? Blood transfusion without reported diagnosis 2000  ? Depression   ? on meds  ? GERD (gastroesophageal reflux disease)   ? on meds  ? Glaucoma   ? LEFT eye- not a surgical candidate at this time (02/06/2021)  ? Heart murmur   ? Hypertension   ? on meds  ? Migraines   ? Schizophrenia ([place])   ? Seasonal allergies   ? ? ?Current Outpatient Medications:  ?  amitriptyline (ELAVIL) 25 MG tablet, Take 1 tablet (25 mg total) by mouth at bedtime., Disp: 30 tablet, Rfl: 2 ?  amLODipine (NORVASC) 5 MG tablet, Take 1 tablet (5 mg total) by mouth daily., Disp: 90 tablet, Rfl: 3 ?  aspirin-acetaminophen-caffeine (EXCEDRIN MIGRAINE) 250-250-65 MG tablet, Take 2 tablets by mouth every 6 (six) hours as needed  for headache or migraine. , Disp: , Rfl:  ?  cyclobenzaprine (FLEXERIL) 10 MG tablet, Take 1 tablet (10 mg total) by mouth 3 (three) times daily as needed for muscle spasms., Disp: 30 tablet, Rfl: 1 ?  estradiol (ESTRACE) 2 MG tablet, Take 1 tablet (2 mg total) by mouth at bedtime., Disp: 90 tablet, Rfl: 4 ?  fluticasone (FLONASE) 50 MCG/ACT nasal spray, Place 1 spray into both nostrils 2 (two) times daily as needed for allergies or rhinitis., Disp: 16 g, Rfl: 6 ?  Lansoprazole (PREVACID PO), Take 1 tablet by mouth daily at 6 (six) AM., Disp: , Rfl:  ?  lisinopril (ZESTRIL) 5 MG tablet, Take 1 tablet (5 mg total) by mouth daily., Disp: 90 tablet, Rfl: 3 ?  meloxicam (MOBIC) 7.5 MG tablet, Take 1 tablet (7.5 mg total) by mouth daily. (NEEDS TO BE SEEN BEFORE NEXT REFILL), Disp: 30 tablet, Rfl: 0 ?  tiZANidine (ZANAFLEX) 4 MG tablet, Take by mouth., Disp: , Rfl:  ?Social History  ? ?Socioeconomic History  ? Marital status: Divorced  ?  Spouse name: Not on file  ? Number of children: 3  ? Years of education: Not on file  ? Highest education level: 11th grade  ?Occupational History  ? Not on file  ?Tobacco Use  ? Smoking status: Every Day  ?  Packs/day: 0.25  ?  Years:  7.00  ?  Pack years: 1.75  ?  Types: Cigars, Cigarettes  ?  Last attempt to quit: 11/05/2017  ?  Years since quitting: 3.5  ? Smokeless tobacco: Never  ? Tobacco comments:  ?  1 cigar per day  ?Vaping Use  ? Vaping Use: Never used  ?Substance and Sexual Activity  ? Alcohol use: Not Currently  ?  Alcohol/week: 0.0 - 1.0 standard drinks  ?  Comment: last used 10 days ago  ? Drug use: Not Currently  ?  Frequency: 4.0 times per week  ?  Types: Marijuana  ? Sexual activity: Yes  ?  Birth control/protection: Surgical  ?  Comment: supracervical hyst  ?Other Topics Concern  ? Not on file  ?Social History Narrative  ? Not on file  ? ?Social Determinants of Health  ? ?Financial Resource Strain: Not on file  ?Food Insecurity: Not on file  ?Transportation Needs: Not on  file  ?Physical Activity: Not on file  ?Stress: Not on file  ?Social Connections: Not on file  ?Intimate Partner Violence: Not on file  ? ?Family History  ?Problem Relation Age of Onset  ? Breast cancer Mother   ? Cancer Mother 54  ?     breast CA at 35 and uterine CA at 27  ? Hypertension Mother   ? Kidney disease Mother   ?     kidney transplant  ? Diabetes Father   ? Liver disease Father   ? Cancer Sister 37  ?     uterine  ? Cancer Sister 38  ?     uterine  ? Breast cancer Maternal Aunt   ? Cancer Maternal Aunt   ?     breast  ? Colon polyps Neg Hx   ? Colon cancer Neg Hx   ? Esophageal cancer Neg Hx   ? Rectal cancer Neg Hx   ? Stomach cancer Neg Hx   ? ? ?Objective: ?Office vital signs reviewed. ?BP 103/63   Pulse 85   Temp (!) 97.5 ?F (36.4 ?C) (Temporal)   Ht '5\' 3"'$  (1.6 m)   Wt 171 lb (77.6 kg)   LMP 08/10/2016 Comment: [place]  BMI 30.29 kg/m?  ? ?Physical Examination:  ?General: Awake, alert, well nourished, No acute distress ?GU: external vaginal tissue normal, cervix midline, no punctate lesions on cervix appreciated, moderate white cheesy discharge from cervical os, np bleeding, no cervical motion tenderness, no abdominal masses but bilateral TTP to lower quadrants present. ? ?Assessment/ Plan: ?46 y.o. female  ? ?Vaginal discharge - Plan: WET PREP FOR [place], YEAST, CLUE, GC/Chlamydia probe amp ([place])not at [facility], HIV antibody (with reflex), RPR, Hepatitis C antibody, HSV(herpes simplex vrs) 1+2 ab-IgG, fluconazole (DIFLUCAN) 150 MG tablet ? ?Vaginal discharge clinically consistent with ongoing yeast vaginitis.  I have gone ahead and sent her over Diflucan to treat.  She may repeat in 3 days if symptoms persist.  In the interim she did wish to proceed with total evaluation for STIs.  I think this is reasonable given tenderness to palpation on exam to bilateral lower quadrants.  Has history of supracervical hysterectomy.  Orders have been placed we will contact her with the results once  available ? ?No orders of the defined types were placed in this encounter. ? ?No orders of the defined types were placed in this encounter. ? ? ? ?Janora Norlander, DO ?[place] ?[phone] ? ? ?

## 2021-05-12 NOTE — Patient Instructions (Signed)

## 2021-05-13 LAB — HIV ANTIBODY (ROUTINE TESTING W REFLEX): HIV Screen 4th Generation wRfx: NONREACTIVE

## 2021-05-13 LAB — HEPATITIS C ANTIBODY: Hep C Virus Ab: NONREACTIVE

## 2021-05-13 LAB — HSV(HERPES SIMPLEX VRS) I + II AB-IGG
HSV 1 Glycoprotein G Ab, IgG: 46.8 index — ABNORMAL HIGH (ref 0.00–0.90)
HSV 2 IgG, Type Spec: 13.4 index — ABNORMAL HIGH (ref 0.00–0.90)

## 2021-05-13 LAB — RPR: RPR Ser Ql: NONREACTIVE

## 2021-05-16 LAB — GC/CHLAMYDIA PROBE AMP (~~LOC~~) NOT AT ARMC
Chlamydia: NEGATIVE
Comment: NEGATIVE
Comment: NORMAL
Neisseria Gonorrhea: NEGATIVE

## 2021-05-30 ENCOUNTER — Other Ambulatory Visit: Payer: Self-pay | Admitting: Family Medicine

## 2021-06-19 ENCOUNTER — Ambulatory Visit: Payer: Medicaid Other | Admitting: Family Medicine

## 2021-06-20 ENCOUNTER — Encounter: Payer: Self-pay | Admitting: Family Medicine

## 2021-07-14 ENCOUNTER — Telehealth: Payer: Self-pay | Admitting: Family Medicine

## 2021-07-14 DIAGNOSIS — M13161 Monoarthritis, not elsewhere classified, right knee: Secondary | ICD-10-CM

## 2021-07-14 NOTE — Telephone Encounter (Signed)
Placed referral for the patient. 

## 2021-07-14 NOTE — Telephone Encounter (Signed)
REFERRAL REQUEST ?Telephone Note ? ?Have you been seen at our office for this problem? [facility] [phone] ?Yes in 2021 ?(Advise that they may need an appointment with their PCP before a referral can be done) ? ?Reason for Referral: knee pain and swollen. Pt has an apt on 07/27/2021 at 11:20. ?Referral discussed with patient: yes. Pt says that Dr Warrick Parisian is aware of this issue. ?Best contact number of patient for referral team: [phone]    ?Has patient been seen by a specialist for this issue before: yes 2064fr trigger finger and carpel tunnel--[facility] [phone]?Patient provider preference for referral: [phone]?Patient location preference for referral: [phone]?  ?Patient notified that referrals can take up to a week or longer to process. If they haven't heard anything within a week they should call back and speak with the referral department.  ? ?

## 2021-07-27 ENCOUNTER — Encounter: Payer: Self-pay | Admitting: Orthopedic Surgery

## 2021-07-27 ENCOUNTER — Ambulatory Visit: Payer: Medicaid Other | Admitting: Orthopedic Surgery

## 2021-07-27 VITALS — BP 134/96 | HR 65 | Ht 63.0 in | Wt 171.2 lb

## 2021-07-27 DIAGNOSIS — M23321 Other meniscus derangements, posterior horn of medial meniscus, right knee: Secondary | ICD-10-CM

## 2021-07-27 NOTE — Progress Notes (Signed)
EVALUATION AND MANAGEMENT   Type of appointment : new problem   PLAN: MRI right knee   No orders of the defined types were placed in this encounter.    Chief Complaint  Patient presents with   Knee Pain    RT knee/ pt slipped and fell on ice porch ~March    [name] is 46 years old.  She slipped on a porch falling on the right knee several months ago.  She was seen by her primary care she had an anti-inflammatory prescribed at physical therapy and a cortisone injection.  She comes in today complaining of continued right knee pain and swelling and giving way with hyperextension as well.   Review of Systems  All other systems reviewed and are negative.   Body mass index is 30.33 kg/m.  Physical Exam  Focused exam of the right knee she is ambulatory without assistive devices.  She has a small effusion.  She has medial tenderness and a positive McMurray's sign.  She has full flexion of the knee with stable anterior and posterior cruciate ligaments no pain with hyperextension of the knee.  Neurovascular exam is intact skin is normal  Past Medical History:  Diagnosis Date   Anxiety    on meds   Bipolar 1 disorder ([place])    Blood transfusion without reported diagnosis 2000   Depression    on meds   GERD (gastroesophageal reflux disease)    on meds   Glaucoma    LEFT eye- not a surgical candidate at this time (02/06/2021)   Heart murmur    Hypertension    on meds   Migraines    Schizophrenia ([place])    Seasonal allergies    Past Surgical History:  Procedure Laterality Date   BILATERAL SALPINGECTOMY Bilateral 08/29/2016   Procedure: BILATERAL SALPINGECTOMY;  Surgeon: Florian Buff, MD;  Location: [facility];  Service: Gynecology;  Laterality: Bilateral;   CARPAL TUNNEL RELEASE Left 04/11/2016   Procedure: CARPAL TUNNEL RELEASE;  Surgeon: [name], MD;  Location: [facility];  Service: Orthopedics;  Laterality: Left;   CARPAL TUNNEL RELEASE Right 03/13/2018    Procedure: RIGHT CARPAL TUNNEL RELEASE;  Surgeon: [name], MD;  Location: [facility];  Service: Orthopedics;  Laterality: Right;   SUPRACERVICAL ABDOMINAL HYSTERECTOMY N/A 08/29/2016   Procedure: HYSTERECTOMY SUPRACERVICAL ABDOMINAL;  Surgeon: Florian Buff, MD;  Location: [facility];  Service: Gynecology;  Laterality: N/A;   TRIGGER FINGER RELEASE Right 11/18/2018   Procedure: RELEASE TRIGGER FINGER/A-1 PULLEY right ring;  Surgeon: [name], MD;  Location: [facility];  Service: Orthopedics;  Laterality: Right;   TUBAL LIGATION     WISDOM TOOTH EXTRACTION     WRIST SURGERY Right 2006   tendonitis   Social History   Tobacco Use   Smoking status: Every Day    Packs/day: 0.25    Years: 7.00    Pack years: 1.75    Types: Cigars, Cigarettes    Last attempt to quit: 11/05/2017    Years since quitting: 3.7   Smokeless tobacco: Never   Tobacco comments:    1 cigar per day  Vaping Use   Vaping Use: Never used  Substance Use Topics   Alcohol use: Not Currently    Alcohol/week: 0.0 - 1.0 standard drinks    Comment: last used 10 days ago   Drug use: Not Currently    Frequency: 4.0 times per week    Types: Marijuana  Assessment and Plan:  Imaging: I have personally reviewed the images and my personal interpretation of the images:  normal no OA  Encounter Diagnosis  Name Primary?   Other meniscus derangements, posterior horn of medial meniscus, right knee Yes    Return after MRI

## 2021-07-27 NOTE — Addendum Note (Signed)
Addended byCandice Camp on: 07/27/2021 11:38 AM   Modules accepted: Orders

## 2021-07-27 NOTE — Patient Instructions (Signed)
While we are working on your approval for MRI please go ahead and call to schedule your appointment with [facility] within at least one (1) week.   Central Scheduling [phone]  

## 2021-08-02 ENCOUNTER — Ambulatory Visit: Payer: Medicaid Other | Admitting: Family Medicine

## 2021-08-02 ENCOUNTER — Other Ambulatory Visit (HOSPITAL_COMMUNITY)
Admission: RE | Admit: 2021-08-02 | Discharge: 2021-08-02 | Disposition: A | Discharge status: Home / Self Care | Payer: Medicaid Other | Source: Ambulatory Visit | Attending: Family Medicine | Admitting: Family Medicine

## 2021-08-02 ENCOUNTER — Encounter: Payer: Self-pay | Admitting: Family Medicine

## 2021-08-02 VITALS — BP 124/77 | HR 63 | Temp 98.0°F | Ht 63.0 in | Wt 174.0 lb

## 2021-08-02 DIAGNOSIS — F4310 Post-traumatic stress disorder, unspecified: Secondary | ICD-10-CM

## 2021-08-02 DIAGNOSIS — E785 Hyperlipidemia, unspecified: Secondary | ICD-10-CM

## 2021-08-02 DIAGNOSIS — G43009 Migraine without aura, not intractable, without status migrainosus: Secondary | ICD-10-CM | POA: Diagnosis not present

## 2021-08-02 DIAGNOSIS — N898 Other specified noninflammatory disorders of vagina: Secondary | ICD-10-CM | POA: Diagnosis present

## 2021-08-02 DIAGNOSIS — I10 Essential (primary) hypertension: Secondary | ICD-10-CM | POA: Diagnosis not present

## 2021-08-02 MED ORDER — AMITRIPTYLINE HCL 25 MG PO TABS: 25.0000 mg | ORAL_TABLET | Freq: Every day | ORAL | 1 refills | Status: DC

## 2021-08-02 MED ORDER — FLUCONAZOLE 150 MG PO TABS: 150.0000 mg | ORAL_TABLET | Freq: Once | ORAL | 0 refills | Status: AC

## 2021-08-02 NOTE — Progress Notes (Signed)
BP 124/77   Pulse 63   Temp 98 F (36.7 C)   Ht '5\' 3"'$  (1.6 m)   Wt 174 lb (78.9 kg)   LMP 08/10/2016 Comment: [place]  SpO2 100%   BMI 30.82 kg/m    Subjective:   Patient ID: Stacy Wolf, female    DOB: 1975/07/21, 46 y.o.   MRN: [id number]  HPI: Stacy Wolf is a 46 y.o. female presenting on 08/02/2021 for Medical Management of Chronic Issues, Vaginal Itching, Knee Pain (right), and S/P genesight testing (Britney added amitriptyline. Pt nervous to start due to possible reaction)   HPI Migraine Patient is coming in for migraine recheck.  She did not try the amitriptyline because she was concerned about it.  We talked about her concerns and reassured her that she should take it before her bedtime.  She works night shifts that she should take it in the morning  Right knee osteoarthritis Patient has right knee osteoarthritis and she had an injection and she says she only got 1 day relief out of the injection and then it came right back and did not help her.  She is slotted to go to get an MRI with orthopedic.  Patient has vaginal itching and discharge that is thick.  She did try some over-the-counter Monistat 1 day.  She said it did not getting better and even thinks it got worse after doing the Monistat.  She says the discharge is thick and white.  She denies any odor with it.  Hyperlipidemia Patient is coming in for recheck of his hyperlipidemia. The patient is currently taking no medication. They deny any issues with myalgias or history of liver damage from it. They deny any focal numbness or weakness or chest pain.   Hypertension Patient is currently on amlodipine and lisinopril, and their blood pressure today is 124/77. Patient denies any lightheadedness or dizziness. Patient denies headaches, blurred vision, chest pains, shortness of breath, or weakness. Denies any side effects from medication and is content with current medication.   Relevant past medical, surgical, family and  social history reviewed and updated as indicated. Interim medical history since our last visit reviewed. Allergies and medications reviewed and updated.  Review of Systems  Constitutional:  Negative for chills and fever.  HENT:  Negative for congestion, ear discharge and ear pain.   Eyes:  Negative for redness and visual disturbance.  Respiratory:  Negative for chest tightness and shortness of breath.   Cardiovascular:  Negative for chest pain and leg swelling.  Gastrointestinal:  Negative for abdominal pain.  Genitourinary:  Positive for vaginal discharge. Negative for difficulty urinating, dysuria, pelvic pain, vaginal bleeding and vaginal pain.  Musculoskeletal:  Positive for arthralgias. Negative for back pain and gait problem.  Skin:  Negative for rash.  Neurological:  Negative for light-headedness and headaches.  Psychiatric/Behavioral:  Negative for agitation and behavioral problems.   All other systems reviewed and are negative.  Per HPI unless specifically indicated above   Allergies as of 08/02/2021       Reactions   Imitrex [sumatriptan] Anaphylaxis, Other (See Comments)   Could not swallow after taking med   Latuda [lurasidone Hcl] Other (See Comments)   Suicidal thoughts   Wellbutrin [bupropion] Other (See Comments)   Seizures / lowered seizure threshold   Zoloft [sertraline Hcl] Hives   Alprazolam Other (See Comments)   Passed out   Haloperidol And Related Other (See Comments)   Leg cramps   Nicoderm [nicotine] Dermatitis  Medication List        Accurate as of Aug 02, 2021  2:24 PM. If you have any questions, ask your nurse or doctor.          STOP taking these medications    tiZANidine 4 MG tablet Commonly known as: ZANAFLEX Stopped by: Fransisca Kaufmann Issabela Lesko, MD       TAKE these medications    amitriptyline 25 MG tablet Commonly known as: ELAVIL Take 1 tablet (25 mg total) by mouth at bedtime. What changed: See the new  instructions. Changed by: Fransisca Kaufmann Davonne Baby, MD   amLODipine 5 MG tablet Commonly known as: NORVASC Take 1 tablet (5 mg total) by mouth daily.   aspirin-acetaminophen-caffeine 250-250-65 MG tablet Commonly known as: EXCEDRIN MIGRAINE Take 2 tablets by mouth every 6 (six) hours as needed for headache or migraine.   estradiol 2 MG tablet Commonly known as: ESTRACE Take 1 tablet (2 mg total) by mouth at bedtime.   fluconazole 150 MG tablet Commonly known as: Diflucan Take 1 tablet (150 mg total) by mouth once for 1 dose. Started by: Fransisca Kaufmann Elsbeth Yearick, MD   fluticasone 50 MCG/ACT nasal spray Commonly known as: FLONASE Place 1 spray into both nostrils 2 (two) times daily as needed for allergies or rhinitis.   lisinopril 5 MG tablet Commonly known as: ZESTRIL Take 1 tablet (5 mg total) by mouth daily.   meloxicam 7.5 MG tablet Commonly known as: MOBIC Take 1 tablet (7.5 mg total) by mouth daily. (NEEDS TO BE SEEN BEFORE NEXT REFILL)   PREVACID PO Take 1 tablet by mouth daily at 6 (six) AM.         Objective:   BP 124/77   Pulse 63   Temp 98 F (36.7 C)   Ht '5\' 3"'$  (1.6 m)   Wt 174 lb (78.9 kg)   LMP 08/10/2016 Comment: [place]  SpO2 100%   BMI 30.82 kg/m   Wt Readings from Last 3 Encounters:  08/02/21 174 lb (78.9 kg)  07/27/21 171 lb 3.2 oz (77.7 kg)  05/12/21 171 lb (77.6 kg)    Physical Exam Vitals and nursing note reviewed. Exam conducted with a chaperone present.  Constitutional:      General: She is not in acute distress.    Appearance: She is well-developed. She is not diaphoretic.  Eyes:     Conjunctiva/sclera: Conjunctivae normal.  Cardiovascular:     Rate and Rhythm: Normal rate and regular rhythm.     Heart sounds: Normal heart sounds. No murmur heard. Pulmonary:     Effort: Pulmonary effort is normal. No respiratory distress.     Breath sounds: Normal breath sounds. No wheezing.  Abdominal:     General: Abdomen is flat. Bowel sounds are  normal. There is no distension.     Tenderness: There is no abdominal tenderness.     Hernia: No hernia is present.  Genitourinary:    Exam position: Lithotomy position.     Cervix: Discharge present. No friability or erythema.     Uterus: Normal. Not fixed and not tender.   Musculoskeletal:        General: No swelling or tenderness. Normal range of motion.  Skin:    General: Skin is warm and dry.     Findings: No rash.  Neurological:     Mental Status: She is alert and oriented to person, place, and time.     Coordination: Coordination normal.  Psychiatric:  Behavior: Behavior normal.      Assessment & Plan:   Problem List Items Addressed This Visit       Cardiovascular and Mediastinum   Essential hypertension, benign   Migraine headache - Primary   Relevant Medications   amitriptyline (ELAVIL) 25 MG tablet     Other   Hyperlipidemia LDL goal <100   Posttraumatic stress disorder   Relevant Medications   amitriptyline (ELAVIL) 25 MG tablet   Other Visit Diagnoses     Vaginal discharge       Relevant Medications   fluconazole (DIFLUCAN) 150 MG tablet   Other Relevant Orders   Cervicovaginal ancillary only       We will do Diflucan for vaginal discharge and send off a swab but it did look more yeast in nature.  Patient will try the amitriptyline.  She never tried it because she was concerned about side effect of making her sleepy.  Recommended that she take it before bedtime on what ever time shift or schedule she is on before she plans to go to bed. Follow up plan: Return if symptoms worsen or fail to improve, for 1 to 63-monthrecheck on migraines.  Counseling provided for all of the vaccine components No orders of the defined types were placed in this encounter.   JCaryl Pina MD [facility] 08/02/2021, 2:24 PM

## 2021-08-04 ENCOUNTER — Other Ambulatory Visit: Payer: Self-pay | Admitting: Family Medicine

## 2021-08-04 DIAGNOSIS — N898 Other specified noninflammatory disorders of vagina: Secondary | ICD-10-CM

## 2021-08-04 LAB — CERVICOVAGINAL ANCILLARY ONLY
Bacterial Vaginitis (gardnerella): NEGATIVE
Candida Glabrata: NEGATIVE
Candida Vaginitis: POSITIVE — AB
Chlamydia: NEGATIVE
Comment: NEGATIVE
Comment: NEGATIVE
Comment: NEGATIVE
Comment: NEGATIVE
Comment: NEGATIVE
Comment: NORMAL
Neisseria Gonorrhea: NEGATIVE
Trichomonas: NEGATIVE

## 2021-08-04 MED ORDER — FLUCONAZOLE 150 MG PO TABS: 150.0000 mg | ORAL_TABLET | Freq: Once | ORAL | 0 refills | Status: AC

## 2021-08-04 NOTE — Progress Notes (Signed)
LM- rx has been sent

## 2021-08-11 ENCOUNTER — Ambulatory Visit (HOSPITAL_COMMUNITY)
Admission: RE | Admit: 2021-08-11 | Discharge: 2021-08-11 | Disposition: A | Discharge status: Home / Self Care | Payer: Medicaid Other | Source: Ambulatory Visit | Attending: Orthopedic Surgery | Admitting: Orthopedic Surgery

## 2021-08-11 ENCOUNTER — Ambulatory Visit (HOSPITAL_COMMUNITY): Admission: RE | Admit: 2021-08-11 | Payer: Medicaid Other | Source: Ambulatory Visit

## 2021-08-11 DIAGNOSIS — M23321 Other meniscus derangements, posterior horn of medial meniscus, right knee: Secondary | ICD-10-CM | POA: Insufficient documentation

## 2021-08-14 ENCOUNTER — Ambulatory Visit: Payer: Medicaid Other | Admitting: Orthopedic Surgery

## 2021-08-14 ENCOUNTER — Encounter: Payer: Self-pay | Admitting: Orthopedic Surgery

## 2021-08-14 DIAGNOSIS — Z01818 Encounter for other preprocedural examination: Secondary | ICD-10-CM | POA: Diagnosis not present

## 2021-08-14 DIAGNOSIS — M23300 Other meniscus derangements, unspecified lateral meniscus, right knee: Secondary | ICD-10-CM | POA: Diagnosis not present

## 2021-08-14 DIAGNOSIS — M23321 Other meniscus derangements, posterior horn of medial meniscus, right knee: Secondary | ICD-10-CM

## 2021-08-14 NOTE — Patient Instructions (Addendum)
Meniscus Injury, Arthroscopy   Arthroscopy is a surgical procedure that involves the use of a small scope that has a camera and surgical instruments on the end (arthroscope). An arthroscope can be used to repair your meniscus injury.  LET [facility] CARE PROVIDER KNOW ABOUT: Any allergies you have. All medicines you are taking, including vitamins, herbs, eyedrops, creams, and over-the-counter medicines. Any recent colds or infections you have had or currently have. Previous problems you or members of your family have had with the use of anesthetics. Any blood disorders or blood clotting problems you have. Previous surgeries you have had. Medical conditions you have. RISKS AND COMPLICATIONS Generally, this is a safe procedure. However, as with any procedure, problems can occur. Possible problems include: Damage to nerves or blood vessels. Excess bleeding. Blood clots. Infection. BEFORE THE PROCEDURE Do not eat or drink for 6-8 hours before the procedure. Take medicines as directed by your surgeon. Ask your surgeon about changing or stopping your regular medicines. You may have lab tests the morning of surgery. PROCEDURE  You will be given one of the following:  A medicine that numbs the area (local anesthesia). A medicine that makes you go to sleep (general anesthesia). A medicine injected into your spine that numbs your body below the waist (spinal anesthesia). Most often, several small cuts (incisions) are made in the knee. The arthroscope and instruments go into the incisions to repair the damage. The torn portion of the meniscus is removed.   AFTER THE PROCEDURE You will be taken to the recovery area where your progress will be monitored. When you are awake, stable, and taking fluids without complications, you will be allowed to go home. This is usually the same day. A torn or stretched ligament (ligament sprain) may take 6-8 weeks to heal.  It takes about the 4-6 WEEKS if your  surgeon removed a torn meniscus. A repaired meniscus may require 6-12 weeks of recovery time. A torn ligament needing reconstructive surgery may take 6-12 months to heal fully.   This information is not intended to replace advice given to you by your health care provider. Make sure you discuss any questions you have with your health care provider. You have decided to proceed with operative arthroscopy of the knee. You have decided not to continue with nonoperative measures such as but not limited to oral medication, weight loss, activity modification, physical therapy, bracing, or injection.  We will perform operative arthroscopy of the knee. Some of the risks associated with arthroscopic surgery of the knee include but are not limited to Bleeding Infection Swelling Stiffness Blood clot Pain Need for knee replacement surgery    In compliance with recent [place] law in federal regulation regarding opioid use and abuse and addiction, we will taper (stop) opioid medication after 2 weeks.  If you're not comfortable with these risks and would like to continue with nonoperative treatment please let Dr. Aline Brochure know prior to your surgery.   Your surgery will be at [facility] by Dr Aline Brochure  The hospital will contact you with a preoperative appointment to discuss Anesthesia. The phone number is [phone]  Please bring your medications with you for the appointment. They will tell you the arrival time and medication instructions when you have your preoperative evaluation. Do not wear nail polish the day of your surgery and if you take Phentermine you need to stop this medication ONE WEEK prior to your surgery.

## 2021-08-14 NOTE — Addendum Note (Signed)
Addended byCandice Camp on: 08/14/2021 12:16 PM   Modules accepted: Orders

## 2021-08-14 NOTE — Progress Notes (Signed)
  Chief Complaint  Patient presents with   Results    MRI -Right Knee   This is a 46 year old female who had an MRI of her right knee she continued complaint of medial lateral knee pain catching popping and giving way.  She especially has trouble kneeling down at work and getting back up  I was able to review her MRI scan.  She has a small lateral meniscus tear and that she has a medial meniscus tear as well  I discussed this with her.  Her tear seems small her symptoms seem large.  However based on her previous attempts at treatment including NSAIDs physical therapy injection she has not improved and wishes to proceed with arthroscopic surgery on the right knee for partial medial lateral meniscectomies.  This will be arranged date of her convenience.   Encounter Diagnoses  Name Primary?   Other meniscus derangements, posterior horn of medial meniscus, right knee Yes   Derangement of unspecified lateral meniscus due to old tear or injury, right knee     Please be advised:  You are on a narcotic pain medication to control your pain. Medications such as hydrocodone, Norco, oxycodone, OxyContin,  Dilaudid and similar medications have been associated with dependence and addiction. Please work with me to try to eliminate these medications as soon as possible to avoid complications of dependence and addiction.  Only take these medications as prescribed and for the problem they were prescribed for. Once your injury has healed please wean yourself from these medications. Once your surgery has been completed please start taking as little of this medication as possible.  If you have a problem taking yourself off of these medications please discuss with me so that together we can avoid serious complications which include dependence, addiction and death.

## 2021-08-16 ENCOUNTER — Telehealth: Payer: Self-pay | Admitting: Orthopedic Surgery

## 2021-08-16 NOTE — Telephone Encounter (Signed)
Patient is asking if she may have something prescribed by Dr Aline Brochure for the pain while awaiting knee surgery 09/19/21? If so, [facility] in [place]

## 2021-08-17 MED ORDER — IBUPROFEN 800 MG PO TABS: 800.0000 mg | ORAL_TABLET | Freq: Three times a day (TID) | ORAL | 5 refills | Status: DC | PRN

## 2021-08-17 NOTE — Telephone Encounter (Signed)
I called her to advise, sent in the ibuprofen and she said that was fine Explained no opiods need those for after surgery

## 2021-08-17 NOTE — Addendum Note (Signed)
Addended byCandice Camp on: 08/17/2021 11:00 AM   Modules accepted: Orders

## 2021-08-17 NOTE — Telephone Encounter (Signed)
I CAN CHANGE THE MELOXICAM TO IBUPROFEN BUT NO OPIOIDS BEFORE SURGERY

## 2021-09-13 NOTE — Patient Instructions (Signed)
Stacy Wolf  09/13/2021     '@[email or web address]'$ @   Your procedure is scheduled on  09/19/2021.   Report to [facility] at  0600 A.M.   Call this number if you have problems the morning of surgery:  [phone]   Remember:  Do not eat or drink after midnight.      Take these medicines the morning of surgery with A SIP OF WATER                                Norvasc, protonix.     Do not wear jewelry, make-up or nail polish.  Do not wear lotions, powders, or perfumes, or deodorant.  Do not shave 48 hours prior to surgery.  Men may shave face and neck.  Do not bring valuables to the hospital.  [facility] is not responsible for any belongings or valuables.  Contacts, dentures or bridgework may not be worn into surgery.  Leave your suitcase in the car.  After surgery it may be brought to your room.  For patients admitted to the hospital, discharge time will be determined by your treatment team.  Patients discharged the day of surgery will not be allowed to drive home and must have someone with them for 24 hours.    Special instructions:   DO NOT smoke tobacco or vape for 24 hours before your procedure.  Please read over the following fact sheets that you were given. Coughing and Deep Breathing, Surgical Site Infection Prevention, Anesthesia Post-op Instructions, and Care and Recovery After Surgery      Arthroscopic Knee Ligament Repair, Care After This sheet gives you information about how to care for yourself after your procedure. Your health care provider may also give you more specific instructions. If you have problems or questions, contact your health care provider. What can I expect after the procedure? After the procedure, it is common to have: Soreness or pain in your knee. Bruising and swelling on your knee, calf, and ankle for 3-4 days. A small amount of fluid coming from the incisions. Follow these instructions at home: Medicines Take  over-the-counter and prescription medicines only as told by your health care provider. Ask your health care provider if the medicine prescribed to you: Requires you to avoid driving or using machinery. Can cause constipation. You may need to take these actions to prevent or treat constipation: Drink enough fluid to keep your urine pale yellow. Take over-the-counter or prescription medicines. Eat foods that are high in fiber, such as beans, whole grains, and fresh fruits and vegetables. Limit foods that are high in fat and processed sugars, such as fried or sweet foods. If you have a brace or immobilizer: Wear it as told by your health care provider. Remove it only as told by your health care provider. Loosen it if your toes tingle, become numb, or turn cold and blue. Keep it clean and dry. Ask your health care provider when it is safe to drive. Bathing Do not take baths, swim, or use a hot tub until your health care provider approves. Keep your bandage (dressing) dry until your health care provider says that it can be removed. If the brace or immobilizer is not waterproof: Do not let it get wet. Cover it with a watertight covering when you take a bath or shower. Incision care  Follow instructions from your health care provider  about how to take care of your incisions. Make sure you: Wash your hands with soap and water for at least 20 seconds before and after you change your dressing. If soap and water are not available, use hand sanitizer. Change your dressing as told by your health care provider. Leave stitches (sutures), skin glue, or adhesive strips in place. These skin closures may need to stay in place for 2 weeks or longer. If adhesive strip edges start to loosen and curl up, you may trim the loose edges. Do not remove adhesive strips completely unless your health care provider tells you to do that. Check your incision areas every day for signs of infection. Check for: Redness. More  swelling or pain. Blood or more fluid. Warmth. Pus or a bad smell. Managing pain, stiffness, and swelling  If directed, put ice on the affected area. To do this: If you have a removable brace or immobilizer, remove it as told by your health care provider. Put ice in a plastic bag. Place a towel between your skin and the bag. Leave the ice on for 20 minutes, 2-3 times a day. Remove the ice if your skin turns bright red. This is very important. If you cannot feel pain, heat, or cold, you have a greater risk of damage to the area. Move your toes often to reduce stiffness and swelling. Raise (elevate) the injured area above the level of your heart while you are sitting or lying down. Activity Do not use your knee to support your body weight until your health care provider says that you can. Use crutches or other devices as told by your health care provider. Do physical therapy exercises as told by your health care provider. Physical therapy will help you regain movement and strength in your knee. Follow instructions from your health care provider about: When you may start motion exercises. When you may start riding a stationary bike and doing other low-impact activities. When you may start to jog and do other high-impact activities. Do not lift anything that is heavier than 10 lb (4.5 kg), or the limit that you are told, until your health care provider says that it is safe. Ask your health care provider what activities are safe for you. General instructions Do not use any products that contain nicotine or tobacco, such as cigarettes, e-cigarettes, and chewing tobacco. These can delay healing. If you need help quitting, ask your health care provider. Wear compression stockings as told by your health care provider. These stockings help to prevent blood clots and reduce swelling in your legs. Keep all follow-up visits. This is important. Contact a health care provider if: You have any of these  signs of infection: Redness around an incision. Blood or more fluid coming from an incision. Warmth coming from an incision. Pus or a bad smell coming from an incision. More swelling or pain in your knee. A fever or chills. You have pain that does not get better with medicine. Your incision opens up. Get help right away if: You have trouble breathing. You have chest pain. You have increased pain or swelling in your calf or at the back of your knee. You have numbness and tingling near the knee joint or in the foot, ankle, or toes. You notice that your foot or toes look darker than normal or are cooler than normal. These symptoms may represent a serious problem that is an emergency. Do not wait to see if the symptoms will go away. Get medical help  right away. Call your local emergency services (911 in the [place] Do not drive yourself to the hospital. Summary After the procedure, it is common to have knee pain with bruising and swelling on your knee, calf, and ankle. Icing your knee and raising your leg above the level of your heart will help control the pain and swelling. Do physical therapy exercises as told by your health care provider. Physical therapy will help you regain movement and strength in your knee. This information is not intended to replace advice given to you by your health care provider. Make sure you discuss any questions you have with your health care provider. Document Revised: 07/20/2019 Document Reviewed: 07/20/2019 Elsevier Patient Education  [place] Anesthesia, Adult, Care After This sheet gives you information about how to care for yourself after your procedure. Your health care provider may also give you more specific instructions. If you have problems or questions, contact your health care provider. What can I expect after the procedure? After the procedure, the following side effects are common: Pain or discomfort at the IV  site. Nausea. Vomiting. Sore throat. Trouble concentrating. Feeling cold or chills. Feeling weak or tired. Sleepiness and fatigue. Soreness and body aches. These side effects can affect parts of the body that were not involved in surgery. Follow these instructions at home: For the time period you were told by your health care provider:  Rest. Do not participate in activities where you could fall or become injured. Do not drive or use machinery. Do not drink alcohol. Do not take sleeping pills or medicines that cause drowsiness. Do not make important decisions or sign legal documents. Do not take care of children on your own. Eating and drinking Follow any instructions from your health care provider about eating or drinking restrictions. When you feel hungry, start by eating small amounts of foods that are soft and easy to digest (bland), such as toast. Gradually return to your regular diet. Drink enough fluid to keep your urine pale yellow. If you vomit, rehydrate by drinking water, juice, or clear broth. General instructions If you have sleep apnea, surgery and certain medicines can increase your risk for breathing problems. Follow instructions from your health care provider about wearing your sleep device: Anytime you are sleeping, including during daytime naps. While taking prescription pain medicines, sleeping medicines, or medicines that make you drowsy. Have a responsible adult stay with you for the time you are told. It is important to have someone help care for you until you are awake and alert. Return to your normal activities as told by your health care provider. Ask your health care provider what activities are safe for you. Take over-the-counter and prescription medicines only as told by your health care provider. If you smoke, do not smoke without supervision. Keep all follow-up visits as told by your health care provider. This is important. Contact a health care  provider if: You have nausea or vomiting that does not get better with medicine. You cannot eat or drink without vomiting. You have pain that does not get better with medicine. You are unable to pass urine. You develop a skin rash. You have a fever. You have redness around your IV site that gets worse. Get help right away if: You have difficulty breathing. You have chest pain. You have blood in your urine or stool, or you vomit blood. Summary After the procedure, it is common to have a sore throat or nausea. It is also common  to feel tired. Have a responsible adult stay with you for the time you are told. It is important to have someone help care for you until you are awake and alert. When you feel hungry, start by eating small amounts of foods that are soft and easy to digest (bland), such as toast. Gradually return to your regular diet. Drink enough fluid to keep your urine pale yellow. Return to your normal activities as told by your health care provider. Ask your health care provider what activities are safe for you. This information is not intended to replace advice given to you by your health care provider. Make sure you discuss any questions you have with your health care provider. Document Revised: 11/05/2019 Document Reviewed: 06/04/2019 Elsevier Patient Education  [place]. How to Use Chlorhexidine for Bathing Chlorhexidine gluconate (CHG) is a germ-killing (antiseptic) solution that is used to clean the skin. It can get rid of the bacteria that normally live on the skin and can keep them away for about 24 hours. To clean your skin with CHG, you may be given: A CHG solution to use in the shower or as part of a sponge bath. A prepackaged cloth that contains CHG. Cleaning your skin with CHG may help lower the risk for infection: While you are staying in the intensive care unit of the hospital. If you have a vascular access, such as a central line, to provide short-term or  long-term access to your veins. If you have a catheter to drain urine from your bladder. If you are on a ventilator. A ventilator is a machine that helps you breathe by moving air in and out of your lungs. After surgery. What are the risks? Risks of using CHG include: A skin reaction. Hearing loss, if CHG gets in your ears and you have a perforated eardrum. Eye injury, if CHG gets in your eyes and is not rinsed out. The CHG product catching fire. Make sure that you avoid smoking and flames after applying CHG to your skin. Do not use CHG: If you have a chlorhexidine allergy or have previously reacted to chlorhexidine. On babies younger than 71 months of age. How to use CHG solution Use CHG only as told by your health care provider, and follow the instructions on the label. Use the full amount of CHG as directed. Usually, this is one bottle. During a shower Follow these steps when using CHG solution during a shower (unless your health care provider gives you different instructions): Start the shower. Use your normal soap and shampoo to wash your face and hair. Turn off the shower or move out of the shower stream. Pour the CHG onto a clean washcloth. Do not use any type of brush or rough-edged sponge. Starting at your neck, lather your body down to your toes. Make sure you follow these instructions: If you will be having surgery, pay special attention to the part of your body where you will be having surgery. Scrub this area for at least 1 minute. Do not use CHG on your head or face. If the solution gets into your ears or eyes, rinse them well with water. Avoid your genital area. Avoid any areas of skin that have broken skin, cuts, or scrapes. Scrub your back and under your arms. Make sure to wash skin folds. Let the lather sit on your skin for 1-2 minutes or as long as told by your health care provider. Thoroughly rinse your entire body in the shower. Make sure  that all body creases and  crevices are rinsed well. Dry off with a clean towel. Do not put any substances on your body afterward--such as powder, lotion, or perfume--unless you are told to do so by your health care provider. Only use lotions that are recommended by the manufacturer. Put on clean clothes or pajamas. If it is the night before your surgery, sleep in clean sheets.  During a sponge bath Follow these steps when using CHG solution during a sponge bath (unless your health care provider gives you different instructions): Use your normal soap and shampoo to wash your face and hair. Pour the CHG onto a clean washcloth. Starting at your neck, lather your body down to your toes. Make sure you follow these instructions: If you will be having surgery, pay special attention to the part of your body where you will be having surgery. Scrub this area for at least 1 minute. Do not use CHG on your head or face. If the solution gets into your ears or eyes, rinse them well with water. Avoid your genital area. Avoid any areas of skin that have broken skin, cuts, or scrapes. Scrub your back and under your arms. Make sure to wash skin folds. Let the lather sit on your skin for 1-2 minutes or as long as told by your health care provider. Using a different clean, wet washcloth, thoroughly rinse your entire body. Make sure that all body creases and crevices are rinsed well. Dry off with a clean towel. Do not put any substances on your body afterward--such as powder, lotion, or perfume--unless you are told to do so by your health care provider. Only use lotions that are recommended by the manufacturer. Put on clean clothes or pajamas. If it is the night before your surgery, sleep in clean sheets. How to use CHG prepackaged cloths Only use CHG cloths as told by your health care provider, and follow the instructions on the label. Use the CHG cloth on clean, dry skin. Do not use the CHG cloth on your head or face unless your health  care provider tells you to. When washing with the CHG cloth: Avoid your genital area. Avoid any areas of skin that have broken skin, cuts, or scrapes. Before surgery Follow these steps when using a CHG cloth to clean before surgery (unless your health care provider gives you different instructions): Using the CHG cloth, vigorously scrub the part of your body where you will be having surgery. Scrub using a back-and-forth motion for 3 minutes. The area on your body should be completely wet with CHG when you are done scrubbing. Do not rinse. Discard the cloth and let the area air-dry. Do not put any substances on the area afterward, such as powder, lotion, or perfume. Put on clean clothes or pajamas. If it is the night before your surgery, sleep in clean sheets.  For general bathing Follow these steps when using CHG cloths for general bathing (unless your health care provider gives you different instructions). Use a separate CHG cloth for each area of your body. Make sure you wash between any folds of skin and between your fingers and toes. Wash your body in the following order, switching to a new cloth after each step: The front of your neck, shoulders, and chest. Both of your arms, under your arms, and your hands. Your stomach and groin area, avoiding the genitals. Your right leg and foot. Your left leg and foot. The back of your neck, your back, and  your buttocks. Do not rinse. Discard the cloth and let the area air-dry. Do not put any substances on your body afterward--such as powder, lotion, or perfume--unless you are told to do so by your health care provider. Only use lotions that are recommended by the manufacturer. Put on clean clothes or pajamas. Contact a health care provider if: Your skin gets irritated after scrubbing. You have questions about using your solution or cloth. You swallow any chlorhexidine. Call your local poison control center (1-(639) 434-6710 in the [place] Get help  right away if: Your eyes itch badly, or they become very red or swollen. Your skin itches badly and is red or swollen. Your hearing changes. You have trouble seeing. You have swelling or tingling in your mouth or throat. You have trouble breathing. These symptoms may represent a serious problem that is an emergency. Do not wait to see if the symptoms will go away. Get medical help right away. Call your local emergency services (911 in the [place] Do not drive yourself to the hospital. Summary Chlorhexidine gluconate (CHG) is a germ-killing (antiseptic) solution that is used to clean the skin. Cleaning your skin with CHG may help to lower your risk for infection. You may be given CHG to use for bathing. It may be in a bottle or in a prepackaged cloth to use on your skin. Carefully follow your health care provider's instructions and the instructions on the product label. Do not use CHG if you have a chlorhexidine allergy. Contact your health care provider if your skin gets irritated after scrubbing. This information is not intended to replace advice given to you by your health care provider. Make sure you discuss any questions you have with your health care provider. Document Revised: 05/02/2020 Document Reviewed: 05/02/2020 Elsevier Patient Education  [place].

## 2021-09-15 ENCOUNTER — Encounter (HOSPITAL_COMMUNITY): Payer: Self-pay

## 2021-09-15 ENCOUNTER — Encounter (HOSPITAL_COMMUNITY)
Admission: RE | Admit: 2021-09-15 | Discharge: 2021-09-15 | Disposition: A | Discharge status: Home / Self Care | Payer: Medicaid Other | Source: Ambulatory Visit | Attending: Orthopedic Surgery | Admitting: Orthopedic Surgery

## 2021-09-15 VITALS — BP 120/79 | HR 75 | Temp 97.9°F | Resp 18 | Ht 63.0 in | Wt 173.9 lb

## 2021-09-15 DIAGNOSIS — Z720 Tobacco use: Secondary | ICD-10-CM | POA: Diagnosis not present

## 2021-09-15 DIAGNOSIS — M23321 Other meniscus derangements, posterior horn of medial meniscus, right knee: Secondary | ICD-10-CM | POA: Diagnosis not present

## 2021-09-15 DIAGNOSIS — Z01818 Encounter for other preprocedural examination: Secondary | ICD-10-CM | POA: Insufficient documentation

## 2021-09-15 DIAGNOSIS — M23300 Other meniscus derangements, unspecified lateral meniscus, right knee: Secondary | ICD-10-CM | POA: Diagnosis not present

## 2021-09-15 DIAGNOSIS — I10 Essential (primary) hypertension: Secondary | ICD-10-CM | POA: Insufficient documentation

## 2021-09-15 HISTORY — DX: Personal history of urinary calculi: Z87.442

## 2021-09-15 LAB — CBC WITH DIFFERENTIAL/PLATELET
Abs Immature Granulocytes: 0.01 10*3/uL (ref 0.00–0.07)
Basophils Absolute: 0 10*3/uL (ref 0.0–0.1)
Basophils Relative: 1 %
Eosinophils Absolute: 0.2 10*3/uL (ref 0.0–0.5)
Eosinophils Relative: 2 %
HCT: 40.9 % (ref 36.0–46.0)
Hemoglobin: 13.5 g/dL (ref 12.0–15.0)
Immature Granulocytes: 0 %
Lymphocytes Relative: 41 %
Lymphs Abs: 3.2 10*3/uL (ref 0.7–4.0)
MCH: 32.5 pg (ref 26.0–34.0)
MCHC: 33 g/dL (ref 30.0–36.0)
MCV: 98.6 fL (ref 80.0–100.0)
Monocytes Absolute: 0.5 10*3/uL (ref 0.1–1.0)
Monocytes Relative: 6 %
Neutro Abs: 3.8 10*3/uL (ref 1.7–7.7)
Neutrophils Relative %: 50 %
Platelets: 256 10*3/uL (ref 150–400)
RBC: 4.15 MIL/uL (ref 3.87–5.11)
RDW: 13.6 % (ref 11.5–15.5)
WBC: 7.7 10*3/uL (ref 4.0–10.5)
nRBC: 0 % (ref 0.0–0.2)

## 2021-09-15 LAB — BASIC METABOLIC PANEL
Anion gap: 5 (ref 5–15)
BUN: 9 mg/dL (ref 6–20)
CO2: 25 mmol/L (ref 22–32)
Calcium: 9 mg/dL (ref 8.9–10.3)
Chloride: 109 mmol/L (ref 98–111)
Creatinine, Ser: 0.94 mg/dL (ref 0.44–1.00)
GFR, Estimated: >60 mL/min (ref >60)
Glucose, Bld: 101 mg/dL — ABNORMAL HIGH (ref 70–99)
Potassium: 4.2 mmol/L (ref 3.5–5.1)
Sodium: 139 mmol/L (ref 135–145)

## 2021-09-18 ENCOUNTER — Ambulatory Visit: Payer: Medicaid Other | Admitting: Family Medicine

## 2021-09-18 ENCOUNTER — Other Ambulatory Visit: Payer: Self-pay | Admitting: Family Medicine

## 2021-09-18 ENCOUNTER — Encounter: Payer: Self-pay | Admitting: Family Medicine

## 2021-09-18 VITALS — BP 127/82 | HR 82 | Temp 98.0°F | Ht 63.0 in | Wt 174.0 lb

## 2021-09-18 DIAGNOSIS — M25561 Pain in right knee: Secondary | ICD-10-CM

## 2021-09-18 DIAGNOSIS — G43009 Migraine without aura, not intractable, without status migrainosus: Secondary | ICD-10-CM

## 2021-09-18 DIAGNOSIS — N898 Other specified noninflammatory disorders of vagina: Secondary | ICD-10-CM

## 2021-09-18 DIAGNOSIS — N946 Dysmenorrhea, unspecified: Secondary | ICD-10-CM

## 2021-09-18 DIAGNOSIS — I10 Essential (primary) hypertension: Secondary | ICD-10-CM

## 2021-09-18 DIAGNOSIS — Z202 Contact with and (suspected) exposure to infections with a predominantly sexual mode of transmission: Secondary | ICD-10-CM | POA: Diagnosis not present

## 2021-09-18 NOTE — H&P (Signed)
Chief Complaint  Patient presents with   Right knee          This is a 46 year old female who had an MRI of her right knee she continued complaint of medial lateral knee pain catching popping and giving way.  She especially has trouble kneeling down at work and getting back up  I was able to review her MRI scan.  She has a small lateral meniscus tear and that she has a medial meniscus tear as well  I discussed this with her.  Her tear seems small her symptoms seem large.  However based on her previous attempts at treatment including NSAIDs physical therapy injection she has not improved and wishes to proceed with arthroscopic surgery on the right knee for partial medial lateral meniscectomies.  Review of systems no numbness tingling chest pain shortness of breath or fever Past Medical History:  Diagnosis Date   Anxiety    on meds   Bipolar 1 disorder ([employer])    Blood transfusion without reported diagnosis 2000   Depression    on meds   GERD (gastroesophageal reflux disease)    on meds   Glaucoma    LEFT eye- not a surgical candidate at this time (02/06/2021)   Heart murmur    History of kidney stones    Hypertension    on meds   Migraines    Schizophrenia ([place])    Seasonal allergies    Past Surgical History:  Procedure Laterality Date   BILATERAL SALPINGECTOMY Bilateral 08/29/2016   Procedure: BILATERAL SALPINGECTOMY;  Surgeon: Florian Buff, MD;  Location: [facility];  Service: Gynecology;  Laterality: Bilateral;   CARPAL TUNNEL RELEASE Left 04/11/2016   Procedure: CARPAL TUNNEL RELEASE;  Surgeon: [name], MD;  Location: [facility];  Service: Orthopedics;  Laterality: Left;   CARPAL TUNNEL RELEASE Right 03/13/2018   Procedure: RIGHT CARPAL TUNNEL RELEASE;  Surgeon: [name], MD;  Location: [facility];  Service: Orthopedics;  Laterality: Right;   SUPRACERVICAL ABDOMINAL HYSTERECTOMY N/A 08/29/2016   Procedure: HYSTERECTOMY SUPRACERVICAL ABDOMINAL;  Surgeon: Florian Buff, MD;  Location: [facility];  Service: Gynecology;  Laterality: N/A;   TRIGGER FINGER RELEASE Right 11/18/2018   Procedure: RELEASE TRIGGER FINGER/A-1 PULLEY right ring;  Surgeon: [name], MD;  Location: [facility];  Service: Orthopedics;  Laterality: Right;   TUBAL LIGATION     WISDOM TOOTH EXTRACTION     WRIST SURGERY Right 2006   tendonitis   Family History  Problem Relation Age of Onset   Breast cancer Mother    Cancer Mother 6       breast CA at 39 and uterine CA at 79   Hypertension Mother    Kidney disease Mother        kidney transplant   Diabetes Father    Liver disease Father    Cancer Sister 44       uterine   Cancer Sister 11       uterine   Breast cancer Maternal Aunt    Cancer Maternal Aunt        breast   Colon polyps Neg Hx    Colon cancer Neg Hx    Esophageal cancer Neg Hx    Rectal cancer Neg Hx    Stomach cancer Neg Hx    Social History   Tobacco Use   Smoking status: Every Day    Packs/day: 2.00    Years: 7.00    Total pack years:  14.00    Types: Cigars, Cigarettes   Smokeless tobacco: Never   Tobacco comments:    1 cigar per day  Vaping Use   Vaping Use: Never used  Substance Use Topics   Alcohol use: Not Currently    Alcohol/week: 0.0 - 1.0 standard drinks of alcohol    Comment: occasionally   Drug use: Yes    Frequency: 4.0 times per week    Types: Marijuana    Comment: 09/15/21    General appearance normal  Neurovascular exam normal pulse perfusion  Normal sensation  Normal mood and affect alert and oriented x3  Lymph nodes negative  Right knee alignment is normal skin is normal range of motion normal nonspecific tenderness around the joint McMurray's equivocal ligaments stable  X-rays no significant arthritis or acute fracture  MRI small lateral meniscus tear and a medial meniscus tear as well  Assessment and plan medial and lateral meniscal tears on MRI right knee  Arthroscopy right knee medial lateral  meniscectomy

## 2021-09-18 NOTE — Progress Notes (Signed)
BP 127/82   Pulse 82   Temp 98 F (36.7 C)   Ht '5\' 3"'$  (1.6 m)   Wt 174 lb (78.9 kg)   LMP 08/10/2016 Comment: [place]  SpO2 97%   BMI 30.82 kg/m    Subjective:   Patient ID: Stacy Wolf, female    DOB: 05-Sep-1975, 46 y.o.   MRN: [id number]  HPI: Stacy Wolf is a 46 y.o. female presenting on 09/18/2021 for Medical Management of Chronic Issues and Migraine   HPI Migraine Patient is coming in for migraine today.  She says she has not really had migraines over the past 2 months and feels like she is doing well with that.  She says she is doing well.  Patient comes in complaining of vaginal discharge and irritation.  She is concerned because her husband is a truck driver he was gone for couple weeks and came home with some irritation on his penis and she is concerned that he may have picked up something or may be cheating on her.  She cannot confirm this but she does want to do the testing.  Relevant past medical, surgical, family and social history reviewed and updated as indicated. Interim medical history since our last visit reviewed. Allergies and medications reviewed and updated.  Review of Systems  Constitutional:  Negative for chills and fever.  Eyes:  Negative for redness and visual disturbance.  Respiratory:  Negative for chest tightness and shortness of breath.   Cardiovascular:  Negative for chest pain and leg swelling.  Musculoskeletal:  Negative for back pain and gait problem.  Skin:  Negative for rash.  Neurological:  Negative for light-headedness and headaches.  Psychiatric/Behavioral:  Negative for agitation and behavioral problems.   All other systems reviewed and are negative.   Per HPI unless specifically indicated above   Allergies as of 09/18/2021       Reactions   Imitrex [sumatriptan] Anaphylaxis, Other (See Comments)   Could not swallow after taking med   Latuda [lurasidone Hcl] Other (See Comments)   Suicidal thoughts   Wellbutrin [bupropion]  Other (See Comments)   Seizures / lowered seizure threshold   Zoloft [sertraline Hcl] Hives   Xanax [alprazolam] Other (See Comments)   Passed out   Haloperidol And Related Other (See Comments)   Leg cramps   Nicoderm [nicotine] Dermatitis        Medication List        Accurate as of September 18, 2021  8:28 AM. If you have any questions, ask your nurse or doctor.          STOP taking these medications    amitriptyline 25 MG tablet Commonly known as: ELAVIL Stopped by: Fransisca Kaufmann Mindi Akerson, MD       TAKE these medications    amLODipine 5 MG tablet Commonly known as: NORVASC Take 1 tablet (5 mg total) by mouth daily.   aspirin-acetaminophen-caffeine 250-250-65 MG tablet Commonly known as: EXCEDRIN MIGRAINE Take 2 tablets by mouth every 6 (six) hours as needed for headache or migraine.   estradiol 2 MG tablet Commonly known as: ESTRACE Take 1 tablet (2 mg total) by mouth at bedtime.   fluticasone 50 MCG/ACT nasal spray Commonly known as: FLONASE Place 1 spray into both nostrils 2 (two) times daily as needed for allergies or rhinitis.   ibuprofen 800 MG tablet Commonly known as: ADVIL Take 1 tablet (800 mg total) by mouth every 8 (eight) hours as needed.   lisinopril 5 MG  tablet Commonly known as: ZESTRIL Take 1 tablet (5 mg total) by mouth daily.   pantoprazole 20 MG tablet Commonly known as: PROTONIX Take 20 mg by mouth daily.         Objective:   BP 127/82   Pulse 82   Temp 98 F (36.7 C)   Ht '5\' 3"'$  (1.6 m)   Wt 174 lb (78.9 kg)   LMP 08/10/2016 Comment: [place]  SpO2 97%   BMI 30.82 kg/m   Wt Readings from Last 3 Encounters:  09/18/21 174 lb (78.9 kg)  09/15/21 173 lb 15.1 oz (78.9 kg)  08/02/21 174 lb (78.9 kg)    Physical Exam Vitals and nursing note reviewed.  Constitutional:      General: She is not in acute distress.    Appearance: She is well-developed. She is not diaphoretic.  Eyes:     Conjunctiva/sclera: Conjunctivae normal.   Cardiovascular:     Rate and Rhythm: Normal rate and regular rhythm.     Heart sounds: Normal heart sounds. No murmur heard. Pulmonary:     Effort: Pulmonary effort is normal. No respiratory distress.     Breath sounds: Normal breath sounds. No wheezing.  Musculoskeletal:        General: No swelling or tenderness. Normal range of motion.  Skin:    General: Skin is warm and dry.     Findings: No rash.  Neurological:     Mental Status: She is alert and oriented to person, place, and time.     Coordination: Coordination normal.  Psychiatric:        Behavior: Behavior normal.       Assessment & Plan:   Problem List Items Addressed This Visit       Cardiovascular and Mediastinum   Migraine headache - Primary   Other Visit Diagnoses     Vaginal discharge       Relevant Orders   HSV(herpes simplex vrs) 1+2 ab-IgG   HepB+HepC+HIV Panel   RPR   Urine cytology ancillary only   Possible exposure to STD       Relevant Orders   HSV(herpes simplex vrs) 1+2 ab-IgG   HepB+HepC+HIV Panel   RPR   Urine cytology ancillary only       We will have patient do STD testing because of possible risk and exposure with blood work and urine.  We will also have her try her amitriptyline if her migraines start up but cut it in half. Follow up plan: Return in about 3 months (around 12/19/2021), or if symptoms worsen or fail to improve, for Migraine.  Counseling provided for all of the vaccine components Orders Placed This Encounter  Procedures   HSV(herpes simplex vrs) 1+2 ab-IgG   HepB+HepC+HIV Panel   RPR    Caryl Pina, MD [facility] 09/18/2021, 8:28 AM

## 2021-09-19 ENCOUNTER — Ambulatory Visit (HOSPITAL_COMMUNITY): Payer: Medicaid Other | Admitting: Anesthesiology

## 2021-09-19 ENCOUNTER — Other Ambulatory Visit: Payer: Self-pay

## 2021-09-19 ENCOUNTER — Encounter (HOSPITAL_COMMUNITY)
Admission: RE | Disposition: A | Discharge status: Home / Self Care | Payer: Self-pay | Source: Home / Self Care | Attending: Orthopedic Surgery

## 2021-09-19 ENCOUNTER — Encounter (HOSPITAL_COMMUNITY): Payer: Self-pay | Admitting: Orthopedic Surgery

## 2021-09-19 ENCOUNTER — Encounter: Payer: Self-pay | Admitting: Orthopedic Surgery

## 2021-09-19 ENCOUNTER — Ambulatory Visit (HOSPITAL_COMMUNITY)
Admission: RE | Admit: 2021-09-19 | Discharge: 2021-09-19 | Disposition: A | Discharge status: Home / Self Care | Payer: Medicaid Other | Attending: Orthopedic Surgery | Admitting: Orthopedic Surgery

## 2021-09-19 ENCOUNTER — Ambulatory Visit (HOSPITAL_BASED_OUTPATIENT_CLINIC_OR_DEPARTMENT_OTHER): Payer: Medicaid Other | Admitting: Anesthesiology

## 2021-09-19 DIAGNOSIS — X58XXXA Exposure to other specified factors, initial encounter: Secondary | ICD-10-CM | POA: Insufficient documentation

## 2021-09-19 DIAGNOSIS — M23300 Other meniscus derangements, unspecified lateral meniscus, right knee: Secondary | ICD-10-CM

## 2021-09-19 DIAGNOSIS — S83281A Other tear of lateral meniscus, current injury, right knee, initial encounter: Secondary | ICD-10-CM

## 2021-09-19 DIAGNOSIS — F17210 Nicotine dependence, cigarettes, uncomplicated: Secondary | ICD-10-CM | POA: Insufficient documentation

## 2021-09-19 DIAGNOSIS — F17290 Nicotine dependence, other tobacco product, uncomplicated: Secondary | ICD-10-CM | POA: Diagnosis not present

## 2021-09-19 DIAGNOSIS — Z8249 Family history of ischemic heart disease and other diseases of the circulatory system: Secondary | ICD-10-CM | POA: Insufficient documentation

## 2021-09-19 DIAGNOSIS — M94261 Chondromalacia, right knee: Secondary | ICD-10-CM | POA: Diagnosis not present

## 2021-09-19 DIAGNOSIS — S83206A Unspecified tear of unspecified meniscus, current injury, right knee, initial encounter: Secondary | ICD-10-CM

## 2021-09-19 DIAGNOSIS — F319 Bipolar disorder, unspecified: Secondary | ICD-10-CM | POA: Insufficient documentation

## 2021-09-19 DIAGNOSIS — F209 Schizophrenia, unspecified: Secondary | ICD-10-CM | POA: Insufficient documentation

## 2021-09-19 DIAGNOSIS — I10 Essential (primary) hypertension: Secondary | ICD-10-CM | POA: Insufficient documentation

## 2021-09-19 DIAGNOSIS — S83241A Other tear of medial meniscus, current injury, right knee, initial encounter: Secondary | ICD-10-CM | POA: Diagnosis present

## 2021-09-19 HISTORY — PX: KNEE ARTHROSCOPY WITH LATERAL MENISECTOMY: SHX6193

## 2021-09-19 LAB — HEPB+HEPC+HIV PANEL
HIV Screen 4th Generation wRfx: NONREACTIVE
Hep B C IgM: NEGATIVE
Hep B Core Total Ab: NEGATIVE
Hep B E Ab: NEGATIVE
Hep B E Ag: NEGATIVE
Hep B Surface Ab, Qual: REACTIVE
Hep C Virus Ab: NONREACTIVE
Hepatitis B Surface Ag: NEGATIVE

## 2021-09-19 LAB — RPR: RPR Ser Ql: NONREACTIVE

## 2021-09-19 LAB — HSV(HERPES SIMPLEX VRS) I + II AB-IGG
HSV 1 Glycoprotein G Ab, IgG: 54.6 index — ABNORMAL HIGH (ref 0.00–0.90)
HSV 2 IgG, Type Spec: 15.1 index — ABNORMAL HIGH (ref 0.00–0.90)

## 2021-09-19 SURGERY — ARTHROSCOPY, KNEE, WITH LATERAL MENISCECTOMY
Anesthesia: General | Site: Knee | Laterality: Right

## 2021-09-19 MED ORDER — LACTATED RINGERS IV SOLN: INTRAVENOUS | Status: DC

## 2021-09-19 MED ORDER — MIDAZOLAM HCL 5 MG/5ML IJ SOLN
INTRAMUSCULAR | Status: DC | PRN
  Administered 2021-09-19: 2 mg via INTRAVENOUS

## 2021-09-19 MED ORDER — FENTANYL CITRATE (PF) 100 MCG/2ML IJ SOLN
INTRAMUSCULAR | Status: DC | PRN
  Administered 2021-09-19: 25 ug via INTRAVENOUS
  Administered 2021-09-19: 50 ug via INTRAVENOUS
  Administered 2021-09-19: 25 ug via INTRAVENOUS
  Administered 2021-09-19: 50 ug via INTRAVENOUS

## 2021-09-19 MED ORDER — DEXAMETHASONE SODIUM PHOSPHATE 10 MG/ML IJ SOLN
INTRAMUSCULAR | Status: DC | PRN
  Administered 2021-09-19: 10 mg via INTRAVENOUS

## 2021-09-19 MED ORDER — FENTANYL CITRATE (PF) 100 MCG/2ML IJ SOLN
INTRAMUSCULAR | Status: AC
  Filled 2021-09-19: qty 2

## 2021-09-19 MED ORDER — BUPIVACAINE-EPINEPHRINE (PF) 0.5% -1:200000 IJ SOLN
INTRAMUSCULAR | Status: AC
  Filled 2021-09-19: qty 60

## 2021-09-19 MED ORDER — ORAL CARE MOUTH RINSE: 15.0000 mL | Freq: Once | OROMUCOSAL | Status: AC

## 2021-09-19 MED ORDER — SODIUM CHLORIDE 0.9 % IR SOLN
Status: DC | PRN
  Administered 2021-09-19 (×2): 3000 mL

## 2021-09-19 MED ORDER — HYDROCODONE-ACETAMINOPHEN 5-325 MG PO TABS: 1.0000 | ORAL_TABLET | ORAL | 0 refills | Status: DC | PRN

## 2021-09-19 MED ORDER — IBUPROFEN 800 MG PO TABS
800.0000 mg | ORAL_TABLET | Freq: Once | ORAL | Status: AC
  Administered 2021-09-19: 800 mg via ORAL
  Filled 2021-09-19: qty 1

## 2021-09-19 MED ORDER — CEFAZOLIN SODIUM-DEXTROSE 2-4 GM/100ML-% IV SOLN
2.0000 g | INTRAVENOUS | Status: AC
  Administered 2021-09-19: 2 g via INTRAVENOUS
  Filled 2021-09-19: qty 100

## 2021-09-19 MED ORDER — MIDAZOLAM HCL 2 MG/2ML IJ SOLN
INTRAMUSCULAR | Status: AC
  Filled 2021-09-19: qty 2

## 2021-09-19 MED ORDER — MEPERIDINE HCL 50 MG/ML IJ SOLN: 6.2500 mg | INTRAMUSCULAR | Status: DC | PRN

## 2021-09-19 MED ORDER — ONDANSETRON HCL 4 MG/2ML IJ SOLN
4.0000 mg | Freq: Once | INTRAMUSCULAR | Status: AC
  Administered 2021-09-19: 4 mg via INTRAVENOUS
  Filled 2021-09-19: qty 2

## 2021-09-19 MED ORDER — DEXAMETHASONE SODIUM PHOSPHATE 10 MG/ML IJ SOLN
INTRAMUSCULAR | Status: AC
  Filled 2021-09-19: qty 2

## 2021-09-19 MED ORDER — CHLORHEXIDINE GLUCONATE 0.12 % MT SOLN
15.0000 mL | Freq: Once | OROMUCOSAL | Status: AC
  Administered 2021-09-19: 15 mL via OROMUCOSAL

## 2021-09-19 MED ORDER — LIDOCAINE HCL (CARDIAC) PF 100 MG/5ML IV SOSY
PREFILLED_SYRINGE | INTRAVENOUS | Status: DC | PRN
  Administered 2021-09-19: 80 mg via INTRAVENOUS

## 2021-09-19 MED ORDER — ONDANSETRON HCL 4 MG/2ML IJ SOLN
INTRAMUSCULAR | Status: DC | PRN
  Administered 2021-09-19: 4 mg via INTRAVENOUS

## 2021-09-19 MED ORDER — ONDANSETRON HCL 4 MG/2ML IJ SOLN
4.0000 mg | Freq: Once | INTRAMUSCULAR | Status: DC | PRN
Start: 2021-09-19 — End: 2021-09-19

## 2021-09-19 MED ORDER — ONDANSETRON HCL 4 MG/2ML IJ SOLN
INTRAMUSCULAR | Status: AC
  Filled 2021-09-19: qty 2

## 2021-09-19 MED ORDER — PROPOFOL 10 MG/ML IV BOLUS
INTRAVENOUS | Status: DC | PRN
  Administered 2021-09-19: 50 mg via INTRAVENOUS
  Administered 2021-09-19: 150 mg via INTRAVENOUS

## 2021-09-19 MED ORDER — HYDROMORPHONE HCL 1 MG/ML IJ SOLN
0.2500 mg | INTRAMUSCULAR | Status: DC | PRN
  Administered 2021-09-19: 0.5 mg via INTRAVENOUS
  Filled 2021-09-19: qty 0.5

## 2021-09-19 MED ORDER — PROMETHAZINE HCL 12.5 MG PO TABS: 12.5000 mg | ORAL_TABLET | Freq: Four times a day (QID) | ORAL | 0 refills | Status: DC | PRN

## 2021-09-19 MED ORDER — BUPIVACAINE-EPINEPHRINE (PF) 0.5% -1:200000 IJ SOLN
INTRAMUSCULAR | Status: DC | PRN
  Administered 2021-09-19: 60 mL via PERINEURAL

## 2021-09-19 MED ORDER — HYDROCODONE-ACETAMINOPHEN 5-325 MG PO TABS
1.0000 | ORAL_TABLET | Freq: Once | ORAL | Status: AC
  Administered 2021-09-19: 1 via ORAL
  Filled 2021-09-19: qty 1

## 2021-09-19 MED ORDER — LIDOCAINE HCL (PF) 2 % IJ SOLN
INTRAMUSCULAR | Status: AC
  Filled 2021-09-19: qty 5

## 2021-09-19 MED ORDER — EPINEPHRINE PF 1 MG/ML IJ SOLN
INTRAMUSCULAR | Status: AC
  Filled 2021-09-19: qty 6

## 2021-09-19 SURGICAL SUPPLY — 45 items
APL PRP STRL LF DISP 70% ISPRP (MISCELLANEOUS) ×2
BLADE SHAVER TORPEDO 4X13 (MISCELLANEOUS) ×1 IMPLANT
BLADE SURG SZ11 CARB STEEL (BLADE) ×2 IMPLANT
BNDG CMPR STD VLCR NS LF 5.8X6 (GAUZE/BANDAGES/DRESSINGS) ×1
BNDG ELASTIC 6X5.8 VLCR NS LF (GAUZE/BANDAGES/DRESSINGS) ×2 IMPLANT
CHLORAPREP W/TINT 26 (MISCELLANEOUS) ×4 IMPLANT
CLOTH BEACON ORANGE TIMEOUT ST (SAFETY) ×2 IMPLANT
COOLER ICEMAN CLASSIC (MISCELLANEOUS) ×2 IMPLANT
CUFF TOURN SGL QUICK 34 (TOURNIQUET CUFF) ×2
CUFF TRNQT CYL 34X4.125X (TOURNIQUET CUFF) IMPLANT
DECANTER SPIKE VIAL GLASS SM (MISCELLANEOUS) ×4 IMPLANT
DRAPE HALF SHEET 40X57 (DRAPES) ×2 IMPLANT
GAUZE 4X4 16PLY ~~LOC~~+RFID DBL (SPONGE) ×2 IMPLANT
GAUZE SPONGE 4X4 12PLY STRL (GAUZE/BANDAGES/DRESSINGS) ×2 IMPLANT
GAUZE XEROFORM 5X9 LF (GAUZE/BANDAGES/DRESSINGS) ×2 IMPLANT
GLOVE BIOGEL PI IND STRL 7.0 (GLOVE) ×2 IMPLANT
GLOVE BIOGEL PI INDICATOR 7.0 (GLOVE) ×2
GLOVE SS N UNI LF 8.5 STRL (GLOVE) ×2 IMPLANT
GLOVE SURG POLYISO LF SZ8 (GLOVE) ×2 IMPLANT
GOWN STRL REUS W/TWL LRG LVL3 (GOWN DISPOSABLE) ×2 IMPLANT
GOWN STRL REUS W/TWL XL LVL3 (GOWN DISPOSABLE) ×2 IMPLANT
IV NS IRRIG 3000ML ARTHROMATIC (IV SOLUTION) ×4 IMPLANT
KIT BLADEGUARD II DBL (SET/KITS/TRAYS/PACK) ×2 IMPLANT
KIT TURNOVER CYSTO (KITS) ×2 IMPLANT
MANIFOLD NEPTUNE II (INSTRUMENTS) ×2 IMPLANT
MARKER SKIN DUAL TIP RULER LAB (MISCELLANEOUS) ×2 IMPLANT
NDL HYPO 18GX1.5 BLUNT FILL (NEEDLE) ×1 IMPLANT
NDL HYPO 21X1.5 SAFETY (NEEDLE) ×1 IMPLANT
NDL SPNL 18GX3.5 QUINCKE PK (NEEDLE) ×1 IMPLANT
NEEDLE HYPO 18GX1.5 BLUNT FILL (NEEDLE) ×2 IMPLANT
NEEDLE HYPO 21X1.5 SAFETY (NEEDLE) ×2 IMPLANT
NEEDLE SPNL 18GX3.5 QUINCKE PK (NEEDLE) ×2 IMPLANT
NS IRRIG 1000ML POUR BTL (IV SOLUTION) ×2 IMPLANT
PACK ARTHRO LIMB DRAPE STRL (MISCELLANEOUS) ×2 IMPLANT
PAD ABD 5X9 TENDERSORB (GAUZE/BANDAGES/DRESSINGS) ×2 IMPLANT
PAD ARMBOARD 7.5X6 YLW CONV (MISCELLANEOUS) ×2 IMPLANT
PAD COLD SHLDR SM WRAP-ON (PAD) ×1 IMPLANT
PAD FOR LEG HOLDER (MISCELLANEOUS) ×2 IMPLANT
PADDING CAST COTTON 6X4 STRL (CAST SUPPLIES) ×2 IMPLANT
SET ARTHROSCOPY INST (INSTRUMENTS) ×2 IMPLANT
SET BASIN LINEN APH (SET/KITS/TRAYS/PACK) ×2 IMPLANT
SUT ETHILON 3 0 FSL (SUTURE) ×2 IMPLANT
SYR 30ML LL (SYRINGE) ×2 IMPLANT
TUBE CONNECTING 12X1/4 (SUCTIONS) ×4 IMPLANT
TUBING IN/OUT FLOW W/MAIN PUMP (TUBING) ×2 IMPLANT

## 2021-09-19 NOTE — Anesthesia Procedure Notes (Signed)
Procedure Name: LMA Insertion Date/Time: 09/19/2021 7:34 AM  Performed by: Denese Killings, MDPre-anesthesia Checklist: Patient identified, Emergency Drugs available, Suction available and Patient being monitored Patient Re-evaluated:Patient Re-evaluated prior to induction Oxygen Delivery Method: Circle system utilized Preoxygenation: Pre-oxygenation with 100% oxygen Induction Type: IV induction LMA: LMA inserted LMA Size: 4.0 Number of attempts: 1 Dental Injury: Teeth and Oropharynx as per pre-operative assessment

## 2021-09-19 NOTE — Interval H&P Note (Signed)
History and Physical Interval Note:  09/19/2021 7:18 AM  Stacy Wolf  has presented today for surgery, with the diagnosis of right knee lateral meniscus tear and medial meniscus tear.  The various methods of treatment have been discussed with the patient and family. After consideration of risks, benefits and other options for treatment, the patient has consented to  Procedure(s): KNEE ARTHROSCOPY WITH LATERAL MENISCECTOMY AND MEDIAL MENISCECTOMY (Right) as a surgical intervention.  The patient's history has been reviewed, patient examined, no change in status, stable for surgery.  I have reviewed the patient's chart and labs.  Questions were answered to the patient's satisfaction.     Arther Abbott

## 2021-09-19 NOTE — Anesthesia Postprocedure Evaluation (Signed)
Anesthesia Post Note  Patient: Stacy Wolf  Procedure(s) Performed: KNEE ARTHROSCOPY WITH LATERAL MENISCECTOMY (Right: Knee)  Patient location during evaluation: Phase II Anesthesia Type: General Level of consciousness: awake and alert and oriented Pain management: pain level controlled Vital Signs Assessment: post-procedure vital signs reviewed and stable Respiratory status: spontaneous breathing, nonlabored ventilation and respiratory function stable Cardiovascular status: blood pressure returned to baseline and stable Postop Assessment: no apparent nausea or vomiting Anesthetic complications: no   No notable events documented.   Last Vitals:  Vitals:   09/19/21 0845 09/19/21 0923  BP: 119/72 125/82  Pulse:  (!) 57  Resp: 18 18  Temp: 36.6 C 36.6 C  SpO2: 99% 100%    Last Pain:  Vitals:   09/19/21 0923  TempSrc: Oral  PainSc: 8                  Shravan Salahuddin C Darriel Sinquefield

## 2021-09-19 NOTE — Anesthesia Preprocedure Evaluation (Signed)
Anesthesia Evaluation  Patient identified by MRN, date of birth, ID band Patient awake    Reviewed: Allergy & Precautions, NPO status , Patient's Chart, lab work & pertinent test results  Airway Mallampati: II  TM Distance: >3 FB Neck ROM: Full    Dental  (+) Chipped, Dental Advisory Given   Pulmonary neg pulmonary ROS, Current Smoker and Patient abstained from smoking.,    Pulmonary exam normal breath sounds clear to auscultation       Cardiovascular hypertension, Pt. on medications + Valvular Problems/Murmurs  Rhythm:Regular Rate:Normal + Systolic murmurs [id number] 15:36:58 [facility] ROUTINE RECORD February 27, 1976 (55 yr) Female Other Vent. rate 73 BPM PR interval 150 ms QRS duration 68 ms QT/QTcB 390/429 ms P-R-T axes 66 77 44 Normal sinus rhythm with sinus arrhythmia Normal ECG When compared with ECG of 07-Mar-2018 09:15, No significant change since last tracing PREVIOUS ECG IS PRESENT Confirmed by Asencion Noble [phone]) on 09/16/2021 6:58:22 AM   Neuro/Psych  Headaches, PSYCHIATRIC DISORDERS Anxiety Depression Bipolar Disorder Schizophrenia    GI/Hepatic GERD  Medicated,(+)     substance abuse  marijuana use,   Endo/Other  negative endocrine ROS  Renal/GU negative Renal ROS  negative genitourinary   Musculoskeletal negative musculoskeletal ROS (+)   Abdominal   Peds negative pediatric ROS (+)  Hematology negative hematology ROS (+)   Anesthesia Other Findings   Reproductive/Obstetrics negative OB ROS                             Anesthesia Physical Anesthesia Plan  ASA: 2  Anesthesia Plan: General   Post-op Pain Management: Dilaudid IV   Induction: Intravenous  PONV Risk Score and Plan: 3 and Ondansetron, Dexamethasone and Midazolam  Airway Management Planned: LMA  Additional Equipment:   Intra-op Plan:   Post-operative Plan: Extubation in  OR  Informed Consent: I have reviewed the patients History and Physical, chart, labs and discussed the procedure including the risks, benefits and alternatives for the proposed anesthesia with the patient or authorized representative who has indicated his/her understanding and acceptance.     Dental advisory given  Plan Discussed with: CRNA and Surgeon  Anesthesia Plan Comments:         Anesthesia Quick Evaluation

## 2021-09-19 NOTE — Brief Op Note (Signed)
09/19/2021  8:35 AM  PATIENT:  Stacy Wolf  46 y.o. female  PRE-OPERATIVE DIAGNOSIS:  right knee medial and lateral meniscus tear  POST-OPERATIVE DIAGNOSIS:  right knee lateral meniscus tear  PROCEDURE:  Procedure(s): KNEE ARTHROSCOPY WITH LATERAL MENISCECTOMY (Right)  Medial compartment: Mild fraying of articular cartilage chondromalacia grade 1, no evidence of medial meniscal tear  Lateral compartment mid body tear lateral meniscus cartilaginous surfaces normal  Trochlear normal patella normal  ACL and PCL normal  SURGEON:  Surgeon(s) and Role:    [name], MD - Primary  PHYSICIAN ASSISTANT:   ASSISTANTS: none   ANESTHESIA:   general  EBL:  20 mL   BLOOD ADMINISTERED:none  DRAINS: none   LOCAL MEDICATIONS USED:  MARCAINE     SPECIMEN:  No Specimen  DISPOSITION OF SPECIMEN:  N/A  COUNTS:  YES  TOURNIQUET:  * Missing tourniquet times found for documented tourniquets in log: [id number] *  DICTATION: .Viviann Spare Dictation  PLAN OF CARE: Discharge to home after PACU  PATIENT DISPOSITION:  PACU - hemodynamically stable.   Delay start of Pharmacological VTE agent (>24hrs) due to surgical blood loss or risk of bleeding: not applicable

## 2021-09-19 NOTE — Transfer of Care (Signed)
Immediate Anesthesia Transfer of Care Note  Patient: [name]  Procedure(s) Performed: KNEE ARTHROSCOPY WITH LATERAL MENISCECTOMY (Right: Knee)  Patient Location: PACU  Anesthesia Type:General  Level of Consciousness: awake, alert  and oriented  Airway & Oxygen Therapy: Patient Spontanous Breathing and Patient connected to nasal cannula oxygen  Post-op Assessment: Report given to RN and Post -op Vital signs reviewed and stable  Post vital signs: Reviewed and stable  Last Vitals:  Vitals Value Taken Time  BP 111/78 09/19/21 0843  Temp    Pulse 78   Resp 19 09/19/21 0843  SpO2 95%   Vitals shown include unvalidated device data.  Last Pain:  Vitals:   09/19/21 0629  TempSrc: Oral  PainSc: 0-No pain         Complications: No notable events documented.

## 2021-09-19 NOTE — Op Note (Signed)
09/19/2021  8:35 AM  PATIENT:  Stacy Wolf  46 y.o. female  PRE-OPERATIVE DIAGNOSIS:  right knee medial and lateral meniscus tear  POST-OPERATIVE DIAGNOSIS:  right knee lateral meniscus tear  PROCEDURE:  Procedure(s): KNEE ARTHROSCOPY WITH LATERAL MENISCECTOMY (Right)  Medial compartment: Mild fraying of articular cartilage chondromalacia grade 1, no evidence of medial meniscal tear  Lateral compartment mid body tear lateral meniscus cartilaginous surfaces normal  Trochlear normal patella normal  ACL and PCL normal  SURGEON:  Surgeon(s) and Role:    [name], MD - Primary  PHYSICIAN ASSISTANT:   ASSISTANTS: none   ANESTHESIA:   general  EBL:  20 mL   BLOOD ADMINISTERED:none  DRAINS: none   LOCAL MEDICATIONS USED:  MARCAINE     SPECIMEN:  No Specimen  DISPOSITION OF SPECIMEN:  N/A  COUNTS:  YES  TOURNIQUET:  * Missing tourniquet times found for documented tourniquets in log: [id number] *  DICTATION: .Viviann Spare Dictation  PLAN OF CARE: Discharge to home after PACU  PATIENT DISPOSITION:  PACU - hemodynamically stable.   Delay start of Pharmacological VTE agent (>24hrs) due to surgical blood loss or risk of bleeding: not applicable    The patient was identified in the preoperative holding area using 2 approved identification mechanisms. The chart was reviewed and updated. The surgical site was confirmed as right knee and marked with an indelible marker.  The patient was taken to the operating room for anesthesia. After successful general anesthesia, 2 g Ancef was used as IV antibiotics.  The patient was placed in the supine position with the (right) the operative extremity in an arthroscopic leg holder and the opposite extremity in a padded leg holder.  The timeout was executed.  A lateral portal was established with an 11 blade and the scope was introduced into the joint. A diagnostic arthroscopy was performed in circumferential manner examining  the entire knee joint. A medial portal was established and the diagnostic arthroscopy was repeated using a probe to palpate intra-articular structures as they were encountered.    The medial meniscus was evaluated and found to be intact.  There was some fraying of the cartilage of the medial femoral condyle which was debrided through medial portal with a shaver  The lateral meniscus was resected using a torpedo shaver.  A probe confirmed stable lateral rim   The arthroscopic pump was placed on the wash mode and any excess debris was removed from the joint using suction.  60 cc of Marcaine with epinephrine was injected through the arthroscope.  The portals were closed with 3-0 nylon suture.  A sterile bandage, Ace wrap and Cryo/Cuff was placed and the Cryo/Cuff was activated. The patient was taken to the recovery room in stable condition.   POST OP PLAN  WB as tolerated SUTURES OUT IN A WEEK  AROM  BRACE none

## 2021-09-20 ENCOUNTER — Encounter (HOSPITAL_COMMUNITY): Payer: Self-pay | Admitting: Orthopedic Surgery

## 2021-09-27 ENCOUNTER — Ambulatory Visit (INDEPENDENT_AMBULATORY_CARE_PROVIDER_SITE_OTHER): Payer: Medicaid Other | Admitting: Orthopedic Surgery

## 2021-09-27 ENCOUNTER — Encounter: Payer: Self-pay | Admitting: Orthopedic Surgery

## 2021-09-27 DIAGNOSIS — Z9889 Other specified postprocedural states: Secondary | ICD-10-CM

## 2021-09-27 NOTE — Patient Instructions (Signed)
Ice 3 x a day  Ibuprofen 800 mg 3 x a day   Tylenol 500 mg 4 x a day

## 2021-09-27 NOTE — Progress Notes (Signed)
FOLLOW UP   Encounter Diagnosis  Name Primary?   S/P right knee arthroscopy Yes     Chief Complaint  Patient presents with   Post-op Follow-up    09/19/21 right knee / c/o pain using ice      46 year old female status post knee arthroscopy right knee.  We did have to use a second medial portal to access her lateral meniscus completely  She had a lateral meniscus tear she had mild chondromalacia of the medial compartment patellofemoral joint normal and trochlea normal with normal ACL PCL  She comes in complaining of pain and swelling.  She does not really have a lot of swelling her incisions look fine we remove the sutures  Recommend she continue with ice ibuprofen and Tylenol for pain ambulate full weightbearing as tolerated start home exercise program follow-up 3 weeks

## 2021-09-28 ENCOUNTER — Ambulatory Visit (INDEPENDENT_AMBULATORY_CARE_PROVIDER_SITE_OTHER): Payer: Medicaid Other | Admitting: Orthopedic Surgery

## 2021-09-28 DIAGNOSIS — Z9889 Other specified postprocedural states: Secondary | ICD-10-CM

## 2021-09-28 NOTE — Progress Notes (Signed)
Chief Complaint  Patient presents with   Follow-up    Recheck on right knee, DOS 09-19-21    Patient Steri-Strip came off.  Wound is clean  I placed a new Steri-Strip on the portal and then gave her some to use if it comes off again

## 2021-10-18 ENCOUNTER — Encounter: Payer: Self-pay | Admitting: Orthopedic Surgery

## 2021-10-18 ENCOUNTER — Ambulatory Visit (INDEPENDENT_AMBULATORY_CARE_PROVIDER_SITE_OTHER): Payer: Medicaid Other | Admitting: Orthopedic Surgery

## 2021-10-18 DIAGNOSIS — Z9889 Other specified postprocedural states: Secondary | ICD-10-CM

## 2021-10-18 DIAGNOSIS — M23321 Other meniscus derangements, posterior horn of medial meniscus, right knee: Secondary | ICD-10-CM

## 2021-10-18 NOTE — Patient Instructions (Addendum)
Out of work 4 weeks [phone] is number to call for PT

## 2021-10-18 NOTE — Progress Notes (Signed)
Chief Complaint  Patient presents with   Follow-up    Recheck on right knee, DOS 09-19-21   Encounter Diagnoses  Name Primary?   S/P right knee arthroscopy Yes   Other meniscus derangements, posterior horn of medial meniscus, right knee    Stacy Wolf is postop from her knee arthroscopy she says she is doing well she only has 1 issue but it is a major issue.  She has to squat at work and she cannot squat  Her knee looks good she has minimal swelling she had good quadricep strength and excellent range of motion without pain she is walking without any support  Recommend physical therapy to address the quadriceps weakness and help her to squat  Follow-up 4 weeks  Out of work for 4 weeks.

## 2021-10-24 ENCOUNTER — Encounter: Payer: Self-pay | Admitting: Physical Therapy

## 2021-10-24 ENCOUNTER — Ambulatory Visit: Payer: Medicaid Other | Attending: Orthopedic Surgery | Admitting: Physical Therapy

## 2021-10-24 ENCOUNTER — Other Ambulatory Visit: Payer: Self-pay

## 2021-10-24 DIAGNOSIS — M25561 Pain in right knee: Secondary | ICD-10-CM | POA: Insufficient documentation

## 2021-10-24 DIAGNOSIS — M25661 Stiffness of right knee, not elsewhere classified: Secondary | ICD-10-CM | POA: Insufficient documentation

## 2021-10-24 DIAGNOSIS — Z9889 Other specified postprocedural states: Secondary | ICD-10-CM | POA: Insufficient documentation

## 2021-10-24 DIAGNOSIS — R600 Localized edema: Secondary | ICD-10-CM | POA: Insufficient documentation

## 2021-10-24 DIAGNOSIS — G8929 Other chronic pain: Secondary | ICD-10-CM | POA: Insufficient documentation

## 2021-10-24 NOTE — Therapy (Signed)
OUTPATIENT PHYSICAL THERAPY LOWER EXTREMITY EVALUATION   Patient Name: Stacy Wolf MRN: [id number] DOB:August 17, 1975, 46 y.o., female Today's Date: 10/24/2021   PT End of Session - 10/24/21 4008     Visit Number 1    Number of Visits 12    Date for PT Re-Evaluation 12/05/21    PT Start Time 0804    PT Stop Time 0829    PT Time Calculation (min) 25 min    Activity Tolerance Patient tolerated treatment well    Behavior During Therapy [facility] for tasks assessed/performed             Past Medical History:  Diagnosis Date   Anxiety    on meds   Bipolar 1 disorder ([place])    Blood transfusion without reported diagnosis 2000   Depression    on meds   GERD (gastroesophageal reflux disease)    on meds   Glaucoma    LEFT eye- not a surgical candidate at this time (02/06/2021)   Heart murmur    History of kidney stones    Hypertension    on meds   Migraines    Schizophrenia ([place])    Seasonal allergies    Past Surgical History:  Procedure Laterality Date   BILATERAL SALPINGECTOMY Bilateral 08/29/2016   Procedure: BILATERAL SALPINGECTOMY;  Surgeon: Florian Buff, MD;  Location: [facility];  Service: Gynecology;  Laterality: Bilateral;   CARPAL TUNNEL RELEASE Left 04/11/2016   Procedure: CARPAL TUNNEL RELEASE;  Surgeon: [name], MD;  Location: [facility];  Service: Orthopedics;  Laterality: Left;   CARPAL TUNNEL RELEASE Right 03/13/2018   Procedure: RIGHT CARPAL TUNNEL RELEASE;  Surgeon: [name], MD;  Location: [facility];  Service: Orthopedics;  Laterality: Right;   KNEE ARTHROSCOPY WITH LATERAL MENISECTOMY Right 09/19/2021   Procedure: KNEE ARTHROSCOPY WITH LATERAL MENISCECTOMY;  Surgeon: [name], MD;  Location: [facility];  Service: Orthopedics;  Laterality: Right;   SUPRACERVICAL ABDOMINAL HYSTERECTOMY N/A 08/29/2016   Procedure: HYSTERECTOMY SUPRACERVICAL ABDOMINAL;  Surgeon: Florian Buff, MD;  Location: [facility];  Service: Gynecology;  Laterality: N/A;    TRIGGER FINGER RELEASE Right 11/18/2018   Procedure: RELEASE TRIGGER FINGER/A-1 PULLEY right ring;  Surgeon: [name], MD;  Location: [facility];  Service: Orthopedics;  Laterality: Right;   TUBAL LIGATION     WISDOM TOOTH EXTRACTION     WRIST SURGERY Right 2006   tendonitis   Patient Active Problem List   Diagnosis Date Noted   Meniscus, lateral, derangement, right    Status post partial hysterectomy 08/19/2020   Current use of estrogen therapy 01/20/2019   Posttraumatic stress disorder 10/17/2016   Personality disorder ([place]) 10/17/2016   Menorrhagia with irregular cycle 08/13/2016   Obesity (BMI 30.0-34.9) 04/23/2016   Essential hypertension, benign 01/16/2016   Migraine headache 04/18/2015   Tobacco abuse 02/02/2015   Gastroesophageal reflux disease without esophagitis 08/24/2014   Hyperlipidemia LDL goal <100 09/14/2013   Bipolar disorder ([place]) 08/25/2013    REFERRING PROVIDER: Arther Abbott MD  REFERRING DIAG: S/p right knee arthroscopy.  THERAPY DIAG:  Chronic pain of right knee  Localized edema  Rationale for Evaluation and Treatment Rehabilitation  ONSET DATE: 09/19/21 (surgery date).  SUBJECTIVE:   SUBJECTIVE STATEMENT: The patient presents to the clinic s/p right knee arthroscopy performed on 09/19/21.  At rest her pain is low but increased activity cause her pain to rise to higher levels.  She continues to report some swelling and has been icing  approximately 3 times a day for about 15 minutes each session.  She attempted a deep squat recently but the pain was severe.  We discussed the need to strengthening but it needs to be done in a pain-free fashion.  Her CC is pain on the medial side of her right knee.  Her goal is to get back to work.     PERTINENT HISTORY: H/o right knee pain, HTN.  PAIN:  Are you having pain? Yes: NPRS scale: 2-3/10 Pain location: Right medial knee. Pain description: Ache. Aggravating factors: See above. Relieving factors:  Rest and ice.  PRECAUTIONS: Other: Pain-free there ex.  WEIGHT BEARING RESTRICTIONS No  FALLS:  Has patient fallen in last 6 months? Yes. Number of falls 3-4 prior to surgery.   No falls since surgery.  LIVING ENVIRONMENT: Lives in: House/apartment Has following equipment at home: None  OCCUPATION: Works at a [employer].  PLOF: Independent  PATIENT GOALS Get back to work.   OBJECTIVE:  EDEMA:  Circumferential: 2 cms at apex of patella measured circumferentially.  PALPATION: Normal right patellar mobility.  Tender to palpation in area of right medial scope site.  LOWER EXTREMITY ROM:  Full right knee AROM.  LOWER EXTREMITY MMT:  Right hip strength is normal.  Right quadriceps graded at 4 to 4+/5.  Patient easily able to perform a right antigravity SLR without extensor lag.  GAIT: Essentially normal this morning.  ASSESSMENT:  CLINICAL IMPRESSION: The patient presents to [facility] s/p right knee arthroscopic surgery performed on 09/19/21.  She is found to have full active right knee range of motion.  He CC is tenderness to palpation over her right knee medial joint line in the area of the scope site.  She has some remaining edema.  She demonstrates normal patellar mobility.  Increased activity continues to increase her pain.  She states she wants to get back to work.  Patient will benefit from skilled physical therapy intervention to address pain and deficits.   ACTIVITY LIMITATIONS squatting and locomotion level  PARTICIPATION LIMITATIONS: occupation  PERSONAL FACTORS 1 comorbidity: h/o right knee pain  are also affecting patient's functional outcome.   REHAB POTENTIAL: Excellent  CLINICAL DECISION MAKING: Stable/uncomplicated  EVALUATION COMPLEXITY: Low   GOALS:    LONG TERM GOALS: Target date: 12/05/2021   Ind with a HEP. Baseline: No knowledge of appropriate there ex. Goal status: INITIAL  2.  Walk a community distance with right knee pain not >  2/10. Baseline: Increased walking increases patient's knee pain. Goal status: INITIAL  3.  Perform ADL's with pain not > 2/10. Baseline: Increased activity increase patient's right knee pain. Goal status: INITIAL  4.  Solid right knee strength of 5/5 to increase stability for functional tasks. Baseline: Right quadriceps strength is graded at 4 to 4+/5. Goal status: INITIAL   PLAN: PT FREQUENCY: 2x/week  PT DURATION: 6 weeks  PLANNED INTERVENTIONS: Therapeutic exercises, Therapeutic activity, Neuromuscular re-education, Patient/Family education, Self Care, Electrical stimulation, Cryotherapy, Moist heat, Vasopneumatic device, Ultrasound, Ionotophoresis '4mg'$ /ml Dexamethasone, and Manual therapy  PLAN FOR NEXT SESSION: Recumbent bike, pain-free right knee there ex (O and CKC), modalities as needed.   Johnesha Acheampong, [place], PT 10/24/2021, 8:39 AM

## 2021-11-02 ENCOUNTER — Ambulatory Visit: Payer: Medicaid Other | Admitting: Physical Therapy

## 2021-11-02 ENCOUNTER — Encounter: Payer: Self-pay | Admitting: Physical Therapy

## 2021-11-02 DIAGNOSIS — R600 Localized edema: Secondary | ICD-10-CM

## 2021-11-02 DIAGNOSIS — G8929 Other chronic pain: Secondary | ICD-10-CM

## 2021-11-02 DIAGNOSIS — M25561 Pain in right knee: Secondary | ICD-10-CM | POA: Diagnosis not present

## 2021-11-02 DIAGNOSIS — M25661 Stiffness of right knee, not elsewhere classified: Secondary | ICD-10-CM

## 2021-11-02 NOTE — Therapy (Signed)
OUTPATIENT PHYSICAL THERAPY LOWER EXTREMITY TREATMENT   Patient Name: Stacy Wolf MRN: [id number] DOB:11-04-1975, 46 y.o., female Today's Date: 11/02/2021   PT End of Session - 11/02/21 0903     Visit Number 2    Number of Visits 12    Date for PT Re-Evaluation 12/05/21    PT Start Time 0902    PT Stop Time 0941    PT Time Calculation (min) 39 min    Activity Tolerance Patient tolerated treatment well    Behavior During Therapy [facility] for tasks assessed/performed             Past Medical History:  Diagnosis Date   Anxiety    on meds   Bipolar 1 disorder ([place])    Blood transfusion without reported diagnosis 2000   Depression    on meds   GERD (gastroesophageal reflux disease)    on meds   Glaucoma    LEFT eye- not a surgical candidate at this time (02/06/2021)   Heart murmur    History of kidney stones    Hypertension    on meds   Migraines    Schizophrenia ([place])    Seasonal allergies    Past Surgical History:  Procedure Laterality Date   BILATERAL SALPINGECTOMY Bilateral 08/29/2016   Procedure: BILATERAL SALPINGECTOMY;  Surgeon: Florian Buff, MD;  Location: [facility];  Service: Gynecology;  Laterality: Bilateral;   CARPAL TUNNEL RELEASE Left 04/11/2016   Procedure: CARPAL TUNNEL RELEASE;  Surgeon: [name], MD;  Location: [facility];  Service: Orthopedics;  Laterality: Left;   CARPAL TUNNEL RELEASE Right 03/13/2018   Procedure: RIGHT CARPAL TUNNEL RELEASE;  Surgeon: [name], MD;  Location: [facility];  Service: Orthopedics;  Laterality: Right;   KNEE ARTHROSCOPY WITH LATERAL MENISECTOMY Right 09/19/2021   Procedure: KNEE ARTHROSCOPY WITH LATERAL MENISCECTOMY;  Surgeon: [name], MD;  Location: [facility];  Service: Orthopedics;  Laterality: Right;   SUPRACERVICAL ABDOMINAL HYSTERECTOMY N/A 08/29/2016   Procedure: HYSTERECTOMY SUPRACERVICAL ABDOMINAL;  Surgeon: Florian Buff, MD;  Location: [facility];  Service: Gynecology;  Laterality: N/A;    TRIGGER FINGER RELEASE Right 11/18/2018   Procedure: RELEASE TRIGGER FINGER/A-1 PULLEY right ring;  Surgeon: [name], MD;  Location: [facility];  Service: Orthopedics;  Laterality: Right;   TUBAL LIGATION     WISDOM TOOTH EXTRACTION     WRIST SURGERY Right 2006   tendonitis   Patient Active Problem List   Diagnosis Date Noted   Meniscus, lateral, derangement, right    Status post partial hysterectomy 08/19/2020   Current use of estrogen therapy 01/20/2019   Posttraumatic stress disorder 10/17/2016   Personality disorder ([place]) 10/17/2016   Menorrhagia with irregular cycle 08/13/2016   Obesity (BMI 30.0-34.9) 04/23/2016   Essential hypertension, benign 01/16/2016   Migraine headache 04/18/2015   Tobacco abuse 02/02/2015   Gastroesophageal reflux disease without esophagitis 08/24/2014   Hyperlipidemia LDL goal <100 09/14/2013   Bipolar disorder ([place]) 08/25/2013    REFERRING PROVIDER: Arther Abbott MD  REFERRING DIAG: S/p right knee arthroscopy.  THERAPY DIAG:  Chronic pain of right knee  Localized edema  Stiffness of right knee, not elsewhere classified  Rationale for Evaluation and Treatment Rehabilitation  ONSET DATE: 09/19/21 (surgery date).  SUBJECTIVE:   SUBJECTIVE STATEMENT: Reports soreness.  Most difficulty with squatting to full range.  PERTINENT HISTORY: H/o right knee pain, HTN.  PAIN:  Are you having pain? Yes: NPRS scale: 6/10 Pain location: Right medial knee.  Pain description: Ache, sore Aggravating factors: See above. Relieving factors: Rest and ice.  PRECAUTIONS: Other: Pain-free there ex.  WEIGHT BEARING RESTRICTIONS No   OBJECTIVE:                                     EXERCISE LOG  Exercise Repetitions and Resistance Comments  Nustep L3 x17 min   Heel/toe raises  X20 reps   Sit <> stand No UE x20 reps to plinth   LAQ X15 reps 5 sec   SLR X15 reps   Bridge X20 reps 5 sec holds   SL hip abduction X15 reps   SLR with ER X15  reps   R lateral heel dot 4" step x20 reps   R step down  4" step x15 reps    Blank cell = exercise not performed today   ASSESSMENT:  CLINICAL IMPRESSION: Patient presented in clinic with reports of moderate medial R knee ache and soreness. Patient guided through light painfree therex with ankle DF to reinforce quad activation. Patient VCd to avoid negetive compensatory strategies. No complaints with any therex today. Patient reports being compliant with icing at home and use of ibuprofen.   ACTIVITY LIMITATIONS squatting and locomotion level  PARTICIPATION LIMITATIONS: occupation  PERSONAL FACTORS 1 comorbidity: h/o right knee pain  are also affecting patient's functional outcome.   REHAB POTENTIAL: Excellent  CLINICAL DECISION MAKING: Stable/uncomplicated  EVALUATION COMPLEXITY: Low   GOALS:    LONG TERM GOALS: Target date: 12/14/2021   Ind with a HEP. Baseline: No knowledge of appropriate there ex. Goal status: INITIAL  2.  Walk a community distance with right knee pain not > 2/10. Baseline: Increased walking increases patient's knee pain. Goal status: INITIAL  3.  Perform ADL's with pain not > 2/10. Baseline: Increased activity increase patient's right knee pain. Goal status: INITIAL  4.  Solid right knee strength of 5/5 to increase stability for functional tasks. Baseline: Right quadriceps strength is graded at 4 to 4+/5. Goal status: INITIAL   PLAN: PT FREQUENCY: 2x/week  PT DURATION: 6 weeks  PLANNED INTERVENTIONS: Therapeutic exercises, Therapeutic activity, Neuromuscular re-education, Patient/Family education, Self Care, Electrical stimulation, Cryotherapy, Moist heat, Vasopneumatic device, Ultrasound, Ionotophoresis '4mg'$ /ml Dexamethasone, and Manual therapy  PLAN FOR NEXT SESSION: Recumbent bike, pain-free right knee there ex (O and CKC), modalities as needed.   Standley Brooking, PTA 11/02/2021, 9:41 AM

## 2021-11-08 ENCOUNTER — Ambulatory Visit: Payer: Medicaid Other | Admitting: Family Medicine

## 2021-11-08 ENCOUNTER — Other Ambulatory Visit: Payer: Self-pay | Admitting: Family Medicine

## 2021-11-08 ENCOUNTER — Other Ambulatory Visit (HOSPITAL_COMMUNITY)
Admission: RE | Admit: 2021-11-08 | Discharge: 2021-11-08 | Disposition: A | Discharge status: Home / Self Care | Payer: Medicaid Other | Source: Ambulatory Visit | Attending: Family Medicine | Admitting: Family Medicine

## 2021-11-08 VITALS — BP 114/79 | HR 63 | Temp 97.7°F | Resp 20 | Ht 63.0 in | Wt 177.0 lb

## 2021-11-08 DIAGNOSIS — N898 Other specified noninflammatory disorders of vagina: Secondary | ICD-10-CM | POA: Insufficient documentation

## 2021-11-08 DIAGNOSIS — N760 Acute vaginitis: Secondary | ICD-10-CM

## 2021-11-08 DIAGNOSIS — B9689 Other specified bacterial agents as the cause of diseases classified elsewhere: Secondary | ICD-10-CM

## 2021-11-08 DIAGNOSIS — Z1231 Encounter for screening mammogram for malignant neoplasm of breast: Secondary | ICD-10-CM

## 2021-11-08 LAB — WET PREP FOR TRICH, YEAST, CLUE
Clue Cell Exam: POSITIVE — AB
Trichomonas Exam: NEGATIVE
Yeast Exam: NEGATIVE

## 2021-11-08 MED ORDER — METRONIDAZOLE 500 MG PO TABS: 500.0000 mg | ORAL_TABLET | Freq: Two times a day (BID) | ORAL | 0 refills | Status: DC

## 2021-11-08 NOTE — Progress Notes (Signed)
Subjective: ME:QASTMHD irritation PCP: Dettinger, Fransisca Kaufmann, MD QQI:[id number] Stacy Wolf is a 46 y.o. female presenting to clinic today for:  1. Vaginal discharge Patient reports a 5-day history of vaginal discharge.  She notes that it started out moderate amounts and she used Monistat for 3 days.  After she discontinued she still had some discharge but it seemed to be a bit thinner and less.  She denies any fishy odors but notes that she smells off.  The discharge is thin and yellow.  She has not been on any recent antibiotics.  Denies any pelvic pain including postcoital bleeding or dyspareunia.  She is in a monogamous relationship with a female partner of 11 years.  Uncertain of any infidelity.  She has a personal history of HSV but denies any vaginal lesions or treatment for this previously.  They do not use condoms for protection.  Denies any new lubricants or other products.  Last unprotected intercourse was 2 days ago.   ROS: Per HPI  Allergies  Allergen Reactions   Imitrex [Sumatriptan] Anaphylaxis and Other (See Comments)    Could not swallow after taking med   Latuda [Lurasidone Hcl] Other (See Comments)    Suicidal thoughts    Wellbutrin [Bupropion] Other (See Comments)    Seizures / lowered seizure threshold   Zoloft [Sertraline Hcl] Hives   Xanax [Alprazolam] Other (See Comments)    Passed out   Haloperidol And Related Other (See Comments)    Leg cramps   Nicoderm [Nicotine] Dermatitis   Past Medical History:  Diagnosis Date   Anxiety    on meds   Bipolar 1 disorder ([place])    Blood transfusion without reported diagnosis 2000   Depression    on meds   GERD (gastroesophageal reflux disease)    on meds   Glaucoma    LEFT eye- not a surgical candidate at this time (02/06/2021)   Heart murmur    History of kidney stones    Hypertension    on meds   Migraines    Schizophrenia ([place])    Seasonal allergies     Current Outpatient Medications:    amLODipine (NORVASC) 5 MG  tablet, TAKE 1 TABLET (5 MG TOTAL) BY MOUTH DAILY., Disp: 90 tablet, Rfl: 0   aspirin-acetaminophen-caffeine (EXCEDRIN MIGRAINE) 250-250-65 MG tablet, Take 2 tablets by mouth every 6 (six) hours as needed for headache or migraine. , Disp: , Rfl:    estradiol (ESTRACE) 2 MG tablet, Take 1 tablet (2 mg total) by mouth at bedtime., Disp: 90 tablet, Rfl: 4   fluticasone (FLONASE) 50 MCG/ACT nasal spray, Place 1 spray into both nostrils 2 (two) times daily as needed for allergies or rhinitis., Disp: 16 g, Rfl: 6   ibuprofen (ADVIL) 800 MG tablet, Take 1 tablet (800 mg total) by mouth every 8 (eight) hours as needed., Disp: 90 tablet, Rfl: 5   lisinopril (ZESTRIL) 5 MG tablet, TAKE 1 TABLET (5 MG TOTAL) BY MOUTH DAILY., Disp: 90 tablet, Rfl: 0   pantoprazole (PROTONIX) 20 MG tablet, Take 20 mg by mouth daily., Disp: , Rfl:  Social History   Socioeconomic History   Marital status: Divorced    Spouse name: Not on file   Number of children: 3   Years of education: Not on file   Highest education level: 11th grade  Occupational History   Not on file  Tobacco Use   Smoking status: Every Day    Packs/day: 2.00    Years: 7.00  Total pack years: 14.00    Types: Cigars, Cigarettes   Smokeless tobacco: Never   Tobacco comments:    1 cigar per day  Vaping Use   Vaping Use: Never used  Substance and Sexual Activity   Alcohol use: Not Currently    Alcohol/week: 0.0 - 1.0 standard drinks of alcohol    Comment: occasionally   Drug use: Yes    Frequency: 4.0 times per week    Types: Marijuana    Comment: 09/15/21   Sexual activity: Yes    Birth control/protection: Surgical    Comment: supracervical hyst  Other Topics Concern   Not on file  Social History Narrative   Not on file   Social Determinants of Health   Financial Resource Strain: Low Risk  (04/25/2017)   Overall Financial Resource Strain (CARDIA)    Difficulty of Paying Living Expenses: Not hard at all  Food Insecurity: No Food  Insecurity (04/25/2017)   Hunger Vital Sign    Worried About Running Out of Food in the Last Year: Never true    Ran Out of Food in the Last Year: Never true  Transportation Needs: No Transportation Needs (04/25/2017)   PRAPARE - Hydrologist (Medical): No    Lack of Transportation (Non-Medical): No  Physical Activity: Inactive (04/25/2017)   Exercise Vital Sign    Days of Exercise per Week: 0 days    Minutes of Exercise per Session: 0 min  Stress: No Stress Concern Present (04/25/2017)   [place]    Feeling of Stress : Not at all  Social Connections: Moderately Isolated (04/25/2017)   Social Connection and Isolation Panel [NHANES]    Frequency of Communication with Friends and Family: More than three times a week    Frequency of Social Gatherings with Friends and Family: Three times a week    Attends Religious Services: Never    Active Member of Clubs or Organizations: No    Attends Archivist Meetings: Never    Marital Status: Divorced  Human resources officer Violence: Not At Risk (04/25/2017)   Humiliation, Afraid, Rape, and Kick questionnaire    Fear of Current or Ex-Partner: No    Emotionally Abused: No    Physically Abused: No    Sexually Abused: No   Family History  Problem Relation Age of Onset   Breast cancer Mother    Cancer Mother 37       breast CA at 67 and uterine CA at 64   Hypertension Mother    Kidney disease Mother        kidney transplant   Diabetes Father    Liver disease Father    Cancer Sister 29       uterine   Cancer Sister 71       uterine   Breast cancer Maternal Aunt    Cancer Maternal Aunt        breast   Colon polyps Neg Hx    Colon cancer Neg Hx    Esophageal cancer Neg Hx    Rectal cancer Neg Hx    Stomach cancer Neg Hx     Objective: Office vital signs reviewed. BP 114/79   Pulse 63   Temp 97.7 F (36.5 C) (Temporal)   Resp 20   Ht  '5\' 3"'$  (1.6 m)   Wt 177 lb (80.3 kg)   LMP 08/10/2016 Comment: [place]  SpO2 96%   BMI 31.35  kg/m   Physical Examination:  General: Awake, alert, nontoxic female, No acute distress HEENT:sclera white GU: external vaginal tissue normal, cervix midline, no punctate lesions on cervix appreciated, moderate creamy discharge from cervical os, no bleeding, no cervical motion tenderness, no abdominal/ adnexal masses  Assessment/ Plan: 46 y.o. female   Vaginal discharge - Plan: [place], YEAST, CLUE, Cervicovaginal ancillary only  Bacterial vaginosis - Plan: metroNIDAZOLE (FLAGYL) 500 MG tablet  Positive for bacterial vaginosis.  We will send in Flagyl.  Will await GC chlamydia testing and contact patient once available  Orders Placed This Encounter  Procedures   WET PREP FOR [place]   No orders of the defined types were placed in this encounter.    Janora Norlander, DO [place] [phone]

## 2021-11-09 ENCOUNTER — Ambulatory Visit: Payer: Medicaid Other | Attending: Orthopedic Surgery | Admitting: Physical Therapy

## 2021-11-09 ENCOUNTER — Encounter: Payer: Self-pay | Admitting: Physical Therapy

## 2021-11-09 DIAGNOSIS — M25561 Pain in right knee: Secondary | ICD-10-CM | POA: Diagnosis present

## 2021-11-09 DIAGNOSIS — R600 Localized edema: Secondary | ICD-10-CM | POA: Diagnosis present

## 2021-11-09 DIAGNOSIS — G8929 Other chronic pain: Secondary | ICD-10-CM | POA: Diagnosis present

## 2021-11-09 DIAGNOSIS — M25661 Stiffness of right knee, not elsewhere classified: Secondary | ICD-10-CM | POA: Diagnosis present

## 2021-11-09 NOTE — Therapy (Signed)
OUTPATIENT PHYSICAL THERAPY LOWER EXTREMITY TREATMENT   Patient Name: Stacy Wolf MRN: [id number] DOB:February 27, 1976, 46 y.o., female Today's Date: 11/09/2021   PT End of Session - 11/09/21 0947     Visit Number 3    Number of Visits 12    Date for PT Re-Evaluation 12/05/21    PT Start Time 0900    PT Stop Time 0947    PT Time Calculation (min) 47 min    Activity Tolerance Patient tolerated treatment well    Behavior During Therapy [facility] for tasks assessed/performed              Past Medical History:  Diagnosis Date   Anxiety    on meds   Bipolar 1 disorder ([place])    Blood transfusion without reported diagnosis 2000   Depression    on meds   GERD (gastroesophageal reflux disease)    on meds   Glaucoma    LEFT eye- not a surgical candidate at this time (02/06/2021)   Heart murmur    History of kidney stones    Hypertension    on meds   Migraines    Schizophrenia ([place])    Seasonal allergies    Past Surgical History:  Procedure Laterality Date   BILATERAL SALPINGECTOMY Bilateral 08/29/2016   Procedure: BILATERAL SALPINGECTOMY;  Surgeon: Florian Buff, MD;  Location: [facility];  Service: Gynecology;  Laterality: Bilateral;   CARPAL TUNNEL RELEASE Left 04/11/2016   Procedure: CARPAL TUNNEL RELEASE;  Surgeon: [name], MD;  Location: [facility];  Service: Orthopedics;  Laterality: Left;   CARPAL TUNNEL RELEASE Right 03/13/2018   Procedure: RIGHT CARPAL TUNNEL RELEASE;  Surgeon: [name], MD;  Location: [facility];  Service: Orthopedics;  Laterality: Right;   KNEE ARTHROSCOPY WITH LATERAL MENISECTOMY Right 09/19/2021   Procedure: KNEE ARTHROSCOPY WITH LATERAL MENISCECTOMY;  Surgeon: [name], MD;  Location: [facility];  Service: Orthopedics;  Laterality: Right;   SUPRACERVICAL ABDOMINAL HYSTERECTOMY N/A 08/29/2016   Procedure: HYSTERECTOMY SUPRACERVICAL ABDOMINAL;  Surgeon: Florian Buff, MD;  Location: [facility];  Service: Gynecology;  Laterality: N/A;    TRIGGER FINGER RELEASE Right 11/18/2018   Procedure: RELEASE TRIGGER FINGER/A-1 PULLEY right ring;  Surgeon: [name], MD;  Location: [facility];  Service: Orthopedics;  Laterality: Right;   TUBAL LIGATION     WISDOM TOOTH EXTRACTION     WRIST SURGERY Right 2006   tendonitis   Patient Active Problem List   Diagnosis Date Noted   Meniscus, lateral, derangement, right    Status post partial hysterectomy 08/19/2020   Current use of estrogen therapy 01/20/2019   Posttraumatic stress disorder 10/17/2016   Personality disorder ([place]) 10/17/2016   Menorrhagia with irregular cycle 08/13/2016   Obesity (BMI 30.0-34.9) 04/23/2016   Essential hypertension, benign 01/16/2016   Migraine headache 04/18/2015   Tobacco abuse 02/02/2015   Gastroesophageal reflux disease without esophagitis 08/24/2014   Hyperlipidemia LDL goal <100 09/14/2013   Bipolar disorder ([place]) 08/25/2013    REFERRING PROVIDER: Arther Abbott MD  REFERRING DIAG: S/p right knee arthroscopy.  THERAPY DIAG:  Chronic pain of right knee  Localized edema  Stiffness of right knee, not elsewhere classified  Rationale for Evaluation and Treatment Rehabilitation  ONSET DATE: 09/19/21 (surgery date).  SUBJECTIVE:   SUBJECTIVE STATEMENT: Doing whatever she needs at home and feels really good. Takes Ibuprofen as precaution.  PERTINENT HISTORY: H/o right knee pain, HTN.  PAIN:  Are you having pain? No  PRECAUTIONS: Other:  Pain-free there ex.  WEIGHT BEARING RESTRICTIONS No   OBJECTIVE:                                     EXERCISE LOG  Exercise Repetitions and Resistance Comments  Nustep L3 x15 min   Heel/toe raises  X20 reps   LAQ X15 reps 5 sec 4#   Hip abduction BLE x20 reps   R lateral heel dot 4" step x20 reps   R step down  4" step x20 reps   R forward step up  6" step x20 reps   Wall squat X20 reps    Blank cell = exercise not performed today   HOME EXERCISE  PROGRAM: [id number]  ASSESSMENT:  CLINICAL IMPRESSION: Patient presented in clinic with reports of no pain upon arrival. Patient able to go out walking in stores and no complaints from home. Patient is still icing R knee as directed by surgeon. Patient able to complete all therex as directed by PTA without complaint of pain. Patient reports that she does experience R knee buckling intermittantly with ambulation. Patient provided new HEP for quad strengthening and educated regarding parameters and technique.    ACTIVITY LIMITATIONS squatting and locomotion level  PARTICIPATION LIMITATIONS: occupation  PERSONAL FACTORS 1 comorbidity: h/o right knee pain  are also affecting patient's functional outcome.   REHAB POTENTIAL: Excellent  CLINICAL DECISION MAKING: Stable/uncomplicated  EVALUATION COMPLEXITY: Low   GOALS:    LONG TERM GOALS: Target date: 12/21/2021   Ind with a HEP. Baseline: No knowledge of appropriate there ex. Goal status: INITIAL  2.  Walk a community distance with right knee pain not > 2/10. Baseline: Increased walking increases patient's knee pain. Goal status: INITIAL  3.  Perform ADL's with pain not > 2/10. Baseline: Increased activity increase patient's right knee pain. Goal status: INITIAL  4.  Solid right knee strength of 5/5 to increase stability for functional tasks. Baseline: Right quadriceps strength is graded at 4 to 4+/5. Goal status: INITIAL   PLAN: PT FREQUENCY: 2x/week  PT DURATION: 6 weeks  PLANNED INTERVENTIONS: Therapeutic exercises, Therapeutic activity, Neuromuscular re-education, Patient/Family education, Self Care, Electrical stimulation, Cryotherapy, Moist heat, Vasopneumatic device, Ultrasound, Ionotophoresis '4mg'$ /ml Dexamethasone, and Manual therapy  PLAN FOR NEXT SESSION: Recumbent bike, pain-free right knee there ex (O and CKC), modalities as needed.   Standley Brooking, PTA 11/09/2021, 10:46 AM

## 2021-11-13 ENCOUNTER — Ambulatory Visit: Payer: Medicaid Other

## 2021-11-13 DIAGNOSIS — M25561 Pain in right knee: Secondary | ICD-10-CM | POA: Diagnosis not present

## 2021-11-13 DIAGNOSIS — G8929 Other chronic pain: Secondary | ICD-10-CM

## 2021-11-13 DIAGNOSIS — R600 Localized edema: Secondary | ICD-10-CM

## 2021-11-13 DIAGNOSIS — M25661 Stiffness of right knee, not elsewhere classified: Secondary | ICD-10-CM

## 2021-11-13 LAB — CERVICOVAGINAL ANCILLARY ONLY
Chlamydia: NEGATIVE
Comment: NEGATIVE
Comment: NORMAL
Neisseria Gonorrhea: NEGATIVE

## 2021-11-13 NOTE — Therapy (Signed)
OUTPATIENT PHYSICAL THERAPY LOWER EXTREMITY TREATMENT   Patient Name: Stacy Wolf MRN: [id number] DOB:19-Aug-1975, 46 y.o., female Today's Date: 11/13/2021   PT End of Session - 11/13/21 0904     Visit Number 4    Number of Visits 12    Date for PT Re-Evaluation 12/05/21    PT Start Time 0900    PT Stop Time 0944    PT Time Calculation (min) 44 min    Activity Tolerance Patient tolerated treatment well    Behavior During Therapy [facility] for tasks assessed/performed              Past Medical History:  Diagnosis Date   Anxiety    on meds   Bipolar 1 disorder ([place])    Blood transfusion without reported diagnosis 2000   Depression    on meds   GERD (gastroesophageal reflux disease)    on meds   Glaucoma    LEFT eye- not a surgical candidate at this time (02/06/2021)   Heart murmur    History of kidney stones    Hypertension    on meds   Migraines    Schizophrenia ([place])    Seasonal allergies    Past Surgical History:  Procedure Laterality Date   BILATERAL SALPINGECTOMY Bilateral 08/29/2016   Procedure: BILATERAL SALPINGECTOMY;  Surgeon: Florian Buff, MD;  Location[facility];  Service: Gynecology;  Laterality: Bilateral;   CARPAL TUNNEL RELEASE Left 04/11/2016   Procedure: CARPAL TUNNEL RELEASE;  Surgeon: [name], MD;  Location: [facility];  Service: Orthopedics;  Laterality: Left;   CARPAL TUNNEL RELEASE Right 03/13/2018   Procedure: RIGHT CARPAL TUNNEL RELEASE;  Surgeon: [name], MD;  Location: [facility];  Service: Orthopedics;  Laterality: Right;   KNEE ARTHROSCOPY WITH LATERAL MENISECTOMY Right 09/19/2021   Procedure: KNEE ARTHROSCOPY WITH LATERAL MENISCECTOMY;  Surgeon: [name], MD;  Location[facility];  Service: Orthopedics;  Laterality: Right;   SUPRACERVICAL ABDOMINAL HYSTERECTOMY N/A 08/29/2016   Procedure: HYSTERECTOMY SUPRACERVICAL ABDOMINAL;  Surgeon: Florian Buff, MD;  Location: [facility];  Service: Gynecology;  Laterality: N/A;    TRIGGER FINGER RELEASE Right 11/18/2018   Procedure: RELEASE TRIGGER FINGER/A-1 PULLEY right ring;  Surgeon: [name], MD;  Location: [facility];  Service: Orthopedics;  Laterality: Right;   TUBAL LIGATION     WISDOM TOOTH EXTRACTION     WRIST SURGERY Right 2006   tendonitis   Patient Active Problem List   Diagnosis Date Noted   Meniscus, lateral, derangement, right    Status post partial hysterectomy 08/19/2020   Current use of estrogen therapy 01/20/2019   Posttraumatic stress disorder 10/17/2016   Personality disorder ([place]) 10/17/2016   Menorrhagia with irregular cycle 08/13/2016   Obesity (BMI 30.0-34.9) 04/23/2016   Essential hypertension, benign 01/16/2016   Migraine headache 04/18/2015   Tobacco abuse 02/02/2015   Gastroesophageal reflux disease without esophagitis 08/24/2014   Hyperlipidemia LDL goal <100 09/14/2013   Bipolar disorder ([place]) 08/25/2013    REFERRING PROVIDER: Arther Abbott MD  REFERRING DIAG: S/p right knee arthroscopy.  THERAPY DIAG:  Chronic pain of right knee  Localized edema  Stiffness of right knee, not elsewhere classified  Rationale for Evaluation and Treatment Rehabilitation  ONSET DATE: 09/19/21 (surgery date).  SUBJECTIVE:   SUBJECTIVE STATEMENT: Pt reports feeling good today without pain.   PERTINENT HISTORY: H/o right knee pain, HTN.  PAIN:  Are you having pain? No  PRECAUTIONS: Other: Pain-free there ex.  WEIGHT BEARING  RESTRICTIONS No   OBJECTIVE:                                     EXERCISE LOG  Exercise Repetitions and Resistance Comments  Nustep L4 x15 min   Heel/toe raises  X25 reps   LAQ X20 reps 5 sec 5#   Hip abduction BLE x20 reps   Standing Marches BLE x20 reps   Lateral Step up 6" step x20 reps   R step down     Forward step up  6" step x20 reps, BLE   Wall squat X25 reps   SLS 30 sec x 2 reps   Squats on BOSU Ball side down x 20 reps   STS X 20 reps    Blank cell = exercise not performed  today   HOME EXERCISE PROGRAM: [id number]  ASSESSMENT:  CLINICAL IMPRESSION: Pt arrives for today's treatment session denying any pain.  Pt reports that she is going back to work on Thursday.  Pt able to tolerate increased reps and weight with various exercises today.  Pt introduced to functional squats on BOSU ball without issue.  Pt requiring min cues for proper technique and squat depth.  Pt challenged by SLS activities today.  Pt denied any pain at completion of today's treatment session.   ACTIVITY LIMITATIONS squatting and locomotion level  PARTICIPATION LIMITATIONS: occupation  PERSONAL FACTORS 1 comorbidity: h/o right knee pain  are also affecting patient's functional outcome.   REHAB POTENTIAL: Excellent  CLINICAL DECISION MAKING: Stable/uncomplicated  EVALUATION COMPLEXITY: Low   GOALS:    LONG TERM GOALS: Target date: 12/25/2021   Ind with a HEP. Baseline: No knowledge of appropriate there ex. Goal status: MET  2.  Walk a community distance with right knee pain not > 2/10. Baseline: Increased walking increases patient's knee pain. Goal status: MET  3.  Perform ADL's with pain not > 2/10. Baseline: Increased activity increase patient's right knee pain. Goal status: MET     4.  Solid right knee strength of 5/5 to increase stability for functional tasks. Baseline: Right quadriceps strength is graded at 4 to 4+/5. Goal status: INITIAL   PLAN: PT FREQUENCY: 2x/week  PT DURATION: 6 weeks  PLANNED INTERVENTIONS: Therapeutic exercises, Therapeutic activity, Neuromuscular re-education, Patient/Family education, Self Care, Electrical stimulation, Cryotherapy, Moist heat, Vasopneumatic device, Ultrasound, Ionotophoresis 82m/ml Dexamethasone, and Manual therapy  PLAN FOR NEXT SESSION: Recumbent bike, pain-free right knee there ex (O and CKC), modalities as needed.   JKathrynn Ducking PTA 11/13/2021, 9:54 AM

## 2021-11-14 ENCOUNTER — Telehealth: Payer: Self-pay | Admitting: Family Medicine

## 2021-11-14 DIAGNOSIS — B3731 Acute candidiasis of vulva and vagina: Secondary | ICD-10-CM

## 2021-11-14 MED ORDER — FLUCONAZOLE 150 MG PO TABS: 150.0000 mg | ORAL_TABLET | Freq: Once | ORAL | 0 refills | Status: AC

## 2021-11-14 NOTE — Telephone Encounter (Signed)
done

## 2021-11-14 NOTE — Telephone Encounter (Signed)
Meds ordered this encounter  Medications   fluconazole (DIFLUCAN) 150 MG tablet    Sig: Take 1 tablet (150 mg total) by mouth once for 1 dose.    Dispense:  1 tablet    Refill:  0

## 2021-11-15 ENCOUNTER — Telehealth: Payer: Self-pay | Admitting: Orthopedic Surgery

## 2021-11-15 ENCOUNTER — Ambulatory Visit (INDEPENDENT_AMBULATORY_CARE_PROVIDER_SITE_OTHER): Payer: Medicaid Other | Admitting: Orthopedic Surgery

## 2021-11-15 ENCOUNTER — Encounter: Payer: Self-pay | Admitting: Orthopedic Surgery

## 2021-11-15 DIAGNOSIS — Z9889 Other specified postprocedural states: Secondary | ICD-10-CM

## 2021-11-15 DIAGNOSIS — M5442 Lumbago with sciatica, left side: Secondary | ICD-10-CM

## 2021-11-15 MED ORDER — GABAPENTIN 100 MG PO CAPS: 100.0000 mg | ORAL_CAPSULE | Freq: Three times a day (TID) | ORAL | 2 refills | Status: DC

## 2021-11-15 MED ORDER — PREDNISONE 10 MG (48) PO TBPK: ORAL_TABLET | Freq: Every day | ORAL | 0 refills | Status: DC

## 2021-11-15 MED ORDER — METHOCARBAMOL 500 MG PO TABS: 500.0000 mg | ORAL_TABLET | Freq: Three times a day (TID) | ORAL | 1 refills | Status: DC

## 2021-11-15 NOTE — Progress Notes (Signed)
FOLLOW UP   Encounter Diagnosis  Name Primary?   S/P right knee arthroscopy Yes     Chief Complaint  Patient presents with   Knee Pain    DOS 09/19/21//R it is doing good, no issues at time.     RTW 11/16/21  Addendum patient says she had a history of sciatica on her left side and it started bothering her this weekend.  She complains of pain in the lower back with pain running down her left leg just below the knee  Told her that she may have some difficulties at work if she does but she says she has to pay the bills so I put her on the following  Meds ordered this encounter  Medications   predniSONE (STERAPRED UNI-PAK 48 TAB) 10 MG (48) TBPK tablet    Sig: Take by mouth daily. 10 mg ds 12 days    Dispense:  48 tablet    Refill:  0   methocarbamol (ROBAXIN) 500 MG tablet    Sig: Take 1 tablet (500 mg total) by mouth 3 (three) times daily.    Dispense:  60 tablet    Refill:  1   gabapentin (NEURONTIN) 100 MG capsule    Sig: Take 1 capsule (100 mg total) by mouth 3 (three) times daily.    Dispense:  90 capsule    Refill:  2

## 2021-11-15 NOTE — Patient Instructions (Addendum)
RETURN TO WORK 11/16/21   Smoking Tobacco Information, Adult Smoking tobacco can be harmful to your health. Tobacco contains a toxic colorless chemical called nicotine. Nicotine causes changes in your brain that make you want more and more. This is called addiction. This can make it hard to stop smoking once you start. Tobacco also has other toxic chemicals that can hurt your body and raise your risk of many cancers. Menthol or "lite" tobacco or cigarette brands are not safer than regular brands. How can smoking tobacco affect me? Smoking tobacco puts you at risk for: Cancer. Smoking is most commonly associated with lung cancer, but can also lead to cancer in other parts of the body. Chronic obstructive pulmonary disease (COPD). This is a long-term lung condition that makes it hard to breathe. It also gets worse over time. High blood pressure (hypertension), heart disease, stroke, heart attack, and lung infections, such as pneumonia. Cataracts. This is when the lenses in the eyes become clouded. Digestive problems. This may include peptic ulcers, heartburn, and gastroesophageal reflux disease (GERD). Oral health problems, such as gum disease, mouth sores, and tooth loss. Loss of taste and smell. Smoking also affects how you look and smell. Smoking may cause: Wrinkles. Yellow or stained teeth, fingers, and fingernails. Bad breath. Bad-smelling clothes and hair. Smoking tobacco can also affect your social life, because: It may be challenging to find places to smoke when away from home. Many workplaces, [employer], hotels, and public places are tobacco-free. Smoking is expensive. This is due to the cost of tobacco and the long-term costs of treating health problems from smoking. Secondhand smoke may affect those around you. Secondhand smoke can cause lung cancer, breathing problems, and heart disease. Children of smokers have a higher risk for: Sudden infant death syndrome (SIDS). Ear  infections. Lung infections. What actions can I take to prevent health problems? Quit smoking  Do not start smoking. Quit if you already smoke. Do not replace cigarette smoking with vaping devices, such as e-cigarettes. Make a plan to quit smoking and commit to it. Look for programs to help you, and ask your health care provider for recommendations and ideas. Set a date and write down all the reasons you want to quit. Let your friends and family know you are quitting so they can help and support you. Consider finding friends who also want to quit. It can be easier to quit with someone else, so that you can support each other. Talk with your health care provider about using nicotine replacement medicines to help you quit. These include gum, lozenges, patches, sprays, or pills. If you try to quit but return to smoking, stay positive. It is common to slip up when you first quit, so take it one day at a time. Be prepared for cravings. When you feel the urge to smoke, chew gum or suck on hard candy. Lifestyle Stay busy. Take care of your body. Get plenty of exercise, eat a healthy diet, and drink plenty of water. Find ways to manage your stress, such as meditation, yoga, exercise, or time spent with friends and family. Ask your health care provider about having regular tests (screenings) to check for cancer. This may include blood tests, imaging tests, and other tests. Where to find support To get support to quit smoking, consider: Asking your health care provider for more information and resources. Joining a support group for people who want to quit smoking in your local community. There are many effective programs that may help you  to quit. Calling the smokefree.gov counselor helpline at 1-800-QUIT-NOW 2765773762). Where to find more information You may find more information about quitting smoking from: Centers for Disease Control and Prevention: https://www.chang-huffman.com/ https://hall.com/:  smokefree.gov American Lung Association: freedomfromsmoking.org Contact a health care provider if: You have problems breathing. Your lips, nose, or fingers turn blue. You have chest pain. You are coughing up blood. You feel like you will faint. You have other health changes that cause you to worry. Summary Smoking tobacco can negatively affect your health, the health of those around you, your finances, and your social life. Do not start smoking. Quit if you already smoke. If you need help quitting, ask your health care provider. Consider joining a support group for people in your local community who want to quit smoking. There are many effective programs that may help you to quit. This information is not intended to replace advice given to you by your health care provider. Make sure you discuss any questions you have with your health care provider. Document Revised: 02/14/2021 Document Reviewed: 02/14/2021 Elsevier Patient Education  [place].

## 2021-11-15 NOTE — Telephone Encounter (Signed)
Patient requests updated office note and work note to be faxed to [employer] short term disability; authorization on file from initial forms.

## 2021-11-22 ENCOUNTER — Ambulatory Visit
Admission: RE | Admit: 2021-11-22 | Discharge: 2021-11-22 | Disposition: A | Discharge status: Home / Self Care | Payer: Medicaid Other | Source: Ambulatory Visit | Attending: Family Medicine | Admitting: Family Medicine

## 2021-11-22 DIAGNOSIS — Z1231 Encounter for screening mammogram for malignant neoplasm of breast: Secondary | ICD-10-CM

## 2021-11-22 NOTE — Telephone Encounter (Signed)
On Friday, 11/17/21, done.

## 2021-12-26 ENCOUNTER — Other Ambulatory Visit: Payer: Self-pay | Admitting: Family Medicine

## 2021-12-26 DIAGNOSIS — M25561 Pain in right knee: Secondary | ICD-10-CM

## 2021-12-26 DIAGNOSIS — N946 Dysmenorrhea, unspecified: Secondary | ICD-10-CM

## 2021-12-26 DIAGNOSIS — I10 Essential (primary) hypertension: Secondary | ICD-10-CM

## 2022-01-09 ENCOUNTER — Encounter: Payer: Self-pay | Admitting: Nurse Practitioner

## 2022-01-09 ENCOUNTER — Ambulatory Visit (INDEPENDENT_AMBULATORY_CARE_PROVIDER_SITE_OTHER): Payer: Medicaid Other | Admitting: Nurse Practitioner

## 2022-01-09 DIAGNOSIS — Z91199 Patient's noncompliance with other medical treatment and regimen due to unspecified reason: Secondary | ICD-10-CM

## 2022-01-09 NOTE — Progress Notes (Signed)
Patient did not want a phone visit today, she said she had called the office to notify.

## 2022-01-15 ENCOUNTER — Other Ambulatory Visit: Payer: Self-pay | Admitting: Family Medicine

## 2022-01-15 DIAGNOSIS — M25561 Pain in right knee: Secondary | ICD-10-CM

## 2022-01-15 DIAGNOSIS — I10 Essential (primary) hypertension: Secondary | ICD-10-CM

## 2022-01-15 NOTE — Telephone Encounter (Signed)
[place] NTBS 30 days given 12/26/21

## 2022-01-16 MED ORDER — AMLODIPINE BESYLATE 5 MG PO TABS: 5.0000 mg | ORAL_TABLET | Freq: Every day | ORAL | 0 refills | Status: DC

## 2022-01-16 NOTE — Addendum Note (Signed)
Addended by: Antonietta Barcelona D on: 01/16/2022 01:05 PM   Modules accepted: Orders

## 2022-01-16 NOTE — Telephone Encounter (Signed)
Apt scheduled 02/21/2022 [place]

## 2022-01-17 ENCOUNTER — Ambulatory Visit: Payer: Medicaid Other | Admitting: Family Medicine

## 2022-01-17 ENCOUNTER — Encounter: Payer: Self-pay | Admitting: Family Medicine

## 2022-01-17 VITALS — BP 102/78 | HR 74 | Temp 98.4°F | Ht 63.0 in | Wt 177.0 lb

## 2022-01-17 DIAGNOSIS — I10 Essential (primary) hypertension: Secondary | ICD-10-CM

## 2022-01-17 DIAGNOSIS — M7042 Prepatellar bursitis, left knee: Secondary | ICD-10-CM

## 2022-01-17 MED ORDER — LISINOPRIL 5 MG PO TABS: 5.0000 mg | ORAL_TABLET | Freq: Every day | ORAL | 0 refills | Status: DC

## 2022-01-17 MED ORDER — AMLODIPINE BESYLATE 5 MG PO TABS: 5.0000 mg | ORAL_TABLET | Freq: Every day | ORAL | 0 refills | Status: DC

## 2022-01-17 MED ORDER — PREDNISONE 20 MG PO TABS: ORAL_TABLET | ORAL | 0 refills | Status: DC

## 2022-01-17 NOTE — Progress Notes (Signed)
BP 102/78   Pulse 74   Temp 98.4 F (36.9 C)   Ht '5\' 3"'$  (1.6 m)   Wt 177 lb (80.3 kg)   LMP 08/10/2016 Comment: [place]  SpO2 95%   BMI 31.35 kg/m    Subjective:   Patient ID: Stacy Wolf, female    DOB: 07/04/1975, 46 y.o.   MRN: [id number]  HPI: Stacy Wolf is a 46 y.o. female presenting on 01/17/2022 for Knee Pain (Left- fell in work parking lot on 11/06/21)   HPI Left knee pain Patient is coming in today for left knee pain.  She says she injured the knee just over a month ago when she fell in a parking lot landed directly on the front of that right knee.  She says she has pain and sensitivity on the front of the knee even to light touch.  She says it does not hurt to move or stand or walk on the knee but just more when she touches her comes in contact with something on the front of that knee including her clothes.  She has been taking ibuprofen for it and it does help and is also been icing and that is helping but is not going away.  Relevant past medical, surgical, family and social history reviewed and updated as indicated. Interim medical history since our last visit reviewed. Allergies and medications reviewed and updated.  Review of Systems  Constitutional:  Negative for chills and fever.  Respiratory:  Negative for chest tightness and shortness of breath.   Cardiovascular:  Negative for chest pain and leg swelling.  Musculoskeletal:  Positive for arthralgias and joint swelling. Negative for back pain, gait problem and myalgias.  Skin:  Negative for color change and rash.  Neurological:  Negative for light-headedness and headaches.  Psychiatric/Behavioral:  Negative for agitation and behavioral problems.   All other systems reviewed and are negative.   Per HPI unless specifically indicated above   Allergies as of 01/17/2022       Reactions   Imitrex [sumatriptan] Anaphylaxis, Other (See Comments)   Could not swallow after taking med   Latuda [lurasidone Hcl]  Other (See Comments)   Suicidal thoughts   Wellbutrin [bupropion] Other (See Comments)   Seizures / lowered seizure threshold   Zoloft [sertraline Hcl] Hives   Xanax [alprazolam] Other (See Comments)   Passed out   Haloperidol And Related Other (See Comments)   Leg cramps   Nicoderm [nicotine] Dermatitis        Medication List        Accurate as of January 17, 2022 12:15 PM. If you have any questions, ask your nurse or doctor.          STOP taking these medications    metroNIDAZOLE 500 MG tablet Commonly known as: Flagyl Stopped by: Fransisca Kaufmann Berklie Dethlefs, MD   predniSONE 10 MG (48) Tbpk tablet Commonly known as: STERAPRED UNI-PAK 48 TAB Replaced by: predniSONE 20 MG tablet Stopped by: Fransisca Kaufmann Jamesina Gaugh, MD       TAKE these medications    amLODipine 5 MG tablet Commonly known as: NORVASC Take 1 tablet (5 mg total) by mouth daily.   aspirin-acetaminophen-caffeine 250-250-65 MG tablet Commonly known as: EXCEDRIN MIGRAINE Take 2 tablets by mouth every 6 (six) hours as needed for headache or migraine.   estradiol 2 MG tablet Commonly known as: ESTRACE Take 1 tablet (2 mg total) by mouth at bedtime.   fluticasone 50 MCG/ACT nasal spray Commonly known  as: FLONASE Place 1 spray into both nostrils 2 (two) times daily as needed for allergies or rhinitis.   gabapentin 100 MG capsule Commonly known as: NEURONTIN Take 1 capsule (100 mg total) by mouth 3 (three) times daily.   ibuprofen 800 MG tablet Commonly known as: ADVIL Take 1 tablet (800 mg total) by mouth every 8 (eight) hours as needed.   lisinopril 5 MG tablet Commonly known as: ZESTRIL Take 1 tablet (5 mg total) by mouth daily.   methocarbamol 500 MG tablet Commonly known as: ROBAXIN Take 1 tablet (500 mg total) by mouth 3 (three) times daily.   pantoprazole 20 MG tablet Commonly known as: PROTONIX Take 20 mg by mouth daily.   predniSONE 20 MG tablet Commonly known as: DELTASONE 2 po at same  time daily for 5 days Replaces: predniSONE 10 MG (48) Tbpk tablet Started by: Fransisca Kaufmann Michail Boyte, MD         Objective:   BP 102/78   Pulse 74   Temp 98.4 F (36.9 C)   Ht '5\' 3"'$  (1.6 m)   Wt 177 lb (80.3 kg)   LMP 08/10/2016 Comment: [place]  SpO2 95%   BMI 31.35 kg/m   Wt Readings from Last 3 Encounters:  01/17/22 177 lb (80.3 kg)  11/08/21 177 lb (80.3 kg)  09/18/21 174 lb (78.9 kg)    Physical Exam Vitals and nursing note reviewed.  Constitutional:      Appearance: Normal appearance.  Musculoskeletal:     Left knee: No crepitus. Normal range of motion. Tenderness (Anterior tenderness and slight swelling over the patella itself) present. No LCL laxity, MCL laxity, ACL laxity or PCL laxity.Normal alignment, normal meniscus and normal patellar mobility.  Neurological:     Mental Status: She is alert.       Assessment & Plan:   Problem List Items Addressed This Visit       Cardiovascular and Mediastinum   Essential hypertension, benign   Relevant Medications   amLODipine (NORVASC) 5 MG tablet   lisinopril (ZESTRIL) 5 MG tablet   Other Visit Diagnoses     Prepatellar bursitis of left knee    -  Primary   Relevant Medications   predniSONE (DELTASONE) 20 MG tablet       Based on exam, likely a nerve injury or prepatellar bursitis injury is causing inflammation, we will do short course of prednisone and continue with the ibuprofen.  She does have an orthopedic appointment on the seventh of next month just in case. Follow up plan: Return if symptoms worsen or fail to improve.  Counseling provided for all of the vaccine components No orders of the defined types were placed in this encounter.   Stacy Pina, MD [facility] 01/17/2022, 12:15 PM

## 2022-02-08 ENCOUNTER — Ambulatory Visit: Payer: Medicaid Other | Admitting: Orthopedic Surgery

## 2022-02-08 ENCOUNTER — Ambulatory Visit (INDEPENDENT_AMBULATORY_CARE_PROVIDER_SITE_OTHER): Payer: Medicaid Other

## 2022-02-08 ENCOUNTER — Encounter: Payer: Self-pay | Admitting: Orthopedic Surgery

## 2022-02-08 VITALS — Ht 63.0 in | Wt 175.0 lb

## 2022-02-08 DIAGNOSIS — M25562 Pain in left knee: Secondary | ICD-10-CM

## 2022-02-08 DIAGNOSIS — G8929 Other chronic pain: Secondary | ICD-10-CM | POA: Diagnosis not present

## 2022-02-08 DIAGNOSIS — S82132A Displaced fracture of medial condyle of left tibia, initial encounter for closed fracture: Secondary | ICD-10-CM

## 2022-02-08 NOTE — Patient Instructions (Addendum)
Central Scheduling [id number]  While we are working on your approval please go ahead and call to schedule your appointment to be done within one week. If you can not get an appointment at [facility] within the next week, ask if they have something sooner at [facility] or [facility] if you are able to go to [place] to have the imaging done.  AFTER you have made your imaging appointment, please call our office back at [phone] to schedule an appointment to review your results.  CURRENTLY [employer] ONLY AUTHORIZES IMAGING TO BE PERFORMED AT [facility]. THEY WILL NOT APPROVE ANY OTHER FACILITY  [facility] Address: [address] [place], [place] [address] Phone: [phone]   [facility] [address], [place] [address] Phone: [phone]     Out of work note for today  Wear the brace when you are walking  Alternate between ibuprofen 800 mg 3 times a day and Tylenol 500 mg every 6 hours

## 2022-02-08 NOTE — Progress Notes (Signed)
Chief Complaint  Patient presents with   Knee Pain    Left knee pain    New problem  Left knee  3 months ago the patient fell in a parking lot landed on her left knee comes in complaining of anterior medial left knee pain unrelieved by ibuprofen  Review of systems skin is normal neurologically normal  Ht '5\' 3"'$  (1.6 m)   Wt 175 lb (79.4 kg)   LMP 08/10/2016 Comment: [place]  BMI 31.00 kg/m   Physical Exam Vitals and nursing note reviewed.  Constitutional:      Appearance: Normal appearance.  HENT:     Head: Normocephalic and atraumatic.  Eyes:     General: No scleral icterus.       Right eye: No discharge.        Left eye: No discharge.     Extraocular Movements: Extraocular movements intact.     Conjunctiva/sclera: Conjunctivae normal.     Pupils: Pupils are equal, round, and reactive to light.  Cardiovascular:     Pulses: Normal pulses.  Musculoskeletal:     Comments: Left knee hyperextension, (patient's anatomy)  Tender over the proximal tibia on the bone some along the medial joint line ACL PCL intact  No effusion  Flexion normal  Motor normal  Skin:    General: Skin is warm and dry.     Capillary Refill: Capillary refill takes less than 2 seconds.  Neurological:     General: No focal deficit present.     Mental Status: She is alert and oriented to person, place, and time.  Psychiatric:        Mood and Affect: Mood normal.        Behavior: Behavior normal.    Imaging is normal left knee x-rays  Assessment and plan  She probably has a bone contusion/stress fracture of the left medial tibia  Recommend MRI to diagnose and confirm  Patient be placed in a economy hinged brace to unload the proximal tibia  Return after MRI  Alternate between Tylenol and ibuprofen for pain  Out of work 1 day

## 2022-02-21 ENCOUNTER — Encounter: Payer: Self-pay | Admitting: Family Medicine

## 2022-02-21 ENCOUNTER — Ambulatory Visit: Payer: Medicaid Other | Admitting: Family Medicine

## 2022-02-21 VITALS — BP 131/86 | HR 75 | Temp 97.6°F | Ht 63.0 in | Wt 177.0 lb

## 2022-02-21 DIAGNOSIS — E785 Hyperlipidemia, unspecified: Secondary | ICD-10-CM

## 2022-02-21 DIAGNOSIS — N946 Dysmenorrhea, unspecified: Secondary | ICD-10-CM

## 2022-02-21 DIAGNOSIS — K219 Gastro-esophageal reflux disease without esophagitis: Secondary | ICD-10-CM

## 2022-02-21 DIAGNOSIS — I10 Essential (primary) hypertension: Secondary | ICD-10-CM

## 2022-02-21 DIAGNOSIS — R6889 Other general symptoms and signs: Secondary | ICD-10-CM

## 2022-02-21 DIAGNOSIS — R0981 Nasal congestion: Secondary | ICD-10-CM

## 2022-02-21 LAB — VERITOR FLU A/B WAIVED
Influenza A: NEGATIVE
Influenza B: NEGATIVE

## 2022-02-21 MED ORDER — AMLODIPINE BESYLATE 5 MG PO TABS: 5.0000 mg | ORAL_TABLET | Freq: Every day | ORAL | 3 refills | Status: DC

## 2022-02-21 MED ORDER — LISINOPRIL 5 MG PO TABS: 5.0000 mg | ORAL_TABLET | Freq: Every day | ORAL | 3 refills | Status: DC

## 2022-02-21 MED ORDER — FLUTICASONE PROPIONATE 50 MCG/ACT NA SUSP: 1.0000 | Freq: Two times a day (BID) | NASAL | 6 refills | Status: AC | PRN

## 2022-02-21 NOTE — Progress Notes (Signed)
BP 131/86   Pulse 75   Temp 97.6 F (36.4 C)   Ht _0  (1.6 m)   Wt 177 lb (80.3 kg)   LMP 08/10/2016 Comment: [place]  SpO2 98%   BMI 31.35 kg/m    Subjective:   Patient ID: Stacy Wolf, female    DOB: 1976-01-29, 46 y.o.   MRN: [id number]  HPI: Stacy Wolf is a 46 y.o. female presenting on 02/21/2022 for Medical Management of Chronic Issues, Hypertension, and Hyperlipidemia   HPI Hypertension Patient is currently on amlodipine and lisinopril, and their blood pressure today is 131/86. Patient denies any lightheadedness or dizziness. Patient denies headaches, blurred vision, chest pains, shortness of breath, or weakness. Denies any side effects from medication and is content with current medication.   Hyperlipidemia Patient is coming in for recheck of his hyperlipidemia. The patient is currently taking none currently, trying diet control. They deny any issues with myalgias or history of liver damage from it. They deny any focal numbness or weakness or chest pain.   GERD Patient is currently on pantoprazole.  She denies any major symptoms or abdominal pain or belching or burping. She denies any blood in her stool or lightheadedness or dizziness.   Patient says she has been having some sinus pressure and congestion and drainage and sore throat that is been going on for the past day or 2.  She denies any sick contacts that she knows of.  She does feel little achy at times.  She has been using Coricidin over-the-counter and does help a little bit  Relevant past medical, surgical, family and social history reviewed and updated as indicated. Interim medical history since our last visit reviewed. Allergies and medications reviewed and updated.  Review of Systems  Constitutional:  Negative for chills and fever.  HENT:  Positive for congestion, postnasal drip, rhinorrhea, sinus pressure and sore throat. Negative for ear discharge, ear pain and sneezing.   Eyes:  Negative for pain,  redness and visual disturbance.  Respiratory:  Positive for cough. Negative for chest tightness and shortness of breath.   Cardiovascular:  Negative for chest pain and leg swelling.  Genitourinary:  Negative for difficulty urinating and dysuria.  Musculoskeletal:  Negative for back pain and gait problem.  Skin:  Negative for rash.  Neurological:  Positive for headaches. Negative for light-headedness.  Psychiatric/Behavioral:  Negative for agitation and behavioral problems.   All other systems reviewed and are negative.   Per HPI unless specifically indicated above   Allergies as of 02/21/2022       Reactions   Imitrex [sumatriptan] Anaphylaxis, Other (See Comments)   Could not swallow after taking med   Latuda [lurasidone Hcl] Other (See Comments)   Suicidal thoughts   Wellbutrin [bupropion] Other (See Comments)   Seizures / lowered seizure threshold   Zoloft [sertraline Hcl] Hives   Xanax [alprazolam] Other (See Comments)   Passed out   Haloperidol And Related Other (See Comments)   Leg cramps   Nicoderm [nicotine] Dermatitis        Medication List        Accurate as of February 21, 2022 11:58 AM. If you have any questions, ask your nurse or doctor.          STOP taking these medications    estradiol 2 MG tablet Commonly known as: ESTRACE Stopped by: Worthy Rancher, MD   predniSONE 20 MG tablet Commonly known as: DELTASONE Stopped by: Worthy Rancher, MD  TAKE these medications    amLODipine 5 MG tablet Commonly known as: NORVASC Take 1 tablet (5 mg total) by mouth daily.   aspirin-acetaminophen-caffeine 250-250-65 MG tablet Commonly known as: EXCEDRIN MIGRAINE Take 2 tablets by mouth every 6 (six) hours as needed for headache or migraine.   fluticasone 50 MCG/ACT nasal spray Commonly known as: FLONASE Place 1 spray into both nostrils 2 (two) times daily as needed for allergies or rhinitis.   gabapentin 100 MG capsule Commonly known  as: NEURONTIN Take 1 capsule (100 mg total) by mouth 3 (three) times daily.   ibuprofen 800 MG tablet Commonly known as: ADVIL Take 1 tablet (800 mg total) by mouth every 8 (eight) hours as needed.   lisinopril 5 MG tablet Commonly known as: ZESTRIL Take 1 tablet (5 mg total) by mouth daily.   methocarbamol 500 MG tablet Commonly known as: ROBAXIN Take 1 tablet (500 mg total) by mouth 3 (three) times daily.   pantoprazole 20 MG tablet Commonly known as: PROTONIX Take 20 mg by mouth daily.         Objective:   BP 131/86   Pulse 75   Temp 97.6 F (36.4 C)   Ht _0  (1.6 m)   Wt 177 lb (80.3 kg)   LMP 08/10/2016 Comment: [place]  SpO2 98%   BMI 31.35 kg/m   Wt Readings from Last 3 Encounters:  02/21/22 177 lb (80.3 kg)  02/08/22 175 lb (79.4 kg)  01/17/22 177 lb (80.3 kg)    Physical Exam Vitals and nursing note reviewed.  Constitutional:      General: She is not in acute distress.    Appearance: She is well-developed. She is not diaphoretic.  HENT:     Nose: Mucosal edema and rhinorrhea present.     Right Sinus: No maxillary sinus tenderness or frontal sinus tenderness.     Left Sinus: No maxillary sinus tenderness or frontal sinus tenderness.     Mouth/Throat:     Pharynx: Uvula midline. No oropharyngeal exudate or posterior oropharyngeal erythema.     Tonsils: No tonsillar abscesses.  Eyes:     Conjunctiva/sclera: Conjunctivae normal.  Cardiovascular:     Rate and Rhythm: Normal rate and regular rhythm.     Heart sounds: Normal heart sounds. No murmur heard. Pulmonary:     Effort: Pulmonary effort is normal. No respiratory distress.     Breath sounds: Normal breath sounds. No wheezing.  Musculoskeletal:        General: No tenderness. Normal range of motion.  Skin:    General: Skin is warm and dry.     Findings: No rash.  Neurological:     Mental Status: She is alert and oriented to person, place, and time.     Coordination: Coordination normal.   Psychiatric:        Behavior: Behavior normal.       Assessment & Plan:   Problem List Items Addressed This Visit       Cardiovascular and Mediastinum   Essential hypertension, benign   Relevant Medications   lisinopril (ZESTRIL) 5 MG tablet   amLODipine (NORVASC) 5 MG tablet   Other Relevant Orders   CBC with Differential/Platelet   CMP14+EGFR     Digestive   Gastroesophageal reflux disease without esophagitis     Other   Hyperlipidemia LDL goal <100 - Primary   Relevant Medications   lisinopril (ZESTRIL) 5 MG tablet   amLODipine (NORVASC) 5 MG tablet   Other Relevant  Orders   Lipid panel   Other Visit Diagnoses     Menstrual cramps       Sinus congestion       Relevant Medications   fluticasone (FLONASE) 50 MCG/ACT nasal spray   Flu-like symptoms       Relevant Orders   Novel Coronavirus, NAA ([facility])   Veritor Flu A/B Waived       Continue current medicine, seems to be doing well.  Blood pressure looks decent.  Will do blood work today.  COVID pending.  Flu negative  Will await COVID but treat conservatively, use Coricidin and Flonase and fluids Follow up plan: Return in about 6 months (around 08/23/2022), or if symptoms worsen or fail to improve, for Physical and hypertension and cholesterol.  Counseling provided for all of the vaccine components Orders Placed This Encounter  Procedures   Novel Coronavirus, NAA ([facility])   CBC with Differential/Platelet   CMP14+EGFR   Lipid panel   Veritor Flu A/B [place] Abayomi Pattison, MD [facility] 02/21/2022, 11:58 AM

## 2022-02-22 LAB — CBC WITH DIFFERENTIAL/PLATELET
Basophils Absolute: 0.1 10*3/uL (ref 0.0–0.2)
Basos: 1 %
EOS (ABSOLUTE): 0.1 10*3/uL (ref 0.0–0.4)
Eos: 3 %
Hematocrit: 36.4 % (ref 34.0–46.6)
Hemoglobin: 12.5 g/dL (ref 11.1–15.9)
Immature Grans (Abs): 0 10*3/uL (ref 0.0–0.1)
Immature Granulocytes: 0 %
Lymphocytes Absolute: 0.9 10*3/uL (ref 0.7–3.1)
Lymphs: 20 %
MCH: 32.4 pg (ref 26.6–33.0)
MCHC: 34.3 g/dL (ref 31.5–35.7)
MCV: 94 fL (ref 79–97)
Monocytes Absolute: 0.5 10*3/uL (ref 0.1–0.9)
Monocytes: 10 %
Neutrophils Absolute: 3.1 10*3/uL (ref 1.4–7.0)
Neutrophils: 66 %
Platelets: 206 10*3/uL (ref 150–450)
RBC: 3.86 x10E6/uL (ref 3.77–5.28)
RDW: 12.5 % (ref 11.7–15.4)
WBC: 4.7 10*3/uL (ref 3.4–10.8)

## 2022-02-22 LAB — CMP14+EGFR
ALT: 10 IU/L (ref 0–32)
AST: 16 IU/L (ref 0–40)
Albumin/Globulin Ratio: 1.9 (ref 1.2–2.2)
Albumin: 4.2 g/dL (ref 3.9–4.9)
Alkaline Phosphatase: 85 IU/L (ref 44–121)
BUN/Creatinine Ratio: 10 (ref 9–23)
BUN: 7 mg/dL (ref 6–24)
Bilirubin Total: <0.2 mg/dL (ref 0.0–1.2)
CO2: 24 mmol/L (ref 20–29)
Calcium: 9.1 mg/dL (ref 8.7–10.2)
Chloride: 108 mmol/L — ABNORMAL HIGH (ref 96–106)
Creatinine, Ser: 0.73 mg/dL (ref 0.57–1.00)
Globulin, Total: 2.2 g/dL (ref 1.5–4.5)
Glucose: 81 mg/dL (ref 70–99)
Potassium: 3.8 mmol/L (ref 3.5–5.2)
Sodium: 145 mmol/L — ABNORMAL HIGH (ref 134–144)
Total Protein: 6.4 g/dL (ref 6.0–8.5)
eGFR: 102 mL/min/{1.73_m2} (ref >59)

## 2022-02-22 LAB — LIPID PANEL
Chol/HDL Ratio: 3.6 ratio (ref 0.0–4.4)
Cholesterol, Total: 177 mg/dL (ref 100–199)
HDL: 49 mg/dL (ref >39)
LDL Chol Calc (NIH): 102 mg/dL — ABNORMAL HIGH (ref 0–99)
Triglycerides: 150 mg/dL — ABNORMAL HIGH (ref 0–149)
VLDL Cholesterol Cal: 26 mg/dL (ref 5–40)

## 2022-02-23 LAB — NOVEL CORONAVIRUS, NAA

## 2022-02-27 ENCOUNTER — Ambulatory Visit (HOSPITAL_COMMUNITY)
Admission: RE | Admit: 2022-02-27 | Discharge: 2022-02-27 | Disposition: A | Discharge status: Home / Self Care | Payer: Medicaid Other | Source: Ambulatory Visit | Attending: Orthopedic Surgery | Admitting: Orthopedic Surgery

## 2022-02-27 DIAGNOSIS — M25562 Pain in left knee: Secondary | ICD-10-CM | POA: Diagnosis present

## 2022-02-27 DIAGNOSIS — S82132A Displaced fracture of medial condyle of left tibia, initial encounter for closed fracture: Secondary | ICD-10-CM | POA: Insufficient documentation

## 2022-02-28 ENCOUNTER — Ambulatory Visit (HOSPITAL_COMMUNITY): Payer: Medicaid Other

## 2022-02-28 ENCOUNTER — Encounter (HOSPITAL_COMMUNITY): Payer: Self-pay

## 2022-03-02 ENCOUNTER — Encounter: Payer: Self-pay | Admitting: Orthopedic Surgery

## 2022-03-08 ENCOUNTER — Encounter: Payer: Self-pay | Admitting: Family Medicine

## 2022-03-08 ENCOUNTER — Ambulatory Visit: Payer: Medicaid Other | Admitting: Family Medicine

## 2022-03-08 VITALS — BP 131/84 | HR 85 | Temp 98.4°F | Ht 63.0 in | Wt 178.0 lb

## 2022-03-08 DIAGNOSIS — E785 Hyperlipidemia, unspecified: Secondary | ICD-10-CM | POA: Diagnosis not present

## 2022-03-08 DIAGNOSIS — Z0001 Encounter for general adult medical examination with abnormal findings: Secondary | ICD-10-CM

## 2022-03-08 DIAGNOSIS — I10 Essential (primary) hypertension: Secondary | ICD-10-CM

## 2022-03-08 DIAGNOSIS — Z0000 Encounter for general adult medical examination without abnormal findings: Secondary | ICD-10-CM

## 2022-03-08 NOTE — Progress Notes (Signed)
BP 131/84   Pulse 85   Temp 98.4 F (36.9 C)   Ht '5\' 3"'$  (1.6 m)   Wt 178 lb (80.7 kg)   LMP 08/10/2016 Comment: [place]  SpO2 98%   BMI 31.53 kg/m    Subjective:   Patient ID: Stacy Wolf, female    DOB: 1976-02-05, 47 y.o.   MRN: [id number]  HPI: Stacy Wolf is a 47 y.o. female presenting on 03/08/2022 for Medical Management of Chronic Issues (CPE no pap)   HPI Physical exam Patient is coming in today for physical exam.  She Pap smear today and says that she will schedule it out in the future but she comes in today for physical for work.  She does have blood work done a month ago and for most part blood work looked pretty good.  We talked about dietary changes including reducing sodium and reducing carbohydrates.  She says health wise she is doing pretty good except for subtle pressure in her ear yesterday but she is doing better with that today.Patient denies any chest pain, shortness of breath, headaches or vision issues, abdominal complaints, diarrhea, nausea, vomiting, or joint issues.   Hypertension Patient is currently on amlodipine and lisinopril, and their blood pressure today is 131/84. Patient denies any lightheadedness or dizziness. Patient denies headaches, blurred vision, chest pains, shortness of breath, or weakness. Denies any side effects from medication and is content with current medication.   Hyperlipidemia Patient is coming in for recheck of his hyperlipidemia. The patient is currently taking no medication, trying with diet. They deny any issues with myalgias or history of liver damage from it. They deny any focal numbness or weakness or chest pain.   Relevant past medical, surgical, family and social history reviewed and updated as indicated. Interim medical history since our last visit reviewed. Allergies and medications reviewed and updated.  Review of Systems  Constitutional:  Negative for chills and fever.  HENT:  Negative for ear pain and tinnitus.    Eyes:  Negative for pain and visual disturbance.  Respiratory:  Negative for cough, chest tightness, shortness of breath and wheezing.   Cardiovascular:  Negative for chest pain, palpitations and leg swelling.  Gastrointestinal:  Negative for abdominal pain, blood in stool, constipation and diarrhea.  Genitourinary:  Negative for difficulty urinating, dysuria and hematuria.  Musculoskeletal:  Negative for back pain, gait problem and myalgias.  Skin:  Negative for rash.  Neurological:  Negative for dizziness, weakness, light-headedness and headaches.  Psychiatric/Behavioral:  Negative for agitation, behavioral problems and suicidal ideas.   All other systems reviewed and are negative.   Per HPI unless specifically indicated above   Allergies as of 03/08/2022       Reactions   Imitrex [sumatriptan] Anaphylaxis, Other (See Comments)   Could not swallow after taking med   Latuda [lurasidone Hcl] Other (See Comments)   Suicidal thoughts   Wellbutrin [bupropion] Other (See Comments)   Seizures / lowered seizure threshold   Zoloft [sertraline Hcl] Hives   Xanax [alprazolam] Other (See Comments)   Passed out   Haloperidol And Related Other (See Comments)   Leg cramps   Nicoderm [nicotine] Dermatitis        Medication List        Accurate as of March 08, 2022  3:10 PM. If you have any questions, ask your nurse or doctor.          amLODipine 5 MG tablet Commonly known as: NORVASC Take 1  tablet (5 mg total) by mouth daily.   aspirin-acetaminophen-caffeine 250-250-65 MG tablet Commonly known as: EXCEDRIN MIGRAINE Take 2 tablets by mouth every 6 (six) hours as needed for headache or migraine.   fluticasone 50 MCG/ACT nasal spray Commonly known as: FLONASE Place 1 spray into both nostrils 2 (two) times daily as needed for allergies or rhinitis.   gabapentin 100 MG capsule Commonly known as: NEURONTIN Take 1 capsule (100 mg total) by mouth 3 (three) times daily.    ibuprofen 800 MG tablet Commonly known as: ADVIL Take 1 tablet (800 mg total) by mouth every 8 (eight) hours as needed.   lisinopril 5 MG tablet Commonly known as: ZESTRIL Take 1 tablet (5 mg total) by mouth daily.   methocarbamol 500 MG tablet Commonly known as: ROBAXIN Take 1 tablet (500 mg total) by mouth 3 (three) times daily.   pantoprazole 20 MG tablet Commonly known as: PROTONIX Take 20 mg by mouth daily.         Objective:   BP 131/84   Pulse 85   Temp 98.4 F (36.9 C)   Ht '5\' 3"'$  (1.6 m)   Wt 178 lb (80.7 kg)   LMP 08/10/2016 Comment: [place]  SpO2 98%   BMI 31.53 kg/m   Wt Readings from Last 3 Encounters:  03/08/22 178 lb (80.7 kg)  02/21/22 177 lb (80.3 kg)  02/08/22 175 lb (79.4 kg)    Physical Exam Vitals and nursing note reviewed.  Constitutional:      General: She is not in acute distress.    Appearance: Normal appearance. She is well-developed. She is not diaphoretic.  HENT:     Right Ear: Tympanic membrane normal.     Left Ear: Tympanic membrane normal.     Nose: No congestion or rhinorrhea.     Mouth/Throat:     Mouth: Mucous membranes are moist.     Pharynx: Oropharynx is clear. No oropharyngeal exudate or posterior oropharyngeal erythema.  Eyes:     Conjunctiva/sclera: Conjunctivae normal.  Cardiovascular:     Rate and Rhythm: Normal rate and regular rhythm.     Heart sounds: Normal heart sounds. No murmur heard. Pulmonary:     Effort: Pulmonary effort is normal. No respiratory distress.     Breath sounds: Normal breath sounds. No wheezing.  Abdominal:     General: Abdomen is flat. Bowel sounds are normal. There is no distension.     Tenderness: There is no abdominal tenderness. There is no guarding or rebound.  Musculoskeletal:        General: No swelling or tenderness. Normal range of motion.  Skin:    General: Skin is warm and dry.     Findings: No rash.  Neurological:     Mental Status: She is alert and oriented to person, place,  and time.     Coordination: Coordination normal.  Psychiatric:        Behavior: Behavior normal.       Assessment & Plan:   Problem List Items Addressed This Visit       Cardiovascular and Mediastinum   Essential hypertension, benign     Other   Hyperlipidemia LDL goal <100   Other Visit Diagnoses     Physical exam    -  Primary       Continue current medicine with no changes, focus on diet. Follow up plan: Return in about 6 months (around 09/06/2022), or if symptoms worsen or fail to improve, for Pap smear and  recheck hypertension and cholesterol.  Counseling provided for all of the vaccine components No orders of the defined types were placed in this encounter.   Caryl Pina, MD [facility] 03/08/2022, 3:10 PM

## 2022-05-03 ENCOUNTER — Encounter: Payer: Self-pay | Admitting: Radiology

## 2022-08-10 ENCOUNTER — Telehealth: Payer: Self-pay

## 2022-08-10 ENCOUNTER — Encounter: Payer: Self-pay | Admitting: Family Medicine

## 2022-08-10 ENCOUNTER — Ambulatory Visit: Payer: Medicaid Other | Admitting: Family Medicine

## 2022-08-10 ENCOUNTER — Other Ambulatory Visit (HOSPITAL_COMMUNITY)
Admission: RE | Admit: 2022-08-10 | Discharge: 2022-08-10 | Disposition: A | Discharge status: Home / Self Care | Payer: Medicaid Other | Source: Ambulatory Visit | Attending: Family Medicine | Admitting: Family Medicine

## 2022-08-10 VITALS — BP 125/83 | HR 65 | Temp 97.3°F | Ht 63.0 in | Wt 171.0 lb

## 2022-08-10 DIAGNOSIS — B9689 Other specified bacterial agents as the cause of diseases classified elsewhere: Secondary | ICD-10-CM

## 2022-08-10 DIAGNOSIS — N760 Acute vaginitis: Secondary | ICD-10-CM

## 2022-08-10 DIAGNOSIS — F609 Personality disorder, unspecified: Secondary | ICD-10-CM | POA: Diagnosis not present

## 2022-08-10 DIAGNOSIS — N898 Other specified noninflammatory disorders of vagina: Secondary | ICD-10-CM | POA: Diagnosis present

## 2022-08-10 LAB — URINALYSIS, ROUTINE W REFLEX MICROSCOPIC
Bilirubin, UA: NEGATIVE
Glucose, UA: NEGATIVE
Ketones, UA: NEGATIVE
Leukocytes,UA: NEGATIVE
Nitrite, UA: NEGATIVE
Specific Gravity, UA: 1.025 (ref 1.005–1.030)
Urobilinogen, Ur: 2 mg/dL — ABNORMAL HIGH (ref 0.2–1.0)
pH, UA: 6 (ref 5.0–7.5)

## 2022-08-10 LAB — MICROSCOPIC EXAMINATION
Renal Epithel, UA: NONE SEEN /hpf
WBC, UA: NONE SEEN /hpf (ref 0–5)

## 2022-08-10 LAB — WET PREP FOR TRICH, YEAST, CLUE
Clue Cell Exam: POSITIVE — AB
Trichomonas Exam: NEGATIVE
Yeast Exam: NEGATIVE

## 2022-08-10 MED ORDER — METRONIDAZOLE 500 MG PO TABS: 500.0000 mg | ORAL_TABLET | Freq: Two times a day (BID) | ORAL | 0 refills | Status: AC

## 2022-08-10 NOTE — Telephone Encounter (Signed)
Called and spoke w/pt re lab results and per Gabrielle,FNP, "Please let patient know that test are positive for BV. Sent in metronidazole BID for 7 days to [facility] in [place]. If other tests return positive, will call with results." Pt voiced understanding and nothing further.

## 2022-08-10 NOTE — Progress Notes (Signed)
Acute Office Visit  Subjective:  Patient ID: Stacy Wolf, female    DOB: 10/28/1975, 47 y.o.   MRN: [id number]  Chief Complaint  Patient presents with   Acute Visit    Cramping/odor watery discharge started last week   HPI Patient is in today for cramping and vaginal discharge. Started last week. Has not tried anything for it. Has noticed a strong smell with intercourse. States that she was having pain during intercourse that was new as well. Denies new partners. States that she has had a history of BV. States that she did change soaps recently from dial to dove and she switches between tide and gain detergents. Reports that she stopped taking her Estradiol in January of this year.   ROS As per HPI  Objective:  BP 125/83   Pulse 65   Temp (!) 97.3 F (36.3 C) (Oral)   Ht 5\' 3"  (1.6 m)   Wt 171 lb (77.6 kg)   LMP 08/10/2016 Comment: [facility]  SpO2 98%   BMI 30.29 kg/m    Physical Exam Constitutional:      General: She is awake. She is not in acute distress.    Appearance: Normal appearance. She is well-developed and well-groomed. She is not ill-appearing, toxic-appearing or diaphoretic.  Cardiovascular:     Rate and Rhythm: Normal rate.     Pulses: Normal pulses.          Radial pulses are 2+ on the right side and 2+ on the left side.       Posterior tibial pulses are 2+ on the right side and 2+ on the left side.     Heart sounds: Normal heart sounds. No murmur heard.    No gallop.  Pulmonary:     Effort: Pulmonary effort is normal. No respiratory distress.     Breath sounds: Normal breath sounds. No stridor. No wheezing, rhonchi or rales.  Abdominal:     General: Abdomen is flat.     Palpations: Abdomen is soft.     Tenderness: There is no abdominal tenderness. There is no right CVA tenderness or left CVA tenderness.  Musculoskeletal:     Cervical back: Full passive range of motion without pain and neck supple.     Right lower leg: No edema.     Left lower leg: No  edema.  Skin:    General: Skin is warm.     Capillary Refill: Capillary refill takes less than 2 seconds.  Neurological:     General: No focal deficit present.     Mental Status: She is alert, oriented to person, place, and time and easily aroused. Mental status is at baseline.     GCS: GCS eye subscore is 4. GCS verbal subscore is 5. GCS motor subscore is 6.     Motor: No weakness.  Psychiatric:        Attention and Perception: Attention and perception normal.        Mood and Affect: Mood and affect normal.        Speech: Speech normal.        Behavior: Behavior normal. Behavior is cooperative.        Thought Content: Thought content normal. Thought content does not include homicidal or suicidal ideation. Thought content does not include homicidal or suicidal plan.        Cognition and Memory: Cognition and memory normal.        Judgment: Judgment normal.       08/10/2022  11:02 AM 03/08/2022    2:41 PM 11/08/2021    1:12 PM  Depression screen PHQ 2/9  Decreased Interest 0 0 0  Down, Depressed, Hopeless 0 0 0  PHQ - 2 Score 0 0 0  Altered sleeping 3 0 0  Tired, decreased energy 3 0 0  Change in appetite 0 0 0  Feeling bad or failure about yourself  0 0 0  Trouble concentrating 0 0 0  Moving slowly or fidgety/restless 0 0 0  Suicidal thoughts 0 0 0  PHQ-9 Score 6 0 0  Difficult doing work/chores Not difficult at all Not difficult at all Not difficult at all      08/10/2022   11:02 AM 03/08/2022    2:41 PM 11/08/2021    1:12 PM 09/18/2021    8:01 AM  GAD 7 : Generalized Anxiety Score  Nervous, Anxious, on Edge 3 0 0 0  Control/stop worrying 0 0 0 0  Worry too much - different things 3 0 0 0  Trouble relaxing 0 0 0 0  Restless 0 0 0 0  Easily annoyed or irritable 3 0 0 0  Afraid - awful might happen 3 0 0 0  Total GAD 7 Score 12 0 0 0  Anxiety Difficulty Somewhat difficult Not difficult at all Not difficult at all Not difficult at all   Assessment & Plan:  1. Vaginal  discharge Labs as below. Will communicate results to patient once available.  Results of Microscopic exam indicated that patient has Bacterial vaginosis. Will send in antibiotic as below.  - Urinalysis, Routine w reflex microscopic - Urine cytology ancillary only - WET PREP FOR TRICH, YEAST, CLUE Declined testing for HIV, Hep B  2. Personality disorder (HCC) Pt screened positive for depression and anxiety today. Safety contract established today with patient in clinic. Denies intent to harm herself or others. Pt to notify primary care provider if they would like to initiate treatment.   3. Bacterial vaginosis - metroNIDAZOLE (FLAGYL) 500 MG tablet; Take 1 tablet (500 mg total) by mouth 2 (two) times daily for 7 days.  Dispense: 14 tablet; Refill: 0  The above assessment and management plan was discussed with the patient. The patient verbalized understanding of and has agreed to the management plan using shared-decision making. Patient is aware to call the clinic if they develop any new symptoms or if symptoms fail to improve or worsen. Patient is aware when to return to the clinic for a follow-up visit. Patient educated on when it is appropriate to go to the emergency department.   Return if symptoms worsen or fail to improve.  Neale Burly, DNP-FNP [facility] [address] [place], [place] [address] [phone]

## 2022-08-13 LAB — URINE CYTOLOGY ANCILLARY ONLY
Bacterial Vaginitis-Urine: NEGATIVE
Candida Urine: POSITIVE — AB
Chlamydia: NEGATIVE
Comment: NEGATIVE
Comment: NEGATIVE
Comment: NORMAL
Neisseria Gonorrhea: NEGATIVE
Trichomonas: NEGATIVE

## 2022-08-15 MED ORDER — FLUCONAZOLE 150 MG PO TABS: 150.0000 mg | ORAL_TABLET | ORAL | 0 refills | Status: DC

## 2022-08-15 NOTE — Addendum Note (Signed)
Addended by: Neale Burly on: 08/15/2022 07:50 AM   Modules accepted: Orders

## 2022-08-15 NOTE — Progress Notes (Signed)
Will send in diflucan once weekly for positive candida in urine. Will need to schedule lab appt for repeat UA/culture once medication is complete (can do this at her appt with [facility] in July)

## 2022-08-29 ENCOUNTER — Telehealth: Payer: Self-pay | Admitting: Family Medicine

## 2022-08-29 DIAGNOSIS — Z0279 Encounter for issue of other medical certificate: Secondary | ICD-10-CM

## 2022-08-29 NOTE — Telephone Encounter (Signed)
Stacy Wolf dropped off FMLA forms to be completed and signed.  Form Fee Paid? (Y/N)  y          If NO, form is placed on front office manager desk to hold until payment received. If YES, then form will be placed in the RX/HH Nurse Coordinators box for completion.  Form will not be processed until payment is received

## 2022-08-30 NOTE — Telephone Encounter (Signed)
Information completed and forwarded to PCP 

## 2022-09-10 NOTE — Telephone Encounter (Signed)
PCP completed and signed FMLA forms. They have been faxed to [employer] at fax number [phone]. Patient has been contacted and informed they are complete.  

## 2022-10-01 ENCOUNTER — Encounter: Payer: Medicaid Other | Admitting: Family Medicine

## 2022-10-08 ENCOUNTER — Encounter: Payer: Self-pay | Admitting: *Deleted

## 2022-10-31 ENCOUNTER — Other Ambulatory Visit: Payer: Self-pay | Admitting: Nurse Practitioner

## 2022-10-31 ENCOUNTER — Ambulatory Visit: Payer: Medicaid Other | Admitting: Nurse Practitioner

## 2022-10-31 ENCOUNTER — Encounter: Payer: Self-pay | Admitting: Nurse Practitioner

## 2022-10-31 VITALS — BP 119/71 | HR 60 | Temp 97.7°F | Ht 63.0 in | Wt 170.8 lb

## 2022-10-31 DIAGNOSIS — N3000 Acute cystitis without hematuria: Secondary | ICD-10-CM | POA: Insufficient documentation

## 2022-10-31 DIAGNOSIS — R109 Unspecified abdominal pain: Secondary | ICD-10-CM | POA: Insufficient documentation

## 2022-10-31 LAB — URINALYSIS, ROUTINE W REFLEX MICROSCOPIC
Bilirubin, UA: NEGATIVE
Glucose, UA: NEGATIVE
Ketones, UA: NEGATIVE
Leukocytes,UA: NEGATIVE
Nitrite, UA: NEGATIVE
Protein,UA: NEGATIVE
Specific Gravity, UA: >1.03 — ABNORMAL HIGH (ref 1.005–1.030)
Urobilinogen, Ur: 0.2 mg/dL (ref 0.2–1.0)
pH, UA: 5.5 (ref 5.0–7.5)

## 2022-10-31 LAB — MICROSCOPIC EXAMINATION
RBC, Urine: NONE SEEN /hpf (ref 0–2)
Renal Epithel, UA: NONE SEEN /hpf
Yeast, UA: NONE SEEN

## 2022-10-31 MED ORDER — NITROFURANTOIN MONOHYD MACRO 100 MG PO CAPS: 100.0000 mg | ORAL_CAPSULE | Freq: Two times a day (BID) | ORAL | 0 refills | Status: AC

## 2022-10-31 NOTE — Progress Notes (Signed)
Acute Office Visit  Subjective:     Patient ID: Stacy Wolf, female    DOB: June 03, 1975, 47 y.o.   MRN: [id number]  Chief Complaint  Patient presents with   cramps    Cramps on lower left side, pt has had hysterectomy and was told she had a cyst then but it was not removed. Cramps started last Friday has taken ibuprofen for pain with some relief      HPI Stacy Wolf is 53 yrs female seen today as an acute visit for abdominal cramping  Abdominal Pain: Patient complains of abdominal pain. The pain is described as cramping, and is 9/10 in intensity. Pain is located in the RLQ without radiation. Onset was 6 days ago. Symptoms have been gradually worsening since. Aggravating factors: movement.  Alleviating factors: acetaminophen. Associated symptoms: diarrhea. The patient denies anorexia, arthralgias, belching, chills, fever, myalgias, and nausea.    Active Ambulatory Problems    Diagnosis Date Noted   Bipolar disorder (HCC) 08/25/2013   Hyperlipidemia LDL goal <100 09/14/2013   Gastroesophageal reflux disease without esophagitis 08/24/2014   Tobacco abuse 02/02/2015   Migraine headache 04/18/2015   Essential hypertension, benign 01/16/2016   Obesity (BMI 30.0-34.9) 04/23/2016   Menorrhagia with irregular cycle 08/13/2016   Posttraumatic stress disorder 10/17/2016   Personality disorder (HCC) 10/17/2016   Current use of estrogen therapy 01/20/2019   Status post partial hysterectomy 08/19/2020   Meniscus, lateral, derangement, right    Acute cystitis without hematuria 10/31/2022   Abdominal cramping 10/31/2022   Resolved Ambulatory Problems    Diagnosis Date Noted   Constipation 02/02/2015   Carpal tunnel syndrome, left upper limb    Metabolic syndrome 04/23/2016   Status post trigger finger release 08/29/2016   Conduct disorder 10/17/2016   Carpal tunnel syndrome of right wrist    S/P carpal tunnel release right 03/13/18 03/28/2018   Trigger finger, acquired 04/28/2018    Screening for colorectal cancer 01/20/2019   Encounter for gynecological examination with Papanicolaou smear of cervix 01/20/2019   Routine medical exam 01/20/2019   Pain with urination 01/20/2019   Routine cervical smear 01/20/2019   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   Abscess 03/08/2021   Past Medical History:  Diagnosis Date   Anxiety    Bipolar 1 disorder (HCC)    Blood transfusion without reported diagnosis 2000   Depression    GERD (gastroesophageal reflux disease)    Glaucoma    Heart murmur    History of kidney stones    Hypertension    Migraines    Schizophrenia (HCC)    Seasonal allergies     ROS Negative unless indicated in HPI    Objective:    BP 119/71   Pulse 60   Temp 97.7 F (36.5 C) (Temporal)   Ht 5\' 3"  (1.6 m)   Wt 170 lb 12.8 oz (77.5 kg)   LMP 08/10/2016 Comment: [facility]  SpO2 100%   BMI 30.26 kg/m  BP Readings from Last 3 Encounters:  10/31/22 119/71  08/10/22 125/83  03/08/22 131/84   Wt Readings from Last 3 Encounters:  10/31/22 170 lb 12.8 oz (77.5 kg)  08/10/22 171 lb (77.6 kg)  03/08/22 178 lb (80.7 kg)      Physical Exam  Appears well, in no apparent distress.  Vital signs are normal. The abdomen is soft without tenderness, guarding, mass, rebound or organomegaly. No CVA tenderness or inguinal adenopathy noted. Urine dipstick shows positive for WBC's and urine is cloudy.  Micro exam: +0-5 WBC's per HPF, many+ bacteria, and epithelial cells.   No results found for any visits on 10/31/22.      Assessment & Plan:  Abdominal cramping -     Urinalysis, Routine w reflex microscopic -     Nitrofurantoin Monohyd Macro; Take 1 capsule (100 mg total) by mouth 2 (two) times daily for 7 days.  Dispense: 14 capsule; Refill: 0  Acute cystitis without hematuria -     Nitrofurantoin Monohyd Macro; Take 1 capsule (100 mg total) by mouth 2 (two) times daily for 7 days.  Dispense: 14 capsule; Refill: 0 -     Urine Culture   Tress is 47  yrs old African American, no acute distress UTI uncomplicated without evidence of pyelonephritis  PLAN: Microbid 1-tab BID for 7-days #14 dispensed -Increase hydration  may use Pyridium OTC prn. Call or return to clinic prn if these symptoms worsen or fail to improve as anticipated.   The above assessment and management plan was discussed with the patient. The patient verbalized understanding of and has agreed to the management plan. Patient is aware to call the clinic if they develop any new symptoms or if symptoms persist or worsen. Patient is aware when to return to the clinic for a follow-up visit. Patient educated on when it is appropriate to go to the emergency department.  Return if symptoms worsen or fail to improve.  Arrie Aran Santa Lighter, DNP [facility] [address] [place], [place] [address] [phone]

## 2022-11-02 ENCOUNTER — Encounter: Payer: Medicaid Other | Admitting: Family Medicine

## 2022-11-02 LAB — URINE CULTURE

## 2022-11-12 ENCOUNTER — Ambulatory Visit (INDEPENDENT_AMBULATORY_CARE_PROVIDER_SITE_OTHER): Payer: Medicaid Other | Admitting: Family Medicine

## 2022-11-12 ENCOUNTER — Encounter: Payer: Self-pay | Admitting: Family Medicine

## 2022-11-12 ENCOUNTER — Other Ambulatory Visit (HOSPITAL_COMMUNITY)
Admission: RE | Admit: 2022-11-12 | Discharge: 2022-11-12 | Disposition: A | Discharge status: Home / Self Care | Payer: Medicaid Other | Source: Ambulatory Visit | Attending: Family Medicine | Admitting: Family Medicine

## 2022-11-12 VITALS — BP 127/85 | HR 52 | Ht 63.0 in | Wt 175.0 lb

## 2022-11-12 DIAGNOSIS — Z90710 Acquired absence of both cervix and uterus: Secondary | ICD-10-CM

## 2022-11-12 DIAGNOSIS — K219 Gastro-esophageal reflux disease without esophagitis: Secondary | ICD-10-CM

## 2022-11-12 DIAGNOSIS — E785 Hyperlipidemia, unspecified: Secondary | ICD-10-CM | POA: Diagnosis not present

## 2022-11-12 DIAGNOSIS — Z01419 Encounter for gynecological examination (general) (routine) without abnormal findings: Secondary | ICD-10-CM | POA: Insufficient documentation

## 2022-11-12 DIAGNOSIS — I10 Essential (primary) hypertension: Secondary | ICD-10-CM | POA: Diagnosis not present

## 2022-11-12 DIAGNOSIS — E8940 Asymptomatic postprocedural ovarian failure: Secondary | ICD-10-CM

## 2022-11-12 MED ORDER — ESTRADIOL 1 MG PO TABS: 1.0000 mg | ORAL_TABLET | Freq: Every day | ORAL | 3 refills | Status: DC

## 2022-11-12 NOTE — Progress Notes (Signed)
BP 127/85   Pulse (!) 52   Ht 5\' 3"  (1.6 m)   Wt 175 lb (79.4 kg)   LMP 08/10/2016 Comment: SCH  SpO2 97%   BMI 31.00 kg/m    Subjective:   Patient ID: Stacy Wolf, female    DOB: [birth year], 47 y.o.   MRN: [id number]  HPI: Stacy Wolf is a 47 y.o. female presenting on 11/12/2022 for Medical Management of Chronic Issues (CPE with pap)   HPI Physical exam Patient denies any chest pain, shortness of breath, headaches or vision issues, abdominal complaints, diarrhea, nausea, vomiting, or joint issues.  She finished an antibiotic but we feel that she has been having some light vaginal discharge without a lot of odor but does have some irritation vaginally as well.  She denies any vaginal bleeding.  She does have some lower pelvic cramping.  Since her hysterectomy she has been taking hormonal replacement but I do not have the dose of it.  She is still smoking we discussed the risk of smoking and hormone replacement therapy.  She is working on quitting smoking.  Hypertension Patient is currently on amlodipine and lisinopril, and their blood pressure today is 127/85. Patient denies any lightheadedness or dizziness. Patient denies headaches, blurred vision, chest pains, shortness of breath, or weakness. Denies any side effects from medication and is content with current medication.   Hyperlipidemia Patient is coming in for recheck of his hyperlipidemia. The patient is currently taking no medication. They deny any issues with myalgias or history of liver damage from it. They deny any focal numbness or weakness or chest pain.   GERD Patient is currently on pantoprazole.  She denies any major symptoms or abdominal pain or belching or burping. She denies any blood in her stool or lightheadedness or dizziness.   Relevant past medical, surgical, family and social history reviewed and updated as indicated. Interim medical history since our last visit reviewed. Allergies and medications reviewed  and updated.  Review of Systems  Constitutional:  Negative for chills and fever.  HENT:  Negative for congestion, ear discharge and ear pain.   Eyes:  Negative for redness and visual disturbance.  Respiratory:  Negative for chest tightness and shortness of breath.   Cardiovascular:  Negative for chest pain and leg swelling.  Endocrine: Positive for heat intolerance.  Genitourinary:  Positive for vaginal discharge. Negative for difficulty urinating, dysuria, pelvic pain, vaginal bleeding and vaginal pain.  Musculoskeletal:  Negative for back pain and gait problem.  Skin:  Negative for rash.  Neurological:  Negative for light-headedness and headaches.  Psychiatric/Behavioral:  Negative for agitation, behavioral problems and dysphoric mood. The patient is not nervous/anxious.   All other systems reviewed and are negative.   Per HPI unless specifically indicated above   Allergies as of 11/12/2022       Reactions   Imitrex [sumatriptan] Anaphylaxis, Other (See Comments)   Could not swallow after taking med   Latuda [lurasidone Hcl] Other (See Comments)   Suicidal thoughts   Wellbutrin [bupropion] Other (See Comments)   Seizures / lowered seizure threshold   Zoloft [sertraline Hcl] Hives   Xanax [alprazolam] Other (See Comments)   Passed out   Haloperidol And Related Other (See Comments)   Leg cramps   Nicoderm [nicotine] Dermatitis        Medication List        Accurate as of November 12, 2022  9:53 AM. If you have any questions, ask your nurse  or doctor.          STOP taking these medications    fluconazole 150 MG tablet Commonly known as: DIFLUCAN Stopped by: Elige Radon Adrian Dinovo       TAKE these medications    amLODipine 5 MG tablet Commonly known as: NORVASC Take 1 tablet (5 mg total) by mouth daily.   aspirin-acetaminophen-caffeine 250-250-65 MG tablet Commonly known as: EXCEDRIN MIGRAINE Take 2 tablets by mouth every 6 (six) hours as needed for headache  or migraine.   estradiol 1 MG tablet Commonly known as: Estrace Take 1 tablet (1 mg total) by mouth daily. Started by: Elige Radon Musa Rewerts   fluticasone 50 MCG/ACT nasal spray Commonly known as: FLONASE Place 1 spray into both nostrils 2 (two) times daily as needed for allergies or rhinitis.   gabapentin 100 MG capsule Commonly known as: NEURONTIN Take 1 capsule (100 mg total) by mouth 3 (three) times daily.   ibuprofen 800 MG tablet Commonly known as: ADVIL Take 1 tablet (800 mg total) by mouth every 8 (eight) hours as needed.   lisinopril 5 MG tablet Commonly known as: ZESTRIL Take 1 tablet (5 mg total) by mouth daily.   methocarbamol 500 MG tablet Commonly known as: ROBAXIN Take 1 tablet (500 mg total) by mouth 3 (three) times daily.   pantoprazole 20 MG tablet Commonly known as: PROTONIX Take 20 mg by mouth daily.         Objective:   BP 127/85   Pulse (!) 52   Ht 5\' 3"  (1.6 m)   Wt 175 lb (79.4 kg)   LMP 08/10/2016 Comment: SCH  SpO2 97%   BMI 31.00 kg/m   Wt Readings from Last 3 Encounters:  11/12/22 175 lb (79.4 kg)  10/31/22 170 lb 12.8 oz (77.5 kg)  08/10/22 171 lb (77.6 kg)    Physical Exam Vitals and nursing note reviewed. Exam conducted with a chaperone present.  Constitutional:      General: She is not in acute distress.    Appearance: She is well-developed. She is not diaphoretic.  Eyes:     Conjunctiva/sclera: Conjunctivae normal.     Pupils: Pupils are equal, round, and reactive to light.  Neck:     Thyroid: No thyromegaly.  Cardiovascular:     Rate and Rhythm: Normal rate and regular rhythm.     Heart sounds: Normal heart sounds. No murmur heard. Pulmonary:     Effort: Pulmonary effort is normal. No respiratory distress.     Breath sounds: Normal breath sounds. No wheezing.  Chest:  Breasts:    Breasts are symmetrical.     Right: No inverted nipple, mass, nipple discharge, skin change or tenderness.     Left: No inverted nipple,  mass, nipple discharge, skin change or tenderness.  Abdominal:     General: Bowel sounds are normal. There is no distension.     Palpations: Abdomen is soft.     Tenderness: There is no abdominal tenderness. There is no guarding or rebound.  Genitourinary:    Exam position: Lithotomy position.     Labia:        Right: No rash or lesion.        Left: No rash or lesion.      Vagina: Vaginal discharge (Small amount of white discharge) present. No tenderness or bleeding.     Cervix: No cervical motion tenderness, discharge or friability.     Uterus: Not deviated, not enlarged, not fixed and not tender.  Adnexa:        Right: No mass or tenderness.         Left: No mass or tenderness.    Musculoskeletal:        General: No tenderness. Normal range of motion.     Cervical back: Neck supple.  Lymphadenopathy:     Cervical: No cervical adenopathy.  Skin:    General: Skin is warm and dry.     Findings: No rash.  Neurological:     Mental Status: She is alert and oriented to person, place, and time.     Coordination: Coordination normal.  Psychiatric:        Behavior: Behavior normal.       Assessment & Plan:   Problem List Items Addressed This Visit       Cardiovascular and Mediastinum   Essential hypertension, benign   Relevant Orders   CMP14+EGFR     Digestive   Gastroesophageal reflux disease without esophagitis   Relevant Orders   CBC with Differential/Platelet   CMP14+EGFR     Other   Hyperlipidemia LDL goal <100   Relevant Orders   Lipid panel   Other Visit Diagnoses     Well woman exam with routine gynecological exam    -  Primary   Relevant Orders   Cytology - PAP([place])   CBC with Differential/Platelet   CMP14+EGFR   Lipid panel   RPR   HepB+HepC+HIV Panel   Post hysterectomy menopause       Relevant Medications   estradiol (ESTRACE) 1 MG tablet     Continue current medicine including the estrogen, discussed the smoking cessation and she  will work on that.  Discussed the risk for blood clots and she is understandable.  She is having hot flashes so that is what she wants to continue with the medicine.  Blood pressure and everything looks good today.  No other changes  With Pap smear swab will check for vaginal discharge that she has been having  Follow up plan: Return in about 6 months (around 05/12/2023), or if symptoms worsen or fail to improve, for Hypertension and hyperlipidemia..  Counseling provided for all of the vaccine components Orders Placed This Encounter  Procedures   CBC with Differential/Platelet   CMP14+EGFR   Lipid panel   RPR   HepB+HepC+HIV Panel    Arville Care, MD [place] 11/12/2022, 9:53 AM

## 2022-11-13 LAB — CMP14+EGFR
ALT: 9 IU/L (ref 0–32)
AST: 16 IU/L (ref 0–40)
Albumin: 4.2 g/dL (ref 3.9–4.9)
Alkaline Phosphatase: 99 IU/L (ref 44–121)
BUN/Creatinine Ratio: 12 (ref 9–23)
BUN: 10 mg/dL (ref 6–24)
Bilirubin Total: <0.2 mg/dL (ref 0.0–1.2)
CO2: 23 mmol/L (ref 20–29)
Calcium: 9.2 mg/dL (ref 8.7–10.2)
Chloride: 106 mmol/L (ref 96–106)
Creatinine, Ser: 0.85 mg/dL (ref 0.57–1.00)
Globulin, Total: 2.1 g/dL (ref 1.5–4.5)
Glucose: 91 mg/dL (ref 70–99)
Potassium: 3.8 mmol/L (ref 3.5–5.2)
Sodium: 142 mmol/L (ref 134–144)
Total Protein: 6.3 g/dL (ref 6.0–8.5)
eGFR: 84 mL/min/{1.73_m2} (ref >59)

## 2022-11-13 LAB — LIPID PANEL
Chol/HDL Ratio: 3.4 ratio (ref 0.0–4.4)
Cholesterol, Total: 159 mg/dL (ref 100–199)
HDL: 47 mg/dL (ref >39)
LDL Chol Calc (NIH): 87 mg/dL (ref 0–99)
Triglycerides: 145 mg/dL (ref 0–149)
VLDL Cholesterol Cal: 25 mg/dL (ref 5–40)

## 2022-11-13 LAB — HEPB+HEPC+HIV PANEL
HIV Screen 4th Generation wRfx: NONREACTIVE
Hep B C IgM: NEGATIVE
Hep B Core Total Ab: NEGATIVE
Hep B E Ab: NONREACTIVE
Hep B E Ag: NEGATIVE
Hep B Surface Ab, Qual: REACTIVE
Hep C Virus Ab: NONREACTIVE
Hepatitis B Surface Ag: NEGATIVE

## 2022-11-13 LAB — CBC WITH DIFFERENTIAL/PLATELET
Basophils Absolute: 0 10*3/uL (ref 0.0–0.2)
Basos: 1 %
EOS (ABSOLUTE): 0.2 10*3/uL (ref 0.0–0.4)
Eos: 2 %
Hematocrit: 37.7 % (ref 34.0–46.6)
Hemoglobin: 12.5 g/dL (ref 11.1–15.9)
Immature Grans (Abs): 0 10*3/uL (ref 0.0–0.1)
Immature Granulocytes: 0 %
Lymphocytes Absolute: 4.3 10*3/uL — ABNORMAL HIGH (ref 0.7–3.1)
Lymphs: 50 %
MCH: 32.1 pg (ref 26.6–33.0)
MCHC: 33.2 g/dL (ref 31.5–35.7)
MCV: 97 fL (ref 79–97)
Monocytes Absolute: 0.4 10*3/uL (ref 0.1–0.9)
Monocytes: 5 %
Neutrophils Absolute: 3.7 10*3/uL (ref 1.4–7.0)
Neutrophils: 42 %
Platelets: 238 10*3/uL (ref 150–450)
RBC: 3.89 x10E6/uL (ref 3.77–5.28)
RDW: 13.3 % (ref 11.7–15.4)
WBC: 8.7 10*3/uL (ref 3.4–10.8)

## 2022-11-13 LAB — RPR: RPR Ser Ql: NONREACTIVE

## 2022-11-14 LAB — CYTOLOGY - PAP
Adequacy: ABSENT
Chlamydia: NEGATIVE
Comment: NEGATIVE
Comment: NORMAL
Diagnosis: NEGATIVE
Neisseria Gonorrhea: NEGATIVE

## 2022-11-16 ENCOUNTER — Other Ambulatory Visit: Payer: Self-pay | Admitting: Orthopedic Surgery

## 2023-01-24 ENCOUNTER — Ambulatory Visit: Payer: Medicaid Other | Admitting: Family Medicine

## 2023-01-24 ENCOUNTER — Encounter: Payer: Self-pay | Admitting: Family Medicine

## 2023-01-24 VITALS — BP 112/68 | HR 60 | Temp 98.3°F | Ht 63.0 in | Wt 169.0 lb

## 2023-01-24 DIAGNOSIS — Z1331 Encounter for screening for depression: Secondary | ICD-10-CM

## 2023-01-24 DIAGNOSIS — H02841 Edema of right upper eyelid: Secondary | ICD-10-CM | POA: Diagnosis not present

## 2023-01-24 MED ORDER — AMOXICILLIN-POT CLAVULANATE 875-125 MG PO TABS: 1.0000 | ORAL_TABLET | Freq: Two times a day (BID) | ORAL | 0 refills | Status: AC

## 2023-01-24 NOTE — Progress Notes (Signed)
Subjective:  Patient ID: Stacy Wolf, female    DOB: 03/17/1975, 47 y.o.   MRN: [id number]  Patient Care Team: Stacy Wolf, Stacy Radon, MD as PCP - General (Family Medicine)   Chief Complaint:  Belepharitis  HPI: Stacy Wolf is a 47 y.o. female presenting on 01/24/2023 for Belepharitis Sunday went to work and her eye burning. She reports that she has pain above her eye in her frontal head. Describes pain as throbbing. She was sent home last night from work due to swelling and pain in her eye. She has been trying warm compresses. Reports that it is very painful to touch. Endorses draining, eye crusting in the morning. States that draining will blur her vision. Denies fever, cough, congestion. Denies eye movement pain. Denies vision changes.   Relevant past medical, surgical, family, and social history reviewed and updated as indicated.  Allergies and medications reviewed and updated. Data reviewed: Chart in Epic.   Past Medical History:  Diagnosis Date   Anxiety    on meds   Bipolar 1 disorder (HCC)    Blood transfusion without reported diagnosis 2000   Depression    on meds   GERD (gastroesophageal reflux disease)    on meds   Glaucoma    LEFT eye- not a surgical candidate at this time (02/06/2021)   Heart murmur    History of kidney stones    Hypertension    on meds   Migraines    Schizophrenia (HCC)    Seasonal allergies     Past Surgical History:  Procedure Laterality Date   BILATERAL SALPINGECTOMY Bilateral 08/29/2016   Procedure: BILATERAL SALPINGECTOMY;  Surgeon: Stacy Arms, MD;  Location: [facility];  Service: Gynecology;  Laterality: Bilateral;   CARPAL TUNNEL RELEASE Left 04/11/2016   Procedure: CARPAL TUNNEL RELEASE;  Surgeon: Stacy Hearing, MD;  Location: [facility];  Service: Orthopedics;  Laterality: Left;   CARPAL TUNNEL RELEASE Right 03/13/2018   Procedure: RIGHT CARPAL TUNNEL RELEASE;  Surgeon: Stacy Hearing, MD;  Location: [facility];  Service:  Orthopedics;  Laterality: Right;   KNEE ARTHROSCOPY WITH LATERAL MENISECTOMY Right 09/19/2021   Procedure: KNEE ARTHROSCOPY WITH LATERAL MENISCECTOMY;  Surgeon: Stacy Hearing, MD;  Location: [facility];  Service: Orthopedics;  Laterality: Right;   SUPRACERVICAL ABDOMINAL HYSTERECTOMY N/A 08/29/2016   Procedure: HYSTERECTOMY SUPRACERVICAL ABDOMINAL;  Surgeon: Stacy Arms, MD;  Location: [facility];  Service: Gynecology;  Laterality: N/A;   TRIGGER FINGER RELEASE Right 11/18/2018   Procedure: RELEASE TRIGGER FINGER/A-1 PULLEY right ring;  Surgeon: Stacy Hearing, MD;  Location: [facility];  Service: Orthopedics;  Laterality: Right;   TUBAL LIGATION     WISDOM TOOTH EXTRACTION     WRIST SURGERY Right 2006   tendonitis    Social History   Socioeconomic History   Marital status: Divorced    Spouse name: Not on file   Number of children: 3   Years of education: Not on file   Highest education level: 12th grade  Occupational History   Not on file  Tobacco Use   Smoking status: Every Day    Current packs/day: 2.00    Average packs/day: 2.0 packs/day for 7.0 years (14.0 ttl pk-yrs)    Types: Cigars, Cigarettes   Smokeless tobacco: Never   Tobacco comments:    1 cigar per day  Vaping Use   Vaping status: Never Used  Substance and Sexual Activity   Alcohol use: Not Currently  Alcohol/week: 0.0 - 1.0 standard drinks of alcohol    Comment: occasionally   Drug use: Yes    Frequency: 4.0 times per week    Types: Marijuana    Comment: 09/15/21   Sexual activity: Yes    Birth control/protection: Surgical    Comment: supracervical hyst  Other Topics Concern   Not on file  Social History Narrative   Not on file   Social Determinants of Health   Financial Resource Strain: Low Risk  (10/30/2022)   Overall Financial Resource Strain (CARDIA)    Difficulty of Paying Living Expenses: Not very hard  Food Insecurity: Food Insecurity Present (10/30/2022)   Hunger Vital Sign    Worried  About Running Out of Food in the Last Year: Often true    Ran Out of Food in the Last Year: Sometimes true  Transportation Needs: No Transportation Needs (10/30/2022)   PRAPARE - Administrator, Civil Service (Medical): No    Lack of Transportation (Non-Medical): No  Physical Activity: Sufficiently Active (10/30/2022)   Exercise Vital Sign    Days of Exercise per Week: 5 days    Minutes of Exercise per Session: 30 min  Stress: Stress Concern Present (10/30/2022)   Stacy Wolf of Occupational Health - Occupational Stress Questionnaire    Feeling of Stress : Rather much  Social Connections: Unknown (10/30/2022)   Social Connection and Isolation Panel [NHANES]    Frequency of Communication with Friends and Family: More than three times a week    Frequency of Social Gatherings with Friends and Family: Never    Attends Religious Services: Patient declined    Database administrator or Organizations: No    Attends Engineer, structural: Not on file    Marital Status: Living with partner  Intimate Partner Violence: Not At Risk (04/25/2017)   Humiliation, Afraid, Rape, and Kick questionnaire    Fear of Current or Ex-Partner: No    Emotionally Abused: No    Physically Abused: No    Sexually Abused: No    Outpatient Encounter Medications as of 01/24/2023  Medication Sig   amLODipine (NORVASC) 5 MG tablet Take 1 tablet (5 mg total) by mouth daily.   aspirin-acetaminophen-caffeine (EXCEDRIN MIGRAINE) 250-250-65 MG tablet Take 2 tablets by mouth every 6 (six) hours as needed for headache or migraine.    estradiol (ESTRACE) 1 MG tablet Take 1 tablet (1 mg total) by mouth daily.   fluticasone (FLONASE) 50 MCG/ACT nasal spray Place 1 spray into both nostrils 2 (two) times daily as needed for allergies or rhinitis.   gabapentin (NEURONTIN) 100 MG capsule Take 1 capsule (100 mg total) by mouth 3 (three) times daily.   ibuprofen (ADVIL) 800 MG tablet TAKE 1 TABLET BY MOUTH EVERY 8  HOURS AS NEEDED   lansoprazole (PREVACID SOLUTAB) 15 MG disintegrating tablet Take by mouth.   lisinopril (ZESTRIL) 5 MG tablet Take 1 tablet (5 mg total) by mouth daily.   methocarbamol (ROBAXIN) 500 MG tablet Take 1 tablet (500 mg total) by mouth 3 (three) times daily.   [DISCONTINUED] pantoprazole (PROTONIX) 20 MG tablet Take 20 mg by mouth daily.   No facility-administered encounter medications on file as of 01/24/2023.    Allergies  Allergen Reactions   Imitrex [Sumatriptan] Anaphylaxis and Other (See Comments)    Could not swallow after taking med   Latuda [Lurasidone Hcl] Other (See Comments)    Suicidal thoughts    Wellbutrin [Bupropion] Other (See Comments)  Seizures / lowered seizure threshold   Zoloft [Sertraline Hcl] Hives   Xanax [Alprazolam] Other (See Comments)    Passed out   Haloperidol And Related Other (See Comments)    Leg cramps   Nicoderm [Nicotine] Dermatitis    Review of Systems As per HPI Objective:  BP 112/68   Pulse 60   Temp 98.3 F (36.8 C)   Ht 5\' 3"  (1.6 m)   Wt 169 lb (76.7 kg)   LMP 08/10/2016 Comment: SCH  SpO2 96%   BMI 29.94 kg/m    Wt Readings from Last 3 Encounters:  01/24/23 169 lb (76.7 kg)  11/12/22 175 lb (79.4 kg)  10/31/22 170 lb 12.8 oz (77.5 kg)   Physical Exam Constitutional:      General: She is awake. She is not in acute distress.    Appearance: Normal appearance. She is well-developed and well-groomed. She is not ill-appearing, toxic-appearing or diaphoretic.  Eyes:      Comments: Erythematous, edematous right upper eyelid, no hordeolum present. Very tender to touch   Cardiovascular:     Rate and Rhythm: Normal rate and regular rhythm.     Pulses: Normal pulses.          Radial pulses are 2+ on the right side and 2+ on the left side.       Posterior tibial pulses are 2+ on the right side and 2+ on the left side.     Heart sounds: Normal heart sounds. No murmur heard.    No gallop.  Pulmonary:     Effort:  Pulmonary effort is normal. No respiratory distress.     Breath sounds: Normal breath sounds. No stridor. No wheezing, rhonchi or rales.  Musculoskeletal:     Cervical back: Full passive range of motion without pain and neck supple.     Right lower leg: No edema.     Left lower leg: No edema.  Skin:    General: Skin is warm.     Capillary Refill: Capillary refill takes less than 2 seconds.  Neurological:     General: No focal deficit present.     Mental Status: She is alert, oriented to person, place, and time and easily aroused. Mental status is at baseline.     GCS: GCS eye subscore is 4. GCS verbal subscore is 5. GCS motor subscore is 6.     Motor: No weakness.  Psychiatric:        Attention and Perception: Attention and perception normal.        Mood and Affect: Mood and affect normal.        Speech: Speech normal.        Behavior: Behavior normal. Behavior is cooperative.        Thought Content: Thought content normal. Thought content does not include homicidal or suicidal ideation. Thought content does not include homicidal or suicidal plan.        Cognition and Memory: Cognition and memory normal.        Judgment: Judgment normal.     Results for orders placed or performed in visit on 11/12/22  CBC with Differential/Platelet  Result Value Ref Range   WBC 8.7 3.4 - 10.8 x10E3/uL   RBC 3.89 3.77 - 5.28 x10E6/uL   Hemoglobin 12.5 11.1 - 15.9 g/dL   Hematocrit 82.9 56.2 - 46.6 %   MCV 97 79 - 97 fL   MCH 32.1 26.6 - 33.0 pg   MCHC 33.2 31.5 - 35.7 g/dL  RDW 13.3 11.7 - 15.4 %   Platelets 238 150 - 450 x10E3/uL   Neutrophils 42 Not Estab. %   Lymphs 50 Not Estab. %   Monocytes 5 Not Estab. %   Eos 2 Not Estab. %   Basos 1 Not Estab. %   Neutrophils Absolute 3.7 1.4 - 7.0 x10E3/uL   Lymphocytes Absolute 4.3 (H) 0.7 - 3.1 x10E3/uL   Monocytes Absolute 0.4 0.1 - 0.9 x10E3/uL   EOS (ABSOLUTE) 0.2 0.0 - 0.4 x10E3/uL   Basophils Absolute 0.0 0.0 - 0.2 x10E3/uL   Immature  Granulocytes 0 Not Estab. %   Immature Grans (Abs) 0.0 0.0 - 0.1 x10E3/uL  CMP14+EGFR  Result Value Ref Range   Glucose 91 70 - 99 mg/dL   BUN 10 6 - 24 mg/dL   Creatinine, Ser 1.61 0.57 - 1.00 mg/dL   eGFR 84 >09 UE/AVW/0.98   BUN/Creatinine Ratio 12 9 - 23   Sodium 142 134 - 144 mmol/L   Potassium 3.8 3.5 - 5.2 mmol/L   Chloride 106 96 - 106 mmol/L   CO2 23 20 - 29 mmol/L   Calcium 9.2 8.7 - 10.2 mg/dL   Total Protein 6.3 6.0 - 8.5 g/dL   Albumin 4.2 3.9 - 4.9 g/dL   Globulin, Total 2.1 1.5 - 4.5 g/dL   Bilirubin Total <1.1 0.0 - 1.2 mg/dL   Alkaline Phosphatase 99 44 - 121 IU/L   AST 16 0 - 40 IU/L   ALT 9 0 - 32 IU/L  Lipid panel  Result Value Ref Range   Cholesterol, Total 159 100 - 199 mg/dL   Triglycerides 914 0 - 149 mg/dL   HDL 47 >78 mg/dL   VLDL Cholesterol Cal 25 5 - 40 mg/dL   LDL Chol Calc (NIH) 87 0 - 99 mg/dL   Chol/HDL Ratio 3.4 0.0 - 4.4 ratio  RPR  Result Value Ref Range   RPR Ser Ql Non Reactive Non Reactive  HepB+HepC+HIV Panel  Result Value Ref Range   Hepatitis B Surface Ag Negative Negative   Hep B E Ag Negative Negative   Hep B C IgM Negative Negative   Hep B Core Total Ab Negative Negative   Hep B E Ab Non Reactive Negative   Hep B Surface Ab, Qual Reactive    Hep C Virus Ab Non Reactive Non Reactive   HIV Screen 4th Generation wRfx Non Reactive Non Reactive  Cytology - PAP([place])  Result Value Ref Range   Neisseria Gonorrhea Negative    Chlamydia Negative    Adequacy      Satisfactory for evaluation; transformation zone component ABSENT.   Diagnosis      - Negative for intraepithelial lesion or malignancy (NILM)   Comment Normal Reference Ranger Chlamydia - Negative    Comment      Normal Reference Range Neisseria Gonorrhea - Negative       01/24/2023   10:34 AM 11/12/2022    9:35 AM 10/31/2022    8:40 AM 08/10/2022   11:02 AM 03/08/2022    2:41 PM  Depression screen PHQ 2/9  Decreased Interest 0 0 0 0 0  Down, Depressed,  Hopeless 0 0 0 0 0  PHQ - 2 Score 0 0 0 0 0  Altered sleeping 3 3 3 3  0  Tired, decreased energy 3 2 0 3 0  Change in appetite 0 0 0 0 0  Feeling bad or failure about yourself  0 0 0 0 0  Trouble concentrating 0 0 0 0 0  Moving slowly or fidgety/restless 0 0 0 0 0  Suicidal thoughts 0 0 0 0 0  PHQ-9 Score 6 5 3 6  0  Difficult doing work/chores Not difficult at all Not difficult at all Not difficult at all Not difficult at all Not difficult at all       01/24/2023   10:36 AM 11/12/2022    9:35 AM 10/31/2022    8:40 AM 08/10/2022   11:02 AM  GAD 7 : Generalized Anxiety Score  Nervous, Anxious, on Edge 3 2 3 3   Control/stop worrying 0 2 3 0  Worry too much - different things 0 2 3 3   Trouble relaxing 0 0 1 0  Restless 0 0 3 0  Easily annoyed or irritable 3 2 3 3   Afraid - awful might happen 0 0 0 3  Total GAD 7 Score 6 8 16 12   Anxiety Difficulty  Not difficult at all Very difficult Somewhat difficult    Pertinent labs & imaging results that were available during my care of the patient were reviewed by me and considered in my medical decision making.  Assessment & Plan:  Lacole was seen today for belepharitis.  Diagnoses and all orders for this visit:  Swelling of right upper eyelid Will start medication as below to cover for preseptal cellutiis as patient has significant pain and has not improved with warm compresses. Patient to continue compresses and artificial tears.  -     amoxicillin-clavulanate (AUGMENTIN) 875-125 MG tablet; Take 1 tablet by mouth 2 (two) times daily for 7 days.  Encounter for screening for depression Positive screenings for anxiety and depression today. Symptoms stable. Patient to follow up with PCP.   Continue all other maintenance medications.  Follow up plan: Return if symptoms worsen or fail to improve.   Continue healthy lifestyle choices, including diet (rich in fruits, vegetables, and lean proteins, and low in salt and simple carbohydrates)  and exercise (at least 30 minutes of moderate physical activity daily).  Written and verbal instructions provided   The above assessment and management plan was discussed with the patient. The patient verbalized understanding of and has agreed to the management plan. Patient is aware to call the clinic if they develop any new symptoms or if symptoms persist or worsen. Patient is aware when to return to the clinic for a follow-up visit. Patient educated on when it is appropriate to go to the emergency department.   Neale Burly, DNP-FNP [facility] [address] [place], [place] [address] [phone]

## 2023-02-08 ENCOUNTER — Ambulatory Visit: Payer: Self-pay | Admitting: Family Medicine

## 2023-02-08 ENCOUNTER — Ambulatory Visit (INDEPENDENT_AMBULATORY_CARE_PROVIDER_SITE_OTHER): Payer: Medicaid Other | Admitting: Nurse Practitioner

## 2023-02-08 ENCOUNTER — Other Ambulatory Visit: Payer: Self-pay | Admitting: Family Medicine

## 2023-02-08 ENCOUNTER — Encounter: Payer: Self-pay | Admitting: Nurse Practitioner

## 2023-02-08 VITALS — BP 122/76 | HR 60 | Temp 97.7°F | Resp 20 | Ht 63.0 in | Wt 168.0 lb

## 2023-02-08 DIAGNOSIS — H60332 Swimmer's ear, left ear: Secondary | ICD-10-CM | POA: Diagnosis not present

## 2023-02-08 MED ORDER — CIPRO HC 0.2-1 % OT SUSP: 3.0000 [drp] | Freq: Two times a day (BID) | OTIC | 0 refills | Status: DC

## 2023-02-08 NOTE — Telephone Encounter (Signed)
Copied from CRM [id number]. Topic: Clinical - Red Word Triage >> Feb 08, 2023 10:55 AM Fuller Mandril wrote: Red Word that prompted transfer to Nurse Triage: Dizziness, headache from ear ache almost passed out - had to leave work   Chief Complaint: Dizziness and headache Symptoms: Dizziness, headache, earache, last night vomiting Frequency: Intermittent Pertinent Negatives: Patient denies hx of diabetes, fever, blurred vision, chest pain, SOB, racing heart, clammy skin, falls from dizziness, weakness, numbness Disposition: [] ED /[] Urgent Care (no appt availability in office) / [x] Appointment(In office/virtual)/ []  [facility]/ [] Home Care/ [] Refused Recommended Disposition /[] [facility]/ []  Follow-up with PCP Additional Notes: Pt reporting having to leave work for dizziness, earache, and headache. Pt reporting she works 12 hr night shifts, pt had been vomiting before last night's shift due to dizziness, pt "almost passed out" at work. Pt came home, took her daily dose of BP meds, ate food, and fell asleep. Pt reporting that she is still feeling symptoms. Pt reporting that she has had these symptoms on and off for the past 3 days, the symptoms go away with Excedrin med, pt also been putting "oil" in her ears for the earache. Pt reporting that the dizziness feels like "a little spinning, as if under the influence" of alcohol. Pt reporting with headache, she "feels it in back of head, feels like brain was shook and was swimming." Pt reporting her dizziness is worse when walking around, dizziness occurs when headache is present. Pt confirms that she is able to walk normally. Pt took her BP intermittently while on phone with nurse per nurse request, first reading was 80/51 but the next minute was 144/91 with 70 bpm. Nurse had pt take deep breaths and wait 5 min before next reading which was 136/73 with 64 bpm. Pt reporting her normal BP is around 119/50s. Pt denies blurred vision, weakness,  chest pain, SOB, fever, racing heart, clammy skin, numbness, or falls from dizziness. Pt confirms hx of migraine, but reporting that her other migraines do not feel this way. Pt reporting she has not taken Excedrin for symptoms today. Advised pt take Excedrin as she has for improvement of symptoms and to call office if worsening or med not helping or call 911 or go to ED if severely worsening or new symptoms. Pt is speaking in full coherent sentences. Scheduled appt for today with PCP office.  Reason for Disposition  [1] MODERATE dizziness (e.g., interferes with normal activities) AND [2] has NOT been evaluated by doctor (or NP/PA) for this  (Exception: Dizziness caused by heat exposure, sudden standing, or poor fluid intake.)  Answer Assessment - Initial Assessment Questions 1. DESCRIPTION: "Describe your dizziness."     Pt reporting dizziness feels like "a little spinning, as if under the influence" of alcohol. 2. LIGHTHEADED: "Do you feel lightheaded?" (e.g., somewhat faint, woozy, weak upon standing)     Dizzy and lightheaded with walking and activity 3. VERTIGO: "Do you feel like either you or the room is spinning or tilting?" (i.e. vertigo)     "A little spinning" 4. SEVERITY: "How bad is it?"  "Do you feel like you are going to faint?" "Can you stand and walk?"   - MILD: Feels slightly dizzy, but walking normally.   - MODERATE: Feels unsteady when walking, but not falling; interferes with normal activities (e.g., school, work).   - SEVERE: Unable to walk without falling, or requires assistance to walk without falling; feels like passing out now.  Felt like was going to pass out at work so came home, still dizzy but not feeling faint 5. ONSET:  "When did the dizziness begin?"     3 days ago 6. AGGRAVATING FACTORS: "Does anything make it worse?" (e.g., standing, change in head position)     Dizziness comes with headache, worse when walking or up doing activities 7. HEART RATE: "Can you  tell me your heart rate?" "How many beats in 15 seconds?"  (Note: not all patients can do this)       HR was 70 bpm and 64 bpm 5 min apart, BP readings on home automatic BP cuff were 80/51 then the next minute 144/91, then 5 min later 136/73. 8. CAUSE: "What do you think is causing the dizziness?"     Pt is unsure but knows the dizziness and headache come together 10. OTHER SYMPTOMS: "Do you have any other symptoms?" (e.g., fever, chest pain, vomiting, diarrhea, bleeding)       Headache, vomiting last night due to dizziness and headache, intermittently feeling faint, earache  Protocols used: Dizziness - Lightheadedness-A-AH

## 2023-02-08 NOTE — Progress Notes (Signed)
   Subjective:    Patient ID: Stacy Wolf, female    DOB: 03-Dec-1975, 47 y.o.   MRN: [id number]   Chief Complaint: Ear Pain (Left ear/) and Dizziness   Otalgia  There is pain in both ears. This is a new problem. The current episode started in the past 7 days. The problem occurs constantly. The problem has been waxing and waning. There has been no fever. The pain is at a severity of 9/10. The pain is moderate. Pertinent negatives include no coughing, rhinorrhea or sore throat. Associated symptoms comments: dizziness. The treatment provided mild relief.    Patient Active Problem List   Diagnosis Date Noted   Acute cystitis without hematuria 10/31/2022   Abdominal cramping 10/31/2022   Meniscus, lateral, derangement, right    Status post partial hysterectomy 08/19/2020   Current use of estrogen therapy 01/20/2019   Posttraumatic stress disorder 10/17/2016   Personality disorder (HCC) 10/17/2016   Menorrhagia with irregular cycle 08/13/2016   Obesity (BMI 30.0-34.9) 04/23/2016   Essential hypertension, benign 01/16/2016   Migraine headache 04/18/2015   Tobacco abuse 02/02/2015   Gastroesophageal reflux disease without esophagitis 08/24/2014   Hyperlipidemia LDL goal <100 09/14/2013   Bipolar disorder (HCC) 08/25/2013       Review of Systems  HENT:  Positive for ear pain. Negative for rhinorrhea and sore throat.   Respiratory:  Negative for cough.        Objective:   Physical Exam Vitals reviewed.  Constitutional:      Appearance: Normal appearance.  HENT:     Right Ear: Tympanic membrane normal.     Left Ear: Tympanic membrane is not erythematous.     Ears:     Comments: Ear canal erythematous and sore to touch    Nose: No congestion or rhinorrhea.  Cardiovascular:     Rate and Rhythm: Normal rate and regular rhythm.     Heart sounds: Normal heart sounds.  Pulmonary:     Breath sounds: Normal breath sounds.  Neurological:     General: No focal deficit present.      Mental Status: She is alert and oriented to person, place, and time.  Psychiatric:        Mood and Affect: Mood normal.        Behavior: Behavior normal.     BP 122/76   Pulse 60   Temp 97.7 F (36.5 C) (Temporal)   Resp 20   Ht 5\' 3"  (1.6 m)   Wt 168 lb (76.2 kg)   LMP 08/10/2016 Comment: SCH  SpO2 97%   BMI 29.76 kg/m        Assessment & Plan:   Stacy Wolf in today with chief complaint of Ear Pain (Left ear/) and Dizziness   1. Acute swimmer's ear of left side Mortrin OTC every 6 hours RTO prn - ciprofloxacin-hydrocortisone (CIPRO HC) OTIC suspension; Place 3 drops into the left ear 2 (two) times daily.  Dispense: 10 mL; Refill: 0    The above assessment and management plan was discussed with the patient. The patient verbalized understanding of and has agreed to the management plan. Patient is aware to call the clinic if symptoms persist or worsen. Patient is aware when to return to the clinic for a follow-up visit. Patient educated on when it is appropriate to go to the emergency department.   Mary-Margaret Daphine Deutscher, FNP

## 2023-02-08 NOTE — Patient Instructions (Signed)
Otitis Externa  Otitis externa is an infection of the outer ear canal. The outer ear canal is the area between the outside of the ear and the eardrum. Otitis externa is sometimes called swimmer's ear. What are the causes? Common causes of this condition include: Swimming in dirty water. Moisture in the ear. An injury to the inside of the ear. An object stuck in the ear. A cut or scrape on the outside of the ear or in the ear canal. What increases the risk? You are more likely to develop this condition if you go swimming often. What are the signs or symptoms? The first symptom of this condition is often itching in the ear. Later symptoms of the condition include: Swelling of the ear. Redness in the ear. Ear pain. The pain may get worse when you pull on your ear. Pus coming from the ear. How is this diagnosed? This condition may be diagnosed by examining the ear and testing fluid from the ear for bacteria and funguses. How is this treated? This condition may be treated with: Antibiotic ear drops. These are often given for 10-14 days. Medicines to reduce itching and swelling. Follow these instructions at home: If you were prescribed antibiotic ear drops, use them as told by your health care provider. Do not stop using the antibiotic even if you start to feel better. Take over-the-counter and prescription medicines only as told by your health care provider. Avoid getting water in your ears as told by your health care provider. This may include avoiding swimming or water sports for a few days. Keep all follow-up visits. This is important. How is this prevented? Keep your ears dry. Use the corner of a towel to dry your ears after you swim or bathe. Avoid scratching or putting things in your ear. Doing these things can damage the ear canal or remove the protective wax that lines it, which makes it easier for bacteria and funguses to grow. Avoid swimming in lakes, polluted water, or swimming  pools that may not have enough chlorine. Contact a health care provider if: You have a fever. Your ear is still red, swollen, painful, or draining pus after 3 days. Your redness, swelling, or pain gets worse. You have a severe headache. Get help right away if: You have redness, swelling, and pain or tenderness in the area behind your ear. Summary Otitis externa is an infection of the outer ear canal. Common causes include swimming in dirty water, moisture in the ear, or a cut or scrape in the ear. Symptoms include pain, redness, and swelling of the ear canal. If you were prescribed antibiotic ear drops, use them as told by your health care provider. Do not stop using the antibiotic even if you start to feel better. This information is not intended to replace advice given to you by your health care provider. Make sure you discuss any questions you have with your health care provider. Document Revised: 05/04/2020 Document Reviewed: 05/04/2020 Elsevier Patient Education  2024 [employer] Inc.  

## 2023-02-11 ENCOUNTER — Telehealth: Payer: Self-pay | Admitting: Family Medicine

## 2023-02-11 MED ORDER — NEOMYCIN-POLYMYXIN-HC 3.5-10000-1 OT SOLN: 3.0000 [drp] | Freq: Four times a day (QID) | OTIC | 0 refills | Status: DC

## 2023-02-11 NOTE — Telephone Encounter (Signed)
Copied from CRM [id number]. Topic: Clinical - Medication Question >> Feb 11, 2023  3:33 PM Stacy Wolf wrote: Reason for CRM: Patient called in stating insurance doesn't cover ciprofloxacin-hydrocortisone (CIPRO HC) OTIC suspension so she would like to know if she could receive something else

## 2023-03-12 ENCOUNTER — Telehealth: Payer: Self-pay

## 2023-03-12 ENCOUNTER — Encounter: Payer: Self-pay | Admitting: Nurse Practitioner

## 2023-03-12 ENCOUNTER — Ambulatory Visit: Payer: Medicaid Other | Admitting: Nurse Practitioner

## 2023-03-12 VITALS — BP 111/69 | HR 75 | Temp 97.6°F | Wt 165.6 lb

## 2023-03-12 DIAGNOSIS — M545 Low back pain, unspecified: Secondary | ICD-10-CM | POA: Diagnosis not present

## 2023-03-12 MED ORDER — TIZANIDINE HCL 2 MG PO CAPS: 2.0000 mg | ORAL_CAPSULE | Freq: Three times a day (TID) | ORAL | 0 refills | Status: DC | PRN

## 2023-03-12 MED ORDER — TIZANIDINE HCL 2 MG PO TABS: 2.0000 mg | ORAL_TABLET | Freq: Three times a day (TID) | ORAL | 0 refills | Status: DC | PRN

## 2023-03-12 NOTE — Addendum Note (Signed)
 Addended by: Martina Sinner on: 03/12/2023 04:27 PM   Modules accepted: Orders

## 2023-03-12 NOTE — Telephone Encounter (Signed)
 Tizanidine capsule is not covered by patient's insurance, must be prescribed the tablet.

## 2023-03-12 NOTE — Progress Notes (Signed)
 Acute Office Visit  Subjective:     Patient ID: Stacy Wolf, female    DOB: April 15, 1975, 48 y.o.   MRN: [id number]  Chief Complaint  Patient presents with   Back Pain    Back pain from top of back to middle of back   Stacy Wolf is 48 yrs  female presents 03/12/23 c/o of back pain for e weeks. Reported history of sciatic nerve pain in the past and recent right knee surgery Back Pain: Patient presents for presents evaluation of low back problems.  Symptoms have been present for 2 weeks and include  none . Initial inciting event:   was sitting down watching TV, when I got up thought I was having back spasm or sciatica  . Hx of sciatic nerve pain  it coe and goes Symptoms are worst: nighttime. Alleviating factors identifiable by patient are  back massage . Exacerbating factors identifiable by patient are  work works at [employer] . Treatments so far initiated by patient:  Biofreeze minimal help  Previous lower back problems:  sciatic nerve pain in 2023 . Previous workup: none. Previous treatments:  muscle relaxer .  Knee surgery to repair torn meniscus July 2023  LMP 2018, hx of hyterectomy   Active Ambulatory Problems    Diagnosis Date Noted   Bipolar disorder (HCC) 08/25/2013   Hyperlipidemia LDL goal <100 09/14/2013   Gastroesophageal reflux disease without esophagitis 08/24/2014   Tobacco abuse 02/02/2015   Migraine headache 04/18/2015   Essential hypertension, benign 01/16/2016   Obesity (BMI 30.0-34.9) 04/23/2016   Menorrhagia with irregular cycle 08/13/2016   Posttraumatic stress disorder 10/17/2016   Personality disorder (HCC) 10/17/2016   Current use of estrogen therapy 01/20/2019   Status post partial hysterectomy 08/19/2020   Meniscus, lateral, derangement, right    Acute cystitis without hematuria 10/31/2022   Abdominal cramping 10/31/2022   Acute left-sided low back pain without sciatica 03/12/2023   Resolved Ambulatory Problems    Diagnosis Date  Noted   Constipation 02/02/2015   Carpal tunnel syndrome, left upper limb    Metabolic syndrome 04/23/2016   Status post trigger finger release 08/29/2016   Conduct disorder 10/17/2016   Carpal tunnel syndrome of right wrist    S/P carpal tunnel release right 03/13/18 03/28/2018   Trigger finger, acquired 04/28/2018   Screening for colorectal cancer 01/20/2019   Encounter for gynecological examination with Papanicolaou smear of cervix 01/20/2019   Routine medical exam 01/20/2019   Pain with urination 01/20/2019   Routine cervical smear 01/20/2019   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  Respiratory:  Negative for cough, shortness of breath and wheezing.   Gastrointestinal:  Negative for constipation, diarrhea, nausea and vomiting.  Genitourinary:  Negative for dysuria, flank pain, frequency and urgency.  Musculoskeletal:  Positive for back pain. Negative for falls.       No paresthesia, non radiating 8/10 pain aching that is relieve with massage  Skin:  Negative for itching  and rash.  Neurological:  Negative for dizziness and headaches.  Endo/Heme/Allergies:  Negative for environmental allergies and polydipsia. Does not bruise/bleed easily.  Psychiatric/Behavioral:  Negative for hallucinations and suicidal ideas.    Negative unless indicated in HPI    Objective:    BP 111/69   Pulse 75   Temp 97.6 F (36.4 C) (Temporal)   Wt 165 lb 9.6 oz (75.1 kg)   LMP 08/10/2016 Comment: SCH  SpO2 97%   BMI 29.33 kg/m  BP Readings from Last 3 Encounters:  03/12/23 111/69   02/08/23 122/76  01/24/23 112/68   Wt Readings from Last 3 Encounters:  03/12/23 165 lb 9.6 oz (75.1 kg)  02/08/23 168 lb (76.2 kg)  01/24/23 169 lb (76.7 kg)      Physical Exam Constitutional:      General: She is not in acute distress. HENT:     Head: Normocephalic and atraumatic.     Nose: Nose normal.     Mouth/Throat:     Mouth: Mucous membranes are moist.  Eyes:     General: No scleral icterus.    Extraocular Movements: Extraocular movements intact.     Conjunctiva/sclera: Conjunctivae normal.     Pupils: Pupils are equal, round, and reactive to light.  Neck:     Vascular: No carotid bruit.  Cardiovascular:     Rate and Rhythm: Normal rate and regular rhythm.  Pulmonary:     Effort: Pulmonary effort is normal.     Breath sounds: Normal breath sounds.  Musculoskeletal:        General: Normal range of motion.     Cervical back: Normal, normal range of motion and neck supple. No rigidity or tenderness.     Thoracic back: Normal.     Lumbar back: No edema or spasms. Normal range of motion.     Right lower leg: No edema.     Left lower leg: No edema.  Lymphadenopathy:     Cervical: No cervical adenopathy.  Skin:    General: Skin is warm and dry.     Capillary Refill: Capillary refill takes less than 2 seconds.     Findings: No rash.  Neurological:     Mental Status: She is alert and oriented to person, place, and time. Mental status is at baseline.     Gait: Gait is intact.  Psychiatric:        Mood and Affect: Mood normal.        Behavior: Behavior normal.        Thought Content: Thought content normal.        Judgment: Judgment normal.     No results found for any visits on 03/12/23.      Assessment & Plan:  Acute left-sided low back pain without sciatica -     tiZANidine  HCl; Take 1 capsule (2 mg total) by mouth 3 (three) times daily as needed for muscle spasms.  Dispense: 30 capsule; Refill: 0  Stacy Wolf is 48 yrs old African American female seen  today for back pain , no acute distress Back pain: Zanaflex  2 mg TID PRN; heat pad use for 15 mins; use a floor mat at work possible back x-ray if not improve Return if symptoms worsen or fail to improve.  Encourage healthy lifestyle choices, including diet (rich in fruits, vegetables, and lean proteins, and low in salt and simple carbohydrates) and exercise (at least 30 minutes of moderate physical activity daily).     The above assessment and  management plan was discussed with the patient. The patient verbalized understanding of and has agreed to the management plan. Patient is aware to call the clinic if they develop any new symptoms or if symptoms persist or worsen. Patient is aware when to return to the clinic for a follow-up visit. Patient educated on when it is appropriate to go to the emergency department.   Stacy Figg [place] Thompson, DNP [facility] [address] [place], [place] [address] [phone] Note: This document was prepared by [employer] voice dictation technology and any errors that results from this process are unintentional.

## 2023-04-01 ENCOUNTER — Other Ambulatory Visit: Payer: Self-pay | Admitting: Family Medicine

## 2023-04-01 ENCOUNTER — Encounter (HOSPITAL_COMMUNITY): Payer: Self-pay

## 2023-04-01 ENCOUNTER — Other Ambulatory Visit: Payer: Self-pay

## 2023-04-01 ENCOUNTER — Emergency Department (HOSPITAL_COMMUNITY)
Admission: EM | Admit: 2023-04-01 | Discharge: 2023-04-01 | Disposition: A | Discharge status: Home / Self Care | Payer: Medicaid Other | Attending: Emergency Medicine | Admitting: Emergency Medicine

## 2023-04-01 ENCOUNTER — Ambulatory Visit: Payer: Self-pay | Admitting: Family Medicine

## 2023-04-01 DIAGNOSIS — I10 Essential (primary) hypertension: Secondary | ICD-10-CM

## 2023-04-01 DIAGNOSIS — H5789 Other specified disorders of eye and adnexa: Secondary | ICD-10-CM | POA: Diagnosis present

## 2023-04-01 DIAGNOSIS — M25551 Pain in right hip: Secondary | ICD-10-CM | POA: Insufficient documentation

## 2023-04-01 DIAGNOSIS — H0011 Chalazion right upper eyelid: Secondary | ICD-10-CM

## 2023-04-01 MED ORDER — AMOXICILLIN-POT CLAVULANATE 875-125 MG PO TABS: 1.0000 | ORAL_TABLET | Freq: Two times a day (BID) | ORAL | 0 refills | Status: DC

## 2023-04-01 MED ORDER — TETRACAINE HCL 0.5 % OP SOLN
2.0000 [drp] | Freq: Once | OPHTHALMIC | Status: AC
  Administered 2023-04-01: 2 [drp] via OPHTHALMIC
  Filled 2023-04-01: qty 4

## 2023-04-01 MED ORDER — FLUORESCEIN SODIUM 1 MG OP STRP
1.0000 | ORAL_STRIP | Freq: Once | OPHTHALMIC | Status: AC
  Administered 2023-04-01: 1 via OPHTHALMIC
  Filled 2023-04-01: qty 1

## 2023-04-01 NOTE — Discharge Instructions (Addendum)
As we discussed, your workup in the ER today was reassuring for acute findings.  I suspect your symptoms are due to something known as a chalazion.  I have attached additional information regarding this diagnosis to your paperwork, however basically it is a blocked oil gland in your eye the cause of swelling and pain.  The best treatment for this is warm compresses and pain control with Tylenol/ibuprofen.  Please use acetaminophen (Tylenol) or ibuprofen (Advil, Motrin) for pain.  You may use 800 mg ibuprofen every 6 hours or 1000 mg of acetaminophen every 6 hours.  You may choose to alternate between the two, this would be most effective. Do not exceed 4000 mg of acetaminophen within 24 hours.  Do not exceed 3200 mg ibuprofen within 24 hours.  However, given your previous improvement with antibiotics, will cover for same with Augmentin.  I have given you a prescription for this.  Please fill and take this as prescribed in its entirety.  Have also given you a referral to ophthalmology for close follow-up.  Given this is a recurrent issue, it is very important that you see an ophthalmologist.  Return if development of any new or worsening symptoms.

## 2023-04-01 NOTE — ED Triage Notes (Signed)
Pt arrived via POV from home c/o right eyelid swelling. Pt reports being seen previously for similar and was given an antibiotic. Pt reports calling PCP office and was advised to come to the ER for treatment.

## 2023-04-01 NOTE — ED Provider Notes (Signed)
[place] EMERGENCY DEPARTMENT AT [facility] Provider Note   CSN: [id number] Arrival date & time: 04/01/23  0901     History  Chief Complaint  Patient presents with   Eye Problem    Stacy Wolf is a 48 y.o. female.  Patient with noncontributory past medical history presents today with complaints of right hip pain.  She states that same began on Saturday with swelling of her right upper eyelid.  Symptoms been persistent since then.  She has tried warm compresses with minimal improvement.  States that she had a similar occurrence back in November 2024 and seen by her PCP and prescribed Augmentin, she states that is since she started the Augmentin her symptoms improved.  She presents requesting same.  She states that this swelling of her eyelid has somewhat obstructed her vision, however denies any other vision changes.  Denies any purulent drainage or crusting.  No fevers or chills.  No painful eye movements.  No concern for foreign body exposure.  She is not a contact lens wearer.  The history is provided by the patient. No language interpreter was used.  Eye Problem      Home Medications Prior to Admission medications   Medication Sig Start Date End Date Taking? Authorizing Provider  amLODipine (NORVASC) 5 MG tablet Take 1 tablet (5 mg total) by mouth daily. 02/21/22   Dettinger, Elige Radon, MD  aspirin-acetaminophen-caffeine (EXCEDRIN MIGRAINE) [phone] MG tablet Take 2 tablets by mouth every 6 (six) hours as needed for headache or migraine.     [provider]  ciprofloxacin-hydrocortisone (CIPRO HC) OTIC suspension Place 3 drops into the left ear 2 (two) times daily. 02/08/23   Daphine Deutscher, Mary-Margaret, FNP  estradiol (ESTRACE) 1 MG tablet Take 1 tablet (1 mg total) by mouth daily. 11/12/22   Dettinger, Elige Radon, MD  fluticasone (FLONASE) 50 MCG/ACT nasal spray Place 1 spray into both nostrils 2 (two) times daily as needed for allergies or rhinitis. 02/21/22    Dettinger, Elige Radon, MD  gabapentin (NEURONTIN) 100 MG capsule Take 1 capsule (100 mg total) by mouth 3 (three) times daily. 11/15/21   Vickki Hearing, MD  ibuprofen (ADVIL) 800 MG tablet TAKE 1 TABLET BY MOUTH EVERY 8 HOURS AS NEEDED 11/19/22   Vickki Hearing, MD  lansoprazole (PREVACID SOLUTAB) 15 MG disintegrating tablet Take by mouth.    [provider]  lisinopril (ZESTRIL) 5 MG tablet Take 1 tablet (5 mg total) by mouth daily. 02/21/22   Dettinger, Elige Radon, MD  methocarbamol (ROBAXIN) 500 MG tablet Take 1 tablet (500 mg total) by mouth 3 (three) times daily. 11/15/21   Vickki Hearing, MD  neomycin-polymyxin-hydrocortisone (CORTISPORIN) OTIC solution Place 3 drops into both ears 4 (four) times daily. 02/11/23   Daphine Deutscher, Mary-Margaret, FNP  tiZANidine (ZANAFLEX) 2 MG tablet Take 1 tablet (2 mg total) by mouth 3 (three) times daily as needed for muscle spasms (for back pain). 03/12/23   St Vena [place], NP      Allergies    Imitrex [sumatriptan], Kasandra Knudsen [lurasidone hcl], Wellbutrin [bupropion], Zoloft [sertraline hcl], Xanax [alprazolam], Haloperidol and related, and Nicoderm [nicotine]    Review of Systems   Review of Systems  All other systems reviewed and are negative.   Physical Exam Updated Vital Signs BP (!) 134/91 (BP Location: Right Arm)   Pulse 64   Temp 97.9 F (36.6 C) (Oral)   Resp 18   Ht 5\' 3"  (1.6 m)   Wt  76 kg   LMP 08/10/2016 Comment: SCH  SpO2 97%   BMI 29.68 kg/m  Physical Exam Vitals and nursing note reviewed.  Constitutional:      General: She is not in acute distress.    Appearance: Normal appearance. She is normal weight. She is not ill-appearing, toxic-appearing or diaphoretic.  HENT:     Head: Normocephalic and atraumatic.  Eyes:     General: Lids are everted, no foreign bodies appreciated. Vision grossly intact.     Extraocular Movements: Extraocular movements intact.     Conjunctiva/sclera:     Right eye: Right  conjunctiva is not injected. No chemosis, exudate or hemorrhage.    Comments: Swollen nodular lesion present to the right upper eyelid. No purulent drainage. No foreign body.  PERRLA, EOMs intact without pain.  No cellulitic skin changes.  Vision grossly intact.  No conjunctival erythema.  No proptosis  Left eye unremarkable  Cardiovascular:     Rate and Rhythm: Normal rate.  Pulmonary:     Effort: Pulmonary effort is normal. No respiratory distress.  Musculoskeletal:        General: Normal range of motion.     Cervical back: Normal range of motion.  Skin:    General: Skin is warm and dry.  Neurological:     General: No focal deficit present.     Mental Status: She is alert.  Psychiatric:        Mood and Affect: Mood normal.        Behavior: Behavior normal.     ED Results / Procedures / Treatments   Labs (all labs ordered are listed, but only abnormal results are displayed) Labs Reviewed - No data to display  EKG None  Radiology No results found.  Procedures Procedures    Medications Ordered in ED Medications  fluorescein ophthalmic strip 1 strip (1 strip Right Eye Given by Other 04/01/23 1005)  tetracaine (PONTOCAINE) 0.5 % ophthalmic solution 2 drop (2 drops Right Eye Given by Other 04/01/23 1006)    ED Course/ Medical Decision Making/ A&P                                 Medical Decision Making Risk Prescription drug management.   Patient presents today with complaints of right eyelid swelling/pain x 2 days.  She is afebrile, nontoxic-appearing, and in no acute distress with reassuring vital signs.  Physical exam reveals nodular lesion present within the right upper eyelid.  Vision grossly intact.  No cellulitic changes.  EOMs intact without pain.  No conjunctival abnormalities.  Exam non-concerning for orbital cellulitis, hyphema, corneal ulcers, corneal abrasions or trauma.  Symptoms consistent with chalazion.  Discussed same with patient is understanding  agreement with this.  However, she does note that she had a similar presentation back in November and was seen by her PCP and prescribed Augmentin.  She notes that she felt improvement after she started this medication.  Therefore will provide same.  Will also give referral to ophthalmology for follow-up. Evaluation and diagnostic testing in the emergency department does not suggest an emergent condition requiring admission or immediate intervention beyond what has been performed at this time.  Plan for discharge with close PCP follow-up.  Patient is understanding and amenable with plan, educated on red flag symptoms that would prompt immediate return.  Patient discharged in stable condition.  Final Clinical Impression(s) / ED Diagnoses Final diagnoses:  Chalazion of right  upper eyelid    Rx / DC Orders ED Discharge Orders          Ordered    amoxicillin-clavulanate (AUGMENTIN) 875-125 MG tablet  Every 12 hours        04/01/23 1017          An After Visit Summary was printed and given to the patient.     Vear Clock 04/01/23 1019    Benjiman Core, MD 04/02/23 [phone]

## 2023-04-01 NOTE — Telephone Encounter (Signed)
Copied from CRM [id number]. Topic: Clinical - Red Word Triage >> Apr 01, 2023  8:07 AM Fonda Kinder J wrote: [facility] that prompted transfer to Nurse Triage: Pts eye is in pain & swollen  Chief Complaint: right eye pain Symptoms: swollen, blurry vision, pain and numbness to right side of face Frequency: constant Pertinent Negatives: Patient denies fever, chills, eye discharge Disposition: [x] ED /[] Urgent Care (no appt availability in office) / [] Appointment(In office/virtual)/ []  [place] [facility]/ [] Home Care/ [] Refused Recommended Disposition /[] [place] [facility]/ []  Follow-up with PCP Additional Notes: states eye started to swell on Sat.  States had this before back in November.  States was told it was an infection.  States vision is blurry and pain is 10/10.  Instructed to go to ER.  Pcp office updated.   Reason for Disposition  SEVERE eye pain  Answer Assessment - Initial Assessment Questions 1. ONSET: "When did the pain start?" (e.g., minutes, hours, days)     Saturday 2. TIMING: "Does the pain come and go, or has it been constant since it started?" (e.g., constant, intermittent, fleeting)     constant 3. SEVERITY: "How bad is the pain?"   (Scale 1-10; mild, moderate or severe)   - MILD (1-3): doesn't interfere with normal activities    - MODERATE (4-7): interferes with normal activities or awakens from sleep    - SEVERE (8-10): excruciating pain and patient unable to do normal activities     10/10 4. LOCATION: "Where does it hurt?"  (e.g., eyelid, eye, cheekbone)     Right eye 5. CAUSE: "What do you think is causing the pain?"     infection 6. VISION: "Do you have blurred vision or changes in your vision?"      blurry 7. EYE DISCHARGE: "Is there any discharge (pus) from the eye(s)?"  If Yes, ask: "What color is it?"      water 8. FEVER: "Do you have a fever?" If Yes, ask: "What is it, how was it measured, and when did it start?"      denies 9. OTHER SYMPTOMS: "Do you have  any other symptoms?" (e.g., headache, nasal discharge, facial rash)     Headache and right side of face is numb.  Protocols used: Eye Pain and Other Symptoms-A-AH

## 2023-06-29 ENCOUNTER — Other Ambulatory Visit: Payer: Self-pay | Admitting: Family Medicine

## 2023-06-29 DIAGNOSIS — I10 Essential (primary) hypertension: Secondary | ICD-10-CM

## 2023-07-03 ENCOUNTER — Ambulatory Visit: Payer: Self-pay

## 2023-07-03 NOTE — Telephone Encounter (Signed)
 Chief Complaint: Back pain, low left Symptoms: radiates Frequency: x 2 days Pertinent Negatives: Patient denies fever, abd pain, hematuria Disposition: [] ED /[] Urgent Care (no appt availability in office) / [x] Appointment(In office/virtual)/ []  [place] [facility]/ [] Home Care/ [] Refused Recommended Disposition /[] [place] [facility]/ []  Follow-up with PCP Additional Notes: Pt reports she is experiencing an exacerbation of lower left back pain at 9/10. Pt denies numbness, weakness, urinary symptoms. OV scheduled prior to triage, pt requests to keep this appt. This RN educated pt on home care, new-worsening symptoms, when to call back/seek emergent care. Pt verbalized understanding and agrees to plan.    wrote: Red Word that prompted transfer to Nurse Triage: Lower back pain, on scale level 1-10 pain is a 9, patient has taken some medicine for the pain, patient has been having this pain since yesterday. Patients callback number is [phone]. Reason for Disposition  [1] SEVERE back pain (e.g., excruciating, unable to do any normal activities) AND [2] not improved 2 hours after pain medicine  Answer Assessment - Initial Assessment Questions 1. ONSET: "When did the pain begin?"      Yesterday 2. LOCATION: "Where does it hurt?" (upper, mid or lower back)     Low back, left 3. SEVERITY: "How bad is the pain?"  (e.g., Scale 1-10; mild, moderate, or severe)   - MILD (1-3): Doesn't interfere with normal activities.    - MODERATE (4-7): Interferes with normal activities or awakens from sleep.    - SEVERE (8-10): Excruciating pain, unable to do any normal activities.      9/10 4. PATTERN: "Is the pain constant?" (e.g., yes, no; constant, intermittent)      Constant 5. RADIATION: "Does the pain shoot into your legs or somewhere else?"     Radiates down left leg to foot 6. CAUSE:  "What do you think is causing the back pain?"      Chronic exacerbation 8. MEDICINES: "What have you taken so  far for the pain?" (e.g., nothing, acetaminophen , NSAIDS)     Klonopin, no improvement 9. NEUROLOGIC SYMPTOMS: "Do you have any weakness, numbness, or problems with bowel/bladder control?"     None 10. OTHER SYMPTOMS: "Do you have any other symptoms?" (e.g., fever, abdomen pain, burning with urination, blood in urine)       None  Protocols used: Back Pain-A-AH

## 2023-07-04 ENCOUNTER — Ambulatory Visit: Admitting: Family Medicine

## 2023-07-04 VITALS — BP 133/84 | HR 73 | Ht 63.0 in | Wt 164.0 lb

## 2023-07-04 DIAGNOSIS — M62830 Muscle spasm of back: Secondary | ICD-10-CM | POA: Diagnosis not present

## 2023-07-04 LAB — URINALYSIS, COMPLETE
Bilirubin, UA: NEGATIVE
Glucose, UA: NEGATIVE
Leukocytes,UA: NEGATIVE
Nitrite, UA: NEGATIVE
RBC, UA: NEGATIVE
Specific Gravity, UA: >1.03 — ABNORMAL HIGH (ref 1.005–1.030)
Urobilinogen, Ur: 1 mg/dL (ref 0.2–1.0)
pH, UA: 5.5 (ref 5.0–7.5)

## 2023-07-04 MED ORDER — METHYLPREDNISOLONE ACETATE 80 MG/ML IJ SUSP
80.0000 mg | Freq: Once | INTRAMUSCULAR | Status: AC
  Administered 2023-07-04: 80 mg via INTRA_ARTICULAR

## 2023-07-04 MED ORDER — TIZANIDINE HCL 2 MG PO TABS: 2.0000 mg | ORAL_TABLET | Freq: Three times a day (TID) | ORAL | 3 refills | Status: DC | PRN

## 2023-07-04 NOTE — Progress Notes (Signed)
 BP 133/84   Pulse 73   Ht 5\' 3"  (1.6 m)   Wt 164 lb (74.4 kg)   LMP 08/10/2016 Comment: SCH  SpO2 96%   BMI 29.05 kg/m    Subjective:   Patient ID: Stacy Wolf, female    DOB: 1975-03-13, 48 y.o.   MRN: [id number]  HPI: Stacy Wolf is a 48 y.o. female presenting on 07/04/2023 for Medical Management of Chronic Issues and Back Pain   HPI Back pain Patient is coming in complaining of back pain on the left lower back/buttocks that radiates down her leg.  She says it has been going on over the past few days.  She cannot recall any specific incident that brought it up on her.  She denies any urinary burning or dysuria or frequency or vaginal discharge or irritation.  Relevant past medical, surgical, family and social history reviewed and updated as indicated. Interim medical history since our last visit reviewed. Allergies and medications reviewed and updated.  Review of Systems  Constitutional:  Negative for chills and fever.  Eyes:  Negative for visual disturbance.  Respiratory:  Negative for chest tightness and shortness of breath.   Cardiovascular:  Negative for chest pain and leg swelling.  Genitourinary:  Negative for difficulty urinating, dysuria and frequency.  Musculoskeletal:  Positive for arthralgias, back pain and myalgias. Negative for gait problem.  Skin:  Negative for rash.  Neurological:  Negative for light-headedness and headaches.  Psychiatric/Behavioral:  Negative for agitation and behavioral problems.   All other systems reviewed and are negative.   Per HPI unless specifically indicated above   Allergies as of 07/04/2023       Reactions   Imitrex  [sumatriptan ] Anaphylaxis, Other (See Comments)   Could not swallow after taking med   Latuda [lurasidone Hcl] Other (See Comments)   Suicidal thoughts   Wellbutrin [bupropion] Other (See Comments)   Seizures / lowered seizure threshold   Zoloft [sertraline Hcl] Hives   Xanax [alprazolam] Other (See  Comments)   Passed out   Haloperidol And Related Other (See Comments)   Leg cramps   Nicoderm [nicotine] Dermatitis        Medication List        Accurate as of Jul 04, 2023  4:28 PM. If you have any questions, ask your nurse or doctor.          STOP taking these medications    amoxicillin -clavulanate 875-125 MG tablet Commonly known as: AUGMENTIN  Stopped by: Lucio Sabin Harjas Biggins   Cipro  HC OTIC suspension Generic drug: ciprofloxacin -hydrocortisone  Stopped by: Lucio Sabin Jenalyn Girdner   neomycin -polymyxin-hydrocortisone  OTIC solution Commonly known as: CORTISPORIN Stopped by: Lucio Sabin Yamilet Mcfayden       TAKE these medications    amLODipine  5 MG tablet Commonly known as: NORVASC  Take 1 tablet (5 mg total) by mouth daily. **NEEDS TO BE SEEN BEFORE NEXT REFILL**   aspirin-acetaminophen -caffeine 250-250-65 MG tablet Commonly known as: EXCEDRIN MIGRAINE Take 2 tablets by mouth every 6 (six) hours as needed for headache or migraine.   estradiol  1 MG tablet Commonly known as: Estrace  Take 1 tablet (1 mg total) by mouth daily.   fluticasone  50 MCG/ACT nasal spray Commonly known as: FLONASE  Place 1 spray into both nostrils 2 (two) times daily as needed for allergies or rhinitis.   gabapentin  100 MG capsule Commonly known as: NEURONTIN  Take 1 capsule (100 mg total) by mouth 3 (three) times daily.   ibuprofen  800 MG tablet Commonly known as: ADVIL  TAKE  1 TABLET BY MOUTH EVERY 8 HOURS AS NEEDED   lansoprazole  15 MG disintegrating tablet Commonly known as: PREVACID  SOLUTAB Take by mouth.   lisinopril  5 MG tablet Commonly known as: ZESTRIL  Take 1 tablet (5 mg total) by mouth daily. **NEEDS TO BE SEEN BEFORE NEXT REFILL**   methocarbamol  500 MG tablet Commonly known as: ROBAXIN  Take 1 tablet (500 mg total) by mouth 3 (three) times daily.   tiZANidine  2 MG tablet Commonly known as: ZANAFLEX  Take 1 tablet (2 mg total) by mouth 3 (three) times daily as needed for muscle  spasms (for back pain).         Objective:   BP 133/84   Pulse 73   Ht 5\' 3"  (1.6 m)   Wt 164 lb (74.4 kg)   LMP 08/10/2016 Comment: SCH  SpO2 96%   BMI 29.05 kg/m   Wt Readings from Last 3 Encounters:  07/04/23 164 lb (74.4 kg)  04/01/23 167 lb 8.8 oz (76 kg)  03/12/23 165 lb 9.6 oz (75.1 kg)    Physical Exam Vitals and nursing note reviewed.  Constitutional:      General: She is not in acute distress.    Appearance: She is well-developed. She is not diaphoretic.  Eyes:     Conjunctiva/sclera: Conjunctivae normal.  Pulmonary:     Breath sounds: No wheezing.  Musculoskeletal:        General: Normal range of motion.     Lumbar back: Tenderness present. No swelling or spasms.       Back:  Skin:    General: Skin is warm and dry.     Findings: No rash.  Neurological:     Mental Status: She is alert and oriented to person, place, and time.     Coordination: Coordination normal.  Psychiatric:        Behavior: Behavior normal.       Assessment & Plan:   Problem List Items Addressed This Visit   None Visit Diagnoses       Back spasm    -  Primary   Relevant Medications   methylPREDNISolone  acetate (DEPO-MEDROL ) injection 80 mg (Start on 07/04/2023  4:30 PM)   Other Relevant Orders   Urinalysis, Complete   Urine Culture       Lower back spasm, likely sciatic nerve in origin.  Will do Depo-Medrol  80 intramuscular for her today Follow up plan: Return if symptoms worsen or fail to improve.  Counseling provided for all of the vaccine components Orders Placed This Encounter  Procedures   Urine Culture   Urinalysis, Complete    Jolyne Needs, MD Vickie Grana [facility] 07/04/2023, 4:28 PM

## 2023-07-05 LAB — URINE CULTURE

## 2023-07-09 ENCOUNTER — Ambulatory Visit: Admitting: Family Medicine

## 2023-07-09 ENCOUNTER — Encounter: Payer: Self-pay | Admitting: Family Medicine

## 2023-07-09 VITALS — BP 127/84 | HR 55 | Temp 97.0°F | Ht 63.0 in | Wt 162.0 lb

## 2023-07-09 DIAGNOSIS — N898 Other specified noninflammatory disorders of vagina: Secondary | ICD-10-CM

## 2023-07-09 DIAGNOSIS — B9689 Other specified bacterial agents as the cause of diseases classified elsewhere: Secondary | ICD-10-CM | POA: Diagnosis not present

## 2023-07-09 DIAGNOSIS — B3731 Acute candidiasis of vulva and vagina: Secondary | ICD-10-CM | POA: Diagnosis not present

## 2023-07-09 DIAGNOSIS — R350 Frequency of micturition: Secondary | ICD-10-CM | POA: Diagnosis not present

## 2023-07-09 DIAGNOSIS — N760 Acute vaginitis: Secondary | ICD-10-CM

## 2023-07-09 LAB — URINALYSIS, ROUTINE W REFLEX MICROSCOPIC
Bilirubin, UA: NEGATIVE
Glucose, UA: NEGATIVE
Leukocytes,UA: NEGATIVE
Nitrite, UA: NEGATIVE
Specific Gravity, UA: 1.02 (ref 1.005–1.030)
Urobilinogen, Ur: 0.2 mg/dL (ref 0.2–1.0)
pH, UA: 6 (ref 5.0–7.5)

## 2023-07-09 LAB — MICROSCOPIC EXAMINATION
RBC, Urine: NONE SEEN /HPF (ref 0–2)
Renal Epithel, UA: NONE SEEN /HPF
WBC, UA: NONE SEEN /HPF (ref 0–5)

## 2023-07-09 LAB — WET PREP FOR TRICH, YEAST, CLUE
Clue Cell Exam: POSITIVE — AB
Trichomonas Exam: NEGATIVE
Yeast Exam: POSITIVE — AB

## 2023-07-09 MED ORDER — FLUCONAZOLE 150 MG PO TABS: 150.0000 mg | ORAL_TABLET | Freq: Once | ORAL | 1 refills | Status: AC

## 2023-07-09 MED ORDER — METRONIDAZOLE 500 MG PO TABS: 500.0000 mg | ORAL_TABLET | Freq: Two times a day (BID) | ORAL | 0 refills | Status: DC

## 2023-07-09 NOTE — Progress Notes (Signed)
 Subjective:  Patient ID: Stacy Wolf, female    DOB: [birth year], 48 y.o.   MRN: [id number]  Patient Care Team: Dettinger, Lucio Sabin, MD as PCP - General (Family Medicine)   Chief Complaint:  vagial odor (X 3 days )   HPI: Stacy Wolf is a 49 y.o. female presenting on 07/09/2023 for vagial odor (X 3 days )   History of Present Illness   Stacy Wolf is a 48 year old female with a history of recurrent bacterial vaginosis who presents with vaginal odor and pelvic pain.  She has been experiencing a fishy vaginal odor for the past three days, which she associates with the use of panty liners. She has a history of bacterial vaginosis and recognizes the symptoms as similar to previous episodes. No vaginal discharge or bleeding is present. She mentions that tampons have exacerbated her symptoms of bacterial vaginosis in the past.  Pelvic pain is localized to the side and is exacerbated by movement. She denies recent sexual activity since February and has not experienced any urinary tract infections recently. However, she reports increased urinary frequency without dysuria or hematuria. A urine culture from last week was negative for infection.  Her past medical history includes a hysterectomy, and she no longer experiences menstrual cycles. She works night shifts from 7 PM to 7 AM and had only one night off recently.          Relevant past medical, surgical, family, and social history reviewed and updated as indicated.  Allergies and medications reviewed and updated. Data reviewed: Chart in Epic.   Past Medical History:  Diagnosis Date   Anxiety    on meds   Bipolar 1 disorder (HCC)    Blood transfusion without reported diagnosis 2000   Depression    on meds   GERD (gastroesophageal reflux disease)    on meds   Glaucoma    LEFT eye- not a surgical candidate at this time (02/06/2021)   Heart murmur    History of kidney stones    Hypertension    on meds   Migraines     Schizophrenia (HCC)    Seasonal allergies     Past Surgical History:  Procedure Laterality Date   BILATERAL SALPINGECTOMY Bilateral 08/29/2016   Procedure: BILATERAL SALPINGECTOMY;  Surgeon: Wendelyn Halter, MD;  Location: [facility];  Service: Gynecology;  Laterality: Bilateral;   CARPAL TUNNEL RELEASE Left 04/11/2016   Procedure: CARPAL TUNNEL RELEASE;  Surgeon: Darrin Emerald, MD;  Location: [facility];  Service: Orthopedics;  Laterality: Left;   CARPAL TUNNEL RELEASE Right 03/13/2018   Procedure: RIGHT CARPAL TUNNEL RELEASE;  Surgeon: Darrin Emerald, MD;  Location: [facility];  Service: Orthopedics;  Laterality: Right;   KNEE ARTHROSCOPY WITH LATERAL MENISECTOMY Right 09/19/2021   Procedure: KNEE ARTHROSCOPY WITH LATERAL MENISCECTOMY;  Surgeon: Darrin Emerald, MD;  Location: [facility];  Service: Orthopedics;  Laterality: Right;   SUPRACERVICAL ABDOMINAL HYSTERECTOMY N/A 08/29/2016   Procedure: HYSTERECTOMY SUPRACERVICAL ABDOMINAL;  Surgeon: Wendelyn Halter, MD;  Location: [facility];  Service: Gynecology;  Laterality: N/A;   TRIGGER FINGER RELEASE Right 11/18/2018   Procedure: RELEASE TRIGGER FINGER/A-1 PULLEY right ring;  Surgeon: Darrin Emerald, MD;  Location: [facility];  Service: Orthopedics;  Laterality: Right;   TUBAL LIGATION     WISDOM TOOTH EXTRACTION     WRIST SURGERY Right 2006   tendonitis    Social History   Socioeconomic History   Marital  status: Divorced    Spouse name: Not on file   Number of children: 3   Years of education: Not on file   Highest education level: 11th grade  Occupational History   Not on file  Tobacco Use   Smoking status: Every Day    Current packs/day: 2.00    Average packs/day: 2.0 packs/day for 7.0 years (14.0 ttl pk-yrs)    Types: Cigars, Cigarettes   Smokeless tobacco: Never   Tobacco comments:    1 cigar per day  Vaping Use   Vaping status: Never Used  Substance and Sexual Activity   Alcohol use: Not Currently    Alcohol/week: 0.0 -  1.0 standard drinks of alcohol    Comment: occasionally   Drug use: Yes    Frequency: 4.0 times per week    Types: Marijuana    Comment: 09/15/21   Sexual activity: Yes    Birth control/protection: Surgical    Comment: supracervical hyst  Other Topics Concern   Not on file  Social History Narrative   Not on file   Social Drivers of Health   Financial Resource Strain: High Risk (07/04/2023)   Overall Financial Resource Strain (CARDIA)    Difficulty of Paying Living Expenses: Hard  Food Insecurity: Patient Declined (07/04/2023)   Hunger Vital Sign    Worried About Running Out of Food in the Last Year: Patient declined    Ran Out of Food in the Last Year: Patient declined  Transportation Needs: No Transportation Needs (07/04/2023)   PRAPARE - Administrator, Civil Service (Medical): No    Lack of Transportation (Non-Medical): No  Physical Activity: Unknown (07/04/2023)   Exercise Vital Sign    Days of Exercise per Week: 2 days    Minutes of Exercise per Session: Patient declined  Stress: Patient Declined (07/04/2023)   [employer] of Occupational Health - Occupational Stress Questionnaire    Feeling of Stress : Patient declined  Social Connections: Unknown (07/04/2023)   Social Connection and Isolation Panel [NHANES]    Frequency of Communication with Friends and Family: More than three times a week    Frequency of Social Gatherings with Friends and Family: Patient declined    Attends Religious Services: Patient declined    Database administrator or Organizations: Patient declined    Attends Banker Meetings: Not on file    Marital Status: Patient declined  Intimate Partner Violence: Not At Risk (04/25/2017)   Humiliation, Afraid, Rape, and Kick questionnaire    Fear of Current or Ex-Partner: No    Emotionally Abused: No    Physically Abused: No    Sexually Abused: No    Outpatient Encounter Medications as of 07/09/2023  Medication Sig   amLODipine   (NORVASC ) 5 MG tablet Take 1 tablet (5 mg total) by mouth daily. **NEEDS TO BE SEEN BEFORE NEXT REFILL**   aspirin-acetaminophen -caffeine (EXCEDRIN MIGRAINE) 250-250-65 MG tablet Take 2 tablets by mouth every 6 (six) hours as needed for headache or migraine.    estradiol  (ESTRACE ) 1 MG tablet Take 1 tablet (1 mg total) by mouth daily.   fluconazole  (DIFLUCAN ) 150 MG tablet Take 1 tablet (150 mg total) by mouth once for 1 dose.   fluticasone  (FLONASE ) 50 MCG/ACT nasal spray Place 1 spray into both nostrils 2 (two) times daily as needed for allergies or rhinitis.   gabapentin  (NEURONTIN ) 100 MG capsule Take 1 capsule (100 mg total) by mouth 3 (three) times daily.   ibuprofen  (ADVIL )  800 MG tablet TAKE 1 TABLET BY MOUTH EVERY 8 HOURS AS NEEDED   lansoprazole  (PREVACID  SOLUTAB) 15 MG disintegrating tablet Take by mouth.   lisinopril  (ZESTRIL ) 5 MG tablet Take 1 tablet (5 mg total) by mouth daily. **NEEDS TO BE SEEN BEFORE NEXT REFILL**   methocarbamol  (ROBAXIN ) 500 MG tablet Take 1 tablet (500 mg total) by mouth 3 (three) times daily.   metroNIDAZOLE  (FLAGYL ) 500 MG tablet Take 1 tablet (500 mg total) by mouth 2 (two) times daily.   tiZANidine  (ZANAFLEX ) 2 MG tablet Take 1 tablet (2 mg total) by mouth 3 (three) times daily as needed for muscle spasms (for back pain).   No facility-administered encounter medications on file as of 07/09/2023.    Allergies  Allergen Reactions   Imitrex  [Sumatriptan ] Anaphylaxis and Other (See Comments)    Could not swallow after taking med   Latuda [Lurasidone Hcl] Other (See Comments)    Suicidal thoughts    Wellbutrin [Bupropion] Other (See Comments)    Seizures / lowered seizure threshold   Zoloft [Sertraline Hcl] Hives   Xanax [Alprazolam] Other (See Comments)    Passed out   Haloperidol And Related Other (See Comments)    Leg cramps   Nicoderm [Nicotine] Dermatitis    Pertinent ROS per HPI, otherwise unremarkable      Objective:  BP 127/84   Pulse  (!) 55   Temp (!) 97 F (36.1 C)   Ht 5\' 3"  (1.6 m)   Wt 162 lb (73.5 kg)   LMP 08/10/2016 Comment: [facility]  SpO2 98%   BMI 28.70 kg/m    Wt Readings from Last 3 Encounters:  07/09/23 162 lb (73.5 kg)  07/04/23 164 lb (74.4 kg)  04/01/23 167 lb 8.8 oz (76 kg)    Physical Exam Vitals and nursing note reviewed.  Constitutional:      General: She is not in acute distress.    Appearance: Normal appearance. She is not ill-appearing, toxic-appearing or diaphoretic.  HENT:     Head: Normocephalic and atraumatic.     Nose: Nose normal.     Mouth/Throat:     Mouth: Mucous membranes are moist.  Eyes:     Conjunctiva/sclera: Conjunctivae normal.     Pupils: Pupils are equal, round, and reactive to light.  Cardiovascular:     Rate and Rhythm: Normal rate and regular rhythm.     Heart sounds: Normal heart sounds.  Pulmonary:     Effort: Pulmonary effort is normal.     Breath sounds: Normal breath sounds.  Abdominal:     General: Bowel sounds are normal.     Palpations: Abdomen is soft.     Tenderness: There is no abdominal tenderness.  Genitourinary:    Comments: Deferred, did self wet prep Skin:    General: Skin is warm and dry.     Capillary Refill: Capillary refill takes less than 2 seconds.  Neurological:     General: No focal deficit present.     Mental Status: She is alert and oriented to person, place, and time.  Psychiatric:        Mood and Affect: Mood normal.        Behavior: Behavior normal.        Thought Content: Thought content normal.        Judgment: Judgment normal.       Results for orders placed or performed in visit on 07/09/23  WET PREP FOR TRICH, YEAST, CLUE   Collection Time: 07/09/23  3:51 PM   Specimen: Vaginal Swab   Vaginal Swab  Result Value Ref Range   Trichomonas Exam Negative Negative   Yeast Exam Positive (A) Negative   Clue Cell Exam Positive (A) Negative  Microscopic Examination   Collection Time: 07/09/23  4:09 PM   Urine  Result  Value Ref Range   WBC, UA None seen 0 - 5 /hpf   RBC, Urine None seen 0 - 2 /hpf   Epithelial Cells (non renal) 0-10 0 - 10 /hpf   Renal Epithel, UA None seen None seen /hpf   Bacteria, UA Few (A) None seen/Few   Yeast, UA Present (A) None seen  Urinalysis, Routine w reflex microscopic   Collection Time: 07/09/23  4:09 PM  Result Value Ref Range   Specific Gravity, UA 1.020 1.005 - 1.030   pH, UA 6.0 5.0 - 7.5   Color, UA Yellow Yellow   Appearance Ur Clear Clear   Leukocytes,UA Negative Negative   Protein,UA Trace (A) Negative/Trace   Glucose, UA Negative Negative   Ketones, UA Trace (A) Negative   RBC, UA Trace (A) Negative   Bilirubin, UA Negative Negative   Urobilinogen, Ur 0.2 0.2 - 1.0 mg/dL   Nitrite, UA Negative Negative   Microscopic Examination See below:        Pertinent labs & imaging results that were available during my care of the patient were reviewed by me and considered in my medical decision making.  Assessment & Plan:  Maximina was seen today for vagial odor.  Diagnoses and all orders for this visit:  Vaginal odor -     WET PREP FOR TRICH, YEAST, CLUE  Urinary frequency -     Urine Culture -     Urinalysis, Routine w reflex microscopic -     Microscopic Examination  Yeast vaginitis -     fluconazole  (DIFLUCAN ) 150 MG tablet; Take 1 tablet (150 mg total) by mouth once for 1 dose.  Bacterial vaginosis -     metroNIDAZOLE  (FLAGYL ) 500 MG tablet; Take 1 tablet (500 mg total) by mouth 2 (two) times daily.       Bacterial vaginosis and candidiasis Bacterial vaginosis and candidiasis confirmed by wet prep. Symptoms include fishy odor and irritation. No recent sexual activity. Symptoms present for three days. Recurrent infections possibly triggered by panty liners. Informed about severe illness risk with alcohol consumption while taking Flagyl . - Prescribe Diflucan  with one refill for candidiasis - Prescribe Flagyl  for bacterial vaginosis - Advise  against alcohol consumption while taking Flagyl  due to risk of severe illness - Provide educational materials on vaginitis and vaginal hygiene - Recommend using all-cotton, unscented panty liners to prevent recurrence  Frequent urination Increased urinary frequency without evidence of urinary tract infection. Negative urine culture from last week. Symptoms may relate to irritation from bacterial vaginosis and candidiasis. No hematuria reported. - Order urine culture to rule out urinary tract infection - Reassess urinary symptoms after treatment of bacterial vaginosis and candidiasis          Continue all other maintenance medications.  Follow up plan: Return if symptoms worsen or fail to improve.   Continue healthy lifestyle choices, including diet (rich in fruits, vegetables, and lean proteins, and low in salt and simple carbohydrates) and exercise (at least 30 minutes of moderate physical activity daily).  Educational handout given for BV, vaginal hygiene   The above assessment and management plan was discussed with the patient. The patient verbalized understanding of and  has agreed to the management plan. Patient is aware to call the clinic if they develop any new symptoms or if symptoms persist or worsen. Patient is aware when to return to the clinic for a follow-up visit. Patient educated on when it is appropriate to go to the emergency department.   Kattie Parrot, FNP-C [facility] [phone]

## 2023-07-09 NOTE — Patient Instructions (Signed)
Healthy vaginal hygiene practices   -  Avoid sleeper pajamas. Nightgowns allow air to circulate.  Sleep without underpants whenever possible.  -  Wear cotton underpants during the day. Double-rinse underwear after washing to avoid residual irritants. Do not use fabric softeners for underwear and swimsuits.  - Avoid tights, leotards, leggings, "skinny" jeans, and other tight-fitting clothing. Skirts and loose-fitting pants allow air to circulate.  - Avoid pantyliners.  Instead use tampons or cotton pads.  - Daily warm bathing is helpful:     - Soak in clean water (no soap) for 10 to 15 minutes.     - Use soap to wash regions other than the genital area just before getting out of the tub or drain water and take a shower to wash your body. Limit use of any soap on genital areas. Use   fragance-free soaps.     - Rinse the genital area well and gently pat dry.  Don't rub.  Hair dryer to assist with drying can be used only if on cool setting.     - Do not use bubble baths or perfumed soaps.  - Do not use any feminine sprays, douches or powders.  These contain chemicals that will irritate the skin.  - If the genital area is tender or swollen, cool compresses may relieve the discomfort. Unscented wet wipes can be used instead of toilet paper for wiping.   - Emollients, such as Vaseline, may help protect skin and can be applied to the irritated area.  - Always remember to wipe front-to-back after bowel movements. Pat dry after urination.  - Do not sit in wet swimsuits for long periods of time after swimming  

## 2023-07-10 LAB — URINE CULTURE

## 2023-08-05 ENCOUNTER — Other Ambulatory Visit: Payer: Self-pay | Admitting: Family Medicine

## 2023-08-05 ENCOUNTER — Ambulatory Visit: Payer: Self-pay

## 2023-08-05 DIAGNOSIS — I10 Essential (primary) hypertension: Secondary | ICD-10-CM

## 2023-08-05 MED ORDER — LISINOPRIL 5 MG PO TABS: 5.0000 mg | ORAL_TABLET | Freq: Every day | ORAL | 0 refills | Status: DC

## 2023-08-05 NOTE — Telephone Encounter (Signed)
 Copied from CRM [id number]. Topic: Clinical - Red Word Triage >> Aug 05, 2023  8:30 AM Tisa Forester wrote: Red Word that prompted transfer to Nurse Triage:  patient right  hand swollen , her middle and the ring finger will not straight up , patient hit her finger at work last night ,   patient call back number [phone]  Chief Complaint: right hand injury Symptoms: pain, swollen Frequency: constant Pertinent Negatives: Patient denies fever, cp, sob Disposition: [] ED /[x] Urgent Care (no appt availability in office) / [] Appointment(In office/virtual)/ []  [facility]/ [] Home Care/ [] Refused Recommended Disposition /[] [facility]/ []  Follow-up with PCP Additional Notes: no apt available today; care advice given, denies questions; instructed to go to ER if becomes worse.   Reason for Disposition  Can't use injured hand normally (e.g., make a fist, open fully, hold a glass of water)  Answer Assessment - Initial Assessment Questions 1. MECHANISM: "How did the injury happen?"     Right hand injury, a tool hit hand 2. ONSET: "When did the injury happen?" (Minutes or hours ago)      Last night 3. APPEARANCE of INJURY: "What does the injury look like?"      Swollen, and a small lac 4. SEVERITY: "Can you use the hand normally?" "Can you bend your fingers into a ball and then fully open them?"     Her dominate hand, no 5. SIZE: For cuts, bruises, or swelling, ask: "How large is it?" (e.g., inches or centimeters;  entire hand or wrist)      Lac to knuckle 6. PAIN: "Is there pain?" If Yes, ask: "How bad is the pain?"  (Scale 1-10; or mild, moderate, severe)     severe 7. TETANUS: For any breaks in the skin, ask: "When was the last tetanus booster?"     no 8. OTHER SYMPTOMS: "Do you have any other symptoms?"      numbness 9. PREGNANCY: "Is there any chance you are pregnant?" "When was your last menstrual period?"     na  Protocols used: Hand and Wrist Injury-A-AH

## 2023-08-05 NOTE — Telephone Encounter (Signed)
 I called Stacy Wolf & made her an appt on 08-12-2023 at 9:10am w/Dettinger to get more refills on RX.

## 2023-08-05 NOTE — Telephone Encounter (Signed)
 Copied from CRM [id number]. Topic: Clinical - Medication Refill >> Aug 05, 2023  2:47 PM Tiffany H wrote: Medication: lisinopril  (ZESTRIL ) 5 MG tablet [[id number]]  Has the patient contacted their pharmacy? Yes (Agent: If no, request that the patient contact the pharmacy for the refill. If patient does not wish to contact the pharmacy document the reason why and proceed with request.) (Agent: If yes, when and what did the pharmacy advise?)  This is the patient's preferred pharmacy:  [id number] - [place], [place] - [address] [place] [address] Phone: [phone] Fax: [phone]  Is this the correct pharmacy for this prescription? Yes If no, delete pharmacy and type the correct one.   Has the prescription been filled recently? Yes  Is the patient out of the medication? Yes  Has the patient been seen for an appointment in the last year OR does the patient have an upcoming appointment? Yes  Can we respond through MyChart? Yes  Agent: Please be advised that Rx refills may take up to 3 business days. We ask that you follow-up with your pharmacy.

## 2023-08-05 NOTE — Telephone Encounter (Signed)
 Dettinger pt NTBS 30-d given 07/01/23

## 2023-08-05 NOTE — Addendum Note (Signed)
 Addended by: Sansa Alkema D on: 08/05/2023 03:39 PM   Modules accepted: Orders

## 2023-08-12 ENCOUNTER — Ambulatory Visit
Admission: RE | Admit: 2023-08-12 | Discharge: 2023-08-12 | Disposition: A | Discharge status: Home / Self Care | Source: Ambulatory Visit | Attending: Family Medicine

## 2023-08-12 ENCOUNTER — Ambulatory Visit: Admitting: Family Medicine

## 2023-08-12 ENCOUNTER — Encounter: Payer: Self-pay | Admitting: Family Medicine

## 2023-08-12 VITALS — BP 138/91 | HR 76 | Ht 63.0 in | Wt 159.0 lb

## 2023-08-12 DIAGNOSIS — E785 Hyperlipidemia, unspecified: Secondary | ICD-10-CM | POA: Diagnosis not present

## 2023-08-12 DIAGNOSIS — Z1231 Encounter for screening mammogram for malignant neoplasm of breast: Secondary | ICD-10-CM

## 2023-08-12 DIAGNOSIS — Z90710 Acquired absence of both cervix and uterus: Secondary | ICD-10-CM

## 2023-08-12 DIAGNOSIS — E8940 Asymptomatic postprocedural ovarian failure: Secondary | ICD-10-CM | POA: Diagnosis not present

## 2023-08-12 DIAGNOSIS — I10 Essential (primary) hypertension: Secondary | ICD-10-CM

## 2023-08-12 DIAGNOSIS — K219 Gastro-esophageal reflux disease without esophagitis: Secondary | ICD-10-CM

## 2023-08-12 LAB — LIPID PANEL

## 2023-08-12 MED ORDER — ESTRADIOL 1 MG PO TABS: 1.0000 mg | ORAL_TABLET | Freq: Every day | ORAL | 3 refills | Status: DC

## 2023-08-12 MED ORDER — LISINOPRIL 5 MG PO TABS: 5.0000 mg | ORAL_TABLET | Freq: Every day | ORAL | 3 refills | Status: AC

## 2023-08-12 MED ORDER — AMLODIPINE BESYLATE 5 MG PO TABS: 5.0000 mg | ORAL_TABLET | Freq: Every day | ORAL | 3 refills | Status: AC

## 2023-08-12 MED ORDER — ESTRADIOL 2 MG PO TABS
2.0000 mg | ORAL_TABLET | Freq: Every day | ORAL | 1 refills | Status: AC
Start: 2023-08-12 — End: ?

## 2023-08-12 NOTE — Progress Notes (Signed)
 BP (!) 138/91   Pulse 76   Ht 5\' 3"  (1.6 m)   Wt 159 lb (72.1 kg)   LMP 08/10/2016 Comment: SCH  SpO2 97%   BMI 28.17 kg/m    Subjective:   Patient ID: Stacy Wolf, female    DOB: 10/05/1975, 48 y.o.   MRN: [id number]  HPI: Stacy Wolf is a 48 y.o. female presenting on 08/12/2023 for Medical Management of Chronic Issues, Hyperlipidemia, and Shoulder Pain (Right. Radiates down to elbow)   HPI Hypertension Patient is currently on amlodipine  and lisinopril  although she admits she has been out of lisinopril , and their blood pressure today is 141/88. Patient denies any lightheadedness or dizziness. Patient denies headaches, blurred vision, chest pains, shortness of breath, or weakness. Denies any side effects from medication and is content with current medication.   Hyperlipidemia Patient is coming in for recheck of his hyperlipidemia. The patient is currently taking no medicine currently, focusing on diet. They deny any issues with myalgias or history of liver damage from it. They deny any focal numbness or weakness or chest pain.   GERD Patient is currently on lansoprazole .  She denies any major symptoms or abdominal pain or belching or burping. She denies any blood in her stool or lightheadedness or dizziness.   Patient is having a lot of hot flashes she is status post hysterectomy induced menopause.  She is working to reduce smoking and is only using cigars now ready for cigarettes.  Patient has mild shoulder and upper back pain, she sees Ortho soon.  She says it is from overworking at work.  Relevant past medical, surgical, family and social history reviewed and updated as indicated. Interim medical history since our last visit reviewed. Allergies and medications reviewed and updated.  Review of Systems  Constitutional:  Negative for chills and fever.  HENT:  Negative for congestion, ear discharge and ear pain.   Eyes:  Negative for redness and visual disturbance.   Respiratory:  Negative for chest tightness and shortness of breath.   Cardiovascular:  Negative for chest pain and leg swelling.  Genitourinary:  Negative for difficulty urinating and dysuria.  Musculoskeletal:  Positive for arthralgias and myalgias. Negative for back pain and gait problem.  Skin:  Negative for rash.  Neurological:  Negative for dizziness, light-headedness and headaches.  Psychiatric/Behavioral:  Negative for agitation and behavioral problems.   All other systems reviewed and are negative.   Per HPI unless specifically indicated above   Allergies as of 08/12/2023       Reactions   Imitrex  [sumatriptan ] Anaphylaxis, Other (See Comments)   Could not swallow after taking med   Latuda [lurasidone Hcl] Other (See Comments)   Suicidal thoughts   Wellbutrin [bupropion] Other (See Comments)   Seizures / lowered seizure threshold   Zoloft [sertraline Hcl] Hives   Xanax [alprazolam] Other (See Comments)   Passed out   Haloperidol And Related Other (See Comments)   Leg cramps   Nicoderm [nicotine] Dermatitis        Medication List        Accurate as of August 12, 2023  9:46 AM. If you have any questions, ask your nurse or doctor.          STOP taking these medications    metroNIDAZOLE  500 MG tablet Commonly known as: Flagyl  Stopped by: Lucio Sabin Kuzey Ogata       TAKE these medications    amLODipine  5 MG tablet Commonly known as: NORVASC  Take  1 tablet (5 mg total) by mouth daily. What changed: additional instructions Changed by: Lucio Sabin Rasheeda Mulvehill   aspirin-acetaminophen -caffeine 250-250-65 MG tablet Commonly known as: EXCEDRIN MIGRAINE Take 2 tablets by mouth every 6 (six) hours as needed for headache or migraine.   estradiol  2 MG tablet Commonly known as: Estrace  Take 1 tablet (2 mg total) by mouth daily. What changed:  medication strength how much to take Changed by: Lucio Sabin Shenise Wolgamott   fluticasone  50 MCG/ACT nasal spray Commonly known as:  FLONASE  Place 1 spray into both nostrils 2 (two) times daily as needed for allergies or rhinitis.   gabapentin  100 MG capsule Commonly known as: NEURONTIN  Take 1 capsule (100 mg total) by mouth 3 (three) times daily.   ibuprofen  800 MG tablet Commonly known as: ADVIL  TAKE 1 TABLET BY MOUTH EVERY 8 HOURS AS NEEDED   lansoprazole  15 MG disintegrating tablet Commonly known as: PREVACID  SOLUTAB Take by mouth.   lisinopril  5 MG tablet Commonly known as: ZESTRIL  Take 1 tablet (5 mg total) by mouth daily.   methocarbamol  500 MG tablet Commonly known as: ROBAXIN  Take 1 tablet (500 mg total) by mouth 3 (three) times daily.   tiZANidine  2 MG tablet Commonly known as: ZANAFLEX  Take 1 tablet (2 mg total) by mouth 3 (three) times daily as needed for muscle spasms (for back pain).         Objective:   BP (!) 138/91   Pulse 76   Ht 5\' 3"  (1.6 m)   Wt 159 lb (72.1 kg)   LMP 08/10/2016 Comment: SCH  SpO2 97%   BMI 28.17 kg/m   Wt Readings from Last 3 Encounters:  08/12/23 159 lb (72.1 kg)  07/09/23 162 lb (73.5 kg)  07/04/23 164 lb (74.4 kg)    Physical Exam Vitals and nursing note reviewed.  Constitutional:      General: She is not in acute distress.    Appearance: She is well-developed. She is not diaphoretic.  Eyes:     Conjunctiva/sclera: Conjunctivae normal.  Cardiovascular:     Rate and Rhythm: Normal rate and regular rhythm.     Heart sounds: Normal heart sounds. No murmur heard. Pulmonary:     Effort: Pulmonary effort is normal. No respiratory distress.     Breath sounds: Normal breath sounds. No wheezing.  Abdominal:     General: Abdomen is flat. Bowel sounds are normal. There is no distension.     Palpations: Abdomen is soft.     Tenderness: There is no abdominal tenderness. There is no guarding or rebound.  Musculoskeletal:        General: No swelling.  Skin:    General: Skin is warm and dry.     Findings: No rash.  Neurological:     Mental Status: She  is alert and oriented to person, place, and time.     Coordination: Coordination normal.  Psychiatric:        Behavior: Behavior normal.       Assessment & Plan:   Problem List Items Addressed This Visit       Cardiovascular and Mediastinum   Essential hypertension, benign   Relevant Medications   amLODipine  (NORVASC ) 5 MG tablet   lisinopril  (ZESTRIL ) 5 MG tablet   Other Relevant Orders   CBC with Differential/Platelet     Digestive   Gastroesophageal reflux disease without esophagitis   Relevant Orders   CBC with Differential/Platelet     Other   Hyperlipidemia LDL goal <100 -  Primary   Relevant Medications   amLODipine  (NORVASC ) 5 MG tablet   lisinopril  (ZESTRIL ) 5 MG tablet   Other Relevant Orders   CMP14+EGFR   Lipid panel   Other Visit Diagnoses       Post hysterectomy menopause       Relevant Medications   estradiol  (ESTRACE ) 2 MG tablet     Encounter for screening mammogram for malignant neoplasm of breast       Relevant Orders   MM 3D SCREENING MAMMOGRAM BILATERAL BREAST       Will increase estrogen to 2 mg, she is going to quit smoking, she is already worked her way down quite a bit.  Scheduled for mammogram.  Will restart lisinopril . Follow up plan: Return in about 6 months (around 02/11/2024), or if symptoms worsen or fail to improve, for Physical exam and hypertension and cholesterol recheck.  Counseling provided for all of the vaccine components Orders Placed This Encounter  Procedures   MM 3D SCREENING MAMMOGRAM BILATERAL BREAST   CBC with Differential/Platelet   CMP14+EGFR   Lipid panel    Jolyne Needs, MD [facility] 08/12/2023, 9:46 AM

## 2023-08-13 LAB — CBC WITH DIFFERENTIAL/PLATELET
Basophils Absolute: 0 10*3/uL (ref 0.0–0.2)
Basos: 1 %
EOS (ABSOLUTE): 0.1 10*3/uL (ref 0.0–0.4)
Eos: 2 %
Hematocrit: 42.6 % (ref 34.0–46.6)
Hemoglobin: 13.8 g/dL (ref 11.1–15.9)
Immature Grans (Abs): 0 10*3/uL (ref 0.0–0.1)
Immature Granulocytes: 0 %
Lymphocytes Absolute: 3 10*3/uL (ref 0.7–3.1)
Lymphs: 48 %
MCH: 31.5 pg (ref 26.6–33.0)
MCHC: 32.4 g/dL (ref 31.5–35.7)
MCV: 97 fL (ref 79–97)
Monocytes Absolute: 0.4 10*3/uL (ref 0.1–0.9)
Monocytes: 7 %
Neutrophils Absolute: 2.6 10*3/uL (ref 1.4–7.0)
Neutrophils: 42 %
Platelets: 236 10*3/uL (ref 150–450)
RBC: 4.38 x10E6/uL (ref 3.77–5.28)
RDW: 13 % (ref 11.7–15.4)
WBC: 6.1 10*3/uL (ref 3.4–10.8)

## 2023-08-13 LAB — CMP14+EGFR
ALT: 6 IU/L (ref 0–32)
AST: 12 IU/L (ref 0–40)
Albumin: 4.5 g/dL (ref 3.9–4.9)
Alkaline Phosphatase: 95 IU/L (ref 44–121)
BUN/Creatinine Ratio: 11 (ref 9–23)
BUN: 11 mg/dL (ref 6–24)
Bilirubin Total: <0.2 mg/dL (ref 0.0–1.2)
CO2: 21 mmol/L (ref 20–29)
Calcium: 9.4 mg/dL (ref 8.7–10.2)
Chloride: 102 mmol/L (ref 96–106)
Creatinine, Ser: 1 mg/dL (ref 0.57–1.00)
Globulin, Total: 2.3 g/dL (ref 1.5–4.5)
Glucose: 96 mg/dL (ref 70–99)
Potassium: 3.9 mmol/L (ref 3.5–5.2)
Sodium: 141 mmol/L (ref 134–144)
Total Protein: 6.8 g/dL (ref 6.0–8.5)
eGFR: 69 mL/min/{1.73_m2} (ref >59)

## 2023-08-13 LAB — LIPID PANEL
Cholesterol, Total: 199 mg/dL (ref 100–199)
HDL: 53 mg/dL (ref >39)
LDL CALC COMMENT:: 3.8 ratio (ref 0.0–4.4)
LDL Chol Calc (NIH): 119 mg/dL — ABNORMAL HIGH (ref 0–99)
Triglycerides: 153 mg/dL — ABNORMAL HIGH (ref 0–149)
VLDL Cholesterol Cal: 27 mg/dL (ref 5–40)

## 2023-08-16 ENCOUNTER — Ambulatory Visit: Payer: Self-pay | Admitting: Family Medicine

## 2023-08-16 ENCOUNTER — Other Ambulatory Visit: Payer: Self-pay

## 2023-08-16 DIAGNOSIS — E785 Hyperlipidemia, unspecified: Secondary | ICD-10-CM

## 2023-08-16 MED ORDER — ROSUVASTATIN CALCIUM 5 MG PO TABS: 5.0000 mg | ORAL_TABLET | Freq: Every day | ORAL | 3 refills | Status: AC

## 2023-08-19 ENCOUNTER — Encounter: Payer: Self-pay | Admitting: Nurse Practitioner

## 2023-08-19 ENCOUNTER — Ambulatory Visit: Admitting: Nurse Practitioner

## 2023-08-19 VITALS — BP 137/74 | HR 61 | Temp 97.0°F | Ht 63.0 in | Wt 161.2 lb

## 2023-08-19 DIAGNOSIS — N898 Other specified noninflammatory disorders of vagina: Secondary | ICD-10-CM

## 2023-08-19 DIAGNOSIS — B9689 Other specified bacterial agents as the cause of diseases classified elsewhere: Secondary | ICD-10-CM

## 2023-08-19 DIAGNOSIS — R350 Frequency of micturition: Secondary | ICD-10-CM | POA: Diagnosis not present

## 2023-08-19 DIAGNOSIS — N760 Acute vaginitis: Secondary | ICD-10-CM | POA: Diagnosis not present

## 2023-08-19 LAB — MICROSCOPIC EXAMINATION
Bacteria, UA: NONE SEEN
Epithelial Cells (non renal): NONE SEEN /HPF (ref 0–10)
Renal Epithel, UA: NONE SEEN /HPF
WBC, UA: NONE SEEN /HPF (ref 0–5)
Yeast, UA: NONE SEEN

## 2023-08-19 LAB — URINALYSIS, ROUTINE W REFLEX MICROSCOPIC
Bilirubin, UA: NEGATIVE
Glucose, UA: NEGATIVE
Ketones, UA: NEGATIVE
Leukocytes,UA: NEGATIVE
Nitrite, UA: NEGATIVE
Protein,UA: NEGATIVE
Specific Gravity, UA: 1.01 (ref 1.005–1.030)
Urobilinogen, Ur: 0.2 mg/dL (ref 0.2–1.0)
pH, UA: 6.5 (ref 5.0–7.5)

## 2023-08-19 LAB — WET PREP FOR TRICH, YEAST, CLUE
Clue Cell Exam: POSITIVE — AB
Trichomonas Exam: NEGATIVE
Yeast Exam: NEGATIVE

## 2023-08-19 MED ORDER — FLUCONAZOLE 150 MG PO TABS
150.0000 mg | ORAL_TABLET | Freq: Every day | ORAL | 0 refills | Status: DC
Start: 2023-08-19 — End: 2023-11-05

## 2023-08-19 MED ORDER — METRONIDAZOLE 500 MG PO TABS: 500.0000 mg | ORAL_TABLET | Freq: Two times a day (BID) | ORAL | 0 refills | Status: DC

## 2023-08-19 NOTE — Progress Notes (Signed)
 Acute Office Visit  Subjective:     Patient ID: Stacy Wolf, female    DOB: Feb 17, 1976, 48 y.o.   MRN: [id number]  Chief Complaint  Patient presents with   Vaginal Discharge    Vaginal soreness, discharge, and odor for 2 days     HPI The patient is a 48 year old female presenting on 08/19/2023 with acute concerns of increased urination and vaginal discharge. She reports a 2-day history of white, curd-like, malodorous vaginal discharge. Denies abnormal vaginal bleeding, significant pelvic pain, or fever. Denies dysuria, urinary frequency, urgency, or other classic urinary tract symptoms. No known recent exposure to STIs. No prior similar episodes.  Patient's last menstrual period was 08/10/2016.   Active Ambulatory Problems    Diagnosis Date Noted   Bipolar disorder (HCC) 08/25/2013   Hyperlipidemia LDL goal <100 09/14/2013   Gastroesophageal reflux disease without esophagitis 08/24/2014   Tobacco abuse 02/02/2015   Migraine headache 04/18/2015   Essential hypertension, benign 01/16/2016   Obesity (BMI 30.0-34.9) 04/23/2016   Menorrhagia with irregular cycle 08/13/2016   Posttraumatic stress disorder 10/17/2016   Personality disorder (HCC) 10/17/2016   Current use of estrogen therapy 01/20/2019   Status post partial hysterectomy 08/19/2020   BV (bacterial vaginosis) 08/19/2023   Urinary frequency 08/19/2023   Resolved Ambulatory Problems    Diagnosis Date Noted   Constipation 02/02/2015   Carpal tunnel syndrome, left upper limb    Metabolic syndrome 04/23/2016   Status post trigger finger release 08/29/2016   Conduct disorder 10/17/2016   Carpal tunnel syndrome of right wrist    S/P carpal tunnel release right 03/13/18 03/28/2018   Trigger finger, acquired 04/28/2018   Screening for colorectal cancer 01/20/2019   Encounter for gynecological examination with Papanicolaou smear of cervix 01/20/2019   Routine medical exam 01/20/2019   Pain with urination 01/20/2019    Routine cervical smear 01/20/2019   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 GU: Positive for white, curd-like vaginal discharge. Denies dysuria, hematuria, urgency, or frequency. Denies vaginal bleeding or pelvic pain. GI: No nausea, vomiting, or abdominal pain. Skin: No rash or itching reported. Other systems: Negative unless otherwise noted.  Negative unless indicated in HPI    Objective:    BP 137/74   Pulse 61   Temp (!) 97 F (36.1 C) (Temporal)   Ht 5' 3 (1.6 m)   Wt 161 lb 3.2 oz (73.1 kg)   LMP 08/10/2016 Comment: SCH  SpO2 99%   BMI 28.56 kg/m  BP Readings from Last 3 Encounters:  08/19/23 137/74  08/12/23 (!) 138/91  07/09/23 127/84   Wt Readings from Last 3 Encounters:  08/19/23 161 lb 3.2 oz (73.1 kg)  08/12/23 159 lb (72.1 kg)  07/09/23 162 lb (73.5 kg)      Physical Exam General: Alert and oriented, in no acute distress.  Abdomen: Soft, non-tender, no rebound or guarding. Pelvic Exam: White, clumpy discharge noted in vaginal vault. No erythema or lesions on external genitalia. No cervical motion tenderness. No adnexal tenderness. Speculum: Vaginal mucosa mildly erythematous; cervix appears normal. Bimanual Exam: No masses or tenderness. Urine dipstick: trace of blood Microscopic wet-mount exam shows clue cells, WBC 0-2. Bacteria many, epithelial cells few, yeast negative.   No results  found for any visits on 08/19/23.     Assessment & Plan:  BV (bacterial vaginosis)  Vaginal discharge -     WET PREP FOR TRICH, YEAST, CLUE -     Urinalysis, Routine w reflex microscopic  Urinary frequency -     Urinalysis, Routine w reflex microscopic  Other orders -     metroNIDAZOLE ; Take 1 tablet (500 mg total) by mouth 2 (two) times daily.  Dispense: 14 tablet; Refill: 0 -     Fluconazole ; Take 1 tablet (150 mg total) by mouth daily.  Dispense: 1 tablet; Refill: 0  Stacy Wolf is 48 yrs old [place] American female seen today fro BP Flagyl  500 mg Bid, avoid ETOH Flucanozole 150 mg to take after compiting ATB   Return if symptoms worsen or fail to improve. Bianca Raneri [place], DNP [facility] [address] [place], [place] [address] [phone]  Note: This document was prepared by [employer] voice dictation technology and any errors that results from this process are unintentional.

## 2023-08-22 ENCOUNTER — Ambulatory Visit: Payer: Self-pay | Admitting: Nurse Practitioner

## 2023-08-23 ENCOUNTER — Encounter: Payer: Self-pay | Admitting: Orthopedic Surgery

## 2023-08-23 ENCOUNTER — Ambulatory Visit (INDEPENDENT_AMBULATORY_CARE_PROVIDER_SITE_OTHER): Payer: Self-pay

## 2023-08-23 ENCOUNTER — Other Ambulatory Visit: Payer: Self-pay | Admitting: Orthopedic Surgery

## 2023-08-23 ENCOUNTER — Ambulatory Visit: Admitting: Orthopedic Surgery

## 2023-08-23 VITALS — BP 137/74 | Ht 63.0 in | Wt 161.0 lb

## 2023-08-23 DIAGNOSIS — M62838 Other muscle spasm: Secondary | ICD-10-CM

## 2023-08-23 DIAGNOSIS — M792 Neuralgia and neuritis, unspecified: Secondary | ICD-10-CM | POA: Diagnosis not present

## 2023-08-23 MED ORDER — METHOCARBAMOL 750 MG PO TABS: 750.0000 mg | ORAL_TABLET | Freq: Four times a day (QID) | ORAL | 2 refills | Status: AC

## 2023-08-23 MED ORDER — TIZANIDINE HCL 4 MG PO TABS: 4.0000 mg | ORAL_TABLET | Freq: Four times a day (QID) | ORAL | 1 refills | Status: DC | PRN

## 2023-08-23 NOTE — Progress Notes (Signed)
  Intake history:  BP 137/74 Comment: 08/19/23  Ht 5' 3 (1.6 m)   Wt 161 lb (73 kg)   LMP 08/10/2016 Comment: SCH  BMI 28.52 kg/m  Body mass index is 28.52 kg/m.    WHAT ARE WE SEEING YOU FOR TODAY?   right arm(s) shoulder and neck into right elbow at times   How long has this bothered you? 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  No  Diabetes No  Heart disease No  Hypertension Yes  SMOKING HX Yes  Kidney disease No  Any ALLERGIES ______________ Allergies  Allergen Reactions   Imitrex  [Sumatriptan ] Anaphylaxis and Other (See Comments)    Could not swallow after taking med   Latuda [Lurasidone Hcl] Other (See Comments)    Suicidal thoughts    Wellbutrin [Bupropion] Other (See Comments)    Seizures / lowered seizure threshold   Zoloft [Sertraline Hcl] Hives   Xanax [Alprazolam] Other (See Comments)    Passed out   Haloperidol And Related Other (See Comments)    Leg cramps   Nicoderm [Nicotine] Dermatitis   ________________________________   Treatment:  Have you taken:  Tylenol  No  Advil  Yes  Had PT No  Had injection No  Other  ____________Gabapentin _____________

## 2023-08-23 NOTE — Addendum Note (Signed)
 Addended by: Darrin Emerald on: 08/23/2023 11:48 AM   Modules accepted: Orders

## 2023-08-23 NOTE — Progress Notes (Signed)
    Chief Complaint  Patient presents with   Arm Pain    Right    48 year old female presents with pain in her right arm neck and shoulder  This seems to have began after work related activities  She called out of work on the 19th for severe pain in the right arm shoulder and neck.  Pain localizes to the paraspinous muscles of the right side of her neck  Exam shows tenderness on the right side of the neck in the muscles out into the shoulder.  However the abduction and flexion test for strength were normal and range of motion was normal  Pain was reproduced with turning to the right and less so to the left  No midline pain  DG Cervical Spine 2 or 3 views Result Date: 08/23/2023 C-spine films Neck pain Loss of cervical lordosis endplate irregularities at C5 inferior endplate C6 inferior endplate no significant disc space narrowing posterior vertebrae is intact Mild mid cervical degenerative changes Overall impression: Mild cervical spondylosis and cervical straightening     Assessment and plan  Encounter Diagnoses  Name Primary?   Radicular pain of right upper extremity    Cervical paraspinal muscle spasm Yes    She has some of cervical spondylosis but this seems to be related to muscle spasm  Continue ibuprofen  and gabapentin   Appetizing adding start Robaxin  Out of work a couple more days return to work Wednesday follow-up as needed  Meds ordered this encounter  Medications   methocarbamol  (ROBAXIN ) 750 MG tablet    Sig: Take 1 tablet (750 mg total) by mouth 4 (four) times daily.    Dispense:  60 tablet    Refill:  2

## 2023-08-23 NOTE — Telephone Encounter (Signed)
 Robaxin  not on Formulary with Medicaid  Requesting something that is covered Tizanidine  Baclofen sometimes Flexeril  10

## 2023-08-23 NOTE — Patient Instructions (Addendum)
 Work note out 08/22/23 through 08/27/23 return to work full duty on 08/28/23

## 2023-08-27 ENCOUNTER — Telehealth: Payer: Self-pay | Admitting: Orthopedic Surgery

## 2023-08-27 NOTE — Telephone Encounter (Signed)
 Dr. Areatha pt - pt lvm stating that the pill is not working and the pain is getting worse.  She would like a call back.  [phone]

## 2023-08-27 NOTE — Telephone Encounter (Signed)
 I called her she needs something else the meds are not helping She wants note to be out of work until the end of next week

## 2023-08-28 ENCOUNTER — Other Ambulatory Visit: Payer: Self-pay | Admitting: Orthopedic Surgery

## 2023-08-28 DIAGNOSIS — M62838 Other muscle spasm: Secondary | ICD-10-CM

## 2023-08-28 MED ORDER — GABAPENTIN 300 MG PO CAPS: 300.0000 mg | ORAL_CAPSULE | Freq: Three times a day (TID) | ORAL | 5 refills | Status: AC

## 2023-08-28 MED ORDER — TIZANIDINE HCL 4 MG PO TABS: 4.0000 mg | ORAL_TABLET | Freq: Four times a day (QID) | ORAL | 1 refills | Status: AC | PRN

## 2023-08-28 NOTE — Telephone Encounter (Signed)
I called her to advise she has voiced understanding  

## 2023-09-09 ENCOUNTER — Ambulatory Visit (INDEPENDENT_AMBULATORY_CARE_PROVIDER_SITE_OTHER): Admitting: Nurse Practitioner

## 2023-09-09 VITALS — BP 117/76 | HR 72 | Temp 98.2°F | Ht 63.0 in | Wt 164.0 lb

## 2023-09-09 DIAGNOSIS — L02234 Carbuncle of groin: Secondary | ICD-10-CM

## 2023-09-09 MED ORDER — SULFAMETHOXAZOLE-TRIMETHOPRIM 800-160 MG PO TABS: 1.0000 | ORAL_TABLET | Freq: Two times a day (BID) | ORAL | 0 refills | Status: DC

## 2023-09-09 NOTE — Progress Notes (Signed)
   Subjective:    Patient ID: Stacy Wolf, female    DOB: March 31, 1975, 48 y.o.   MRN: [id number]   Chief Complaint: Boil in groin area   HPI  Boil in groin area. Has been there several days. Has had in the past and had to be lanced. Denies any drainage, just  painful. Patient Active Problem List   Diagnosis Date Noted   BV (bacterial vaginosis) 08/19/2023   Urinary frequency 08/19/2023   Status post partial hysterectomy 08/19/2020   Current use of estrogen therapy 01/20/2019   Posttraumatic stress disorder 10/17/2016   Personality disorder (HCC) 10/17/2016   Menorrhagia with irregular cycle 08/13/2016   Obesity (BMI 30.0-34.9) 04/23/2016   Essential hypertension, benign 01/16/2016   Migraine headache 04/18/2015   Tobacco abuse 02/02/2015   Gastroesophageal reflux disease without esophagitis 08/24/2014   Hyperlipidemia LDL goal <100 09/14/2013   Bipolar disorder (HCC) 08/25/2013        Review of Systems  Constitutional:  Negative for diaphoresis.  Eyes:  Negative for pain.  Respiratory:  Negative for shortness of breath.   Cardiovascular:  Negative for chest pain, palpitations and leg swelling.  Gastrointestinal:  Negative for abdominal pain.  Endocrine: Negative for polydipsia.  Skin:  Negative for rash.  Neurological:  Negative for dizziness, weakness and headaches.  Hematological:  Does not bruise/bleed easily.  All other systems reviewed and are negative.      Objective:   Physical Exam Constitutional:      Appearance: Normal appearance.  Cardiovascular:     Rate and Rhythm: Normal rate and regular rhythm.     Heart sounds: Normal heart sounds.  Pulmonary:     Breath sounds: Normal breath sounds.  Skin:    General: Skin is warm.     Comments: Erythematous indurated lesion right groin area. No drainage. Tender to touch.  Neurological:     General: No focal deficit present.     Mental Status: She is alert and oriented to person, place, and time.   Psychiatric:        Mood and Affect: Mood normal.        Behavior: Behavior normal.     BP 117/76   Pulse 72   Temp 98.2 F (36.8 C) (Temporal)   Ht 5' 3 (1.6 m)   Wt 164 lb (74.4 kg)   LMP 08/10/2016 Comment: SCH  SpO2 97%   BMI 29.05 kg/m        Assessment & Plan:   Stacy Wolf in today with chief complaint of Boil in groin area   1. Carbuncle, groin (Primary) Warm compresses RTO prn - sulfamethoxazole -trimethoprim  (BACTRIM  DS) 800-160 MG tablet; Take 1 tablet by mouth 2 (two) times daily.  Dispense: 20 tablet; Refill: 0    The above assessment and management plan was discussed with the patient. The patient verbalized understanding of and has agreed to the management plan. Patient is aware to call the clinic if symptoms persist or worsen. Patient is aware when to return to the clinic for a follow-up visit. Patient educated on when it is appropriate to go to the emergency department.   Mary-Margaret Gladis, FNP

## 2023-09-09 NOTE — Patient Instructions (Signed)
Skin Abscess  A skin abscess is an infected spot of skin. It can have pus in it. An abscess can happen in any part of your body. Some abscesses break open (rupture) on their own. Most keep getting worse unless they are treated. If your abscess is not treated, the infection can spread deeper into your body and blood. This can make you feel sick. What are the causes? Germs that enter your skin. This may happen if you have: A cut or scrape. A wound from a needle or an insect bite. Blocked oil or sweat glands. A problem with the spot where your hair goes into your skin. A fluid-filled sac called a cyst under your skin. What increases the risk? Having problems with how your blood moves through your body. Having a weak body defense system (immune system). Having diabetes. Having dry and irritated skin. Needing to get shots often. Putting drugs into your body with a needle. Having a splinter or something else in your skin. Smoking. What are the signs or symptoms? A firm bump under your skin that hurts. A bump with pus at the top. Redness and swelling. Warm or tender spots. A sore on the skin. How is this treated? You may need to: Put a heat pack or a warm, wet washcloth on the spot. Have the pus drained. Take antibiotics. Follow these instructions at home: Medicines Take over-the-counter and prescription medicines only as told by your doctor. If you were prescribed antibiotics, take them as told by your doctor. Do not stop taking them even if you start to feel better. Abscess care  If you have an abscess that has not drained, put heat on it. Use the heat source that your doctor recommends, such as a moist heat pack or a heating pad. Place a towel between your skin and the heat source. Leave the heat on for 20-30 minutes. If your skin turns bright red, take off the heat right away to prevent burns. The risk of burns is higher if you cannot feel pain, heat, or cold. Follow  instructions from your doctor about how to take care of your abscess. Make sure you: Cover the abscess with a bandage. Wash your hands with soap and water for at least 20 seconds before and after you change your bandage. If you cannot use soap and water, use hand sanitizer. Change your bandage as told by your doctor. Check your abscess every day for signs that the infection is getting worse. Check for: More redness, swelling, or pain. More fluid or blood. Warmth. More pus or a worse smell. General instructions To keep the infection from spreading: Do not share personal items or towels. Do not go in a hot tub with others. Avoid making skin contact with others. Be careful when you get rid of used bandages or any pus from the abscess. Do not smoke or use any products that contain nicotine or tobacco. If you need help quitting, ask your doctor. Contact a doctor if: You see red streaks on your skin near the abscess. You have any signs of worse infection. You vomit every time you eat or drink. You have a fever, chills, or muscle aches. The cyst or abscess comes back. Get help right away if: You have very bad pain. You make less pee (urine) than normal. This information is not intended to replace advice given to you by your health care provider. Make sure you discuss any questions you have with your health care provider. Document Revised: 10/04/2021  Document Reviewed: 10/04/2021 Elsevier Patient Education  2024 [employer].

## 2023-09-16 ENCOUNTER — Ambulatory Visit: Admitting: Orthopedic Surgery

## 2023-09-19 ENCOUNTER — Telehealth: Payer: Self-pay | Admitting: Orthopedic Surgery

## 2023-09-19 NOTE — Telephone Encounter (Signed)
 Per Stacy Wolf pt submitted forms and paid $20.00 payment

## 2023-10-28 ENCOUNTER — Encounter: Payer: Self-pay | Admitting: Nurse Practitioner

## 2023-10-28 ENCOUNTER — Ambulatory Visit: Admitting: Nurse Practitioner

## 2023-10-28 VITALS — BP 131/78 | HR 63 | Temp 97.0°F | Ht 63.0 in | Wt 171.6 lb

## 2023-10-28 DIAGNOSIS — B3731 Acute candidiasis of vulva and vagina: Secondary | ICD-10-CM

## 2023-10-28 DIAGNOSIS — B9689 Other specified bacterial agents as the cause of diseases classified elsewhere: Secondary | ICD-10-CM | POA: Diagnosis not present

## 2023-10-28 DIAGNOSIS — N760 Acute vaginitis: Secondary | ICD-10-CM

## 2023-10-28 DIAGNOSIS — N898 Other specified noninflammatory disorders of vagina: Secondary | ICD-10-CM

## 2023-10-28 LAB — WET PREP FOR TRICH, YEAST, CLUE
Clue Cell Exam: POSITIVE — AB
Trichomonas Exam: NEGATIVE
Yeast Exam: POSITIVE — AB

## 2023-10-28 MED ORDER — METRONIDAZOLE 500 MG PO TABS: 500.0000 mg | ORAL_TABLET | Freq: Two times a day (BID) | ORAL | 0 refills | Status: DC

## 2023-10-28 NOTE — Progress Notes (Signed)
 Subjective:  Patient ID: Stacy Wolf, female    DOB: 03-Feb-1976, 48 y.o.   MRN: [id number]  Patient Care Team: Dettinger, Fonda LABOR, MD as PCP - General (Family Medicine)   Chief Complaint:  Vaginal Discharge (Started Saturday )   HPI: Stacy Wolf is a 48 y.o. female presenting on 10/28/2023 for Vaginal Discharge (Started Saturday )   Discussed the use of AI scribe software for clinical note transcription with the patient, who gave verbal consent to proceed.  History of Present Illness Stacy Wolf is a 48 year old female who presents with vaginal discharge since Saturday.  She has been experiencing vaginal discharge that began on Saturday, initially regular on Friday but changing to thick clumps by Saturday. The discharge is white in color with no associated odor.  She mentions a recent change in soap as the only alteration in her routine. She has not tried any over-the-counter treatments for the discharge.  No new sexual partners. No burning, itching, or irritation associated with the discharge. Her past medical history includes a previous yeast infection treated in June.  A wet prep has been completed, but she has not provided a urine sample yet.      Relevant past medical, surgical, family, and social history reviewed and updated as indicated.  Allergies and medications reviewed and updated. Data reviewed: Chart in Epic.   Past Medical History:  Diagnosis Date   Anxiety    on meds   Bipolar 1 disorder (HCC)    Blood transfusion without reported diagnosis 2000   Depression    on meds   GERD (gastroesophageal reflux disease)    on meds   Glaucoma    LEFT eye- not a surgical candidate at this time (02/06/2021)   Heart murmur    History of kidney stones    Hypertension    on meds   Migraines    Schizophrenia (HCC)    Seasonal allergies     Past Surgical History:  Procedure Laterality Date   BILATERAL SALPINGECTOMY Bilateral 08/29/2016   Procedure:  BILATERAL SALPINGECTOMY;  Surgeon: Jayne Vonn DEL, MD;  Location: [facility];  Service: Gynecology;  Laterality: Bilateral;   CARPAL TUNNEL RELEASE Left 04/11/2016   Procedure: CARPAL TUNNEL RELEASE;  Surgeon: Taft FORBES Minerva, MD;  Location: [facility];  Service: Orthopedics;  Laterality: Left;   CARPAL TUNNEL RELEASE Right 03/13/2018   Procedure: RIGHT CARPAL TUNNEL RELEASE;  Surgeon: Minerva Taft FORBES, MD;  Location: [facility];  Service: Orthopedics;  Laterality: Right;   KNEE ARTHROSCOPY WITH LATERAL MENISECTOMY Right 09/19/2021   Procedure: KNEE ARTHROSCOPY WITH LATERAL MENISCECTOMY;  Surgeon: Minerva Taft FORBES, MD;  Location: [facility];  Service: Orthopedics;  Laterality: Right;   SUPRACERVICAL ABDOMINAL HYSTERECTOMY N/A 08/29/2016   Procedure: HYSTERECTOMY SUPRACERVICAL ABDOMINAL;  Surgeon: Jayne Vonn DEL, MD;  Location: [facility];  Service: Gynecology;  Laterality: N/A;   TRIGGER FINGER RELEASE Right 11/18/2018   Procedure: RELEASE TRIGGER FINGER/A-1 PULLEY right ring;  Surgeon: Minerva Taft FORBES, MD;  Location: [facility];  Service: Orthopedics;  Laterality: Right;   TUBAL LIGATION     WISDOM TOOTH EXTRACTION     WRIST SURGERY Right 2006   tendonitis    Social History   Socioeconomic History   Marital status: Divorced    Spouse name: Not on file   Number of children: 3   Years of education: Not on file   Highest education level: 11th grade  Occupational History   Not  on file  Tobacco Use   Smoking status: Every Day    Current packs/day: 2.00    Average packs/day: 2.0 packs/day for 7.0 years (14.0 ttl pk-yrs)    Types: Cigars, Cigarettes   Smokeless tobacco: Never   Tobacco comments:    1 cigar per day  Vaping Use   Vaping status: Never Used  Substance and Sexual Activity   Alcohol use: Not Currently    Alcohol/week: 0.0 - 1.0 standard drinks of alcohol    Comment: occasionally   Drug use: Yes    Frequency: 4.0 times per week    Types: Marijuana    Comment: 09/15/21   Sexual  activity: Yes    Birth control/protection: Surgical    Comment: supracervical hyst  Other Topics Concern   Not on file  Social History Narrative   Not on file   Social Drivers of Health   Financial Resource Strain: High Risk (09/09/2023)   Overall Financial Resource Strain (CARDIA)    Difficulty of Paying Living Expenses: Very hard  Food Insecurity: Food Insecurity Present (09/09/2023)   Hunger Vital Sign    Worried About Running Out of Food in the Last Year: Often true    Ran Out of Food in the Last Year: Often true  Transportation Needs: No Transportation Needs (09/09/2023)   PRAPARE - Administrator, Civil Service (Medical): No    Lack of Transportation (Non-Medical): No  Physical Activity: Insufficiently Active (09/09/2023)   Exercise Vital Sign    Days of Exercise per Week: 1 day    Minutes of Exercise per Session: 40 min  Stress: Stress Concern Present (09/09/2023)   [employer] of Occupational Health - Occupational Stress Questionnaire    Feeling of Stress: To some extent  Social Connections: Socially Isolated (09/09/2023)   Social Connection and Isolation Panel    Frequency of Communication with Friends and Family: More than three times a week    Frequency of Social Gatherings with Friends and Family: Once a week    Attends Religious Services: Never    Database administrator or Organizations: No    Attends Engineer, structural: Not on file    Marital Status: Divorced  Intimate Partner Violence: Not At Risk (04/25/2017)   Humiliation, Afraid, Rape, and Kick questionnaire    Fear of Current or Ex-Partner: No    Emotionally Abused: No    Physically Abused: No    Sexually Abused: No    Outpatient Encounter Medications as of 10/28/2023  Medication Sig   amLODipine  (NORVASC ) 5 MG tablet Take 1 tablet (5 mg total) by mouth daily.   estradiol  (ESTRACE ) 2 MG tablet Take 1 tablet (2 mg total) by mouth daily.   fluconazole  (DIFLUCAN ) 150 MG tablet Take 1  tablet (150 mg total) by mouth daily.   fluticasone  (FLONASE ) 50 MCG/ACT nasal spray Place 1 spray into both nostrils 2 (two) times daily as needed for allergies or rhinitis.   gabapentin  (NEURONTIN ) 300 MG capsule Take 1 capsule (300 mg total) by mouth 3 (three) times daily.   ibuprofen  (ADVIL ) 800 MG tablet TAKE 1 TABLET BY MOUTH EVERY 8 HOURS AS NEEDED   lansoprazole  (PREVACID  SOLUTAB) 15 MG disintegrating tablet Take by mouth.   lisinopril  (ZESTRIL ) 5 MG tablet Take 1 tablet (5 mg total) by mouth daily.   methocarbamol  (ROBAXIN ) 750 MG tablet Take 1 tablet (750 mg total) by mouth 4 (four) times daily.   rosuvastatin  (CRESTOR ) 5 MG tablet Take 1  tablet (5 mg total) by mouth daily.   sulfamethoxazole -trimethoprim  (BACTRIM  DS) 800-160 MG tablet Take 1 tablet by mouth 2 (two) times daily.   tiZANidine  (ZANAFLEX ) 4 MG tablet Take 1 tablet (4 mg total) by mouth every 6 (six) hours as needed for muscle spasms.   No facility-administered encounter medications on file as of 10/28/2023.    Allergies  Allergen Reactions   Imitrex  [Sumatriptan ] Anaphylaxis and Other (See Comments)    Could not swallow after taking med   Latuda [Lurasidone Hcl] Other (See Comments)    Suicidal thoughts    Wellbutrin [Bupropion] Other (See Comments)    Seizures / lowered seizure threshold   Zoloft [Sertraline Hcl] Hives   Xanax [Alprazolam] Other (See Comments)    Passed out   Haloperidol And Related Other (See Comments)    Leg cramps   Nicoderm [Nicotine] Dermatitis    Pertinent ROS per HPI, otherwise unremarkable      Objective:  BP 131/78   Pulse 63   Temp (!) 97 F (36.1 C) (Temporal)   Ht 5' 3 (1.6 m)   Wt 171 lb 9.6 oz (77.8 kg)   LMP 08/10/2016 Comment: SCH  SpO2 97%   BMI 30.40 kg/m    Wt Readings from Last 3 Encounters:  10/28/23 171 lb 9.6 oz (77.8 kg)  09/09/23 164 lb (74.4 kg)  08/23/23 161 lb (73 kg)    Physical Exam Vitals and nursing note reviewed.  Constitutional:       General: She is not in acute distress. HENT:     Head: Normocephalic and atraumatic.     Nose: Nose normal.  Eyes:     General: No scleral icterus.    Extraocular Movements: Extraocular movements intact.     Conjunctiva/sclera: Conjunctivae normal.     Pupils: Pupils are equal, round, and reactive to light.  Cardiovascular:     Heart sounds: Normal heart sounds.  Pulmonary:     Effort: Pulmonary effort is normal.     Breath sounds: Normal breath sounds.  Abdominal:     Palpations: Abdomen is soft.     Tenderness: There is no abdominal tenderness. There is no right CVA tenderness, left CVA tenderness or rebound.  Musculoskeletal:        General: Normal range of motion.     Right lower leg: No edema.     Left lower leg: No edema.  Skin:    General: Skin is warm and dry.     Findings: No rash.  Neurological:     Mental Status: She is alert and oriented to person, place, and time.  Psychiatric:        Mood and Affect: Mood normal.        Behavior: Behavior normal.        Thought Content: Thought content normal.        Judgment: Judgment normal.    Physical Exam      Results for orders placed or performed in visit on 08/19/23  WET PREP FOR TRICH, YEAST, CLUE   Collection Time: 08/19/23  9:01 AM   Specimen: Vaginal Swab   Vaginal Swab  Result Value Ref Range   Trichomonas Exam Negative Negative   Yeast Exam Negative Negative   Clue Cell Exam Positive (A) Negative  Microscopic Examination   Collection Time: 08/19/23  9:01 AM   Urine  Result Value Ref Range   WBC, UA None seen 0 - 5 /hpf   RBC, Urine 0-2 0 - 2 /  hpf   Epithelial Cells (non renal) None seen 0 - 10 /hpf   Renal Epithel, UA None seen None seen /hpf   Bacteria, UA None seen None seen/Few   Yeast, UA None seen None seen  Urinalysis, Routine w reflex microscopic   Collection Time: 08/19/23  9:01 AM  Result Value Ref Range   Specific Gravity, UA 1.010 1.005 - 1.030   pH, UA 6.5 5.0 - 7.5   Color, UA  Yellow Yellow   Appearance Ur Clear Clear   Leukocytes,UA Negative Negative   Protein,UA Negative Negative/Trace   Glucose, UA Negative Negative   Ketones, UA Negative Negative   RBC, UA Trace (A) Negative   Bilirubin, UA Negative Negative   Urobilinogen, Ur 0.2 0.2 - 1.0 mg/dL   Nitrite, UA Negative Negative   Microscopic Examination See below:        Pertinent labs & imaging results that were available during my care of the patient were reviewed by me and considered in my medical decision making.  Assessment & Plan:  Camaria was seen today for vaginal discharge.  Diagnoses and all orders for this visit:  Vaginal discharge -     Urinalysis, Routine w reflex microscopic -     WET PREP FOR TRICH, YEAST, CLUE    Anitria is a 48 yrs old [place] American female seen today for BVl , no acute distress Assessment and Plan Assessment & Plan Vaginal discharge White, thick clumpy discharge suggests possible yeast infection. - unable to Obtain urinalysis.  Microscopic wet-mount exam shows clue cells, 5-10 white blood cells, yeast, bacteria moderate & epithelial cell  many.   Continue all other maintenance medications.  Prevention & Self-Care:  Avoid douching, as it disrupts healthy vaginal bacteria.  Limit scented soaps, sprays, or bubble baths in the genital area.  Use condoms to help lower risk of recurrence.  Wear breathable cotton underwear and avoid tight clothing.  BV can recur--return for care if symptoms come back.  Sexual health: BV can increase risk of other infections (like STIs, HIV). Partners usually do not need treatment unless recurrent infections occur.  When to call the provider: if symptoms do not improve after treatment, if they worsen, or if you develop pelvic pain, fever, or unusual vaginal bleeding.  Follow up plan: Return if symptoms worsen or fail to improve.   Continue healthy lifestyle choices, including diet (rich in fruits, vegetables, and lean  proteins, and low in salt and simple carbohydrates) and exercise (at least 30 minutes of moderate physical activity daily).  Educational handout given for Bacterial Vaginosis  The above assessment and management plan was discussed with the patient. The patient verbalized understanding of and has agreed to the management plan. Patient is aware to call the clinic if they develop any new symptoms or if symptoms persist or worsen. Patient is aware when to return to the clinic for a follow-up visit. Patient educated on when it is appropriate to go to the emergency department.  Talisa Petrak [place] Thompson, DNP [facility] [address] [place], [place] [address] [phone]

## 2023-11-01 ENCOUNTER — Telehealth: Payer: Self-pay | Admitting: Family Medicine

## 2023-11-01 NOTE — Telephone Encounter (Signed)
 Copied from CRM [id number]. Topic: Clinical - Medical Advice >> Nov 01, 2023  4:11 PM Sophia H wrote: Reason for CRM: Patient states she was given an antibiotic to take for a yeast infection, since starting the medication she states it has gotten worse. Wanting to know next steps, if anything else can be sent in on her behalf. Please advise # [phone].  [id number] - [place], [place] - [address]

## 2023-11-01 NOTE — Telephone Encounter (Signed)
Diflucan was sent in for patient.

## 2023-11-05 ENCOUNTER — Ambulatory Visit: Payer: Self-pay

## 2023-11-05 ENCOUNTER — Other Ambulatory Visit: Payer: Self-pay | Admitting: Nurse Practitioner

## 2023-11-05 MED ORDER — FLUCONAZOLE 150 MG PO TABS: 150.0000 mg | ORAL_TABLET | Freq: Every day | ORAL | 0 refills | Status: DC

## 2023-11-05 NOTE — Telephone Encounter (Signed)
 FYI Only or Action Required?: Action required by provider: follow up on condition/symptoms and requesting prescription.  Patient was last seen in primary care on 10/28/2023 by Stacy Morton Sebastian Nena, NP.  Called Nurse Triage reporting Vaginal Discharge and vaginal irritation.  Symptoms began several days ago.  Interventions attempted: Prescription medications: Flagyl .  Symptoms are: vaginal discharge (thick and white), vaginal itching and irritation stable.  Triage Disposition: Call PCP Now (overriding Home Care)  Patient/caregiver understands and will follow disposition?: Yes               Message from [facility] sent at 11/05/2023  7:35 AM EDT  Summary: peronal discomfort / rx req   The patient shares that they have recently been prescribed an antibiotic and are now beginning to experience vaginal discharge and irritation for roughly 3 days. The patient would like to be prescribed something to help with their concerns.         Reason for Disposition  Symptoms of a vaginal yeast infection (i.e., white, thick, cottage-cheese-like, itchy, not bad smelling discharge)  Answer Assessment - Initial Assessment Questions Patient states when she takes antibiotics she gets a yeast infection. She states she thought she was being treated for yeast infection from her visit on 10/28/23 and states she showed up to the pharmacy it was an antibiotic instead.  1. DISCHARGE: Describe the discharge. (e.g., white, yellow, green, gray, foamy, cottage cheese-like)     Thick, white, looks like tissue.  2. ODOR: Is there a bad odor?     No.  3. ONSET: When did the discharge begin?     X 3 days.  4. RASH: Is there a rash in the genital area? If Yes, ask: Describe it. (e.g., redness, blisters, sores, bumps)     No.  5. ABDOMEN PAIN: Are you having any abdomen pain? If Yes, ask: What does it feel like?  (e.g., crampy, dull, intermittent, constant)      No.  6. ABDOMEN PAIN  SEVERITY: If present, ask: How bad is it? (e.g., Scale 1-10; mild, moderate, or severe)     0/10.  7. CAUSE: What do you think is causing the discharge? Have you had the same problem before? What happened then?     She states she has had yeast infections in the past and this feels like it. She states today is the last day of her antibiotic.  8. OTHER SYMPTOMS: Do you have any other symptoms? (e.g., fever, itching, urination pain, vaginal bleeding, vaginal foreign body)     Itching, irritation, discomfort when urinating. Denies vaginal bleeding, fever.  9. PREGNANCY: Is there any chance you are pregnant? When was your last menstrual period?     No menstrual periods due to hysterectomy.  Protocols used: Vaginal Discharge-A-AH

## 2023-11-05 NOTE — Progress Notes (Signed)
 Prescription for Diflucan  sent to the client pharmacy as prophylactic treatment post ATB

## 2023-11-05 NOTE — Telephone Encounter (Signed)
 Pt aware and verbalized understanding.

## 2023-11-27 ENCOUNTER — Encounter: Payer: Self-pay | Admitting: Orthopedic Surgery

## 2023-11-27 ENCOUNTER — Ambulatory Visit (INDEPENDENT_AMBULATORY_CARE_PROVIDER_SITE_OTHER): Payer: MEDICAID | Admitting: Orthopedic Surgery

## 2023-11-27 ENCOUNTER — Other Ambulatory Visit (INDEPENDENT_AMBULATORY_CARE_PROVIDER_SITE_OTHER): Payer: Self-pay

## 2023-11-27 VITALS — BP 121/77 | HR 78 | Ht 63.0 in | Wt 178.0 lb

## 2023-11-27 DIAGNOSIS — M25531 Pain in right wrist: Secondary | ICD-10-CM

## 2023-11-27 NOTE — Progress Notes (Signed)
 New Patient Visit  Assessment: Stacy Wolf is a 48 y.o. female with the following: 1. Pain in right wrist  Plan: Stacy Wolf injured her right wrist and forearm earlier today at work.  She lifted a box, and felt a pulling sensation.  She has continued to have some pain.  She noted some swelling immediately.  Radiographs are normal.  She has mild swelling on the dorsal aspect of the wrist and forearm in clinic this afternoon.  Provided reassurance.  She may have irritated the tendon or tendon sheath, but she is otherwise able to extend her wrist, as well as her fingers.  She states understanding.  Continue with medication such as Tylenol  or ibuprofen .  If she continues to have issues, she can return to clinic.  She was fitted for a brace.  We can consider prednisone  in the future.  Follow-up: Return if symptoms worsen or fail to improve.  Subjective:  Chief Complaint  Patient presents with   Arm Pain    R wrist forearm after pulling a box approx 4 a.m. this morning. Pt felt a pop and has had severe pain since.     History of Present Illness: Stacy Wolf is a 48 y.o. female who presents for evaluation of right wrist pain.  She was at work earlier today, when she lifted a box.  She stated that she waited for a long time for someone else to lift and then decided she would take care of it.  After lifting the box, she noted a pulling sensation.  This was followed by some pain and swelling.  Since then, the swelling has improved however she continues to have pain and tenderness.  She has gabapentin  and ibuprofen  at home, but she has not been able to take this yet.  She denies see any other providers.  No numbness or tingling.   Review of Systems: No fevers or chills No numbness or tingling No chest pain No shortness of breath No bowel or bladder dysfunction No GI distress No headaches   Medical History:  Past Medical History:  Diagnosis Date   Anxiety    on meds   Bipolar 1  disorder (HCC)    Blood transfusion without reported diagnosis 2000   Depression    on meds   GERD (gastroesophageal reflux disease)    on meds   Glaucoma    LEFT eye- not a surgical candidate at this time (02/06/2021)   Heart murmur    History of kidney stones    Hypertension    on meds   Migraines    Schizophrenia (HCC)    Seasonal allergies     Past Surgical History:  Procedure Laterality Date   BILATERAL SALPINGECTOMY Bilateral 08/29/2016   Procedure: BILATERAL SALPINGECTOMY;  Surgeon: Stacy Vonn DEL, MD;  Location: [facility];  Service: Gynecology;  Laterality: Bilateral;   CARPAL TUNNEL RELEASE Left 04/11/2016   Procedure: CARPAL TUNNEL RELEASE;  Surgeon: Stacy Wolf Minerva, MD;  Location: [facility];  Service: Orthopedics;  Laterality: Left;   CARPAL TUNNEL RELEASE Right 03/13/2018   Procedure: RIGHT CARPAL TUNNEL RELEASE;  Surgeon: Wolf Stacy FORBES, MD;  Location: [facility];  Service: Orthopedics;  Laterality: Right;   KNEE ARTHROSCOPY WITH LATERAL MENISECTOMY Right 09/19/2021   Procedure: KNEE ARTHROSCOPY WITH LATERAL MENISCECTOMY;  Surgeon: Wolf Stacy FORBES, MD;  Location: [facility];  Service: Orthopedics;  Laterality: Right;   SUPRACERVICAL ABDOMINAL HYSTERECTOMY N/A 08/29/2016   Procedure: HYSTERECTOMY SUPRACERVICAL ABDOMINAL;  Surgeon: Stacy,  Vonn DEL, MD;  Location: [facility];  Service: Gynecology;  Laterality: N/A;   TRIGGER FINGER RELEASE Right 11/18/2018   Procedure: RELEASE TRIGGER FINGER/A-1 PULLEY right ring;  Surgeon: Stacy Stacy BRAVO, MD;  Location: [facility];  Service: Orthopedics;  Laterality: Right;   TUBAL LIGATION     WISDOM TOOTH EXTRACTION     WRIST SURGERY Right 2006   tendonitis    Family History  Problem Relation Age of Onset   Breast cancer Mother    Cancer Mother 54       breast CA at 19 and uterine CA at 19   Hypertension Mother    Kidney disease Mother        kidney transplant   Diabetes Father    Liver disease Father    Breast cancer Sister     Cancer Sister 3       uterine   Cancer Sister 67       uterine   Breast cancer Maternal Aunt    Cancer Maternal Aunt        breast   Colon polyps Neg Hx    Colon cancer Neg Hx    Esophageal cancer Neg Hx    Rectal cancer Neg Hx    Stomach cancer Neg Hx    Social History   Tobacco Use   Smoking status: Every Day    Current packs/day: 2.00    Average packs/day: 2.0 packs/day for 7.0 years (14.0 ttl pk-yrs)    Types: Cigars, Cigarettes   Smokeless tobacco: Never   Tobacco comments:    1 cigar per day  Vaping Use   Vaping status: Never Used  Substance Use Topics   Alcohol use: Not Currently    Alcohol/week: 0.0 - 1.0 standard drinks of alcohol    Comment: occasionally   Drug use: Yes    Frequency: 4.0 times per week    Types: Marijuana    Comment: 09/15/21    Allergies  Allergen Reactions   Imitrex  [Sumatriptan ] Anaphylaxis and Other (See Comments)    Could not swallow after taking med   Latuda [Lurasidone Hcl] Other (See Comments)    Suicidal thoughts    Wellbutrin [Bupropion] Other (See Comments)    Seizures / lowered seizure threshold   Zoloft [Sertraline Hcl] Hives   Xanax [Alprazolam] Other (See Comments)    Passed out   Haloperidol And Related Other (See Comments)    Leg cramps   Nicoderm [Nicotine] Dermatitis     Objective: BP 121/77   Pulse 78   Ht 5' 3 (1.6 m)   Wt 178 lb (80.7 kg)   LMP 08/10/2016 Comment: SCH  BMI 31.53 kg/m   Physical Exam:  General: Alert and oriented. and No acute distress. Gait: Normal gait.  Right wrist with mild dorsal swelling.  No bruising.  She is able to extend her wrist actively, as well as all fingers.  She can make a full fist, but has some pain.  Fingers warm well-perfused.  Sensation intact throughout the right hand.  IMAGING: I personally ordered and reviewed the following images  X-rays of the right wrist were obtained in clinic today.  No acute injuries are noted.  Well-maintained joint space.  No  dislocation.  No evidence of a prior fracture.  No bony lesions.  Good bone quality.  Impression: Negative right wrist x-ray.   New Medications:  No orders of the defined types were placed in this encounter.     Stacy DELENA Horde, MD  11/27/2023 3:06 PM

## 2023-11-27 NOTE — Patient Instructions (Signed)
Hand Exercises  Hand exercises can be helpful for almost anyone. These exercises can strengthen the hands, improve flexibility and movement, and increase blood flow to the hands. These results can make work and daily tasks easier. Hand exercises can be especially helpful for people who have joint pain from arthritis or have nerve damage from overuse (carpal tunnel syndrome). These exercises can also help people who have injured a hand.  Exercises Most of these hand exercises are gentle stretching and motion exercises. It is usually safe to do them often throughout the day. Warming up your hands before exercise may help to reduce stiffness. You can do this with gentle massage or by placing your hands in warm water for 10-15 minutes. It is normal to feel some stretching, pulling, tightness, or mild discomfort as you begin new exercises. This will gradually improve. Stop an exercise right away if you feel sudden, severe pain or your pain gets worse. Ask your health care provider which exercises are best for you. Knuckle bend or "claw" fist Stand or sit with your arm, hand, and all five fingers pointed straight up. Make sure to keep your wrist straight during the exercise. Gently bend your fingers down toward your palm until the tips of your fingers are touching the top of your palm. Keep your big knuckle straight and just bend the small knuckles in your fingers. Hold this position for 10 seconds. Straighten (extend) your fingers back to the starting position. Repeat this exercise 5-10 times with each hand. Full finger fist Stand or sit with your arm, hand, and all five fingers pointed straight up. Make sure to keep your wrist straight during the exercise. Gently bend your fingers into your palm until the tips of your fingers are touching the middle of your palm. Hold this position for 10 seconds. Extend your fingers back to the starting position, stretching every joint fully. Repeat this exercise  5-10 times with each hand. Straight fist Stand or sit with your arm, hand, and all five fingers pointed straight up. Make sure to keep your wrist straight during the exercise. Gently bend your fingers at the big knuckle, where your fingers meet your hand, and the middle knuckle. Keep the knuckle at the tips of your fingers straight and try to touch the bottom of your palm. Hold this position for 10 seconds. Extend your fingers back to the starting position, stretching every joint fully. Repeat this exercise 5-10 times with each hand. Tabletop Stand or sit with your arm, hand, and all five fingers pointed straight up. Make sure to keep your wrist straight during the exercise. Gently bend your fingers at the big knuckle, where your fingers meet your hand, as far down as you can while keeping the small knuckles in your fingers straight. Think of forming a tabletop with your fingers. Hold this position for 10 seconds. Extend your fingers back to the starting position, stretching every joint fully. Repeat this exercise 5-10 times with each hand. Finger spread Place your hand flat on a table with your palm facing down. Make sure your wrist stays straight as you do this exercise. Spread your fingers and thumb apart from each other as far as you can until you feel a gentle stretch. Hold this position for 10 seconds. Bring your fingers and thumb tight together again. Hold this position for 10 seconds. Repeat this exercise 5-10 times with each hand. Making circles Stand or sit with your arm, hand, and all five fingers pointed straight up. Make   sure to keep your wrist straight during the exercise. Make a circle by touching the tip of your thumb to the tip of your index finger. Hold for 10 seconds. Then open your hand wide. Repeat this motion with your thumb and each finger on your hand. Repeat this exercise 5-10 times with each hand. Thumb motion Sit with your forearm resting on a table and your wrist  straight. Your thumb should be facing up toward the ceiling. Keep your fingers relaxed as you move your thumb. Lift your thumb up as high as you can toward the ceiling. Hold for 10 seconds. Bend your thumb across your palm as far as you can, reaching the tip of your thumb for the small finger (pinkie) side of your palm. Hold for 10 seconds. Repeat this exercise 5-10 times with each hand. Grip strengthening Hold a stress ball or other soft ball in the middle of your hand. Slowly increase the pressure, squeezing the ball as much as you can without causing pain. Think of bringing the tips of your fingers into the middle of your palm. All of your finger joints should bend when doing this exercise. Hold your squeeze for 10 seconds, then relax. Repeat this exercise 5-10 times with each hand.   Contact a health care provider if: Your hand pain or discomfort gets much worse when you do an exercise. Your hand pain or discomfort does not improve within 2 hours after you exercise. If you have any of these problems, stop doing these exercises right away. Do not do them again unless your health care provider says that you can.    Get help right away if: You develop sudden, severe hand pain or swelling. If this happens, stop doing these exercises right away. Do not do them again unless your health care provider says that you can. This information is not intended to replace advice given to you by your health care provider. Make sure you discuss any questions you have with your health care provider.     Wrist Fracture Rehab Ask your health care provider which exercises are safe for you. Do exercises exactly as told by your health care provider and adjust them as directed. It is normal to feel mild stretching, pulling, tightness, or discomfort as you do these exercises. Stop right away if you feel sudden pain or your pain gets worse. Do not begin these exercises until told by your health care  provider. Stretching and range-of-motion exercises These exercises warm up your muscles and joints and improve the movement and flexibility of your wrist and hand. These exercises also help to relieve pain,numbness, and tingling. Finger flexion and extension Sit or stand with your elbow at your side. Open and stretch your left / right fingers as wide as you can (extension). Hold this position for 10 seconds. Close your left / right fingers into a gentle fist (flexion). Hold this position for 10 seconds. Slowly return to the starting position. Repeat 10 times. Complete this exercise 1-2 times a day. Wrist flexion Bend your left / right elbow to a 90-degree angle (right angle) with your palm facing the floor. Bend your wrist forward so your fingers point toward the floor (flexion). Hold this position for 10 seconds. Slowly return to the starting position. Repeat 10 times. Complete this exercise 1-2 times a day. Wrist extension Bend your left / right elbow to a 90-degree angle (right angle) with your palm facing the floor. Bend your wrist backward so your fingers point toward   the ceiling (extension). Hold this position for 10 seconds. Slowly return to the starting position. Repeat 10 times. Complete this exercise 1-2 times a day. Ulnar deviation Bend your left / right elbow to a 90-degree angle (right angle), and rest your forearm on a table with your palm facing down. Keeping your hand flat on the table, bend your left / right wrist toward your small finger (pinkie). This is ulnar deviation. Hold this position for 10 seconds. Slowly return to the starting position. Repeat 10 times. Complete this exercise 1-2 times a day. Radial deviation Bend your left / right elbow to a 90-degree angle (right angle), and rest your forearm on a table with your palm facing down. Keeping your hand flat on the table, bend your left / right wrist toward your thumb. This is radial deviation. Hold this  position for 10 seconds. Slowly return to the starting position. Repeat 10 times. Complete this exercise 1-2 times a day. Forearm rotation, supination Stand or sit with your left / right elbow bent to a 90-degree angle (right angle) at your side. Position your forearm so that the thumb is facing the ceiling (neutral position). Turn (rotate) your palm up toward the ceiling (supination), stopping when you feel a gentle stretch. Hold this position for 10 seconds. Slowly return to the starting position. Repeat 10 times. Complete this exercise 1-2 times a day. Forearm rotation, pronation Stand or sit with your left / right elbow bent to a 90-degree angle (right angle) at your side. Position your forearm so that the thumb is facing the ceiling (neutral position). Turn (rotate) your palm down toward the floor (pronation), stopping when you feel a gentle stretch. Hold this position for 10 seconds. Slowly return to the starting position. Repeat 10 times. Complete this exercise 1-2 times a day. Wrist flexion stretch  Extend your left / right arm in front of you and turn your palm down toward the floor. If told by your health care provider, bend your left / right arm to a 90-degree angle (right angle) at your side. Using your uninjured hand, gently press over the back of your left / right hand to bend your wrist and fingers toward the floor (flexion). Go as far as you can to feel a stretch without causing pain. Hold this position for 10 seconds. Slowly return to the starting position. Repeat 10 times. Complete this exercise 1-2 times a day. Wrist extension stretch  Extend your left / right arm in front of you and turn your palm up toward the ceiling. If told by your health care provider, bend your left / right arm to a 90-degree angle (right angle) at your side. Using your uninjured hand, gently press over the palm of your left / right hand to bend your wrist and fingers toward the floor (extension).  Go as far as you can to feel a stretch without causing pain. Hold this position for 10 seconds. Slowly return to the starting position. Repeat 10 times. Complete this exercise 1-2 times a day. Forearm rotation stretch, supination Stand or sit with your arms at your sides. Bend your left / right elbow to a 90-degree angle (right angle). Using your uninjured hand, turn your left / right palm up toward the ceiling (assisted supination) until you feel a gentle stretch in the inside of your forearm. Hold this position for 10 seconds. Slowly return to the starting position. Repeat 10 times. Complete this exercise 1-2 times a day. Forearm rotation stretch, pronation Stand or   sit with your arms at your sides. Bend your left / right elbow to a 90-degree angle (right angle). Using your uninjured hand, turn your left / right palm down toward the floor (assisted pronation) until you feel a gentle stretch in the top of your forearm. Hold this position for 10 seconds. Slowly return to the starting position. Repeat 10 times. Complete this exercise 1-2 times a day. Strengthening exercises These exercises build strength and endurance in your wrist and hand. Enduranceis the ability to use your muscles for a long time, even after they get tired. Wrist flexion Sit with your left / right forearm supported on a table. Your elbow should be at waist height. Rest your hand over the edge of the table, palm up. Gently grasp a 5 lb / kg weight (can of soup). Or, hold an exercise band or tube in both hands, keeping your hands at the same level and hip distance apart. There should be slight tension in the exercise band or tube. Without moving your forearm or elbow, slowly bend your wrist up toward the ceiling (wrist flexion). Hold this position for 10 seconds. Slowly return to the starting position. Repeat 10 times. Complete this exercise 1-2 times a day. Wrist extension Sit with your left / right forearm supported  on a table. Your elbow should be at waist height. Rest your hand over the edge of the table, palm down. Gently grasp a 5 lb / kg weight. Or, hold an exercise band or tube in both hands, keeping your hands at the same level and hip distance apart. There should be slight tension in the exercise band or tube. Without moving your forearm or elbow, slowly curl your hand up toward the ceiling (extension). Hold this position for 10 seconds. Slowly return to the starting position. Repeat 10 times. Complete this exercise 1-2 times a day. Forearm rotation, supination  Sit with your left / right forearm supported on a table. Your elbow should be at waist height. Rest your hand over the edge of the table, palm down. Gently grasp a lightweight hammer near the head. As this exercise gets easier for you, try holding the hammer farther down the handle. Without moving your elbow, slowly turn (rotate) your palm up toward the ceiling (supination). Hold this position for 10 seconds. Slowly return to the starting position. Repeat 10 times. Complete this exercise 1-2 times a day. Forearm rotation, pronation  Sit with your left / right forearm supported on a table. Your elbow should be at waist height. Rest your hand over the edge of the table, palm up. Gently grasp a lightweight hammer near the head. As this exercise gets easier for you, try holding the hammer farther down the handle. Without moving your elbow, slowly turn (rotate) your palm down toward the floor (pronation). Hold this position for 10 seconds. Slowly return to the starting position. Repeat 10 times. Complete this exercise 1-2 times a day. Grip strengthening  Hold one of these items in your left / right hand: a dense sponge, a stress ball, or a large, rolled sock. Slowly squeeze the object as hard as you can without increasing any pain. Hold your squeeze for 10 seconds. Slowly release your grip. Repeat 10 times. Complete this exercise 1-2  times a day. This information is not intended to replace advice given to you by your health care provider. Make sure you discuss any questions you have with your healthcare provider. Document Revised: 07/02/2019 Document Reviewed: 07/02/2019 [employer] Patient Education    2022 [employer]  

## 2023-11-28 ENCOUNTER — Other Ambulatory Visit: Payer: Self-pay | Admitting: Orthopedic Surgery

## 2023-12-11 ENCOUNTER — Ambulatory Visit: Admitting: Orthopedic Surgery

## 2023-12-11 ENCOUNTER — Encounter: Payer: Self-pay | Admitting: Orthopedic Surgery

## 2023-12-11 DIAGNOSIS — M79631 Pain in right forearm: Secondary | ICD-10-CM | POA: Diagnosis not present

## 2023-12-11 DIAGNOSIS — M25531 Pain in right wrist: Secondary | ICD-10-CM

## 2023-12-11 MED ORDER — PREDNISONE 10 MG (21) PO TBPK: ORAL_TABLET | ORAL | 0 refills | Status: DC

## 2023-12-11 NOTE — Patient Instructions (Signed)
 Wrist Fracture Rehab Ask your health care provider which exercises are safe for you. Do exercises exactly as told by your health care provider and adjust them as directed. It is normal to feel mild stretching, pulling, tightness, or discomfort as you do these exercises. Stop right away if you feel sudden pain or your pain gets worse. Do not begin these exercises until told by your health care provider. Stretching and range-of-motion exercises These exercises warm up your muscles and joints and improve the movement and flexibility of your wrist and hand. These exercises also help to relieve pain,numbness, and tingling. Finger flexion and extension Sit or stand with your elbow at your side. Open and stretch your left / right fingers as wide as you can (extension). Hold this position for 10 seconds. Close your left / right fingers into a gentle fist (flexion). Hold this position for 10 seconds. Slowly return to the starting position. Repeat 10 times. Complete this exercise 1-2 times a day. Wrist flexion Bend your left / right elbow to a 90-degree angle (right angle) with your palm facing the floor. Bend your wrist forward so your fingers point toward the floor (flexion). Hold this position for 10 seconds. Slowly return to the starting position. Repeat 10 times. Complete this exercise 1-2 times a day. Wrist extension Bend your left / right elbow to a 90-degree angle (right angle) with your palm facing the floor. Bend your wrist backward so your fingers point toward the ceiling (extension). Hold this position for 10 seconds. Slowly return to the starting position. Repeat 10 times. Complete this exercise 1-2 times a day. Ulnar deviation Bend your left / right elbow to a 90-degree angle (right angle), and rest your forearm on a table with your palm facing down. Keeping your hand flat on the table, bend your left / right wrist toward your small finger (pinkie). This is ulnar deviation. Hold this  position for 10 seconds. Slowly return to the starting position. Repeat 10 times. Complete this exercise 1-2 times a day. Radial deviation Bend your left / right elbow to a 90-degree angle (right angle), and rest your forearm on a table with your palm facing down. Keeping your hand flat on the table, bend your left / right wrist toward your thumb. This is radial deviation. Hold this position for 10 seconds. Slowly return to the starting position. Repeat 10 times. Complete this exercise 1-2 times a day. Forearm rotation, supination Stand or sit with your left / right elbow bent to a 90-degree angle (right angle) at your side. Position your forearm so that the thumb is facing the ceiling (neutral position). Turn (rotate) your palm up toward the ceiling (supination), stopping when you feel a gentle stretch. Hold this position for 10 seconds. Slowly return to the starting position. Repeat 10 times. Complete this exercise 1-2 times a day. Forearm rotation, pronation Stand or sit with your left / right elbow bent to a 90-degree angle (right angle) at your side. Position your forearm so that the thumb is facing the ceiling (neutral position). Turn (rotate) your palm down toward the floor (pronation), stopping when you feel a gentle stretch. Hold this position for 10 seconds. Slowly return to the starting position. Repeat 10 times. Complete this exercise 1-2 times a day. Wrist flexion stretch  Extend your left / right arm in front of you and turn your palm down toward the floor. If told by your health care provider, bend your left / right arm to a 90-degree angle (  right angle) at your side. Using your uninjured hand, gently press over the back of your left / right hand to bend your wrist and fingers toward the floor (flexion). Go as far as you can to feel a stretch without causing pain. Hold this position for 10 seconds. Slowly return to the starting position. Repeat 10 times. Complete this  exercise 1-2 times a day. Wrist extension stretch  Extend your left / right arm in front of you and turn your palm up toward the ceiling. If told by your health care provider, bend your left / right arm to a 90-degree angle (right angle) at your side. Using your uninjured hand, gently press over the palm of your left / right hand to bend your wrist and fingers toward the floor (extension). Go as far as you can to feel a stretch without causing pain. Hold this position for 10 seconds. Slowly return to the starting position. Repeat 10 times. Complete this exercise 1-2 times a day. Forearm rotation stretch, supination Stand or sit with your arms at your sides. Bend your left / right elbow to a 90-degree angle (right angle). Using your uninjured hand, turn your left / right palm up toward the ceiling (assisted supination) until you feel a gentle stretch in the inside of your forearm. Hold this position for 10 seconds. Slowly return to the starting position. Repeat 10 times. Complete this exercise 1-2 times a day. Forearm rotation stretch, pronation Stand or sit with your arms at your sides. Bend your left / right elbow to a 90-degree angle (right angle). Using your uninjured hand, turn your left / right palm down toward the floor (assisted pronation) until you feel a gentle stretch in the top of your forearm. Hold this position for 10 seconds. Slowly return to the starting position. Repeat 10 times. Complete this exercise 1-2 times a day. Strengthening exercises These exercises build strength and endurance in your wrist and hand. Enduranceis the ability to use your muscles for a long time, even after they get tired. Wrist flexion Sit with your left / right forearm supported on a table. Your elbow should be at waist height. Rest your hand over the edge of the table, palm up. Gently grasp a 5 lb / kg weight (can of soup). Or, hold an exercise band or tube in both hands, keeping your hands at  the same level and hip distance apart. There should be slight tension in the exercise band or tube. Without moving your forearm or elbow, slowly bend your wrist up toward the ceiling (wrist flexion). Hold this position for 10 seconds. Slowly return to the starting position. Repeat 10 times. Complete this exercise 1-2 times a day. Wrist extension Sit with your left / right forearm supported on a table. Your elbow should be at waist height. Rest your hand over the edge of the table, palm down. Gently grasp a 5 lb / kg weight. Or, hold an exercise band or tube in both hands, keeping your hands at the same level and hip distance apart. There should be slight tension in the exercise band or tube. Without moving your forearm or elbow, slowly curl your hand up toward the ceiling (extension). Hold this position for 10 seconds. Slowly return to the starting position. Repeat 10 times. Complete this exercise 1-2 times a day. Forearm rotation, supination  Sit with your left / right forearm supported on a table. Your elbow should be at waist height. Rest your hand over the edge of the  table, palm down. Gently grasp a lightweight hammer near the head. As this exercise gets easier for you, try holding the hammer farther down the handle. Without moving your elbow, slowly turn (rotate) your palm up toward the ceiling (supination). Hold this position for 10 seconds. Slowly return to the starting position. Repeat 10 times. Complete this exercise 1-2 times a day. Forearm rotation, pronation  Sit with your left / right forearm supported on a table. Your elbow should be at waist height. Rest your hand over the edge of the table, palm up. Gently grasp a lightweight hammer near the head. As this exercise gets easier for you, try holding the hammer farther down the handle. Without moving your elbow, slowly turn (rotate) your palm down toward the floor (pronation). Hold this position for 10 seconds. Slowly return  to the starting position. Repeat 10 times. Complete this exercise 1-2 times a day. Grip strengthening  Hold one of these items in your left / right hand: a dense sponge, a stress ball, or a large, rolled sock. Slowly squeeze the object as hard as you can without increasing any pain. Hold your squeeze for 10 seconds. Slowly release your grip. Repeat 10 times. Complete this exercise 1-2 times a day. This information is not intended to replace advice given to you by your health care provider. Make sure you discuss any questions you have with your healthcare provider. Document Revised: 07/02/2019 Document Reviewed: 07/02/2019 Elsevier Patient Education  2022 [employer]   Tennis Elbow Rehab Do exercises exactly as told by your health care provider and adjust them as directed. It is normal to feel mild stretching, pulling, tightness, or discomfort as you do these exercises. Stop right away if you feel sudden pain or your pain gets worse.   Stretching and range-of-motion exercises These exercises warm up your muscles and joints and improve the movement andflexibility of your elbow. Wrist flexion, assisted  Straighten your left / right elbow in front of you with your palm facing down toward the floor. If told by your health care provider, bend your left / right elbow to a 90-degree angle (right angle) at your side instead of holding it straight. With your other hand, gently push over the back of your left / right hand so your fingers point toward the floor (flexion). Stop when you feel a gentle stretch on the back of your forearm. Hold this position for 10 seconds. Repeat 10 times. Complete this exercise 1-2 times a day. Wrist extension, assisted  Straighten your left / right elbow in front of you with your palm facing up toward the ceiling. If told by your health care provider, bend your left / right elbow to a 90-degree angle (right angle) at your side instead of holding it straight. With  your other hand, gently pull your left / right hand and fingers toward the floor (extension). Stop when you feel a gentle stretch on the palm side of your forearm. Hold this position for 10 seconds. Repeat 10 times. Complete this exercise 1-2 times a day. Assisted forearm rotation, supination Sit or stand with your elbows at your side. Bend your left / right elbow to a 90-degree angle (right angle). Using your uninjured hand, turn your left / right palm up toward the ceiling (supination) until you feel a gentle stretch along the inside of your forearm. Hold this position for 10 seconds. Repeat 10 times. Complete this exercise 1-2 times a day. Assisted forearm rotation, pronation Sit or stand with your  elbows at your side. Bend your left / right elbow to a 90-degree angle (right angle). Using your uninjured hand, turn your left / right palm down toward the floor (pronation) until you feel a gentle stretch along the outside of your forearm. Hold this position for 10 seconds. Repeat 10 times. Complete this exercise 1-2 times a day. Strengthening exercises These exercises build strength and endurance in your forearm and elbow. Endurance is the ability to use your muscles for a long time, even after theyget tired. Radial deviation  Stand with a 5 lbs weight or a hammer in your left / right hand. Or, sit while holding a rubber exercise band or tubing, with your left / right forearm supported on a table or countertop. Position your forearm so that the thumb is facing the ceiling, as if you are going to clap your hands. This is the neutral position. Raise your hand upward in front of you so your thumb moves toward the ceiling (radial deviation), or pull up on the rubber tubing. Keep your forearm and elbow still while you move your wrist only. Hold this position for 10 seconds. Slowly return to the starting position. Repeat 10 times. Complete this exercise 1-2 times a day. Wrist extension,  eccentric Sit with your left / right forearm palm-down and supported on a table or other surface. Let your left / right wrist extend over the edge of the surface. Hold a 5 lbs (can of soup) weight or a piece of exercise band or tubing in your left / right hand. If using a rubber exercise band or tubing, hold the other end of the tubing with your other hand. Use your uninjured hand to move your left / right hand up toward the ceiling. Take your uninjured hand away and slowly return to the starting position using only your left / right hand. Lowering your arm under tension is called eccentric extension. Repeat 10 times. Complete this exercise 1-2 times a day. Wrist extension  Do not do this exercise if it causes pain at the outside of your elbow. Only do this exercise once instructed by your health care provider. Sit with your left / right forearm supported on a table or other surface and your palm turned down toward the floor. Let your left / right wrist extend over the edge of the surface. Hold a 5 lbs weight or a piece of rubber exercise band or tubing. If you are using a rubber exercise band or tubing, hold the band or tubing in place with your other hand to provide resistance. Slowly bend your wrist so your hand moves up toward the ceiling (extension). Move only your wrist, keeping your forearm and elbow still. Hold this position for 10 seconds. Slowly return to the starting position. Repeat 10 times. Complete this exercise 1-2 times a day. Forearm rotation, supination To do this exercise, you will need a lightweight hammer or rubber mallet. Sit with your left / right forearm supported on a table or other surface. Bend your elbow to a 90-degree angle (right angle). Position your forearm so that your palm is facing down toward the floor, with your hand resting over the edge of the table. Hold a hammer in your left / right hand. To make this exercise easier, hold the hammer near the head of the  hammer. To make this exercise harder, hold the hammer near the end of the handle. Without moving your wrist or elbow, slowly rotate your forearm so your palm faces up  toward the ceiling (supination). Hold this position for 10 seconds. Slowly return to the starting position. Repeat 10 times. Complete this exercise 1-2 times a day. Shoulder blade squeeze Sit in a stable chair or stand with good posture. If you are sitting down, do not let your back touch the back of the chair. Your arms should be at your sides with your elbows bent to a 90-degree angle (right angle). Position your forearms so that your thumbs are facing the ceiling (neutral position). Without lifting your shoulders up, squeeze your shoulder blades tightly together. Hold this position for 10 seconds. Slowly release and return to the starting position. Repeat 10 times. Complete this exercise 1-2 times a day. This information is not intended to replace advice given to you by your health care provider. Make sure you discuss any questions you have with your healthcare provider. Document Revised: 05/13/2019 Document Reviewed: 05/13/2019 Elsevier Patient Education  2022 [employer].

## 2023-12-11 NOTE — Progress Notes (Signed)
 Return patient Visit  Assessment: Stacy Wolf is a 48 y.o. female with the following: 1. Pain in right wrist  Plan: Stacy Wolf continues to have pain in the right forearm and wrist area.  This has been ongoing for couple weeks, after lifting a heavy object at work.  On exam, there is no bruising.  There is no redness.  She is able to flex and extend her wrist, as well as flex and extend all fingers.  Pain appears to be diffuse within the right forearm.  She has good grip strength, but struggles to maintain the strength.  I provided reassurance.  Low concern for a tendon rupture, given the mechanism, as well as the current presentation.  This may be some muscle irritation within the forearm, which I anticipate will continue to improve.  She can wear the brace as needed.  She is interested in steroids, which were sent to the pharmacy on file.  Follow-up: Return if symptoms worsen or fail to improve.  Subjective:  Chief Complaint  Patient presents with   Wrist Pain    Right getting worse     History of Present Illness: Stacy Wolf is a 48 y.o. female who returns for evaluation of right wrist pain.  A couple of weeks ago, she lifted up a box at work.  She felt the popping sensation in her right forearm.  I saw her later that same day.  We provided a brace, as well as reassurance.  She has been taking ibuprofen , 800 mg 3 times per day.  This is not improving her symptoms.  She is using a brace at work, which does provide some stability.  She states the pain is getting worse.  She notes a little bit of swelling.  No bruising or redness that she can appreciate.  Review of Systems: No fevers or chills No numbness or tingling No chest pain No shortness of breath No bowel or bladder dysfunction No GI distress No headaches   Objective: LMP 08/10/2016 Comment: [facility]  Physical Exam:  General: Alert and oriented. and No acute distress. Gait: Normal gait.  Right wrist with mild  dorsal forearm swelling.  No redness.  There is no bruising.  She is able to extend her wrist, as well as all fingers.  She has some pain in the volar forearm with flexion of the wrist.  She has good grip strength, but struggles to maintain the strength due to discomfort.  Tenderness is diffuse within the forearm.  Fingers are warm and well-perfused.  Sensation is intact throughout the right hand.  IMAGING: I personally ordered and reviewed the following images   New Medications:  Meds ordered this encounter  Medications   predniSONE  (STERAPRED UNI-PAK 21 TAB) 10 MG (21) TBPK tablet    Sig: 10 mg DS 12 as directed    Dispense:  48 tablet    Refill:  0      Stacy DELENA Horde, MD  12/11/2023 9:40 AM

## 2023-12-20 ENCOUNTER — Encounter: Payer: Self-pay | Admitting: Family Medicine

## 2023-12-20 ENCOUNTER — Other Ambulatory Visit (HOSPITAL_COMMUNITY)
Admission: RE | Admit: 2023-12-20 | Discharge: 2023-12-20 | Disposition: A | Discharge status: Home / Self Care | Source: Ambulatory Visit | Attending: Family Medicine | Admitting: Family Medicine

## 2023-12-20 ENCOUNTER — Ambulatory Visit (INDEPENDENT_AMBULATORY_CARE_PROVIDER_SITE_OTHER): Admitting: Family Medicine

## 2023-12-20 VITALS — BP 130/73 | HR 97 | Ht 63.0 in | Wt 176.0 lb

## 2023-12-20 DIAGNOSIS — N898 Other specified noninflammatory disorders of vagina: Secondary | ICD-10-CM | POA: Diagnosis present

## 2023-12-20 NOTE — Progress Notes (Signed)
 BP 130/73   Pulse 97   Ht 5' 3 (1.6 m)   Wt 176 lb (79.8 kg)   LMP 08/10/2016 Comment: [facility]  SpO2 99%   BMI 31.18 kg/m    Subjective:   Patient ID: Stacy Wolf, female    DOB: Apr 29, 1975, 48 y.o.   MRN: [id number]  HPI: Stacy Wolf is a 48 y.o. female presenting on 12/20/2023 for Vaginal Discharge (Thick,white,odor)   Discussed the use of AI scribe software for clinical note transcription with the patient, who gave verbal consent to proceed.  History of Present Illness   Stacy Wolf is a 48 year old female who presents with vaginal discharge and concerns about recurrent infections.  Vaginal discharge and odor - Vaginal discharge present for the past week - Discharge has a noticeable odor - Onset of symptoms followed initiation of prednisone  for arm injury - Similar episodes have occurred monthly since starting a cholesterol medication (patient refers to as 'rotavirus') - No vaginal bleeding - History of hysterectomy - Concern for possible sexually transmitted infection due to partner's comments about infidelity  Constipation - Constipation ongoing for two weeks - Lactose intolerant but consuming milk daily in an attempt to relieve constipation - Using Miralax twice daily without relief - Increasing fiber intake and drinking plenty of water       Relevant past medical, surgical, family and social history reviewed and updated as indicated. Interim medical history since our last visit reviewed. Allergies and medications reviewed and updated.  Review of Systems  Constitutional:  Negative for chills and fever.  Eyes:  Negative for redness and visual disturbance.  Respiratory:  Negative for chest tightness and shortness of breath.   Cardiovascular:  Negative for chest pain and leg swelling.  Genitourinary:  Positive for vaginal discharge. Negative for difficulty urinating, dysuria, vaginal bleeding and vaginal pain.  Musculoskeletal:  Negative for back pain and  gait problem.  Skin:  Negative for rash.  Neurological:  Negative for light-headedness and headaches.  Psychiatric/Behavioral:  Negative for agitation and behavioral problems.   All other systems reviewed and are negative.   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 If you have any questions, ask your nurse or doctor.          STOP taking these medications    fluconazole  150 MG tablet Commonly known as: Diflucan  Stopped by: [facility] Anjolie Majer   metroNIDAZOLE  500 MG tablet Commonly known as: FLAGYL  Stopped by: [facility] Haizel Gatchell   sulfamethoxazole -trimethoprim  800-160 MG tablet Commonly known as: Bactrim  DS Stopped by: [facility] Sujey Gundry       TAKE these medications    amLODipine  5 MG tablet Commonly known as: NORVASC  Take 1 tablet (5 mg total) by mouth daily.   estradiol  2 MG tablet Commonly known as: Estrace  Take 1 tablet (2 mg total)  by mouth daily.   fluticasone  50 MCG/ACT nasal spray Commonly known as: FLONASE  Place 1 spray into both nostrils 2 (two) times daily as needed for allergies or rhinitis.   gabapentin  300 MG capsule Commonly known as: NEURONTIN  Take 1 capsule (300 mg total) by mouth 3 (three) times daily.   ibuprofen  800 MG tablet Commonly known as: ADVIL  TAKE 1 TABLET BY MOUTH EVERY 8 HOURS AS NEEDED   lansoprazole  15 MG disintegrating tablet Commonly known as: PREVACID  SOLUTAB Take by  mouth.   lisinopril  5 MG tablet Commonly known as: ZESTRIL  Take 1 tablet (5 mg total) by mouth daily.   methocarbamol  750 MG tablet Commonly known as: ROBAXIN  Take 1 tablet (750 mg total) by mouth 4 (four) times daily.   predniSONE  10 MG (21) Tbpk tablet Commonly known as: STERAPRED UNI-PAK 21 TAB 10 mg DS 12 as directed   rosuvastatin  5 MG tablet Commonly known as: Crestor  Take 1 tablet (5 mg total) by mouth daily.   tiZANidine  4 MG tablet Commonly known as: Zanaflex  Take 1 tablet (4 mg total) by mouth every 6 (six) hours as needed for muscle spasms.         Objective:   BP 130/73   Pulse 97   Ht 5' 3 (1.6 m)   Wt 176 lb (79.8 kg)   LMP 08/10/2016 Comment: [facility]  SpO2 99%   BMI 31.18 kg/m   Wt Readings from Last 3 Encounters:  12/20/23 176 lb (79.8 kg)  11/27/23 178 lb (80.7 kg)  10/28/23 171 lb 9.6 oz (77.8 kg)    Physical Exam Exam conducted with a chaperone present.  Genitourinary:    Exam position: Lithotomy position.     Uterus: Absent.     Physical Exam   ABDOMEN: No abdominal tenderness. GENITOURINARY: White vaginal discharge present. No external vaginal sores. No vaginal irritation.         Assessment & Plan:   Problem List Items Addressed This Visit   None Visit Diagnoses       Vaginal discharge    -  Primary   Relevant Orders   Cervicovaginal ancillary only          Vaginal discharge Recurrent discharge with odor possibly due to yeast infection, bacterial vaginosis, or STIs. Recent similar symptoms treated. Possible STI concern due to partner's behavior. - Perform swab for yeast, bacterial vaginosis, gonorrhea, and chlamydia. - Advise use of over-the-counter vaginal suppository like Monistat until test results are available. - Inform that results will be available early next week and to contact if symptoms worsen over the weekend.  Constipation in the setting of lactose intolerance Persistent constipation despite Miralax use.  Possible exacerbation due to milk consumption. - Advise discontinuation of milk consumption. - Recommend continued use of Miralax. - Suggest trying Milk of Magnesia if Miralax is ineffective. - Advise use of enema if Milk of Magnesia does not provide relief. - Encourage increased fiber intake through fruits and vegetables and adequate hydration.          Follow up plan: Return if symptoms worsen or fail to improve.  Counseling provided for all of the vaccine components No orders of the defined types were placed in this encounter.   Fonda Levins, MD [facility] 12/20/2023, 9:37 AM

## 2023-12-23 LAB — CERVICOVAGINAL ANCILLARY ONLY
Bacterial Vaginitis (gardnerella): POSITIVE — AB
Candida Glabrata: NEGATIVE
Candida Vaginitis: POSITIVE — AB
Chlamydia: NEGATIVE
Comment: NEGATIVE
Comment: NEGATIVE
Comment: NEGATIVE
Comment: NEGATIVE
Comment: NEGATIVE
Comment: NORMAL
Neisseria Gonorrhea: NEGATIVE
Trichomonas: NEGATIVE

## 2023-12-24 ENCOUNTER — Telehealth: Payer: Self-pay | Admitting: Orthopedic Surgery

## 2023-12-24 NOTE — Telephone Encounter (Signed)
 Dr. Onesimo pt - returned the pt's call, lvm for her to cb.

## 2023-12-25 ENCOUNTER — Ambulatory Visit: Payer: Self-pay | Admitting: Family Medicine

## 2023-12-25 MED ORDER — METRONIDAZOLE 500 MG PO TABS: 500.0000 mg | ORAL_TABLET | Freq: Two times a day (BID) | ORAL | 0 refills | Status: DC

## 2023-12-25 MED ORDER — FLUCONAZOLE 150 MG PO TABS: 150.0000 mg | ORAL_TABLET | ORAL | 0 refills | Status: DC

## 2023-12-25 NOTE — Addendum Note (Signed)
 Addended by: MARYANNE CHEW on: 12/25/2023 11:05 AM   Modules accepted: Orders

## 2024-01-01 ENCOUNTER — Encounter: Payer: Self-pay | Admitting: Orthopedic Surgery

## 2024-01-01 ENCOUNTER — Ambulatory Visit: Admitting: Orthopedic Surgery

## 2024-01-01 ENCOUNTER — Telehealth: Payer: Self-pay

## 2024-01-01 DIAGNOSIS — M25531 Pain in right wrist: Secondary | ICD-10-CM

## 2024-01-01 DIAGNOSIS — M79631 Pain in right forearm: Secondary | ICD-10-CM

## 2024-01-01 NOTE — Patient Instructions (Signed)
 Note for work - recommend light duty only.  No lifting anything greater than 20 pounds.  Continue with these restrictions until she is evaluated by a hand specialist.

## 2024-01-01 NOTE — Progress Notes (Signed)
 Return patient Visit  Assessment: Stacy Wolf is a 48 y.o. female with the following: 1. Pain in right wrist  Plan: Stacy Wolf continues to have pain in the right forearm and wrist area.  I have seen her several times for this, and she is not having any improvements.  She localizes the pain to the ring finger, primarily on the dorsum of the hand, extending proximally.  She does have a history of right ring finger trigger finger release, but no tenderness of the volar.  She is able to fully extend all fingers, as well as her wrist.  No specific injury mechanism.  After discussing her ongoing symptoms, I recommended a referral to a hand specialist.  This was completed in clinic today.  In addition, we will provide her with a note for work, to complete light duty only.  Follow-up: Return for Referral to Dr. Arlinda.  Subjective:  Chief Complaint  Patient presents with   Wrist Pain    Right- pain still in forearm and wrist right ring finger had trigger finger release but when I am at work my finger will lock down and that causes pain finished ABT and prednsisone an got no relief    History of Present Illness: Stacy Wolf is a 48 y.o. female who returns for evaluation of right wrist pain.  Have seen her several times for pain in the right hand, extending proximally.  She has issues with the ring finger.  She states it will lock, but she has a history of ring finger trigger finger release.  Pain is on the dorsum of the finger, and extends proximally.  She has used a brace as needed.  She took prednisone  recently, but this has not provided sustained relief.   Review of Systems: No fevers or chills No numbness or tingling No chest pain No shortness of breath No bowel or bladder dysfunction No GI distress No headaches   Objective: LMP 08/10/2016 Comment: [facility]  Physical Exam:  General: Alert and oriented. and No acute distress. Gait: Normal gait.  Right wrist and hand  without swelling.  No bruising.  No point tenderness.  No tenderness over the A1 pulley volar.  There is a well-healed surgical incision in this area.  She is able to make a fist.  She can fully extend all fingers, as well as her wrist.  She has good grip strength.   IMAGING: I personally ordered and reviewed the following images   New Medications:  No orders of the defined types were placed in this encounter.     Stacy DELENA Horde, MD  01/01/2024 11:26 AM

## 2024-01-01 NOTE — Telephone Encounter (Signed)
 Patient lft vm message to call her. I called and left a message for her to call the office.

## 2024-01-06 ENCOUNTER — Encounter: Payer: Self-pay | Admitting: Radiology

## 2024-01-19 NOTE — Progress Notes (Unsigned)
 Stacy Wolf - 48 y.o. female MRN [id number]  Date of birth: January 27, 1976  Office Visit Note: Visit Date: 01/20/2024 PCP: Dettinger, Fonda LABOR, MD Referred by: Onesimo Oneil LABOR, MD  Subjective: No chief complaint on file.  HPI: Stacy Wolf is a pleasant 48 y.o. female who presents today for specific hand surgical evaluation as a referral from Dr. Onesimo for ongoing pain in the right forearm and wrist area.  She has been seen several times by Dr. Onesimo in the past for this without improvement. She does have a history of prior carpal tunnel and right ring finger trigger finger release, done in 2020.   She states that initially after the carpal tunnel release, symptoms did not improve, she is having some residual numbness and tingling that has recurred now over the past few months.  Does utilize a wrist brace as needed.  Pertinent ROS were reviewed with the patient and found to be negative unless otherwise specified above in HPI.   Visit Reason: right forearm/wrist Duration of symptoms: September Hand dominance: right Occupation: Packaging Diabetic: No Smoking: Yes Heart/Lung History: none Blood Thinners:  none  Prior Testing/EMG: xrays 12/02/23 Injections (Date):  none Treatments: none Prior Surgery: 11/18/18- trigger release, 03/13/18- CTR    Assessment & Plan: Visit Diagnoses:  1. Right forearm pain   2. Pain in right wrist     Plan: Extensive discussion was had with patient today regarding her right upper extremity complaints.  On examination today, there is no residual triggering of the ring finger, her incisions are well-healed at the carpal tunnel and trigger finger region.  She may be developing some recurrent carpal tunnel syndrome at this juncture.  We did discuss the underlying etiology and pathophysiology of this.  For the time being, I would like her to begin some formalized hand therapy for nerve gliding and tendon gliding exercises of the right wrist, hand and  forearm.  She can return to me in approximately 6 to 8 weeks for recheck.  I did discuss with her the possibility of a repeat electrodiagnostic study in the future should symptoms continue to persist or worsen.  She expressed full understanding today.  New wrist brace was given as well, particularly for nocturnal symptoms.  I spent 30 minutes in the care of this patient today including review of previous documentation, imaging obtained, face-to-face time discussing all options regarding treatment and documenting the encounter.    Follow-up: No follow-ups on file.   Meds & Orders: No orders of the defined types were placed in this encounter.   Orders Placed This Encounter  Procedures   Ambulatory referral to Occupational Therapy     Procedures: No procedures performed      Clinical History: No specialty comments available.  She reports that she has been smoking cigars and cigarettes. She has a 14 pack-year smoking history. She has never used smokeless tobacco. No results for input(s): HGBA1C, LABURIC in the last 8760 hours.  Objective:   Vital Signs: LMP 08/10/2016 Comment: [facility]  Physical Exam  Gen: Well-appearing, in no acute distress; non-toxic CV: Regular Rate. Well-perfused. Warm.  Resp: Breathing unlabored on room air; no wheezing. Psych: Fluid speech in conversation; appropriate affect; normal thought process  Ortho Exam Right upper extremity: - Well-healed carpal tunnel incision in the palmar aspect of the hand, well-healed oblique incision at the base of the ring finger palmarly - Able to perform composite fist without restriction, no evidence of triggering - Negative Tinel's over the  carpal tunnel region, mildly positive Phalen's - Sensation remains intact to light touch in median/radial/ulnar distributions, AIN/PIN/interosseous intact - Negative Finkelstein, no DRUJ instability   Imaging: No results found.  Past Medical/Family/Surgical/Social  History: Medications & Allergies reviewed per EMR, new medications updated. Patient Active Problem List   Diagnosis Date Noted   BV (bacterial vaginosis) 08/19/2023   Urinary frequency 08/19/2023   Status post partial hysterectomy 08/19/2020   Current use of estrogen therapy 01/20/2019   Posttraumatic stress disorder 10/17/2016   Personality disorder (HCC) 10/17/2016   Menorrhagia with irregular cycle 08/13/2016   Obesity (BMI 30.0-34.9) 04/23/2016   Essential hypertension, benign 01/16/2016   Migraine headache 04/18/2015   Tobacco abuse 02/02/2015   Gastroesophageal reflux disease without esophagitis 08/24/2014   Hyperlipidemia LDL goal <100 09/14/2013   Bipolar disorder (HCC) 08/25/2013   Past Medical History:  Diagnosis Date   Anxiety    on meds   Bipolar 1 disorder (HCC)    Blood transfusion without reported diagnosis 2000   Depression    on meds   GERD (gastroesophageal reflux disease)    on meds   Glaucoma    LEFT eye- not a surgical candidate at this time (02/06/2021)   Heart murmur    History of kidney stones    Hypertension    on meds   Migraines    Schizophrenia (HCC)    Seasonal allergies    Family History  Problem Relation Age of Onset   Breast cancer Mother    Cancer Mother 70       breast CA at 55 and uterine CA at 46   Hypertension Mother    Kidney disease Mother        kidney transplant   Diabetes Father    Liver disease Father    Breast cancer Sister    Cancer Sister 63       uterine   Cancer Sister 10       uterine   Breast cancer Maternal Aunt    Cancer Maternal Aunt        breast   Colon polyps Neg Hx    Colon cancer Neg Hx    Esophageal cancer Neg Hx    Rectal cancer Neg Hx    Stomach cancer Neg Hx    Past Surgical History:  Procedure Laterality Date   BILATERAL SALPINGECTOMY Bilateral 08/29/2016   Procedure: BILATERAL SALPINGECTOMY;  Surgeon: Jayne Vonn DEL, MD;  Location: [facility];  Service: Gynecology;  Laterality: Bilateral;    CARPAL TUNNEL RELEASE Left 04/11/2016   Procedure: CARPAL TUNNEL RELEASE;  Surgeon: Taft FORBES Minerva, MD;  Location: [facility];  Service: Orthopedics;  Laterality: Left;   CARPAL TUNNEL RELEASE Right 03/13/2018   Procedure: RIGHT CARPAL TUNNEL RELEASE;  Surgeon: Minerva Taft FORBES, MD;  Location: [facility];  Service: Orthopedics;  Laterality: Right;   KNEE ARTHROSCOPY WITH LATERAL MENISECTOMY Right 09/19/2021   Procedure: KNEE ARTHROSCOPY WITH LATERAL MENISCECTOMY;  Surgeon: Minerva Taft FORBES, MD;  Location: [facility];  Service: Orthopedics;  Laterality: Right;   SUPRACERVICAL ABDOMINAL HYSTERECTOMY N/A 08/29/2016   Procedure: HYSTERECTOMY SUPRACERVICAL ABDOMINAL;  Surgeon: Jayne Vonn DEL, MD;  Location: [facility];  Service: Gynecology;  Laterality: N/A;   TRIGGER FINGER RELEASE Right 11/18/2018   Procedure: RELEASE TRIGGER FINGER/A-1 PULLEY right ring;  Surgeon: Minerva Taft FORBES, MD;  Location: [facility];  Service: Orthopedics;  Laterality: Right;   TUBAL LIGATION     WISDOM TOOTH EXTRACTION     WRIST SURGERY Right  2006   tendonitis   Social History   Occupational History   Not on file  Tobacco Use   Smoking status: Every Day    Current packs/day: 2.00    Average packs/day: 2.0 packs/day for 7.0 years (14.0 ttl pk-yrs)    Types: Cigars, Cigarettes   Smokeless tobacco: Never   Tobacco comments:    1 cigar per day  Vaping Use   Vaping status: Never Used  Substance and Sexual Activity   Alcohol use: Not Currently    Alcohol/week: 0.0 - 1.0 standard drinks of alcohol    Comment: occasionally   Drug use: Yes    Frequency: 4.0 times per week    Types: Marijuana    Comment: 09/15/21   Sexual activity: Yes    Birth control/protection: Surgical    Comment: supracervical hyst    Hattie Aguinaldo Estela) Elford Evilsizer, M.D. [facility], Hand Surgery

## 2024-01-20 ENCOUNTER — Ambulatory Visit (INDEPENDENT_AMBULATORY_CARE_PROVIDER_SITE_OTHER): Admitting: Orthopedic Surgery

## 2024-01-20 DIAGNOSIS — M25531 Pain in right wrist: Secondary | ICD-10-CM

## 2024-01-20 DIAGNOSIS — M79631 Pain in right forearm: Secondary | ICD-10-CM

## 2024-02-04 NOTE — Therapy (Signed)
 OUTPATIENT OCCUPATIONAL THERAPY ORTHO EVALUATION  Patient Name: Stacy Wolf MRN: [id number] DOB:08-Jul-1975, 48 y.o., female Today's Date: 02/04/2024  PCP: Dettinger, Fonda LABOR, MD REFERRING PROVIDER: Arlinda Buster, MD  END OF SESSION:   Past Medical History:  Diagnosis Date   Anxiety    on meds   Bipolar 1 disorder (HCC)    Blood transfusion without reported diagnosis 2000   Depression    on meds   GERD (gastroesophageal reflux disease)    on meds   Glaucoma    LEFT eye- not a surgical candidate at this time (02/06/2021)   Heart murmur    History of kidney stones    Hypertension    on meds   Migraines    Schizophrenia (HCC)    Seasonal allergies    Past Surgical History:  Procedure Laterality Date   BILATERAL SALPINGECTOMY Bilateral 08/29/2016   Procedure: BILATERAL SALPINGECTOMY;  Surgeon: Jayne Vonn DEL, MD;  Location: [facility];  Service: Gynecology;  Laterality: Bilateral;   CARPAL TUNNEL RELEASE Left 04/11/2016   Procedure: CARPAL TUNNEL RELEASE;  Surgeon: Taft FORBES Minerva, MD;  Location: [facility];  Service: Orthopedics;  Laterality: Left;   CARPAL TUNNEL RELEASE Right 03/13/2018   Procedure: RIGHT CARPAL TUNNEL RELEASE;  Surgeon: Minerva Taft FORBES, MD;  Location: [facility];  Service: Orthopedics;  Laterality: Right;   KNEE ARTHROSCOPY WITH LATERAL MENISECTOMY Right 09/19/2021   Procedure: KNEE ARTHROSCOPY WITH LATERAL MENISCECTOMY;  Surgeon: Minerva Taft FORBES, MD;  Location: [facility];  Service: Orthopedics;  Laterality: Right;   SUPRACERVICAL ABDOMINAL HYSTERECTOMY N/A 08/29/2016   Procedure: HYSTERECTOMY SUPRACERVICAL ABDOMINAL;  Surgeon: Jayne Vonn DEL, MD;  Location: [facility];  Service: Gynecology;  Laterality: N/A;   TRIGGER FINGER RELEASE Right 11/18/2018   Procedure: RELEASE TRIGGER FINGER/A-1 PULLEY right ring;  Surgeon: Minerva Taft FORBES, MD;  Location: [facility];  Service: Orthopedics;  Laterality: Right;   TUBAL LIGATION     WISDOM TOOTH EXTRACTION      WRIST SURGERY Right 2006   tendonitis   Patient Active Problem List   Diagnosis Date Noted   BV (bacterial vaginosis) 08/19/2023   Urinary frequency 08/19/2023   Status post partial hysterectomy 08/19/2020   Current use of estrogen therapy 01/20/2019   Posttraumatic stress disorder 10/17/2016   Personality disorder (HCC) 10/17/2016   Menorrhagia with irregular cycle 08/13/2016   Obesity (BMI 30.0-34.9) 04/23/2016   Essential hypertension, benign 01/16/2016   Migraine headache 04/18/2015   Tobacco abuse 02/02/2015   Gastroesophageal reflux disease without esophagitis 08/24/2014   Hyperlipidemia LDL goal <100 09/14/2013   Bipolar disorder (HCC) 08/25/2013    ONSET DATE: 01/20/2024 referral date, symptoms since September   REFERRING DIAG: M25.531 (ICD-10-CM) - Pain in right wrist M79.631 (ICD-10-CM) - Right forearm pain  THERAPY DIAG:  No diagnosis found.  Rationale for Evaluation and Treatment: Rehabilitation  SUBJECTIVE:   SUBJECTIVE STATEMENT: I told the doctor the pain started in forearm. Back in September I picked up something and something popped. They have me on light duty for the past month.  Pt accompanied by: self  PERTINENT HISTORY: Pt has had c/o pain in R wrist and forearm, reports pain since September. Has hx of prior carpal tunnel release and R ring trigger finger release which was done in 2020. Reports after CTR symptoms did not improve and has residual recurring numbness and tingling.   Add'l note from Dr. Erwin from appt on 01/20/24: For the time being, I would like her to begin some formalized  hand therapy for nerve gliding and tendon gliding exercises of the right wrist, hand and forearm. She can return to me in approximately 6 to 8 weeks for recheck  PRECAUTIONS: None  RED FLAGS: None   WEIGHT BEARING RESTRICTIONS: No  PAIN:  Are you having pain? Yes: NPRS scale: 0/10 currently but when trying to pick up heavy things Pain location: R forearm Pain  description: sharp Aggravating factors: lifting heavy things Relieving factors: light duty, heating pad  FALLS: Has patient fallen in last 6 months? 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  NEXT MD VISIT: 02/12/24 with PCP  OBJECTIVE:  Note: Objective measures were completed at Evaluation unless otherwise noted.  HAND DOMINANCE: Right  ADLs: WFL  FUNCTIONAL OUTCOME MEASURES: Quick Dash: 25%  UPPER EXTREMITY ROM:   WNL  Active ROM Right eval Left eval  Shoulder flexion    Shoulder abduction    Shoulder adduction    Shoulder extension    Shoulder internal rotation    Shoulder external rotation    Elbow flexion    Elbow extension    Wrist flexion    Wrist extension    Wrist ulnar deviation    Wrist radial deviation    Wrist pronation    Wrist supination    (Blank rows = not tested)  Active ROM Right eval Left eval  Thumb MCP (0-60)    Thumb IP (0-80)    Thumb Radial abd/add (0-55)     Thumb Palmar abd/add (0-45)     Thumb Opposition to Small Finger     Index MCP (0-90)     Index PIP (0-100)     Index DIP (0-70)      Long MCP (0-90)      Long PIP (0-100)      Long DIP (0-70)      Ring MCP (0-90)      Ring PIP (0-100)      Ring DIP (0-70)      Little MCP (0-90)      Little PIP (0-100)      Little DIP (0-70)      (Blank rows = not tested)   UPPER EXTREMITY MMT:   WNL except for right elbow extension  MMT Right eval Left eval  Shoulder flexion    Shoulder abduction    Shoulder adduction    Shoulder extension    Shoulder internal rotation    Shoulder external rotation    Middle trapezius    Lower trapezius    Elbow flexion    Elbow extension 4/5    Wrist flexion    Wrist extension    Wrist ulnar deviation    Wrist radial  deviation    Wrist pronation    Wrist supination    (Blank rows = not tested)  HAND FUNCTION: Grip strength: Right: 70 lbs; Left: 55 lbs average Right: 90, 65, 55 Left: 65, 55, 50 COORDINATION: 9 Hole Peg test: Right: 26.91 sec; Left: 32.44 sec Box and Blocks:  Right 49 blocks, Left 53blocks  SENSATION: No problems  EDEMA: none  COGNITION: Overall cognitive status: Within functional limits for tasks assessed Areas of impairment: n/a  OBSERVATIONS: pain with lifting heavy things, slight weakness in elbow extension, slightly impaired coordination per box and blocks   TREATMENT DATE: 02/05/24  Educated pt in purpose of OT, goals, and POC. Instructed in isometric HEP and prayer stretches to strengthen forearm and wrist for carryover with ability to pick up heavy packages. See Pt instructions for handout.   PATIENT EDUCATION: Education details: Purpose of OT, goals, POC, and isometric HEP Person educated: Patient Education method: Explanation, Demonstration, Verbal cues, and Handouts Education comprehension: verbalized understanding, returned demonstration, verbal cues required, and needs further education  HOME EXERCISE PROGRAM: Access Code: [email or web address] URL: [email or web address] Date: 02/05/2024 Prepared by: Rocky Dutch  Exercises - Seated Isometric Elbow Flexion  - 1 x daily - 5 x weekly - 3 sets - 5 reps - 3-5 hold - Seated Isometric Elbow Extension  - 1 x daily - 5 x weekly - 3 sets - 5 reps - 3-5 hold - Isometric Wrist Extension Pronated  - 1 x daily - 5 x weekly - 3 sets - 10 reps - 3-5 hold - Seated Isometric Wrist Flexion Supinated with Manual Resistance  - 1 x daily - 5 x weekly - 3 sets - 10 reps - 3-5 hold - Wrist Prayer Stretch at Table  - 1 x daily - 7 x weekly - 3 sets - 10 reps - 3-5 hold  GOALS: Goals reviewed with patient?  Yes  SHORT TERM GOALS: Target date: 02/28/24  Pt will be independent with HEPs for improving R wrist and forearm strength as well as coordination Baseline: Initiated strengthening HEP Goal status: INITIAL  2.  Pt will be able to place at least 53 blocks using right hand with completion of Box and Blocks test.  Baseline: 48 Goal status: INITIAL  3.  Pt will verbalize at least 1 joint protection strategy for R forearm/wrist Baseline: New to OP OT Goal status: INITIAL   LONG TERM GOALS: Target date: 03/20/24  Patient will demonstrate at least 16% improvement with quick Dash score (reporting 9% disability or less) indicating improved functional use of affected extremity.  Baseline: 25% impairment Goal status: INITIAL  2.  Pt will report reduced pain in R wrist and forearm when lifting heavy packages  Baseline: Assess pain when completing heavy lifting Goal status: INITIAL  3.  Pt will demonstrate improved RUE elbow extension goal by a score of at least 4+/5 MMT Baseline: 4/5 Goal status: INITIAL   ASSESSMENT:  CLINICAL IMPRESSION: Patient is a 48 y.o. female who was seen today for occupational therapy evaluation for R wrist and forearm pain. Hx includes CTR and R ring trigger finger release surgery, tobacco use, essential HTN, and bipolar disorder. Patient currently presents slightly below baseline level of functioning demonstrating functional deficits and impairments as noted below. Pt would benefit from skilled OT services in the outpatient setting to work on impairments as noted below to help pt return to PLOF as able.     PERFORMANCE DEFICITS: in functional skills including IADLs, strength, pain, Fine motor control, body mechanics, and UE functional use, cognitive skills including sequencing, and psychosocial skills including coping strategies, environmental adaptation, and routines and behaviors.   IMPAIRMENTS: are limiting patient from IADLs, work, leisure, and social  participation.   COMORBIDITIES: may have co-morbidities  that affects occupational performance. Patient will benefit from skilled OT to address above impairments and improve overall function.  MODIFICATION OR ASSISTANCE TO COMPLETE EVALUATION: No modification of tasks or assist necessary to complete an evaluation.  OT OCCUPATIONAL PROFILE AND HISTORY: Problem focused assessment: Including review of records relating to presenting problem.  CLINICAL DECISION MAKING: LOW - limited treatment options,  no task modification necessary  REHAB POTENTIAL: Good  EVALUATION COMPLEXITY: Low      PLAN:  OT FREQUENCY: 1x/week  OT DURATION: 6 weeks plus eval  PLANNED INTERVENTIONS: 97168 OT Re-evaluation, 97535 self care/ADL training, 02889 therapeutic exercise, 97530 therapeutic activity, 97140 manual therapy, 97035 ultrasound, 97010 moist heat, 97010 cryotherapy, 97032 electrical stimulation (manual), passive range of motion, energy conservation, coping strategies training, patient/family education, and DME and/or AE instructions  RECOMMENDED OTHER SERVICES: none  CONSULTED AND AGREED WITH PLAN OF CARE: Patient  PLAN FOR NEXT SESSION: Assess pain when lifting heavy items F/u isometrics Try ultrasound and/or TENS Joint protection strategy education Further stretches for wrist and forearm   Rocky Dutch, OT 02/04/2024, 11:16 AM

## 2024-02-05 ENCOUNTER — Other Ambulatory Visit: Payer: Self-pay

## 2024-02-05 ENCOUNTER — Ambulatory Visit: Attending: Orthopedic Surgery

## 2024-02-05 DIAGNOSIS — M25531 Pain in right wrist: Secondary | ICD-10-CM | POA: Diagnosis present

## 2024-02-05 DIAGNOSIS — M6281 Muscle weakness (generalized): Secondary | ICD-10-CM | POA: Diagnosis present

## 2024-02-05 DIAGNOSIS — R278 Other lack of coordination: Secondary | ICD-10-CM | POA: Insufficient documentation

## 2024-02-05 DIAGNOSIS — M79631 Pain in right forearm: Secondary | ICD-10-CM | POA: Insufficient documentation

## 2024-02-12 ENCOUNTER — Ambulatory Visit

## 2024-02-12 ENCOUNTER — Ambulatory Visit: Payer: Self-pay | Admitting: Family Medicine

## 2024-02-12 VITALS — BP 127/81 | HR 81 | Ht 63.0 in | Wt 182.0 lb

## 2024-02-12 DIAGNOSIS — Z23 Encounter for immunization: Secondary | ICD-10-CM

## 2024-02-12 DIAGNOSIS — E785 Hyperlipidemia, unspecified: Secondary | ICD-10-CM

## 2024-02-12 DIAGNOSIS — Z0000 Encounter for general adult medical examination without abnormal findings: Secondary | ICD-10-CM

## 2024-02-12 DIAGNOSIS — I10 Essential (primary) hypertension: Secondary | ICD-10-CM

## 2024-02-12 LAB — CMP14+EGFR
ALT: 11 IU/L (ref 0–32)
AST: 16 IU/L (ref 0–40)
Albumin: 4.3 g/dL (ref 3.9–4.9)
Alkaline Phosphatase: 83 IU/L (ref 41–116)
BUN/Creatinine Ratio: 14 (ref 9–23)
BUN: 11 mg/dL (ref 6–24)
Bilirubin Total: <0.2 mg/dL (ref 0.0–1.2)
CO2: 24 mmol/L (ref 20–29)
Calcium: 9.7 mg/dL (ref 8.7–10.2)
Chloride: 104 mmol/L (ref 96–106)
Creatinine, Ser: 0.81 mg/dL (ref 0.57–1.00)
Globulin, Total: 2.2 g/dL (ref 1.5–4.5)
Glucose: 101 mg/dL — ABNORMAL HIGH (ref 70–99)
Potassium: 4 mmol/L (ref 3.5–5.2)
Sodium: 141 mmol/L (ref 134–144)
Total Protein: 6.5 g/dL (ref 6.0–8.5)
eGFR: 89 mL/min/1.73 (ref >59)

## 2024-02-12 LAB — CBC WITH DIFFERENTIAL/PLATELET
Basophils Absolute: 0.1 x10E3/uL (ref 0.0–0.2)
Basos: 1 %
EOS (ABSOLUTE): 0.2 x10E3/uL (ref 0.0–0.4)
Eos: 2 %
Hematocrit: 37.9 % (ref 34.0–46.6)
Hemoglobin: 13.3 g/dL (ref 11.1–15.9)
Immature Grans (Abs): 0 x10E3/uL (ref 0.0–0.1)
Immature Granulocytes: 0 %
Lymphocytes Absolute: 3.4 x10E3/uL — ABNORMAL HIGH (ref 0.7–3.1)
Lymphs: 45 %
MCH: 33.5 pg — ABNORMAL HIGH (ref 26.6–33.0)
MCHC: 35.1 g/dL (ref 31.5–35.7)
MCV: 96 fL (ref 79–97)
Monocytes Absolute: 0.5 x10E3/uL (ref 0.1–0.9)
Monocytes: 7 %
Neutrophils Absolute: 3.4 x10E3/uL (ref 1.4–7.0)
Neutrophils: 45 %
Platelets: 271 x10E3/uL (ref 150–450)
RBC: 3.97 x10E6/uL (ref 3.77–5.28)
RDW: 12.9 % (ref 11.7–15.4)
WBC: 7.5 x10E3/uL (ref 3.4–10.8)

## 2024-02-12 LAB — LIPID PANEL
Chol/HDL Ratio: 2.7 ratio (ref 0.0–4.4)
Cholesterol, Total: 145 mg/dL (ref 100–199)
HDL: 53 mg/dL (ref >39)
LDL Chol Calc (NIH): 70 mg/dL (ref 0–99)
Triglycerides: 122 mg/dL (ref 0–149)
VLDL Cholesterol Cal: 22 mg/dL (ref 5–40)

## 2024-02-12 NOTE — Progress Notes (Signed)
 BP 127/81   Pulse 81   Ht 5' 3 (1.6 m)   Wt 182 lb (82.6 kg)   LMP 08/10/2016 Comment: SCH  SpO2 94%   BMI 32.24 kg/m    Subjective:   Patient ID: Stacy Wolf, female    DOB: April 09, 1975, 48 y.o.   MRN: [id number]  HPI: Stacy Wolf is a 48 y.o. female presenting on 02/12/2024 for Medical Management of Chronic Issues (CPE- no pap/breasts exam)   Discussed the use of AI scribe software for clinical note transcription with the patient, who gave verbal consent to proceed.  History of Present Illness   Stacy Wolf is a 48 year old female who presents for a physical exam and checkup.  Upper respiratory symptoms - Dry cough without mucus - Attributes cough to sinus drainage - Uses an all-day allergy medication similar to Claritin - Occasionally uses Flonase , not consistently every night - Flonase  provides relief for headaches  Hypertension management - Blood pressure readings have been good - Takes antihypertensive medication daily after eating - Did not take medication on the morning of the visit but took it the previous day  Musculoskeletal pain and arthralgia - Concern about arthritis - History of back injury in September with significant pain - X-rays performed after back injury - Uses topical cream for pain relief - Carpal tunnel symptoms in fingers  Genitourinary symptoms - Recent yeast infection, treated successfully - No current pain, kidney pain, or swelling          Relevant past medical, surgical, family and social history reviewed and updated as indicated. Interim medical history since our last visit reviewed. Allergies and medications reviewed and updated.  Review of Systems  Constitutional:  Negative for chills and fever.  HENT:  Negative for congestion, ear discharge and ear pain.   Eyes:  Negative for redness and visual disturbance.  Respiratory:  Negative for chest tightness and shortness of breath.   Cardiovascular:  Negative for chest  pain and leg swelling.  Genitourinary:  Negative for difficulty urinating and dysuria.  Musculoskeletal:  Negative for back pain and gait problem.  Skin:  Negative for rash.  Neurological:  Negative for light-headedness and headaches.  Psychiatric/Behavioral:  Negative for agitation and behavioral problems.   All other systems reviewed and are negative.   Per HPI unless specifically indicated above   Allergies as of 02/12/2024       Reactions   Imitrex  [sumatriptan ] Anaphylaxis, Other (See Comments)   Could not swallow after taking med   Latuda [lurasidone Hcl] Other (See Comments)   Suicidal thoughts   Wellbutrin [bupropion] Other (See Comments)   Seizures / lowered seizure threshold   Zoloft [sertraline Hcl] Hives   Xanax [alprazolam] Other (See Comments)   Passed out   Haloperidol And Related Other (See Comments)   Leg cramps   Nicoderm [nicotine] Dermatitis        Medication List        Accurate as of February 12, 2024  8:28 AM. If you have any questions, ask your nurse or doctor.          STOP taking these medications    fluconazole  150 MG tablet Commonly known as: Diflucan  Stopped by: Fonda LABOR Sreenidhi Ganson   metroNIDAZOLE  500 MG tablet Commonly known as: FLAGYL  Stopped by: Fonda LABOR Gaila Engebretsen   predniSONE  10 MG (21) Tbpk tablet Commonly known as: STERAPRED UNI-PAK 21 TAB Stopped by: Fonda LABOR Jaicob Dia       TAKE  these medications    amLODipine  5 MG tablet Commonly known as: NORVASC  Take 1 tablet (5 mg total) by mouth daily.   estradiol  2 MG tablet Commonly known as: Estrace  Take 1 tablet (2 mg total) by mouth daily.   fluticasone  50 MCG/ACT nasal spray Commonly known as: FLONASE  Place 1 spray into both nostrils 2 (two) times daily as needed for allergies or rhinitis.   gabapentin  300 MG capsule Commonly known as: NEURONTIN  Take 1 capsule (300 mg total) by mouth 3 (three) times daily.   ibuprofen  800 MG tablet Commonly known as:  ADVIL  TAKE 1 TABLET BY MOUTH EVERY 8 HOURS AS NEEDED   lansoprazole  15 MG disintegrating tablet Commonly known as: PREVACID  SOLUTAB Take by mouth.   lisinopril  5 MG tablet Commonly known as: ZESTRIL  Take 1 tablet (5 mg total) by mouth daily.   methocarbamol  750 MG tablet Commonly known as: ROBAXIN  Take 1 tablet (750 mg total) by mouth 4 (four) times daily.   rosuvastatin  5 MG tablet Commonly known as: Crestor  Take 1 tablet (5 mg total) by mouth daily.   tiZANidine  4 MG tablet Commonly known as: Zanaflex  Take 1 tablet (4 mg total) by mouth every 6 (six) hours as needed for muscle spasms.         Objective:   BP 127/81   Pulse 81   Ht 5' 3 (1.6 m)   Wt 182 lb (82.6 kg)   LMP 08/10/2016 Comment: SCH  SpO2 94%   BMI 32.24 kg/m   Wt Readings from Last 3 Encounters:  02/12/24 182 lb (82.6 kg)  12/20/23 176 lb (79.8 kg)  11/27/23 178 lb (80.7 kg)    Physical Exam Physical Exam   HEENT: Ears clear bilaterally. Throat without redness. NECK: Thyroid  without nodules or enlargement. CHEST: Lungs clear to auscultation bilaterally. CARDIOVASCULAR: Heart regular rate and rhythm, no murmurs. ABDOMEN: Abdomen non-tender. EXTREMITIES: No edema, pulses intact.         Assessment & Plan:   Problem List Items Addressed This Visit       Cardiovascular and Mediastinum   Essential hypertension, benign   Relevant Orders   CBC with Differential/Platelet   CMP14+EGFR   Lipid panel     Other   Hyperlipidemia LDL goal <100   Relevant Orders   CBC with Differential/Platelet   CMP14+EGFR   Lipid panel   Other Visit Diagnoses       Physical exam    -  Primary   Relevant Orders   CBC with Differential/Platelet   CMP14+EGFR   Lipid panel           Essential hypertension Blood pressure controlled with current medication. Considering medication discontinuation. - Trial discontinuation of antihypertensive medication for one week. Monitor blood pressure daily. -  Report blood pressure readings to provider.  Allergic rhinitis Chronic dry cough likely from sinus drainage. Using OTC allergy medication and Flonase  intermittently. - Use Flonase  nightly until symptoms resolve.  General Health Maintenance Routine health maintenance visit. Blood work for diabetes screening and vaccinations planned. - Ordered blood work for diabetes screening. - Administered two vaccines.          Follow up plan: Return in about 6 months (around 08/12/2024), or if symptoms worsen or fail to improve, for Hypertension and hyperlipidemia recheck.  Counseling provided for all of the vaccine components Orders Placed This Encounter  Procedures   CBC with Differential/Platelet   CMP14+EGFR   Lipid panel    Fonda Levins, MD [facility] 02/12/2024, 8:28 AM

## 2024-02-17 ENCOUNTER — Ambulatory Visit: Payer: Self-pay | Admitting: Family Medicine

## 2024-02-17 ENCOUNTER — Other Ambulatory Visit: Payer: Self-pay | Admitting: Family Medicine

## 2024-02-17 DIAGNOSIS — E8940 Asymptomatic postprocedural ovarian failure: Secondary | ICD-10-CM

## 2024-02-20 ENCOUNTER — Ambulatory Visit: Admitting: Occupational Therapy

## 2024-02-20 NOTE — Therapy (Incomplete)
 OUTPATIENT OCCUPATIONAL THERAPY ORTHO EVALUATION  Patient Name: Stacy Wolf MRN: [id number] DOB:1975-04-10, 48 y.o., female Today's Date: 02/20/2024  PCP: Dettinger, Fonda LABOR, MD REFERRING PROVIDER: Arlinda Buster, MD  END OF SESSION:   Past Medical History:  Diagnosis Date   Anxiety    on meds   Bipolar 1 disorder (HCC)    Blood transfusion without reported diagnosis 2000   Depression    on meds   GERD (gastroesophageal reflux disease)    on meds   Glaucoma    LEFT eye- not a surgical candidate at this time (02/06/2021)   Heart murmur    History of kidney stones    Hypertension    on meds   Migraines    Schizophrenia (HCC)    Seasonal allergies    Past Surgical History:  Procedure Laterality Date   BILATERAL SALPINGECTOMY Bilateral 08/29/2016   Procedure: BILATERAL SALPINGECTOMY;  Surgeon: Jayne Vonn DEL, MD;  Location: [facility];  Service: Gynecology;  Laterality: Bilateral;   CARPAL TUNNEL RELEASE Left 04/11/2016   Procedure: CARPAL TUNNEL RELEASE;  Surgeon: Taft FORBES Minerva, MD;  Location: [facility];  Service: Orthopedics;  Laterality: Left;   CARPAL TUNNEL RELEASE Right 03/13/2018   Procedure: RIGHT CARPAL TUNNEL RELEASE;  Surgeon: Minerva Taft FORBES, MD;  Location: [facility];  Service: Orthopedics;  Laterality: Right;   KNEE ARTHROSCOPY WITH LATERAL MENISECTOMY Right 09/19/2021   Procedure: KNEE ARTHROSCOPY WITH LATERAL MENISCECTOMY;  Surgeon: Minerva Taft FORBES, MD;  Location: [facility];  Service: Orthopedics;  Laterality: Right;   SUPRACERVICAL ABDOMINAL HYSTERECTOMY N/A 08/29/2016   Procedure: HYSTERECTOMY SUPRACERVICAL ABDOMINAL;  Surgeon: Jayne Vonn DEL, MD;  Location: [facility];  Service: Gynecology;  Laterality: N/A;   TRIGGER FINGER RELEASE Right 11/18/2018   Procedure: RELEASE TRIGGER FINGER/A-1 PULLEY right ring;  Surgeon: Minerva Taft FORBES, MD;  Location: [facility];  Service: Orthopedics;  Laterality: Right;   TUBAL LIGATION     WISDOM TOOTH EXTRACTION      WRIST SURGERY Right 2006   tendonitis   Patient Active Problem List   Diagnosis Date Noted   Status post partial hysterectomy 08/19/2020   Current use of estrogen therapy 01/20/2019   Posttraumatic stress disorder 10/17/2016   Personality disorder (HCC) 10/17/2016   Obesity (BMI 30.0-34.9) 04/23/2016   Essential hypertension, benign 01/16/2016   Migraine headache 04/18/2015   Tobacco abuse 02/02/2015   Gastroesophageal reflux disease without esophagitis 08/24/2014   Hyperlipidemia LDL goal <100 09/14/2013   Bipolar disorder (HCC) 08/25/2013    ONSET DATE: 01/20/2024 referral date, symptoms since September   REFERRING DIAG: M25.531 (ICD-10-CM) - Pain in right wrist M79.631 (ICD-10-CM) - Right forearm pain  THERAPY DIAG:  No diagnosis found.  Rationale for Evaluation and Treatment: Rehabilitation  SUBJECTIVE:   SUBJECTIVE STATEMENT: I told the doctor the pain started in forearm. Back in September I picked up something and something popped. They have me on light duty for the past month.  Pt accompanied by: self  PERTINENT HISTORY: Pt has had c/o pain in R wrist and forearm, reports pain since September. Has hx of prior carpal tunnel release and R ring trigger finger release which was done in 2020. Reports after CTR symptoms did not improve and has residual recurring numbness and tingling.   Add'l note from Dr. Erwin from appt on 01/20/24: For the time being, I would like her to begin some formalized hand therapy for nerve gliding and tendon gliding exercises of the right wrist, hand and forearm. She can  return to me in approximately 6 to 8 weeks for recheck  PRECAUTIONS: None  RED FLAGS: None   WEIGHT BEARING RESTRICTIONS: No  PAIN:  Are you having pain? Yes: NPRS scale: 0/10 currently but when trying to pick up heavy things Pain location: R forearm Pain description: sharp Aggravating factors: lifting heavy things Relieving factors: light duty, heating pad  FALLS: Has  patient fallen in last 6 months? 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  NEXT MD VISIT: 02/12/24 with PCP  OBJECTIVE:  Note: Objective measures were completed at Evaluation unless otherwise noted.  HAND DOMINANCE: Right  ADLs: WFL  FUNCTIONAL OUTCOME MEASURES: Quick Dash: 25%  UPPER EXTREMITY ROM:   WNL  Active ROM Right eval Left eval  Shoulder flexion    Shoulder abduction    Shoulder adduction    Shoulder extension    Shoulder internal rotation    Shoulder external rotation    Elbow flexion    Elbow extension    Wrist flexion    Wrist extension    Wrist ulnar deviation    Wrist radial deviation    Wrist pronation    Wrist supination    (Blank rows = not tested)  Active ROM Right eval Left eval  Thumb MCP (0-60)    Thumb IP (0-80)    Thumb Radial abd/add (0-55)     Thumb Palmar abd/add (0-45)     Thumb Opposition to Small Finger     Index MCP (0-90)     Index PIP (0-100)     Index DIP (0-70)      Long MCP (0-90)      Long PIP (0-100)      Long DIP (0-70)      Ring MCP (0-90)      Ring PIP (0-100)      Ring DIP (0-70)      Little MCP (0-90)      Little PIP (0-100)      Little DIP (0-70)      (Blank rows = not tested)   UPPER EXTREMITY MMT:   WNL except for right elbow extension  MMT Right eval Left eval  Shoulder flexion    Shoulder abduction    Shoulder adduction    Shoulder extension    Shoulder internal rotation    Shoulder external rotation    Middle trapezius    Lower trapezius    Elbow flexion    Elbow extension 4/5    Wrist flexion    Wrist extension    Wrist ulnar deviation    Wrist radial deviation    Wrist pronation    Wrist supination    (Blank rows = not tested)  HAND FUNCTION: Grip strength: Right: 70  lbs; Left: 55 lbs average Right: 90, 65, 55 Left: 65, 55, 50 COORDINATION: 9 Hole Peg test: Right: 26.91 sec; Left: 32.44 sec Box and Blocks:  Right 49 blocks, Left 53blocks  SENSATION: No problems  EDEMA: none  COGNITION: Overall cognitive status: Within functional limits for tasks assessed Areas of impairment: n/a  OBSERVATIONS: pain with lifting heavy things, slight weakness in elbow extension, slightly impaired coordination per box and blocks   TREATMENT DATE:  02/20/24 Review isometric HEP Add tendon and nerve glides Edu on joint protection strategies    02/05/24  Educated pt in purpose of OT, goals, and POC. Instructed in isometric HEP and prayer stretches to strengthen forearm and wrist for carryover with ability to pick up heavy packages. See Pt instructions for handout.   PATIENT EDUCATION: Education details: Purpose of OT, goals, POC, and isometric HEP Person educated: Patient Education method: Explanation, Demonstration, Verbal cues, and Handouts Education comprehension: verbalized understanding, returned demonstration, verbal cues required, and needs further education  HOME EXERCISE PROGRAM: Access Code: [email or web address] URL: [email or web address] Date: 02/05/2024 Prepared by: Rocky Dutch  Exercises - Seated Isometric Elbow Flexion  - 1 x daily - 5 x weekly - 3 sets - 5 reps - 3-5 hold - Seated Isometric Elbow Extension  - 1 x daily - 5 x weekly - 3 sets - 5 reps - 3-5 hold - Isometric Wrist Extension Pronated  - 1 x daily - 5 x weekly - 3 sets - 10 reps - 3-5 hold - Seated Isometric Wrist Flexion Supinated with Manual Resistance  - 1 x daily - 5 x weekly - 3 sets - 10 reps - 3-5 hold - Wrist Prayer Stretch at Table  - 1 x daily - 7 x weekly - 3 sets - 10 reps - 3-5 hold  GOALS: Goals reviewed with patient? Yes  SHORT TERM  GOALS: Target date: 02/28/24  Pt will be independent with HEPs for improving R wrist and forearm strength as well as coordination Baseline: Initiated strengthening HEP Goal status: INITIAL  2.  Pt will be able to place at least 53 blocks using right hand with completion of Box and Blocks test.  Baseline: 48 Goal status: INITIAL  3.  Pt will verbalize at least 1 joint protection strategy for R forearm/wrist Baseline: New to OP OT Goal status: INITIAL   LONG TERM GOALS: Target date: 03/20/24  Patient will demonstrate at least 16% improvement with quick Dash score (reporting 9% disability or less) indicating improved functional use of affected extremity.  Baseline: 25% impairment Goal status: INITIAL  2.  Pt will report reduced pain in R wrist and forearm when lifting heavy packages  Baseline: Assess pain when completing heavy lifting Goal status: INITIAL  3.  Pt will demonstrate improved RUE elbow extension goal by a score of at least 4+/5 MMT Baseline: 4/5 Goal status: INITIAL   ASSESSMENT:  CLINICAL IMPRESSION: Patient is a 48 y.o. female who was seen today for occupational therapy evaluation for R wrist and forearm pain. Hx includes CTR and R ring trigger finger release surgery, tobacco use, essential HTN, and bipolar disorder. Patient currently presents slightly below baseline level of functioning demonstrating functional deficits and impairments as noted below. Pt would benefit from skilled OT services in the outpatient setting to work on impairments as noted below to help pt return to PLOF as able.     PERFORMANCE DEFICITS: in functional skills including IADLs, strength, pain, Fine motor control, body mechanics, and UE functional use, cognitive skills including sequencing, and psychosocial skills including coping strategies, environmental adaptation, and routines and behaviors.   IMPAIRMENTS: are limiting patient from IADLs, work, leisure, and social participation.    COMORBIDITIES: may have co-morbidities  that affects occupational performance. Patient will benefit from skilled OT to address above impairments and improve overall function.  MODIFICATION OR ASSISTANCE TO COMPLETE EVALUATION: No modification of tasks or assist necessary to complete an evaluation.  OT OCCUPATIONAL PROFILE AND HISTORY: Problem focused assessment: Including review of records relating to presenting problem.  CLINICAL DECISION MAKING: LOW - limited treatment options,  no task modification necessary  REHAB POTENTIAL: Good  EVALUATION COMPLEXITY: Low      PLAN:  OT FREQUENCY: 1x/week  OT DURATION: 6 weeks plus eval  PLANNED INTERVENTIONS: 97168 OT Re-evaluation, 97535 self care/ADL training, 02889 therapeutic exercise, 97530 therapeutic activity, 97140 manual therapy, 97035 ultrasound, 97010 moist heat, 97010 cryotherapy, 97032 electrical stimulation (manual), passive range of motion, energy conservation, coping strategies training, patient/family education, and DME and/or AE instructions  RECOMMENDED OTHER SERVICES: none  CONSULTED AND AGREED WITH PLAN OF CARE: Patient  PLAN FOR NEXT SESSION: Assess pain when lifting heavy items F/u isometrics Try ultrasound and/or TENS Joint protection strategy education Further stretches for wrist and forearm   Bryce Kimble, OTR/L 02/20/2024, 7:56 AM  [facility] at [facility] [address] [place], [place] [address] Phone # [phone] Fax # [phone]

## 2024-02-24 ENCOUNTER — Telehealth: Payer: Self-pay | Admitting: Orthopedic Surgery

## 2024-02-24 ENCOUNTER — Ambulatory Visit: Admitting: Occupational Therapy

## 2024-02-24 DIAGNOSIS — M25531 Pain in right wrist: Secondary | ICD-10-CM | POA: Diagnosis not present

## 2024-02-24 DIAGNOSIS — M79631 Pain in right forearm: Secondary | ICD-10-CM

## 2024-02-24 NOTE — Telephone Encounter (Signed)
 Pt called saying that she wants to be released back to full duty on her job. Call back number is [phone]

## 2024-02-24 NOTE — Therapy (Signed)
 " OUTPATIENT OCCUPATIONAL THERAPY ORTHO EVALUATION  Patient Name: Stacy Wolf MRN: [id number] DOB:Jul 18, 1975, 48 y.o., female Today's Date: 02/24/2024  PCP: Dettinger, Fonda LABOR, MD REFERRING PROVIDER: Arlinda Buster, MD  END OF SESSION:  OT End of Session - 02/24/24 0911     Visit Number 2    Number of Visits 7   including eval   Date for Recertification  03/20/24    Authorization Type [employer] required    OT Start Time [phone]    OT Stop Time 0909    OT Time Calculation (min) 23 min    Activity Tolerance Patient tolerated treatment well    Behavior During Therapy [facility] for tasks assessed/performed          Past Medical History:  Diagnosis Date   Anxiety    on meds   Bipolar 1 disorder (HCC)    Blood transfusion without reported diagnosis 2000   Depression    on meds   GERD (gastroesophageal reflux disease)    on meds   Glaucoma    LEFT eye- not a surgical candidate at this time (02/06/2021)   Heart murmur    History of kidney stones    Hypertension    on meds   Migraines    Schizophrenia (HCC)    Seasonal allergies    Past Surgical History:  Procedure Laterality Date   BILATERAL SALPINGECTOMY Bilateral 08/29/2016   Procedure: BILATERAL SALPINGECTOMY;  Surgeon: Jayne Vonn DEL, MD;  Location: [facility];  Service: Gynecology;  Laterality: Bilateral;   CARPAL TUNNEL RELEASE Left 04/11/2016   Procedure: CARPAL TUNNEL RELEASE;  Surgeon: Taft FORBES Minerva, MD;  Location: [facility];  Service: Orthopedics;  Laterality: Left;   CARPAL TUNNEL RELEASE Right 03/13/2018   Procedure: RIGHT CARPAL TUNNEL RELEASE;  Surgeon: Minerva Taft FORBES, MD;  Location: [facility];  Service: Orthopedics;  Laterality: Right;   KNEE ARTHROSCOPY WITH LATERAL MENISECTOMY Right 09/19/2021   Procedure: KNEE ARTHROSCOPY WITH LATERAL MENISCECTOMY;  Surgeon: Minerva Taft FORBES, MD;  Location: [facility];  Service: Orthopedics;  Laterality: Right;   SUPRACERVICAL ABDOMINAL HYSTERECTOMY N/A 08/29/2016    Procedure: HYSTERECTOMY SUPRACERVICAL ABDOMINAL;  Surgeon: Jayne Vonn DEL, MD;  Location: [facility];  Service: Gynecology;  Laterality: N/A;   TRIGGER FINGER RELEASE Right 11/18/2018   Procedure: RELEASE TRIGGER FINGER/A-1 PULLEY right ring;  Surgeon: Minerva Taft FORBES, MD;  Location: [facility];  Service: Orthopedics;  Laterality: Right;   TUBAL LIGATION     WISDOM TOOTH EXTRACTION     WRIST SURGERY Right 2006   tendonitis   Patient Active Problem List   Diagnosis Date Noted   Status post partial hysterectomy 08/19/2020   Current use of estrogen therapy 01/20/2019   Posttraumatic stress disorder 10/17/2016   Personality disorder (HCC) 10/17/2016   Obesity (BMI 30.0-34.9) 04/23/2016   Essential hypertension, benign 01/16/2016   Migraine headache 04/18/2015   Tobacco abuse 02/02/2015   Gastroesophageal reflux disease without esophagitis 08/24/2014   Hyperlipidemia LDL goal <100 09/14/2013   Bipolar disorder (HCC) 08/25/2013    ONSET DATE: 01/20/2024 referral date, symptoms since September   REFERRING DIAG: M25.531 (ICD-10-CM) - Pain in right wrist M79.631 (ICD-10-CM) - Right forearm pain  THERAPY DIAG:  Pain in right wrist  Right forearm pain  Rationale for Evaluation and Treatment: Rehabilitation  SUBJECTIVE:   SUBJECTIVE STATEMENT: Pt reporting htat her arm is doing way better. The exercises have worked and is having no more pain and is not taking any muscle relaxer anymore.  Pt accompanied by: self  PERTINENT HISTORY: Pt has had c/o pain in R wrist and forearm, reports pain since September. Has hx of prior carpal tunnel release and R ring trigger finger release which was done in 2020. Reports after CTR symptoms did not improve and has residual recurring numbness and tingling.   Add'l note from Dr. Erwin from appt on 01/20/24: For the time being, I would like her to begin some formalized hand therapy for nerve gliding and tendon gliding exercises of the right wrist, hand and  forearm. She can return to me in approximately 6 to 8 weeks for recheck  PRECAUTIONS: None  RED FLAGS: None   WEIGHT BEARING RESTRICTIONS: No  PAIN:  Are you having pain? No  FALLS: Has patient fallen in last 6 months? 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  NEXT MD VISIT: 02/12/24 with PCP  OBJECTIVE:  Note: Objective measures were completed at Evaluation unless otherwise noted.  HAND DOMINANCE: Right  ADLs: WFL  FUNCTIONAL OUTCOME MEASURES: Quick Dash: 25%  UPPER EXTREMITY ROM:   WNL  Active ROM Right eval Left eval  Shoulder flexion    Shoulder abduction    Shoulder adduction    Shoulder extension    Shoulder internal rotation    Shoulder external rotation    Elbow flexion    Elbow extension    Wrist flexion    Wrist extension    Wrist ulnar deviation    Wrist radial deviation    Wrist pronation    Wrist supination    (Blank rows = not tested)  Active ROM Right eval Left eval  Thumb MCP (0-60)    Thumb IP (0-80)    Thumb Radial abd/add (0-55)     Thumb Palmar abd/add (0-45)     Thumb Opposition to Small Finger     Index MCP (0-90)     Index PIP (0-100)     Index DIP (0-70)      Long MCP (0-90)      Long PIP (0-100)      Long DIP (0-70)      Ring MCP (0-90)      Ring PIP (0-100)      Ring DIP (0-70)      Little MCP (0-90)      Little PIP (0-100)      Little DIP (0-70)      (Blank rows = not tested)   UPPER EXTREMITY MMT:   WNL except for right elbow extension  MMT Right eval Left eval  Shoulder flexion    Shoulder abduction    Shoulder adduction    Shoulder extension    Shoulder internal rotation    Shoulder external rotation    Middle trapezius    Lower trapezius    Elbow flexion    Elbow extension 4/5    Wrist  flexion    Wrist extension    Wrist ulnar deviation    Wrist radial deviation    Wrist pronation    Wrist supination    (Blank rows = not tested)  HAND FUNCTION: Grip strength: Right: 70 lbs; Left: 55 lbs average Right: 90, 65, 55 Left: 65, 55, 50 COORDINATION: 9 Hole Peg test: Right: 26.91 sec; Left: 32.44 sec Box and Blocks:  Right 49 blocks, Left 53blocks  SENSATION: No problems  EDEMA: none  COGNITION: Overall cognitive status: Within functional limits for tasks assessed Areas of impairment: n/a  OBSERVATIONS: pain with lifting heavy things, slight weakness in elbow extension, slightly impaired coordination per box and blocks   TREATMENT DATE:  02/24/24 Work:  pt reporting that she is back to full duty at work and is having no issues, no pain, and not taking any medication for pain.  Pt asking about being cleared to return to full work load.  OT encouraging pt to f/u with referring MD for official clearance based on progress with therapy and reduced pain. Reviewed isometric HEP: pt reporting understanding of each and demonstrating use of prayer stretch.  Pt reporting utilizing prayer stretch most often and throughout the work day. Joint protection strategies: educated on LESS mnemonic for joint protection strategies - Listen to your body, Energy conservation, Stronger joints take the lead, Strategize. OT providing examples in each area.  Pt reporting understanding of each and plan to share at work to aid her coworkers.    02/05/24                                                                                                                            Educated pt in purpose of OT, goals, and POC. Instructed in isometric HEP and prayer stretches to strengthen forearm and wrist for carryover with ability to pick up heavy packages. See Pt instructions for handout.   PATIENT EDUCATION: Education details: HEP, joint protection strategies Person educated: Patient Education  method: Explanation, Demonstration, Verbal cues, and Handouts Education comprehension: verbalized understanding, returned demonstration, verbal cues required, and needs further education  HOME EXERCISE PROGRAM: Access Code: [email or web address] URL: [email or web address] Date: 02/05/2024 Prepared by: Rocky Dutch  Exercises - Seated Isometric Elbow Flexion  - 1 x daily - 5 x weekly - 3 sets - 5 reps - 3-5 hold - Seated Isometric Elbow Extension  - 1 x daily - 5 x weekly - 3 sets - 5 reps - 3-5 hold - Isometric Wrist Extension Pronated  - 1 x daily - 5 x weekly - 3 sets - 10 reps - 3-5 hold - Seated Isometric Wrist Flexion Supinated with Manual Resistance  - 1 x daily - 5 x weekly - 3 sets - 10 reps - 3-5 hold - Wrist Prayer Stretch at Table  - 1 x daily - 7 x weekly - 3 sets - 10 reps - 3-5 hold  GOALS: Goals reviewed with patient? Yes  SHORT TERM GOALS: Target date: 02/28/24  Pt will be independent with HEPs for improving R wrist and forearm strength as well as coordination Baseline: Initiated strengthening HEP 02/24/24: pt reporting using prayer stretch intermittently with significant decrease in pain Goal status: in progress  2.  Pt will be able to place at least 53 blocks using right hand with completion of Box and Blocks test.  Baseline: 48 Goal status: in progress  3.  Pt will verbalize  at least 1 joint protection strategy for R forearm/wrist Baseline: New to OP OT 02/24/24: initiated this date Goal status: in progress   LONG TERM GOALS: Target date: 03/20/24  Patient will demonstrate at least 16% improvement with quick Dash score (reporting 9% disability or less) indicating improved functional use of affected extremity.  Baseline: 25% impairment Goal status: in progress  2.  Pt will report reduced pain in R wrist and forearm when lifting heavy packages  Baseline: Assess pain when completing heavy lifting Goal status: in progress  3.  Pt will demonstrate improved  RUE elbow extension goal by a score of at least 4+/5 MMT Baseline: 4/5 Goal status: in progress   ASSESSMENT:  CLINICAL IMPRESSION: Patient is a 48 y.o. female who was seen today for occupational therapy treatment for R wrist and forearm pain. Hx includes CTR and R ring trigger finger release surgery, tobacco use, essential HTN, and bipolar disorder. Pt presents to appointment reporting that her pain is completely gone and that she has been able to resume typical work tasks.  Pt reporting understanding of HEP and appreciative of education on joint protection strategies this date.  Pt planning continue with HEP on her own and will follow up with OT as needed in mid-January.  PERFORMANCE DEFICITS: in functional skills including IADLs, strength, pain, Fine motor control, body mechanics, and UE functional use, cognitive skills including sequencing, and psychosocial skills including coping strategies, environmental adaptation, and routines and behaviors.     PLAN:  OT FREQUENCY: 1x/week  OT DURATION: 6 weeks plus eval  PLANNED INTERVENTIONS: 97168 OT Re-evaluation, 97535 self care/ADL training, 02889 therapeutic exercise, 97530 therapeutic activity, 97140 manual therapy, 97035 ultrasound, 97010 moist heat, 97010 cryotherapy, 97032 electrical stimulation (manual), passive range of motion, energy conservation, coping strategies training, patient/family education, and DME and/or AE instructions  RECOMMENDED OTHER SERVICES: none  CONSULTED AND AGREED WITH PLAN OF CARE: Patient  PLAN FOR NEXT SESSION: Assess pain when lifting heavy items F/u isometrics Review Joint protection strategy education Further stretches for wrist and forearm  Review goals and d/c if pt not returning until mid-January with no further concerns   KAYLENE DOMINO, OTR/L 02/24/2024, 9:12 AM  [facility] at [facility] [address] [place], [place] [address] Phone # [phone] Fax # [phone]    "

## 2024-03-02 ENCOUNTER — Telehealth: Payer: Self-pay | Admitting: Orthopedic Surgery

## 2024-03-02 NOTE — Telephone Encounter (Signed)
 Dr. Onesimo pt - spoke w/the pt today, she stated that she is finished w/PT, had been d/c from PT.  She would like a note to be able to work full duty.  Please advise.  [phone]

## 2024-03-03 ENCOUNTER — Telehealth: Payer: Self-pay | Admitting: Orthopedic Surgery

## 2024-03-03 ENCOUNTER — Ambulatory Visit: Admitting: Occupational Therapy

## 2024-03-03 NOTE — Telephone Encounter (Signed)
 Pt called saying that she was told by person who referred her to Dr. Erwin that she needs to see Dr. Erwin before she can return to work with no restrictions. Call back number is [phone]

## 2024-03-03 NOTE — Telephone Encounter (Signed)
 LVM for the pt advising.

## 2024-03-04 ENCOUNTER — Encounter: Payer: Self-pay | Admitting: Family Medicine

## 2024-03-04 ENCOUNTER — Ambulatory Visit: Admitting: Family Medicine

## 2024-03-04 VITALS — BP 139/81 | HR 82 | Ht 63.0 in | Wt 186.0 lb

## 2024-03-04 DIAGNOSIS — J0110 Acute frontal sinusitis, unspecified: Secondary | ICD-10-CM | POA: Diagnosis not present

## 2024-03-04 MED ORDER — AMOXICILLIN-POT CLAVULANATE 875-125 MG PO TABS: 1.0000 | ORAL_TABLET | Freq: Two times a day (BID) | ORAL | 0 refills | Status: DC

## 2024-03-04 MED ORDER — FLUCONAZOLE 150 MG PO TABS: 150.0000 mg | ORAL_TABLET | Freq: Once | ORAL | 0 refills | Status: AC

## 2024-03-04 NOTE — Telephone Encounter (Signed)
"  Called and scheduled   "

## 2024-03-04 NOTE — Progress Notes (Signed)
 "  BP 139/81   Pulse 82   Ht 5' 3 (1.6 m)   Wt 186 lb (84.4 kg)   LMP 08/10/2016 Comment: SCH  SpO2 98%   BMI 32.95 kg/m    Subjective:   Patient ID: Stacy Wolf, female    DOB: [birth year], 47 y.o.   MRN: [id number]  HPI: Stacy Wolf is a 48 y.o. female presenting on 03/04/2024 for Sinus Problem (And HA- Mucinex  and Flonase  not helping)   Discussed the use of AI scribe software for clinical note transcription with the patient, who gave verbal consent to proceed.  History of Present Illness   Stacy Wolf is a 48 year old female who presents with worsening sinus pressure and dry cough.  Sinus pressure - Worsening diffuse sinus pressure for the past couple of weeks - No improvement with Flonase  use - Flonase  seems to inhibit drainage  Cough and respiratory symptoms - Dry cough developed recently - Taking Mucinex  in addition to Flonase  without significant relief - Ear pressure that pops when coughing, more pronounced on one side - Wheezing present when prompted - No fevers, chills, or body aches - COVID test negative  Fatigue - Feels tired after working long hours (7 AM to 7 PM)  Psychosocial stressors - Mother is staying with her, which limits her activities at home and is challenging          Relevant past medical, surgical, family and social history reviewed and updated as indicated. Interim medical history since our last visit reviewed. Allergies and medications reviewed and updated.  Review of Systems  Constitutional:  Negative for chills and fever.  HENT:  Positive for congestion, postnasal drip, rhinorrhea and sinus pressure. Negative for ear discharge, ear pain, sneezing and sore throat.   Eyes:  Negative for pain, redness and visual disturbance.  Respiratory:  Positive for cough and wheezing. Negative for chest tightness and shortness of breath.   Cardiovascular:  Negative for chest pain and leg swelling.  Genitourinary:  Negative for difficulty  urinating and dysuria.  Musculoskeletal:  Negative for back pain and gait problem.  Skin:  Negative for rash.  Neurological:  Negative for light-headedness and headaches.  Psychiatric/Behavioral:  Negative for agitation and behavioral problems.   All other systems reviewed and are negative.   Per HPI unless specifically indicated above   Allergies as of 03/04/2024       Reactions   Imitrex  [sumatriptan ] Anaphylaxis, Other (See Comments)   Could not swallow after taking med   Latuda [lurasidone Hcl] Other (See Comments)   Suicidal thoughts   Wellbutrin [bupropion] Other (See Comments)   Seizures / lowered seizure threshold   Zoloft [sertraline Hcl] Hives   Xanax [alprazolam] Other (See Comments)   Passed out   Haloperidol And Related Other (See Comments)   Leg cramps   Nicoderm [nicotine] Dermatitis        Medication List        Accurate as of March 04, 2024  9:53 AM. If you have any questions, ask your nurse or doctor.          amLODipine  5 MG tablet Commonly known as: NORVASC  Take 1 tablet (5 mg total) by mouth daily.   amoxicillin -clavulanate 875-125 MG tablet Commonly known as: AUGMENTIN  Take 1 tablet by mouth 2 (two) times daily. Started by: Fonda Levins, MD   estradiol  2 MG tablet Commonly known as: ESTRACE  TAKE 1 TABLET BY MOUTH EVERY DAY   fluconazole  150 MG tablet  Commonly known as: DIFLUCAN  Take 1 tablet (150 mg total) by mouth once for 1 dose. Started by: Fonda Levins, MD   fluticasone  50 MCG/ACT nasal spray Commonly known as: FLONASE  Place 1 spray into both nostrils 2 (two) times daily as needed for allergies or rhinitis.   gabapentin  300 MG capsule Commonly known as: NEURONTIN  Take 1 capsule (300 mg total) by mouth 3 (three) times daily.   ibuprofen  800 MG tablet Commonly known as: ADVIL  TAKE 1 TABLET BY MOUTH EVERY 8 HOURS AS NEEDED   lansoprazole  15 MG disintegrating tablet Commonly known as: PREVACID  SOLUTAB Take by  mouth.   lisinopril  5 MG tablet Commonly known as: ZESTRIL  Take 1 tablet (5 mg total) by mouth daily.   methocarbamol  750 MG tablet Commonly known as: ROBAXIN  Take 1 tablet (750 mg total) by mouth 4 (four) times daily.   rosuvastatin  5 MG tablet Commonly known as: Crestor  Take 1 tablet (5 mg total) by mouth daily.   tiZANidine  4 MG tablet Commonly known as: Zanaflex  Take 1 tablet (4 mg total) by mouth every 6 (six) hours as needed for muscle spasms.         Objective:   BP 139/81   Pulse 82   Ht 5' 3 (1.6 m)   Wt 186 lb (84.4 kg)   LMP 08/10/2016 Comment: SCH  SpO2 98%   BMI 32.95 kg/m   Wt Readings from Last 3 Encounters:  03/04/24 186 lb (84.4 kg)  02/12/24 182 lb (82.6 kg)  12/20/23 176 lb (79.8 kg)    Physical Exam HENT:     Right Ear: Tympanic membrane and ear canal normal.     Left Ear: Tympanic membrane and ear canal normal.     Nose:     Right Sinus: Maxillary sinus tenderness and frontal sinus tenderness present.     Left Sinus: Maxillary sinus tenderness and frontal sinus tenderness present.    Physical Exam   HEENT: No pharyngeal erythema. No erythema in ears. NECK: No cervical lymphadenopathy. CHEST: Wheezing in lungs. CARDIOVASCULAR: Heart regular rate and rhythm, no murmurs.         Assessment & Plan:   Problem List Items Addressed This Visit   None Visit Diagnoses       Acute non-recurrent frontal sinusitis    -  Primary   Relevant Medications   amoxicillin -clavulanate (AUGMENTIN ) 875-125 MG tablet   fluconazole  (DIFLUCAN ) 150 MG tablet           Acute sinusitis Worsening sinus pressure and dry cough. Negative COVID test. Initial treatment with Flonase  and Mucinex  was ineffective. - Prescribed amoxicillin . - Continue Flonase  or Mucinex , whichever is preferred. - Consider using a neti pot for nasal lavage.          Follow up plan: Return if symptoms worsen or fail to improve.  Counseling provided for all of the vaccine  components No orders of the defined types were placed in this encounter.   Fonda Levins, MD [facility] 03/04/2024, 9:53 AM     "

## 2024-03-09 ENCOUNTER — Ambulatory Visit (INDEPENDENT_AMBULATORY_CARE_PROVIDER_SITE_OTHER): Admitting: Orthopedic Surgery

## 2024-03-09 DIAGNOSIS — M79631 Pain in right forearm: Secondary | ICD-10-CM

## 2024-03-09 DIAGNOSIS — M25531 Pain in right wrist: Secondary | ICD-10-CM

## 2024-03-09 NOTE — Progress Notes (Signed)
 "  Stacy Wolf - 49 y.o. female MRN [id number]  Date of birth: 1975/04/21  Office Visit Note: Visit Date: 03/09/2024 PCP: Dettinger, Fonda LABOR, MD Referred by: Dettinger, Fonda LABOR, MD  Subjective: No chief complaint on file.  HPI: Stacy Wolf is a pleasant 49 y.o. female who returns today for follow-up of right forearm and wrist pain.  She does have a history of prior carpal tunnel and right ring finger trigger finger release, done in 2020.   She was sent for recent OT with drastic improvement in her symptoms, she is quite pleased with her outcome and progress.  She would like to return to work at this juncture.  Pertinent ROS were reviewed with the patient and found to be negative unless otherwise specified above in HPI.     Assessment & Plan: Visit Diagnoses:  1. Pain in right wrist   2. Right forearm pain      Plan: Extensive discussion was had with patient today regarding her right upper extremity complaints.  On examination today, there is no residual triggering of the ring finger, her incisions are well-healed at the carpal tunnel and trigger finger region.  At her prior visit, she was developing some signs of potential recurrent carpal tunnel syndrome which have responded nicely to occupational therapy nerve glides and tendon glides.  She can continue utilizing this home exercise program as needed moving forward.  I am pleased to see how well she has done with conservative treatment.  She is welcome to return to me as needed in the future.  Work note was provided today to return to full duty without restriction.     Follow-up: No follow-ups on file.   Meds & Orders: No orders of the defined types were placed in this encounter.   No orders of the defined types were placed in this encounter.    Procedures: No procedures performed      Clinical History: No specialty comments available.  She reports that she has been smoking cigars and cigarettes. She has a 14  pack-year smoking history. She has never used smokeless tobacco. No results for input(s): HGBA1C, LABURIC in the last 8760 hours.  Objective:   Vital Signs: LMP 08/10/2016 Comment: [facility]  Physical Exam  Gen: Well-appearing, in no acute distress; non-toxic CV: Regular Rate. Well-perfused. Warm.  Resp: Breathing unlabored on room air; no wheezing. Psych: Fluid speech in conversation; appropriate affect; normal thought process  Ortho Exam Right upper extremity: - Well-healed carpal tunnel incision in the palmar aspect of the hand, well-healed oblique incision at the base of the ring finger palmarly - Able to perform composite fist without restriction, no evidence of triggering - Negative Tinel's over the carpal tunnel region, negative Phalen's - Sensation remains intact to light touch in median/radial/ulnar distributions, AIN/PIN/interosseous intact - Negative Finkelstein, no DRUJ instability   Imaging: No results found.  Past Medical/Family/Surgical/Social History: Medications & Allergies reviewed per EMR, new medications updated. Patient Active Problem List   Diagnosis Date Noted   Status post partial hysterectomy 08/19/2020   Current use of estrogen therapy 01/20/2019   Posttraumatic stress disorder 10/17/2016   Personality disorder (HCC) 10/17/2016   Obesity (BMI 30.0-34.9) 04/23/2016   Essential hypertension, benign 01/16/2016   Migraine headache 04/18/2015   Tobacco abuse 02/02/2015   Gastroesophageal reflux disease without esophagitis 08/24/2014   Hyperlipidemia LDL goal <100 09/14/2013   Bipolar disorder (HCC) 08/25/2013   Past Medical History:  Diagnosis Date   Anxiety  on meds   Bipolar 1 disorder (HCC)    Blood transfusion without reported diagnosis 2000   Depression    on meds   GERD (gastroesophageal reflux disease)    on meds   Glaucoma    LEFT eye- not a surgical candidate at this time (02/06/2021)   Heart murmur    History of kidney stones     Hypertension    on meds   Migraines    Schizophrenia (HCC)    Seasonal allergies    Family History  Problem Relation Age of Onset   Breast cancer Mother    Cancer Mother 12       breast CA at 83 and uterine CA at 71   Hypertension Mother    Kidney disease Mother        kidney transplant   Diabetes Father    Liver disease Father    Breast cancer Sister    Cancer Sister 64       uterine   Cancer Sister 14       uterine   Breast cancer Maternal Aunt    Cancer Maternal Aunt        breast   Colon polyps Neg Hx    Colon cancer Neg Hx    Esophageal cancer Neg Hx    Rectal cancer Neg Hx    Stomach cancer Neg Hx    Past Surgical History:  Procedure Laterality Date   BILATERAL SALPINGECTOMY Bilateral 08/29/2016   Procedure: BILATERAL SALPINGECTOMY;  Surgeon: Jayne Vonn DEL, MD;  Location: [facility];  Service: Gynecology;  Laterality: Bilateral;   CARPAL TUNNEL RELEASE Left 04/11/2016   Procedure: CARPAL TUNNEL RELEASE;  Surgeon: Taft FORBES Minerva, MD;  Location: [facility];  Service: Orthopedics;  Laterality: Left;   CARPAL TUNNEL RELEASE Right 03/13/2018   Procedure: RIGHT CARPAL TUNNEL RELEASE;  Surgeon: Minerva Taft FORBES, MD;  Location: [facility];  Service: Orthopedics;  Laterality: Right;   KNEE ARTHROSCOPY WITH LATERAL MENISECTOMY Right 09/19/2021   Procedure: KNEE ARTHROSCOPY WITH LATERAL MENISCECTOMY;  Surgeon: Minerva Taft FORBES, MD;  Location: [facility];  Service: Orthopedics;  Laterality: Right;   SUPRACERVICAL ABDOMINAL HYSTERECTOMY N/A 08/29/2016   Procedure: HYSTERECTOMY SUPRACERVICAL ABDOMINAL;  Surgeon: Jayne Vonn DEL, MD;  Location: [facility];  Service: Gynecology;  Laterality: N/A;   TRIGGER FINGER RELEASE Right 11/18/2018   Procedure: RELEASE TRIGGER FINGER/A-1 PULLEY right ring;  Surgeon: Minerva Taft FORBES, MD;  Location: [facility];  Service: Orthopedics;  Laterality: Right;   TUBAL LIGATION     WISDOM TOOTH EXTRACTION     WRIST SURGERY Right 2006   tendonitis   Social  History   Occupational History   Not on file  Tobacco Use   Smoking status: Every Day    Current packs/day: 2.00    Average packs/day: 2.0 packs/day for 7.0 years (14.0 ttl pk-yrs)    Types: Cigars, Cigarettes   Smokeless tobacco: Never   Tobacco comments:    1 cigar per day  Vaping Use   Vaping status: Never Used  Substance and Sexual Activity   Alcohol use: Not Currently    Alcohol/week: 0.0 - 1.0 standard drinks of alcohol    Comment: occasionally   Drug use: Yes    Frequency: 4.0 times per week    Types: Marijuana    Comment: 09/15/21   Sexual activity: Yes    Birth control/protection: Surgical    Comment: supracervical hyst    Desmon Hitchner Estela) Nikan Ellingson, M.D. [facility], Hand  Surgery  "

## 2024-03-10 ENCOUNTER — Ambulatory Visit: Admitting: Occupational Therapy

## 2024-03-18 ENCOUNTER — Ambulatory Visit

## 2024-03-20 ENCOUNTER — Emergency Department (HOSPITAL_COMMUNITY)

## 2024-03-20 ENCOUNTER — Other Ambulatory Visit: Payer: Self-pay

## 2024-03-20 ENCOUNTER — Emergency Department (HOSPITAL_COMMUNITY)
Admission: EM | Admit: 2024-03-20 | Discharge: 2024-03-21 | Disposition: A | Discharge status: Home / Self Care | Attending: Emergency Medicine | Admitting: Emergency Medicine

## 2024-03-20 DIAGNOSIS — J0110 Acute frontal sinusitis, unspecified: Secondary | ICD-10-CM

## 2024-03-20 DIAGNOSIS — R079 Chest pain, unspecified: Secondary | ICD-10-CM | POA: Diagnosis present

## 2024-03-20 DIAGNOSIS — I10 Essential (primary) hypertension: Secondary | ICD-10-CM | POA: Diagnosis not present

## 2024-03-20 DIAGNOSIS — Z79899 Other long term (current) drug therapy: Secondary | ICD-10-CM | POA: Insufficient documentation

## 2024-03-20 LAB — CBC
HCT: 38.6 % (ref 36.0–46.0)
Hemoglobin: 12.6 g/dL (ref 12.0–15.0)
MCH: 32 pg (ref 26.0–34.0)
MCHC: 32.6 g/dL (ref 30.0–36.0)
MCV: 98 fL (ref 80.0–100.0)
Platelets: 250 K/uL (ref 150–400)
RBC: 3.94 MIL/uL (ref 3.87–5.11)
RDW: 13.9 % (ref 11.5–15.5)
WBC: 7.4 K/uL (ref 4.0–10.5)
nRBC: 0 % (ref 0.0–0.2)

## 2024-03-20 LAB — BASIC METABOLIC PANEL WITH GFR
Anion gap: 11 (ref 5–15)
BUN: 9 mg/dL (ref 6–20)
CO2: 23 mmol/L (ref 22–32)
Calcium: 8.7 mg/dL — ABNORMAL LOW (ref 8.9–10.3)
Chloride: 108 mmol/L (ref 98–111)
Creatinine, Ser: 0.76 mg/dL (ref 0.44–1.00)
GFR, Estimated: >60 mL/min
Glucose, Bld: 96 mg/dL (ref 70–99)
Potassium: 4 mmol/L (ref 3.5–5.1)
Sodium: 142 mmol/L (ref 135–145)

## 2024-03-20 LAB — TROPONIN T, HIGH SENSITIVITY
Troponin T High Sensitivity: <15 ng/L (ref 0–19)
Troponin T High Sensitivity: <15 ng/L (ref 0–19)

## 2024-03-20 MED ORDER — IPRATROPIUM-ALBUTEROL 0.5-2.5 (3) MG/3ML IN SOLN
3.0000 mL | Freq: Once | RESPIRATORY_TRACT | Status: AC
  Administered 2024-03-20: 3 mL via RESPIRATORY_TRACT
  Filled 2024-03-20: qty 3

## 2024-03-20 MED ORDER — ASPIRIN 81 MG PO CHEW
324.0000 mg | CHEWABLE_TABLET | Freq: Once | ORAL | Status: AC
  Administered 2024-03-20: 324 mg via ORAL
  Filled 2024-03-20: qty 4

## 2024-03-20 NOTE — ED Provider Notes (Signed)
 " [place] EMERGENCY DEPARTMENT AT [facility] Provider Note   CSN: [id number] Arrival date & time: 03/20/24  2027     Patient presents with: Chest Pain   Stacy Wolf is a 49 y.o. female.   The history is provided by the patient.  Chest Pain  She has history of hypertension, hyperlipidemia, bipolar disorder, GERD and comes in because of chest pain which started this afternoon.  She woke up and felt lightheaded and nauseous, but showered and went to work.  At work, she developed a tight feeling in her chest which radiated down her left arm.  She did break out in a sweat but she denies dyspnea.  Symptoms are still present, she has never had anything like this before.  Nothing made the pain better and nothing made it worse.  It was not pleuritic and not affected by exertion.  She has been having a dry cough for about the last 3 weeks which is unchanged today.  She does smoke Black and mild cigars, and there is a positive family history of premature coronary atherosclerosis.    Prior to Admission medications  Medication Sig Start Date End Date Taking? Authorizing Provider  amLODipine  (NORVASC ) 5 MG tablet Take 1 tablet (5 mg total) by mouth daily. 08/12/23   Dettinger, Fonda LABOR, MD  amoxicillin -clavulanate (AUGMENTIN ) 875-125 MG tablet Take 1 tablet by mouth 2 (two) times daily. 03/04/24   Dettinger, Fonda LABOR, MD  estradiol  (ESTRACE ) 2 MG tablet TAKE 1 TABLET BY MOUTH EVERY DAY 02/17/24   Dettinger, Fonda LABOR, MD  fluticasone  (FLONASE ) 50 MCG/ACT nasal spray Place 1 spray into both nostrils 2 (two) times daily as needed for allergies or rhinitis. 02/21/22   Dettinger, Fonda LABOR, MD  gabapentin  (NEURONTIN ) 300 MG capsule Take 1 capsule (300 mg total) by mouth 3 (three) times daily. 08/28/23   Margrette Taft BRAVO, MD  ibuprofen  (ADVIL ) 800 MG tablet TAKE 1 TABLET BY MOUTH EVERY 8 HOURS AS NEEDED 11/29/23   Onesimo Oneil LABOR, MD  lansoprazole  (PREVACID  SOLUTAB) 15 MG disintegrating tablet  Take by mouth.    [provider]  lisinopril  (ZESTRIL ) 5 MG tablet Take 1 tablet (5 mg total) by mouth daily. 08/12/23   Dettinger, Fonda LABOR, MD  methocarbamol  (ROBAXIN ) 750 MG tablet Take 1 tablet (750 mg total) by mouth 4 (four) times daily. 08/23/23   Margrette Taft BRAVO, MD  rosuvastatin  (CRESTOR ) 5 MG tablet Take 1 tablet (5 mg total) by mouth daily. 08/16/23   Dettinger, Fonda LABOR, MD  tiZANidine  (ZANAFLEX ) 4 MG tablet Take 1 tablet (4 mg total) by mouth every 6 (six) hours as needed for muscle spasms. 08/28/23 08/27/24  Margrette Taft BRAVO, MD    Allergies: Imitrex  [sumatriptan ], Latuda [lurasidone hcl], Wellbutrin [bupropion], Zoloft [sertraline hcl], Xanax [alprazolam], Haloperidol and related, and Nicoderm [nicotine]    Review of Systems  Cardiovascular:  Positive for chest pain.  All other systems reviewed and are negative.   Updated Vital Signs BP 118/83   Pulse 76   Temp 98.3 F (36.8 C)   Resp (!) 27   Ht 5' 3 (1.6 m)   Wt 84.4 kg   LMP 08/10/2016 Comment: [facility]  SpO2 99%   BMI 32.95 kg/m   Physical Exam Vitals and nursing note reviewed.   49 year old female, resting comfortably and in no acute distress. Vital signs are significant for mildly elevated diastolic blood pressure. Oxygen  saturation is 99%, which is normal. Head is normocephalic and  atraumatic. PERRLA, EOMI. Oropharynx is clear. Neck is nontender and supple without adenopathy. Lungs are clear without rales, wheezes, or rhonchi.  Slight wheezing is noted with forced exhalation. Chest is nontender. Heart has regular rate and rhythm without murmur. Abdomen is soft, flat, nontender. Extremities have no cyanosis or edema, full range of motion is present. Skin is warm and dry without rash. Neurologic: Mental status is normal, cranial nerves are intact, moves all extremities equally.  (all labs ordered are listed, but only abnormal results are displayed) Labs Reviewed  BASIC METABOLIC PANEL WITH GFR -  Abnormal; Notable for the following components:      Result Value   Calcium  8.7 (*)    All other components within normal limits  CBC  TROPONIN T, HIGH SENSITIVITY  TROPONIN T, HIGH SENSITIVITY    EKG: EKG Interpretation Date/Time:  Friday March 20 2024 21:10:17 EST Ventricular Rate:  79 PR Interval:  137 QRS Duration:  73 QT Interval:  388 QTC Calculation: 445 R Axis:   58  Text Interpretation: Sinus rhythm Low voltage, precordial leads Otherwise within normal limits When compared with ECG of 09/15/2021, No significant change was found Confirmed by Raford Lenis ([id number]) on 03/20/2024 11:16:37 PM  Radiology: [facility] Chest Portable 1 View Result Date: 03/20/2024 EXAM: 1 VIEW(S) XRAY OF THE CHEST 03/20/2024 09:15:42 PM COMPARISON: 03/05/2016 CLINICAL HISTORY: cp cp cp FINDINGS: LUNGS AND PLEURA: No focal pulmonary opacity. No pleural effusion. No pneumothorax. HEART AND MEDIASTINUM: No acute abnormality of the cardiac and mediastinal silhouettes. BONES AND SOFT TISSUES: No acute osseous abnormality. IMPRESSION: 1. No acute cardiopulmonary pathology. Electronically signed by: Franky Crease MD 03/20/2024 09:18 PM EST RP Workstation: [id number]   Cardiac monitor shows normal sinus rhythm, per my interpretation.  Procedures   Medications Ordered in the ED  ipratropium-albuterol  (DUONEB) 0.5-2.5 (3) MG/3ML nebulizer solution 3 mL (has no administration in time range)  aspirin  chewable tablet 324 mg (324 mg Oral Given 03/20/24 2327)                HEART Score: 2                    Medical Decision Making Amount and/or Complexity of Data Reviewed Labs: ordered. Radiology: ordered.  Risk OTC drugs. Prescription drug management.   Chest pain of uncertain cause.  Differential diagnosis includes, but is not limited to, ACS, pulmonary embolism, bronchitis, pneumonia, GERD.  She has no risk factors for pulmonary embolism.  I have reviewed her electrocardiogram, and my interpretation is low  voltage and otherwise normal ECG and unchanged from prior.  Chest x-ray shows no evidence of pneumonia.  Have independently viewed the image, and agree with radiologist's interpretation.  I have reviewed her laboratory tests, and my interpretation is normal CBC, normal basic metabolic panel, normal troponin with repeat troponin pending.  I ordered a dose of aspirin  and therapeutic trial of albuterol  with ipratropium.  Heart score is 2, which puts her at low risk for major adverse cardiac events in the next 6 weeks.  I have reviewed her past records, and do note ED visit on 03/05/2016 for acute bronchitis.  Repeat troponin is unchanged.  She had no relief of symptoms whatsoever with albuterol  with ipratropium.  However, she states that she does feel like she is back to her baseline.  I am discharging her with instructions to follow-up with primary care provider.     Final diagnoses:  Nonspecific chest pain    ED Discharge Orders  None          Raford Lenis, MD 03/21/24 0102  "

## 2024-03-20 NOTE — ED Notes (Signed)
 Patient drinking soda Claims she feels better

## 2024-03-20 NOTE — ED Triage Notes (Addendum)
 Pt from home complains of feeling weak and having CP with pain radiating to left arm, also endorses SOB. Pain started around 630pm, pt had not felt any pain before then. Pt aaox3 and ambulatory. Take BP, and cholesterol medication. Also endorsing nausea.

## 2024-03-21 NOTE — ED Notes (Signed)
 Ambulatory to restroom

## 2024-03-21 NOTE — Discharge Instructions (Signed)
 Your evaluation did not show a cause for your chest pain, but did not show any sign of any serious problems.  Please return if symptoms are worsening, otherwise follow-up with your primary care provider.

## 2024-03-25 ENCOUNTER — Telehealth: Payer: Self-pay

## 2024-03-25 ENCOUNTER — Ambulatory Visit: Attending: Orthopedic Surgery

## 2024-03-25 NOTE — Telephone Encounter (Signed)
 Pt no-showed for OT appointment today. Therefore, OT called pt's listed phone number [phone]). OT left voicemail, this was pt's last scheduled appt, informed pt of this and to call back if needing further appts. If no call back by the end of the day, will discharge.  Rocky Dutch, OTR/L

## 2024-03-30 ENCOUNTER — Encounter: Admitting: Orthopedic Surgery

## 2024-04-03 ENCOUNTER — Ambulatory Visit: Admitting: Orthopedic Surgery

## 2024-04-03 ENCOUNTER — Encounter: Payer: Self-pay | Admitting: Orthopedic Surgery

## 2024-04-03 DIAGNOSIS — M6752 Plica syndrome, left knee: Secondary | ICD-10-CM

## 2024-04-03 MED ORDER — METHYLPREDNISOLONE ACETATE 40 MG/ML IJ SUSP
40.0000 mg | Freq: Once | INTRAMUSCULAR | Status: AC
  Administered 2024-04-03: 40 mg via INTRA_ARTICULAR

## 2024-04-03 NOTE — Progress Notes (Signed)
" ° ° °  04/03/2024   Chief Complaint  Patient presents with   Knee Pain    LEFT    No diagnosis found.  What pharmacy do you use ? ____[facility] _______________________  DOI/DOS/ Date:    Did you get better, worse or no change (Answer below)   Worse feels like it is getting worse/ locking up     "

## 2024-04-03 NOTE — Progress Notes (Signed)
" ° °  Patient: Stacy Wolf           Date of Birth: [birth year]           MRN: [id number] Visit Date: 04/03/2024 Requested by: Maryanne Fonda LABOR, MD [address] [place],  [place] [address] PCP: Dettinger, Fonda LABOR, MD  Encounter Diagnosis  Name Primary?   Plica syndrome of knee, left Yes    Assessment and plan:  49 year old female with presumed plica over the lateral patellofemoral joint  Recommend cortisone injection  Economy hinged brace  Recheck in 4 weeks  If no improvement she will probably need arthroscopic evaluation and plica resection  Meds ordered this encounter  Medications   methylPREDNISolone  acetate (DEPO-MEDROL ) injection 40 mg     Chief Complaint  Patient presents with   Knee Pain    LEFT    History:  49 year old female recurrent left knee pain.  She had an MRI and x-rays in 2023 which were normal.  She complains of locking lateral knee pain and a feeling like the knee will give way  It locks when she is sleeping and the knee has been bent and she has to get someone to straighten it, she also has locking episodes when walking at work where the knee will not bend and will hyperextend    Focused exam findings:  Right knee examination the knee does hyperextend but this is normal for her the cruciates are intact she has tenderness over the lateral femoral condyle and a clicking on patellofemoral mobilization with a negative McMurray's and no effusion  No results found.   We did inject today we placed her in an economy hinged brace 4-week follow-up  Procedure note left knee injection   verbal consent was obtained to inject left knee joint  Timeout was completed to confirm the site of injection  The medications used were depomedrol 40 mg and 1% lidocaine  3 cc Anesthesia was provided by ethyl chloride and the skin was prepped with alcohol.  After cleaning the skin with alcohol a 20-gauge needle was used to inject the left knee joint. There were no  complications. A sterile bandage was applied.   "

## 2024-04-03 NOTE — Patient Instructions (Signed)
 Wear brace when walking     Joint Steroid Injection A joint steroid injection is a procedure to relieve swelling and pain in a joint. Steroids are medicines that reduce inflammation. In this procedure, your health care provider uses a syringe and a needle to inject a steroid medicine into a painful and inflamed joint. A pain-relieving medicine (anesthetic) may be injected along with the steroid. In some cases, your health care provider may use an imaging technique such as ultrasound or fluoroscopy to guide the injection. Joints that are often treated with steroid injections include the knee, shoulder, hip, and spine. These injections may also be used in the elbow, ankle, and joints of the hands or feet. You may have joint steroid injections as part of your treatment for inflammation caused by: Gout. Rheumatoid arthritis. Advanced wear-and-tear arthritis (osteoarthritis). Tendinitis. Bursitis. Joint steroid injections may be repeated, but having them too often can damage a joint or the skin over the joint. You should not have joint steroid injections less than 6 weeks apart or more than four times a year. Tell a health care provider about: Any allergies you have. All medicines you are taking, including vitamins, herbs, eye drops, creams, and over-the-counter medicines. Any problems you or family members have had with anesthetic medicines. Any blood disorders you have. Any surgeries you have had. Any medical conditions you have. Whether you are pregnant or may be pregnant. What are the risks? Generally, this is a safe treatment. However, problems may occur, including: Infection. Bleeding. Allergic reactions to medicines. Damage to the joint or tissues around the joint. Thinning of skin or loss of skin color over the joint. Temporary flushing of the face or chest. Temporary increase in pain. Temporary increase in blood sugar. Failure to relieve inflammation or pain. What happens before  the treatment? Medicines Ask your health care provider about: Changing or stopping your regular medicines. This is especially important if you are taking diabetes medicines or blood thinners. Taking medicines such as aspirin  and ibuprofen . These medicines can thin your blood. Do not take these medicines unless your health care provider tells you to take them. Taking over-the-counter medicines, vitamins, herbs, and supplements. General instructions You may have imaging tests of your joint. Ask your health care provider if you can drive yourself home after the procedure. What happens during the treatment?  Your health care provider will position you for the injection and locate the injection site over your joint. The skin over the joint will be cleaned with a germ-killing soap. Your health care provider may: Spray a numbing solution (topical anesthetic) over the injection site. Inject a local anesthetic under the skin above your joint. The needle will be placed through your skin into your joint. Your health care provider may use imaging to guide the needle to the right spot for the injection. If imaging is used, a special contrast dye may be injected to confirm that the needle is in the correct location. The steroid medicine will be injected into your joint. Anesthetic may be injected along with the steroid. This may be a medicine that relieves pain for a short time (short-acting anesthetic) or for a longer time (long-acting anesthetic). The needle will be removed, and an adhesive bandage (dressing) will be placed over the injection site. The procedure may vary among health care providers and hospitals. What can I expect after the treatment? You will be able to go home after the treatment. It is normal to feel slight flushing for a few days  after the injection. After the treatment, it is common to have an increase in joint pain after the anesthetic has worn off. This may happen about an hour  after a short-acting anesthetic or about 8 hours after a longer-acting anesthetic. You should begin to feel relief from joint pain and swelling after 24 to 48 hours. Contact your health care provider if you do not begin to feel relief after 2 days. Follow these instructions at home: Injection site care Leave the adhesive dressing over your injection site in place until your health care provider says you can remove it. Check your injection site every day for signs of infection. Check for: More redness, swelling, or pain. Fluid or blood. Warmth. Pus or a bad smell. Activity Return to your normal activities as told by your health care provider. Ask your health care provider what activities are safe for you. You may be asked to limit activities that put stress on the joint for a few days. Do joint exercises as told by your health care provider. Do not take baths, swim, or use a hot tub until your health care provider approves. Ask your health care provider if you may take showers. You may only be allowed to take sponge baths. Managing pain, stiffness, and swelling  If directed, put ice on the joint. To do this: Put ice in a plastic bag. Place a towel between your skin and the bag. Leave the ice on for 20 minutes, 2-3 times a day. Remove the ice if your skin turns bright red. This is very important. If you cannot feel pain, heat, or cold, you have a greater risk of damage to the area. Raise (elevate) your joint above the level of your heart when you are sitting or lying down. General instructions Take over-the-counter and prescription medicines only as told by your health care provider. Do not use any products that contain nicotine or tobacco, such as cigarettes, e-cigarettes, and chewing tobacco. These can delay joint healing. If you need help quitting, ask your health care provider. If you have diabetes, be aware that your blood sugar may be slightly elevated for several days after the  injection. Keep all follow-up visits. This is important. Contact a health care provider if you have: Chills or a fever. Any signs of infection at your injection site. Increased pain or swelling or no relief after 2 days. Summary A joint steroid injection is a treatment to relieve pain and swelling in a joint. Steroids are medicines that reduce inflammation. Your health care provider may add an anesthetic along with the steroid. You may have joint steroid injections as part of your arthritis treatment. Joint steroid injections may be repeated, but having them too often can damage a joint or the skin over the joint. Contact your health care provider if you have a fever, chills, or signs of infection, or if you get no relief from joint pain or swelling. This information is not intended to replace advice given to you by your health care provider. Make sure you discuss any questions you have with your health care provider. Document Revised: 07/31/2019 Document Reviewed: 07/31/2019 Elsevier Patient Education  2024 [employer].

## 2024-04-07 ENCOUNTER — Ambulatory Visit (INDEPENDENT_AMBULATORY_CARE_PROVIDER_SITE_OTHER): Admitting: Nurse Practitioner

## 2024-04-07 ENCOUNTER — Encounter: Payer: Self-pay | Admitting: Nurse Practitioner

## 2024-04-07 VITALS — BP 130/81 | HR 76 | Temp 97.1°F | Ht 63.0 in | Wt 189.0 lb

## 2024-04-07 DIAGNOSIS — Z09 Encounter for follow-up examination after completed treatment for conditions other than malignant neoplasm: Secondary | ICD-10-CM

## 2024-04-07 DIAGNOSIS — R079 Chest pain, unspecified: Secondary | ICD-10-CM

## 2024-04-07 NOTE — Progress Notes (Signed)
" ° °  Subjective:    Patient ID: Stacy Wolf, female    DOB: [birth year], 49 y.o.   MRN: [id number]   Chief Complaint: ED follow up  HPI Patient went to the ED with the following complaint:She woke up and felt lightheaded and nauseous, but showered and went to work. At work, she developed a tight feeling in her chest which radiated down her left arm. She did break out in a sweat but she denies dyspnea. Symptoms are still present, she has never had anything like this before. EKG- NSR, troponins were negative and chest xray was normal. SHe returned to baseline on her own. She was discharge home with no definitive diagnosis.  Since being home she has been good. Has been taking and aspirin  1x a day. Still having occasional chest tightness.  Patient Active Problem List   Diagnosis Date Noted   Status post partial hysterectomy 08/19/2020   Current use of estrogen therapy 01/20/2019   Posttraumatic stress disorder 10/17/2016   Personality disorder (HCC) 10/17/2016   Obesity (BMI 30.0-34.9) 04/23/2016   Essential hypertension, benign 01/16/2016   Migraine headache 04/18/2015   Tobacco abuse 02/02/2015   Gastroesophageal reflux disease without esophagitis 08/24/2014   Hyperlipidemia LDL goal <100 09/14/2013   Bipolar disorder (HCC) 08/25/2013       Review of Systems  Constitutional:  Negative for chills, fatigue and fever.  Respiratory:  Negative for cough and shortness of breath.   Cardiovascular:  Positive for chest pain.       Objective:   Physical Exam Constitutional:      Appearance: Normal appearance.  Cardiovascular:     Rate and Rhythm: Normal rate and regular rhythm.     Pulses: Normal pulses.     Heart sounds: Normal heart sounds.  Pulmonary:     Effort: Pulmonary effort is normal.     Breath sounds: Normal breath sounds.  Skin:    General: Skin is warm.  Neurological:     General: No focal deficit present.     Mental Status: She is alert and oriented to person,  place, and time.  Psychiatric:        Mood and Affect: Mood normal.        Behavior: Behavior normal.    BP 130/81   Pulse 76   Temp (!) 97.1 F (36.2 C) (Temporal)   Ht 5' 3 (1.6 m)   Wt 189 lb (85.7 kg)   LMP 08/10/2016 Comment: [facility]  SpO2 97%   BMI 33.48 kg/m         Assessment & Plan:   Stacy Wolf in today with chief complaint of No chief complaint on file.   1. Chest pain, unspecified type (Primary) Dash diet Avoid strenuous activity - Ambulatory referral to Cardiology  2. Hospital discharge follow-up Hospital records reviewed    The above assessment and management plan was discussed with the patient. The patient verbalized understanding of and has agreed to the management plan. Patient is aware to call the clinic if symptoms persist or worsen. Patient is aware when to return to the clinic for a follow-up visit. Patient educated on when it is appropriate to go to the emergency department.   Mary-Margaret Gladis, FNP   "

## 2024-04-07 NOTE — Patient Instructions (Signed)
 Chest Pain (Angina): What to Know Angina is pain or discomfort in the chest. It can also be felt in the neck, arm, jaw, or back. Angina is caused by not having enough blood flow to the heart wall. Angina may be a warning that you're at risk for having a heart attack. What are the causes? Angina is most often caused by build-up of plaque in your arteries that makes it hard for blood to flow. Plaque narrows and blocks the arteries of the heart. Plaque is made of fats and cholesterol. Angina is also caused by: Sudden spasms of the muscles in the arteries of the heart. Small artery disease. Heart valve problems. A tear in an artery of your heart. Weakness of the heart muscle. What increases the risk? Main risks Having high cholesterol. High blood pressure. Having diabetes. Family history of heart disease. Not exercising or moving enough. Having had radiation treatment to the left side of your chest. Other risks Using tobacco products. Being very overweight. Eating foods that have a lot of unhealthy fats. Feeling stressed or having depression. Using drugs, such as cocaine. What are the signs or symptoms? Symptoms in all people Chest pain, which may: Feel like a crushing or squeezing in the chest. Feel like a tightness, pressure, or heaviness in the chest. Last for more than a few minutes at a time. Stop and come back. Pain in the neck, arm, jaw, or back. Heartburn or upset stomach for no reason. Being short of breath. Feeling like you may throw up. Sudden cold sweats. Other symptoms in females Tiredness or weakness. Worry and anxiety. Dizziness or fainting. How is this diagnosed?  Your symptoms and medical history. Blood tests. Electrocardiogram (ECG) to measure the electrical activity of your heart. Stress test to look for signs of a blocked artery. CT angiogram to examine your heart and the blood flow to it. Coronary angiogram to check for a blocked artery. How is this  treated? Medicines to: Prevent blood clots. Relax blood vessels and improve blood flow to the heart. Lower blood pressure. Reduce cholesterol. You may have a procedure called angioplasty to widen a narrowed or blocked artery. A small mesh tube called a stent may be put in the artery to keep it open. Surgery may be needed to allow blood to go around a blocked artery. Follow these instructions at home: Medicines Take your medicines only as told. Do not take these medicines unless your provider says that you can: NSAIDs, such as ibuprofen and naproxen. Supplements that contain vitamin A, vitamin E, or both. Hormone therapy that contains estrogen with or without progestin. Eating and drinking  Eat a healthy diet that includes: Lots of fresh fruits and vegetables. Whole grains. Low-fat protein. Low-fat dairy products. Follow instructions about what you may eat and drink. Activity Exercise as told. Talk with your provider about doing a program called cardiac rehab to help make your heart strong. When you feel tired, take a break. Plan breaks if you know you're going to feel tired. Lifestyle Do not smoke, vape, or use nicotine or tobacco. If your provider says you can drink alcohol: Limit how much you have to: 0-1 drink a day if you're female and not pregnant. 0-2 drinks a day if you're female. Know how much alcohol is in your drink. In the [place] one drink is one 12 oz bottle of beer (355 mL), one 5 oz glass of wine (148 mL), or one 1 oz glass of hard liquor (44 mL). General instructions  Stay at a healthy weight. If told to lose weight, work with your provider to lose weight safely. Keep your vaccines up to date. Get a flu shot every year. Learn to manage stress. If you need help, ask your provider. Talk with your provider if you feel depressed. Work with your provider to manage any other health problems that you have. These may include diabetes or high blood pressure. Keep all  follow-up visits. Your provider will want to check on your condition. Get help right away if: You have pain in your chest, neck, arm, jaw, or back, and the pain: Happens more often. Lasts more than a few minutes. Goes away and comes back. Does not get better after you take medicine under your tongue. You're dizzy or light-headed all of a sudden. You faint. You have any combination of these problems: Cold sweats. Heartburn or upset stomach. Trouble breathing. Feeling like you may throw up, or you throw up. Feeling very tired or weak. Feeling worried or nervous. These symptoms may be an emergency. Call 911 right away. Do not wait to see if the symptoms will go away. Do not drive yourself to the hospital. This information is not intended to replace advice given to you by your health care provider. Make sure you discuss any questions you have with your health care provider. Document Revised: 01/01/2023 Document Reviewed: 07/15/2022 [employer] Patient Education  2024 [employer].

## 2024-05-01 ENCOUNTER — Ambulatory Visit: Admitting: Orthopedic Surgery
